# Patient Record
Sex: Female | Born: 1945
Health system: Southern US, Community
[De-identification: ages and names within clinical notes are randomized; demographics above are authoritative.]

## PROBLEM LIST (undated history)

## (undated) DIAGNOSIS — F32A Depression, unspecified: Secondary | ICD-10-CM

## (undated) DIAGNOSIS — F419 Anxiety disorder, unspecified: Secondary | ICD-10-CM

## (undated) DIAGNOSIS — E039 Hypothyroidism, unspecified: Secondary | ICD-10-CM

## (undated) DIAGNOSIS — F329 Major depressive disorder, single episode, unspecified: Secondary | ICD-10-CM

## (undated) HISTORY — DX: Major depressive disorder, single episode, unspecified: F32.9

## (undated) HISTORY — DX: Hypothyroidism, unspecified: E03.9

## (undated) HISTORY — DX: Depression, unspecified: F32.A

## (undated) HISTORY — DX: Anxiety disorder, unspecified: F41.9

---

## 2000-09-07 ENCOUNTER — Encounter: Admission: RE | Admit: 2000-09-07 | Discharge: 2000-09-07 | Payer: Self-pay | Admitting: *Deleted

## 2000-09-07 ENCOUNTER — Encounter: Payer: Self-pay | Admitting: *Deleted

## 2000-09-15 ENCOUNTER — Encounter: Payer: Self-pay | Admitting: General Surgery

## 2000-09-16 ENCOUNTER — Encounter: Payer: Self-pay | Admitting: General Surgery

## 2000-09-16 ENCOUNTER — Observation Stay (HOSPITAL_COMMUNITY): Admission: RE | Admit: 2000-09-16 | Discharge: 2000-09-17 | Payer: Self-pay | Admitting: General Surgery

## 2000-09-19 ENCOUNTER — Ambulatory Visit (HOSPITAL_COMMUNITY): Admission: RE | Admit: 2000-09-19 | Discharge: 2000-09-19 | Payer: Self-pay | Admitting: Gastroenterology

## 2000-09-19 ENCOUNTER — Encounter: Payer: Self-pay | Admitting: Gastroenterology

## 2004-05-29 ENCOUNTER — Ambulatory Visit: Payer: Self-pay | Admitting: Family Medicine

## 2004-07-08 ENCOUNTER — Ambulatory Visit: Payer: Self-pay | Admitting: Family Medicine

## 2013-10-20 ENCOUNTER — Emergency Department (HOSPITAL_COMMUNITY): Payer: Medicare PPO

## 2013-10-20 ENCOUNTER — Encounter (HOSPITAL_COMMUNITY): Payer: Self-pay | Admitting: Emergency Medicine

## 2013-10-20 ENCOUNTER — Emergency Department (HOSPITAL_COMMUNITY)
Admission: EM | Admit: 2013-10-20 | Discharge: 2013-10-20 | Disposition: A | Discharge status: Home / Self Care | Payer: Medicare PPO | Attending: Emergency Medicine | Admitting: Emergency Medicine

## 2013-10-20 DIAGNOSIS — Y9389 Activity, other specified: Secondary | ICD-10-CM | POA: Insufficient documentation

## 2013-10-20 DIAGNOSIS — Z7982 Long term (current) use of aspirin: Secondary | ICD-10-CM | POA: Insufficient documentation

## 2013-10-20 DIAGNOSIS — S298XXA Other specified injuries of thorax, initial encounter: Secondary | ICD-10-CM | POA: Insufficient documentation

## 2013-10-20 DIAGNOSIS — S32509A Unspecified fracture of unspecified pubis, initial encounter for closed fracture: Secondary | ICD-10-CM | POA: Insufficient documentation

## 2013-10-20 DIAGNOSIS — Y92410 Unspecified street and highway as the place of occurrence of the external cause: Secondary | ICD-10-CM | POA: Insufficient documentation

## 2013-10-20 DIAGNOSIS — S32599A Other specified fracture of unspecified pubis, initial encounter for closed fracture: Secondary | ICD-10-CM

## 2013-10-20 DIAGNOSIS — Z79899 Other long term (current) drug therapy: Secondary | ICD-10-CM | POA: Insufficient documentation

## 2013-10-20 DIAGNOSIS — R269 Unspecified abnormalities of gait and mobility: Secondary | ICD-10-CM | POA: Insufficient documentation

## 2013-10-20 MED ORDER — DOCUSATE SODIUM 100 MG PO CAPS: 100.0000 mg | ORAL_CAPSULE | Freq: Two times a day (BID) | ORAL | Status: DC | PRN

## 2013-10-20 MED ORDER — HYDROCODONE-ACETAMINOPHEN 5-325 MG PO TABS: 2.0000 | ORAL_TABLET | ORAL | Status: DC | PRN

## 2013-10-20 MED ORDER — ONDANSETRON 4 MG PO TBDP: 4.0000 mg | ORAL_TABLET | Freq: Three times a day (TID) | ORAL | Status: DC | PRN

## 2013-10-20 MED ORDER — HYDROCODONE-ACETAMINOPHEN 5-325 MG PO TABS
1.0000 | ORAL_TABLET | Freq: Once | ORAL | Status: AC
  Administered 2013-10-20: 1 via ORAL
  Filled 2013-10-20: qty 1

## 2013-10-20 MED ORDER — ONDANSETRON 4 MG PO TBDP
4.0000 mg | ORAL_TABLET | Freq: Once | ORAL | Status: AC
  Administered 2013-10-20: 4 mg via ORAL
  Filled 2013-10-20: qty 1
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
## 2013-10-20 NOTE — ED Notes (Signed)
Wound on R knee dressed with bacitracin, telfa gauze and band aid.

## 2013-10-20 NOTE — Discharge Instructions (Signed)
Stable Pelvic Fracture, Adult °You have one or more fractures (this means there is a break in the bones) of the pelvis. The pelvis is the ring of bones that make up your hipbones. These are the bones you sit on and the lower part of the spine. It is like a boney ring where your legs attach and which supports your upper body. You have an un-displaced fracture. This means the bones are in good position. The pelvic fracture you have is a simple (uncomplicated) fracture. °DIAGNOSIS  °X-rays usually diagnose these fractures. °TREATMENT  °The goals of treating pelvic fractures are to get the bones to heal in a good position. The patient should return to normal activities as soon as possible. Such fractures are often treated with normal bed rest and conservative measures.  °HOME CARE INSTRUCTIONS  °· You should be on bed rest for as long as directed by your caregiver. Change positions of your legs every 1-2 hours to maintain good blood flow. You may sit as long as is tolerable. Following this, you may do usual activities, but avoid strenuous activities for as long as directed by your caregiver. °· Only take over-the-counter or prescription medicines for pain, discomfort, or fever as directed by your caregiver. °· Bed-rest may also be used for discomfort. °· Resume your activities when you are able. Use a cain or crutch on the injured side to reduce pain while walking, as needed. °· If you develop increased pain or discomfort not relieved with medications, contact your caregiver. °· Warning: Do not drive a car or operate a motor vehicle until your caregiver specifically tells you it is safe to do so. °SEEK IMMEDIATE MEDICAL CARE IF:  °· You feel light-headed or faint, develop chest pain or shortness of breath. °· An unexplained oral temperature above 102° F (38.9° C) develops. °· You develop blood in the urine or in the stools. °· There is difficulty urinating, and/or having a bowel movement, or pain with these  efforts. °· There is a difficulty or increased pain with walking. °· There is swelling in one or both legs that is not normal. °Document Released: 11/15/2001 Document Revised: 05/09/2013 Document Reviewed: 04/19/2008 °Exit[employer]® Patient Information ©2014 [employer]. ° °

## 2013-10-20 NOTE — ED Notes (Signed)
Arrangements being made for pt to have walker, BSC, and wheelchair from home health equipment.
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
## 2013-10-20 NOTE — Care Management (Signed)
Patient has [employer] [place] with patients Daughter to let her know that [facility] is out of Network with the Patients insurance for DME but we could still provide it would just be at a higher co-pay. Patients Daughter will discuss this with Patient and  with ED CM whether to proceed with [facility] or to send the Referral to [facility] who holds the [employer]. [facility] fax number was provided if patient Chose's our DME Services. Home Health orders have been received and [facility] will provide.

## 2013-11-30 ENCOUNTER — Ambulatory Visit
Admission: RE | Admit: 2013-11-30 | Discharge: 2013-11-30 | Disposition: A | Discharge status: Home / Self Care | Payer: Commercial Managed Care - HMO | Source: Ambulatory Visit | Attending: Family Medicine | Admitting: Family Medicine

## 2013-11-30 ENCOUNTER — Other Ambulatory Visit: Payer: Self-pay | Admitting: Family Medicine

## 2013-11-30 DIAGNOSIS — S329XXA Fracture of unspecified parts of lumbosacral spine and pelvis, initial encounter for closed fracture: Secondary | ICD-10-CM

## 2013-12-27 ENCOUNTER — Ambulatory Visit
Admission: RE | Admit: 2013-12-27 | Discharge: 2013-12-27 | Disposition: A | Discharge status: Home / Self Care | Payer: Commercial Managed Care - HMO | Source: Ambulatory Visit | Attending: Family Medicine | Admitting: Family Medicine

## 2013-12-27 ENCOUNTER — Other Ambulatory Visit: Payer: Self-pay | Admitting: Family Medicine

## 2013-12-27 DIAGNOSIS — R071 Chest pain on breathing: Secondary | ICD-10-CM

## 2013-12-27 DIAGNOSIS — S329XXA Fracture of unspecified parts of lumbosacral spine and pelvis, initial encounter for closed fracture: Secondary | ICD-10-CM

## 2014-09-23 DIAGNOSIS — T7840XA Allergy, unspecified, initial encounter: Secondary | ICD-10-CM | POA: Diagnosis not present

## 2014-09-24 DIAGNOSIS — T7840XD Allergy, unspecified, subsequent encounter: Secondary | ICD-10-CM | POA: Diagnosis not present

## 2014-10-18 DIAGNOSIS — H2513 Age-related nuclear cataract, bilateral: Secondary | ICD-10-CM | POA: Diagnosis not present

## 2014-10-18 DIAGNOSIS — H04123 Dry eye syndrome of bilateral lacrimal glands: Secondary | ICD-10-CM | POA: Diagnosis not present

## 2014-10-18 DIAGNOSIS — H04532 Neonatal obstruction of left nasolacrimal duct: Secondary | ICD-10-CM | POA: Diagnosis not present

## 2014-10-18 DIAGNOSIS — H04531 Neonatal obstruction of right nasolacrimal duct: Secondary | ICD-10-CM | POA: Diagnosis not present

## 2015-01-24 DIAGNOSIS — R739 Hyperglycemia, unspecified: Secondary | ICD-10-CM | POA: Diagnosis not present

## 2015-01-24 DIAGNOSIS — H578 Other specified disorders of eye and adnexa: Secondary | ICD-10-CM | POA: Diagnosis not present

## 2015-01-24 DIAGNOSIS — M25561 Pain in right knee: Secondary | ICD-10-CM | POA: Diagnosis not present

## 2015-01-24 DIAGNOSIS — J0101 Acute recurrent maxillary sinusitis: Secondary | ICD-10-CM | POA: Diagnosis not present

## 2015-01-24 DIAGNOSIS — J309 Allergic rhinitis, unspecified: Secondary | ICD-10-CM | POA: Diagnosis not present

## 2015-01-24 DIAGNOSIS — F419 Anxiety disorder, unspecified: Secondary | ICD-10-CM | POA: Diagnosis not present

## 2015-01-24 DIAGNOSIS — E785 Hyperlipidemia, unspecified: Secondary | ICD-10-CM | POA: Diagnosis not present

## 2015-01-24 DIAGNOSIS — E039 Hypothyroidism, unspecified: Secondary | ICD-10-CM | POA: Diagnosis not present

## 2015-04-10 ENCOUNTER — Ambulatory Visit (INDEPENDENT_AMBULATORY_CARE_PROVIDER_SITE_OTHER): Payer: Worker's Compensation | Admitting: Family Medicine

## 2015-04-10 ENCOUNTER — Ambulatory Visit: Payer: Worker's Compensation

## 2015-04-10 VITALS — BP 122/74 | HR 74 | Temp 97.6°F | Resp 17 | Ht 64.0 in | Wt 140.0 lb

## 2015-04-10 DIAGNOSIS — M25531 Pain in right wrist: Secondary | ICD-10-CM | POA: Diagnosis not present

## 2015-04-10 DIAGNOSIS — W19XXXA Unspecified fall, initial encounter: Secondary | ICD-10-CM

## 2015-04-10 DIAGNOSIS — M25511 Pain in right shoulder: Secondary | ICD-10-CM

## 2015-04-10 MED ORDER — NAPROXEN 500 MG PO TBEC: 500.0000 mg | DELAYED_RELEASE_TABLET | Freq: Two times a day (BID) | ORAL | Status: DC
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
## 2015-04-10 NOTE — Patient Instructions (Signed)
Apply ice for about 15 or 20 minutes for 5 times daily to wrist and shoulder for the next 3 or 4 days  Return Monday for recheck  Out of work until you are rechecked  Naproxen 500 mg twice daily for pain and inflammation  In addition to that you can take Tylenol (acetaminophen) 500 mg 2 tablets 3 times daily if needed for pain  Avoid lifting and straining, can take brace is off when showering but try to leave them on most of the time.

## 2015-04-14 ENCOUNTER — Ambulatory Visit (INDEPENDENT_AMBULATORY_CARE_PROVIDER_SITE_OTHER): Payer: Worker's Compensation | Admitting: Family Medicine

## 2015-04-14 VITALS — BP 124/76 | HR 71 | Temp 97.6°F | Resp 18 | Ht 63.0 in | Wt 142.4 lb

## 2015-04-14 DIAGNOSIS — M25531 Pain in right wrist: Secondary | ICD-10-CM

## 2015-04-14 DIAGNOSIS — S63502D Unspecified sprain of left wrist, subsequent encounter: Secondary | ICD-10-CM

## 2015-04-14 DIAGNOSIS — S46912D Strain of unspecified muscle, fascia and tendon at shoulder and upper arm level, left arm, subsequent encounter: Secondary | ICD-10-CM

## 2015-04-14 NOTE — Patient Instructions (Signed)
Continue wearing the splint  Avoid lifting and stressful rotation of the right wrist.  Take it out of the splint and do some gentle stretches of it several times a day. Referral is being made for physical therapy, but it sometimes takes a little while under [employer] do get such a referral completed.  If light duty is available patient may return to work in a task using her left hand. It sounds like she does not feel like such is going to be available from her employer, and if that is okay she will need to be left off of work.  Discontinue the naproxen  Take Tylenol 1000 mg (2500 mg) 3 times daily if needed for pain  Return in about 5 days for a recheck yet again. Hopefully it will be well enough to return to regular duty by that time.

## 2015-04-14 NOTE — Progress Notes (Signed)
  Subjective:  Patient ID: Donetta Potts, female    DOB: [birth year]  Age: 69 y.o. MRN: [id number]  69 year old lady who is here a few days ago after a fall. Continues to have pain in the right wrist and forearm. A pronation type motion hurts her from the wrist up into the forearm. 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 The pouring motion is painful to her, as is the lifting. She is very anxious about [place]. The shoulder is doing much better.  Patient did not tolerate anti-inflammatory medications which caused her a great deal of dyspepsia.   Objective:   Shoulder is not painful. The right wrist has tenderness along the joint line and in the wrist ligaments. Flexion or pronation of the wrist is painful. Grip is diminished due to the discomfort. Neurovascular intact. X-ray reports from last week were reviewed. The shoulder is normal. The wrist has a little calcification of the cartilage from old degenerative changes, no acute injuries.  Assessment & Plan:   Assessment: Right wrist pain and strain Right shoulder strain resolved  Plan: We'll proceed on to making a referral for physical therapy. They may need to do some strengthening of that wrist. Discontinue the naproxen. Tylenol for pain. Patient Instructions  Continue wearing the splint  Avoid lifting and stressful rotation of the right wrist.  Take it out of the splint and do some gentle stretches of it several times a day. Referral is being made for physical therapy, but it sometimes takes a little while under [employer] do get such a referral completed.  If light duty is available patient may return to work in a task using her left hand. It sounds like she does not feel like such is going to be available from her employer, and if that is okay she will need to be left off of work.  Discontinue the naproxen  Take Tylenol 1000 mg (2500  mg) 3 times daily if needed for pain  Return in about 5 days for a recheck yet again. Hopefully it will be well enough to return to regular duty by that time.     HOPPER,DAVID, MD 04/14/2015

## 2015-04-19 ENCOUNTER — Ambulatory Visit (INDEPENDENT_AMBULATORY_CARE_PROVIDER_SITE_OTHER): Payer: Worker's Compensation | Admitting: Family Medicine

## 2015-04-19 VITALS — BP 166/76 | HR 85 | Temp 98.3°F | Resp 16 | Ht 63.5 in | Wt 138.0 lb

## 2015-04-19 DIAGNOSIS — S40011D Contusion of right shoulder, subsequent encounter: Secondary | ICD-10-CM

## 2015-04-19 DIAGNOSIS — S60211D Contusion of right wrist, subsequent encounter: Secondary | ICD-10-CM | POA: Diagnosis not present

## 2015-04-19 DIAGNOSIS — Y990 Civilian activity done for income or pay: Secondary | ICD-10-CM | POA: Diagnosis not present
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
## 2015-05-02 DIAGNOSIS — H2513 Age-related nuclear cataract, bilateral: Secondary | ICD-10-CM | POA: Diagnosis not present

## 2015-05-02 DIAGNOSIS — H01022 Squamous blepharitis right lower eyelid: Secondary | ICD-10-CM | POA: Diagnosis not present

## 2015-05-02 DIAGNOSIS — H01025 Squamous blepharitis left lower eyelid: Secondary | ICD-10-CM | POA: Diagnosis not present

## 2015-05-02 DIAGNOSIS — H01024 Squamous blepharitis left upper eyelid: Secondary | ICD-10-CM | POA: Diagnosis not present

## 2015-05-02 DIAGNOSIS — H04123 Dry eye syndrome of bilateral lacrimal glands: Secondary | ICD-10-CM | POA: Diagnosis not present

## 2015-05-02 DIAGNOSIS — H01021 Squamous blepharitis right upper eyelid: Secondary | ICD-10-CM | POA: Diagnosis not present

## 2015-08-01 DIAGNOSIS — E785 Hyperlipidemia, unspecified: Secondary | ICD-10-CM | POA: Diagnosis not present

## 2015-08-01 DIAGNOSIS — J309 Allergic rhinitis, unspecified: Secondary | ICD-10-CM | POA: Diagnosis not present

## 2015-08-01 DIAGNOSIS — R739 Hyperglycemia, unspecified: Secondary | ICD-10-CM | POA: Diagnosis not present

## 2015-08-01 DIAGNOSIS — E039 Hypothyroidism, unspecified: Secondary | ICD-10-CM | POA: Diagnosis not present

## 2015-08-01 DIAGNOSIS — F419 Anxiety disorder, unspecified: Secondary | ICD-10-CM | POA: Diagnosis not present

## 2015-08-01 DIAGNOSIS — Z23 Encounter for immunization: Secondary | ICD-10-CM | POA: Diagnosis not present

## 2015-10-01 ENCOUNTER — Other Ambulatory Visit: Payer: Self-pay | Admitting: Family Medicine

## 2015-10-01 DIAGNOSIS — M79605 Pain in left leg: Secondary | ICD-10-CM | POA: Diagnosis not present

## 2015-10-02 ENCOUNTER — Ambulatory Visit
Admission: RE | Admit: 2015-10-02 | Discharge: 2015-10-02 | Disposition: A | Discharge status: Home / Self Care | Payer: Commercial Managed Care - HMO | Source: Ambulatory Visit | Attending: Family Medicine | Admitting: Family Medicine

## 2015-10-02 DIAGNOSIS — M25562 Pain in left knee: Secondary | ICD-10-CM | POA: Diagnosis not present

## 2015-10-02 DIAGNOSIS — M79605 Pain in left leg: Secondary | ICD-10-CM

## 2015-11-13 DIAGNOSIS — J019 Acute sinusitis, unspecified: Secondary | ICD-10-CM | POA: Diagnosis not present

## 2016-03-05 DIAGNOSIS — F419 Anxiety disorder, unspecified: Secondary | ICD-10-CM | POA: Diagnosis not present

## 2016-03-05 DIAGNOSIS — M79604 Pain in right leg: Secondary | ICD-10-CM | POA: Diagnosis not present

## 2016-03-05 DIAGNOSIS — R7301 Impaired fasting glucose: Secondary | ICD-10-CM | POA: Diagnosis not present

## 2016-03-05 DIAGNOSIS — M79605 Pain in left leg: Secondary | ICD-10-CM | POA: Diagnosis not present

## 2016-03-05 DIAGNOSIS — J309 Allergic rhinitis, unspecified: Secondary | ICD-10-CM | POA: Diagnosis not present

## 2016-03-05 DIAGNOSIS — E7800 Pure hypercholesterolemia, unspecified: Secondary | ICD-10-CM | POA: Diagnosis not present

## 2016-03-05 DIAGNOSIS — B379 Candidiasis, unspecified: Secondary | ICD-10-CM | POA: Diagnosis not present

## 2016-03-05 DIAGNOSIS — E039 Hypothyroidism, unspecified: Secondary | ICD-10-CM | POA: Diagnosis not present

## 2016-04-20 IMAGING — US US EXTREM LOW VENOUS*L*
1 series · 13 of 24 positions shown · non-contrast
Comparison: None.

CLINICAL DATA: Left popliteal region pain.



[Series 1: us extrem low venous*left* · 13 of 30 slices shown]
[im 1/30]
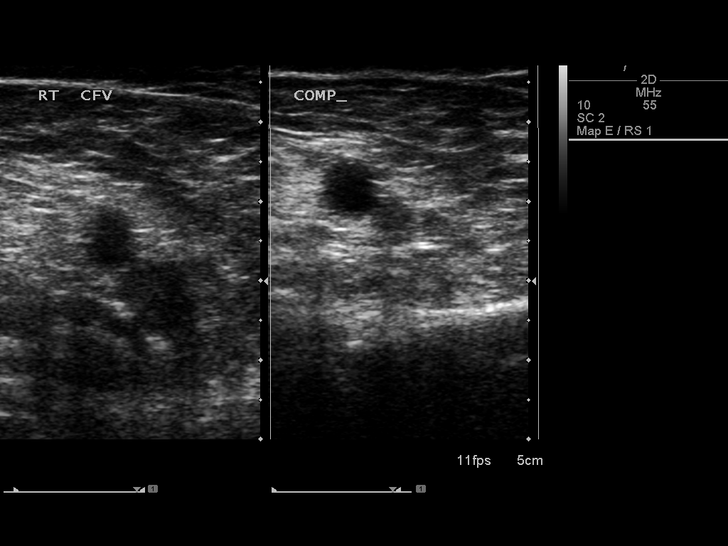
[im 3/30]
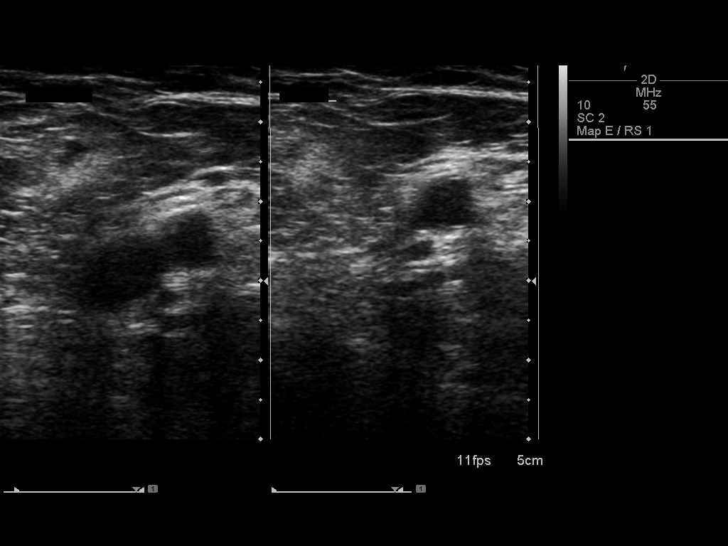
[im 6/30]
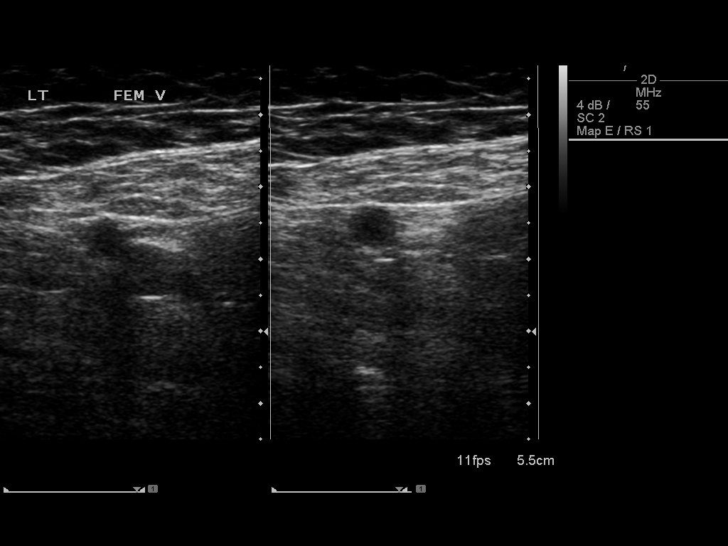
[im 8/30]
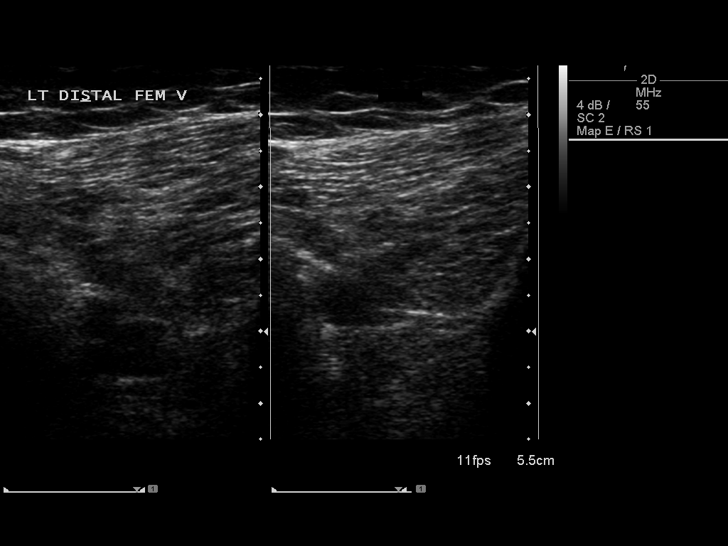
[im 11/30]
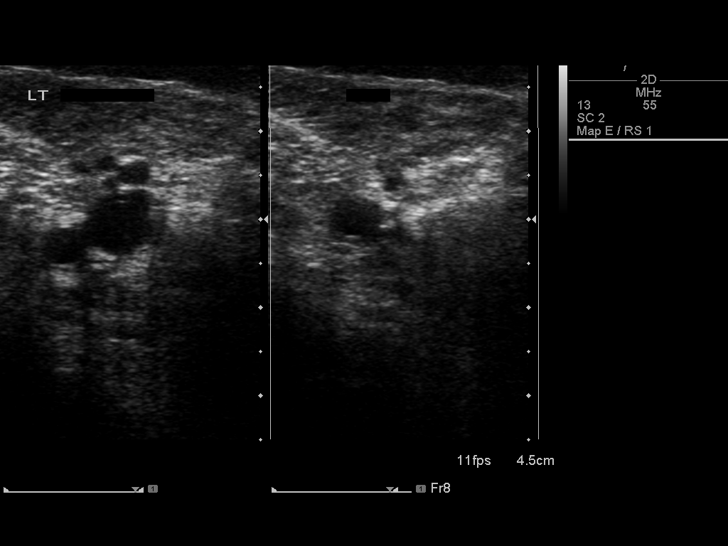
[im 13/30]
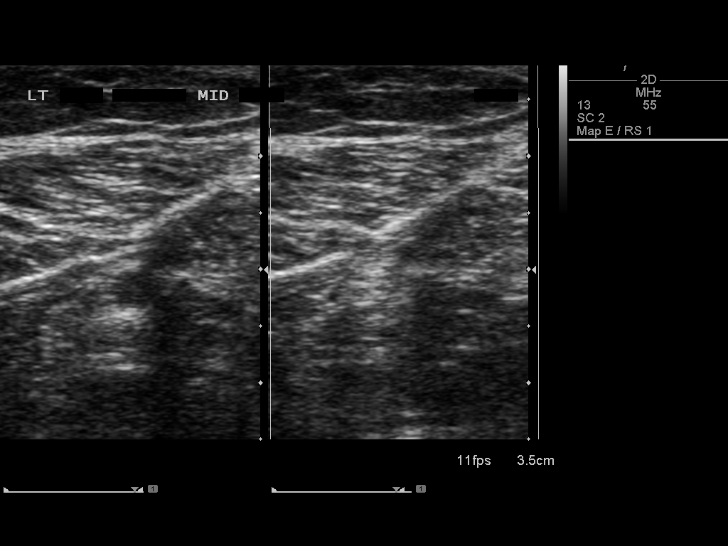
[im 16/30]
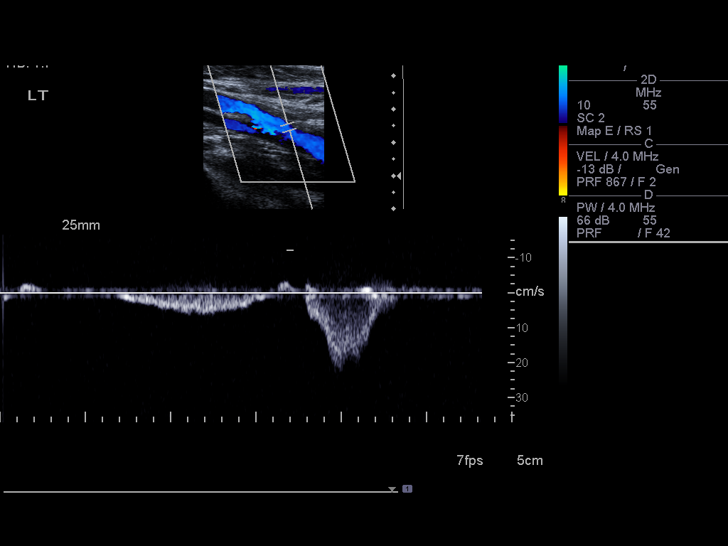
[im 17/30]
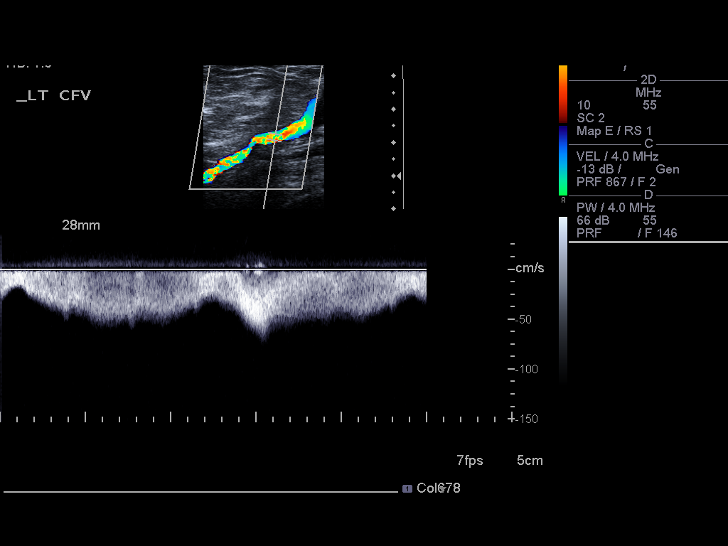
[im 19/30]
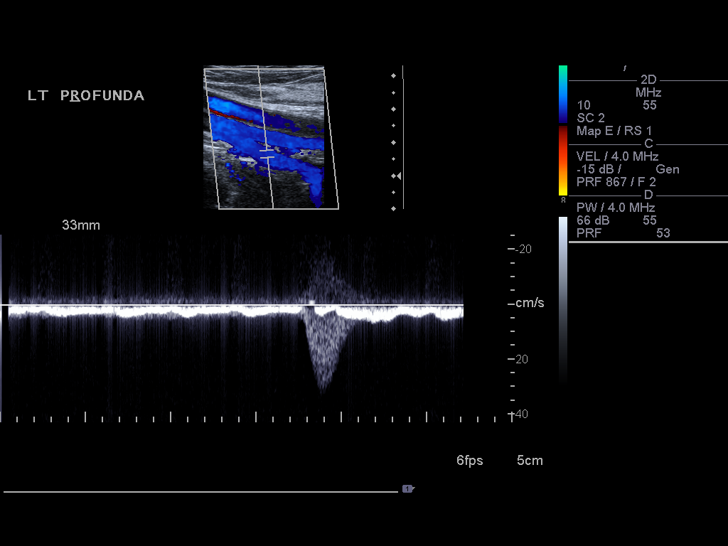
[im 22/30]
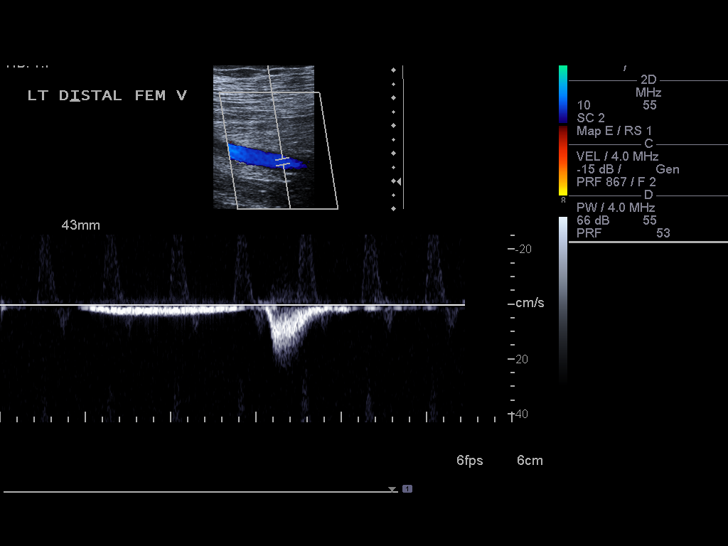
[im 24/30]
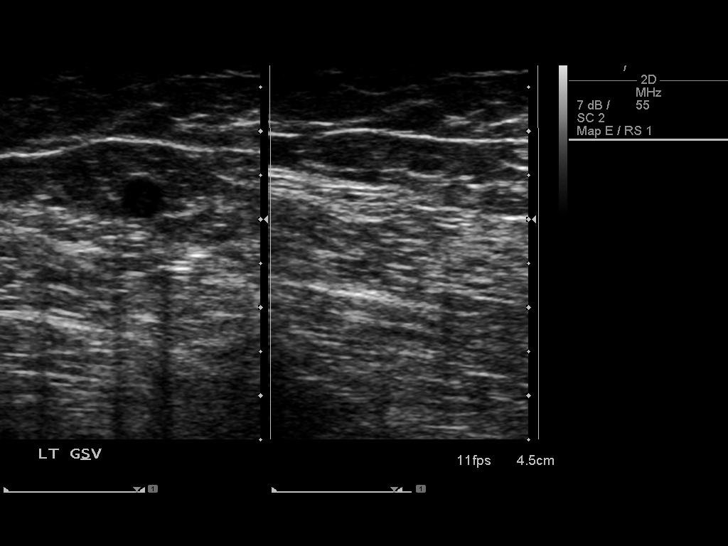
[im 27/30]
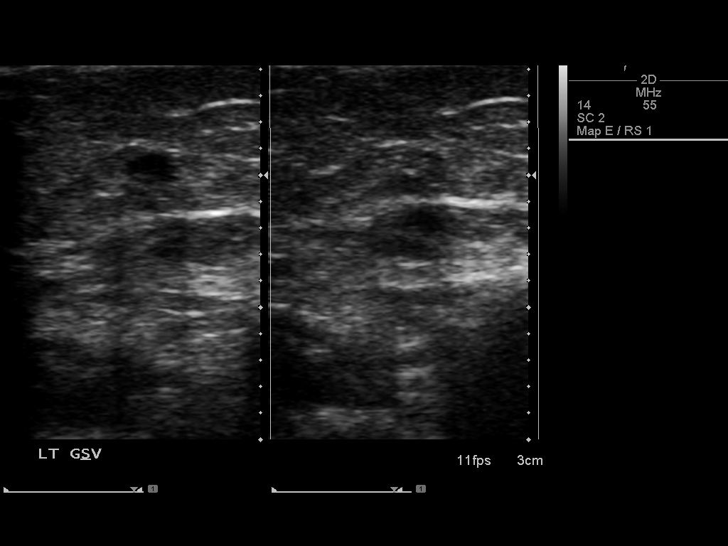
[im 30/30]
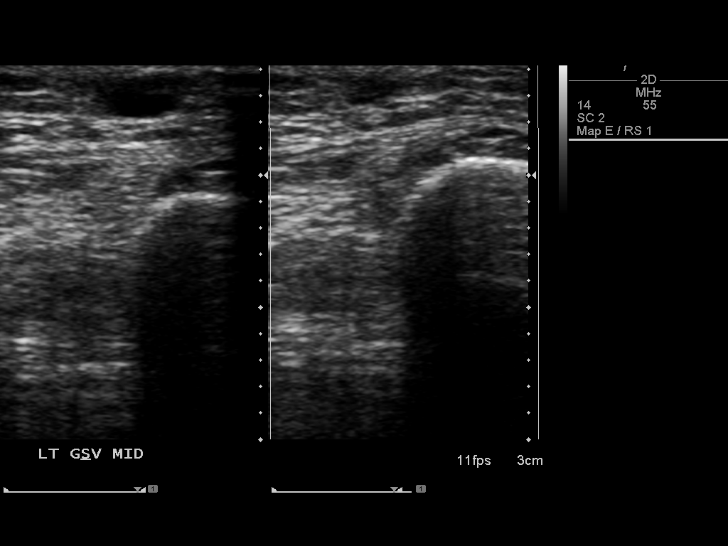

[13 of 24 positions shown; findings below may reference images not displayed]

FINDINGS: Contralateral Common Femoral Vein: Respiratory phasicity is normal
and symmetric with the symptomatic side. No evidence of thrombus.
Normal compressibility.

Common Femoral Vein: No evidence of thrombus. Normal
compressibility, respiratory phasicity and response to augmentation.

Saphenofemoral Junction: No evidence of thrombus. Normal
compressibility and flow on color Doppler imaging.

Profunda Femoral Vein: No evidence of thrombus. Normal
compressibility and flow on color Doppler imaging.

Femoral Vein: No evidence of thrombus. Normal compressibility,
respiratory phasicity and response to augmentation.

Popliteal Vein: No evidence of thrombus. Normal compressibility,
respiratory phasicity and response to augmentation.

Calf Veins: No evidence of thrombus. Normal compressibility and flow
on color Doppler imaging.

Superficial Great Saphenous Vein: No evidence of thrombus. Normal
compressibility and flow on color Doppler imaging.

Venous Reflux:  None.

Other Findings: No evidence of superficial thrombophlebitis or
abnormal fluid collection.
IMPRESSION: No evidence of left lower extremity deep venous thrombosis.

## 2016-05-19 DIAGNOSIS — N9089 Other specified noninflammatory disorders of vulva and perineum: Secondary | ICD-10-CM | POA: Diagnosis not present

## 2016-05-20 DIAGNOSIS — H40023 Open angle with borderline findings, high risk, bilateral: Secondary | ICD-10-CM | POA: Diagnosis not present

## 2016-05-20 DIAGNOSIS — H40053 Ocular hypertension, bilateral: Secondary | ICD-10-CM | POA: Diagnosis not present

## 2016-07-01 DIAGNOSIS — H40023 Open angle with borderline findings, high risk, bilateral: Secondary | ICD-10-CM | POA: Diagnosis not present

## 2016-07-01 DIAGNOSIS — Z713 Dietary counseling and surveillance: Secondary | ICD-10-CM | POA: Diagnosis not present

## 2016-07-01 DIAGNOSIS — R030 Elevated blood-pressure reading, without diagnosis of hypertension: Secondary | ICD-10-CM | POA: Diagnosis not present

## 2016-07-01 DIAGNOSIS — Z136 Encounter for screening for cardiovascular disorders: Secondary | ICD-10-CM | POA: Diagnosis not present

## 2016-07-01 DIAGNOSIS — Z23 Encounter for immunization: Secondary | ICD-10-CM | POA: Diagnosis not present

## 2016-07-01 DIAGNOSIS — E785 Hyperlipidemia, unspecified: Secondary | ICD-10-CM | POA: Diagnosis not present

## 2016-07-01 DIAGNOSIS — Z131 Encounter for screening for diabetes mellitus: Secondary | ICD-10-CM | POA: Diagnosis not present

## 2016-07-01 DIAGNOSIS — Z13220 Encounter for screening for lipoid disorders: Secondary | ICD-10-CM | POA: Diagnosis not present

## 2016-09-03 DIAGNOSIS — E039 Hypothyroidism, unspecified: Secondary | ICD-10-CM | POA: Diagnosis not present

## 2016-09-03 DIAGNOSIS — N9089 Other specified noninflammatory disorders of vulva and perineum: Secondary | ICD-10-CM | POA: Diagnosis not present

## 2016-09-03 DIAGNOSIS — H04129 Dry eye syndrome of unspecified lacrimal gland: Secondary | ICD-10-CM | POA: Diagnosis not present

## 2016-09-03 DIAGNOSIS — F419 Anxiety disorder, unspecified: Secondary | ICD-10-CM | POA: Diagnosis not present

## 2016-09-03 DIAGNOSIS — I8393 Asymptomatic varicose veins of bilateral lower extremities: Secondary | ICD-10-CM | POA: Diagnosis not present

## 2016-09-03 DIAGNOSIS — R7301 Impaired fasting glucose: Secondary | ICD-10-CM | POA: Diagnosis not present

## 2016-09-03 DIAGNOSIS — J301 Allergic rhinitis due to pollen: Secondary | ICD-10-CM | POA: Diagnosis not present

## 2016-09-03 DIAGNOSIS — E7800 Pure hypercholesterolemia, unspecified: Secondary | ICD-10-CM | POA: Diagnosis not present

## 2016-09-03 DIAGNOSIS — L820 Inflamed seborrheic keratosis: Secondary | ICD-10-CM | POA: Diagnosis not present

## 2016-09-14 DIAGNOSIS — M5442 Lumbago with sciatica, left side: Secondary | ICD-10-CM | POA: Diagnosis not present

## 2016-09-16 DIAGNOSIS — M5137 Other intervertebral disc degeneration, lumbosacral region: Secondary | ICD-10-CM | POA: Diagnosis not present

## 2016-09-16 DIAGNOSIS — M9902 Segmental and somatic dysfunction of thoracic region: Secondary | ICD-10-CM | POA: Diagnosis not present

## 2016-09-16 DIAGNOSIS — M9905 Segmental and somatic dysfunction of pelvic region: Secondary | ICD-10-CM | POA: Diagnosis not present

## 2016-09-16 DIAGNOSIS — M9903 Segmental and somatic dysfunction of lumbar region: Secondary | ICD-10-CM | POA: Diagnosis not present

## 2016-09-16 DIAGNOSIS — S29012A Strain of muscle and tendon of back wall of thorax, initial encounter: Secondary | ICD-10-CM | POA: Diagnosis not present

## 2016-09-16 DIAGNOSIS — M5432 Sciatica, left side: Secondary | ICD-10-CM | POA: Diagnosis not present

## 2016-09-21 DIAGNOSIS — M5412 Radiculopathy, cervical region: Secondary | ICD-10-CM | POA: Diagnosis not present

## 2016-09-21 DIAGNOSIS — M9901 Segmental and somatic dysfunction of cervical region: Secondary | ICD-10-CM | POA: Diagnosis not present

## 2016-09-21 DIAGNOSIS — M9903 Segmental and somatic dysfunction of lumbar region: Secondary | ICD-10-CM | POA: Diagnosis not present

## 2016-09-21 DIAGNOSIS — M5442 Lumbago with sciatica, left side: Secondary | ICD-10-CM | POA: Diagnosis not present

## 2016-09-22 DIAGNOSIS — M5442 Lumbago with sciatica, left side: Secondary | ICD-10-CM | POA: Diagnosis not present

## 2016-09-22 DIAGNOSIS — M9903 Segmental and somatic dysfunction of lumbar region: Secondary | ICD-10-CM | POA: Diagnosis not present

## 2016-09-22 DIAGNOSIS — M9901 Segmental and somatic dysfunction of cervical region: Secondary | ICD-10-CM | POA: Diagnosis not present

## 2016-09-22 DIAGNOSIS — M5412 Radiculopathy, cervical region: Secondary | ICD-10-CM | POA: Diagnosis not present

## 2016-09-23 DIAGNOSIS — M9901 Segmental and somatic dysfunction of cervical region: Secondary | ICD-10-CM | POA: Diagnosis not present

## 2016-09-23 DIAGNOSIS — M9903 Segmental and somatic dysfunction of lumbar region: Secondary | ICD-10-CM | POA: Diagnosis not present

## 2016-09-23 DIAGNOSIS — M5442 Lumbago with sciatica, left side: Secondary | ICD-10-CM | POA: Diagnosis not present

## 2016-09-23 DIAGNOSIS — M5412 Radiculopathy, cervical region: Secondary | ICD-10-CM | POA: Diagnosis not present

## 2016-09-24 DIAGNOSIS — N952 Postmenopausal atrophic vaginitis: Secondary | ICD-10-CM | POA: Diagnosis not present

## 2016-09-24 DIAGNOSIS — N9089 Other specified noninflammatory disorders of vulva and perineum: Secondary | ICD-10-CM | POA: Diagnosis not present

## 2016-09-24 DIAGNOSIS — R350 Frequency of micturition: Secondary | ICD-10-CM | POA: Diagnosis not present

## 2016-09-27 DIAGNOSIS — M9901 Segmental and somatic dysfunction of cervical region: Secondary | ICD-10-CM | POA: Diagnosis not present

## 2016-09-27 DIAGNOSIS — M9903 Segmental and somatic dysfunction of lumbar region: Secondary | ICD-10-CM | POA: Diagnosis not present

## 2016-09-27 DIAGNOSIS — M5442 Lumbago with sciatica, left side: Secondary | ICD-10-CM | POA: Diagnosis not present

## 2016-09-27 DIAGNOSIS — M5412 Radiculopathy, cervical region: Secondary | ICD-10-CM | POA: Diagnosis not present

## 2016-09-28 DIAGNOSIS — M9901 Segmental and somatic dysfunction of cervical region: Secondary | ICD-10-CM | POA: Diagnosis not present

## 2016-09-28 DIAGNOSIS — M5442 Lumbago with sciatica, left side: Secondary | ICD-10-CM | POA: Diagnosis not present

## 2016-09-28 DIAGNOSIS — M5412 Radiculopathy, cervical region: Secondary | ICD-10-CM | POA: Diagnosis not present

## 2016-09-28 DIAGNOSIS — M9903 Segmental and somatic dysfunction of lumbar region: Secondary | ICD-10-CM | POA: Diagnosis not present

## 2016-09-29 DIAGNOSIS — M5442 Lumbago with sciatica, left side: Secondary | ICD-10-CM | POA: Diagnosis not present

## 2016-09-29 DIAGNOSIS — M5412 Radiculopathy, cervical region: Secondary | ICD-10-CM | POA: Diagnosis not present

## 2016-09-29 DIAGNOSIS — M9901 Segmental and somatic dysfunction of cervical region: Secondary | ICD-10-CM | POA: Diagnosis not present

## 2016-09-29 DIAGNOSIS — M9903 Segmental and somatic dysfunction of lumbar region: Secondary | ICD-10-CM | POA: Diagnosis not present

## 2016-10-04 DIAGNOSIS — M5412 Radiculopathy, cervical region: Secondary | ICD-10-CM | POA: Diagnosis not present

## 2016-10-04 DIAGNOSIS — M5442 Lumbago with sciatica, left side: Secondary | ICD-10-CM | POA: Diagnosis not present

## 2016-10-04 DIAGNOSIS — M9903 Segmental and somatic dysfunction of lumbar region: Secondary | ICD-10-CM | POA: Diagnosis not present

## 2016-10-04 DIAGNOSIS — M9901 Segmental and somatic dysfunction of cervical region: Secondary | ICD-10-CM | POA: Diagnosis not present

## 2016-10-11 DIAGNOSIS — M9903 Segmental and somatic dysfunction of lumbar region: Secondary | ICD-10-CM | POA: Diagnosis not present

## 2016-10-11 DIAGNOSIS — M9901 Segmental and somatic dysfunction of cervical region: Secondary | ICD-10-CM | POA: Diagnosis not present

## 2016-10-11 DIAGNOSIS — M5442 Lumbago with sciatica, left side: Secondary | ICD-10-CM | POA: Diagnosis not present

## 2016-10-11 DIAGNOSIS — M5412 Radiculopathy, cervical region: Secondary | ICD-10-CM | POA: Diagnosis not present

## 2016-10-13 DIAGNOSIS — M9903 Segmental and somatic dysfunction of lumbar region: Secondary | ICD-10-CM | POA: Diagnosis not present

## 2016-10-13 DIAGNOSIS — M9901 Segmental and somatic dysfunction of cervical region: Secondary | ICD-10-CM | POA: Diagnosis not present

## 2016-10-13 DIAGNOSIS — M5442 Lumbago with sciatica, left side: Secondary | ICD-10-CM | POA: Diagnosis not present

## 2016-10-13 DIAGNOSIS — M5412 Radiculopathy, cervical region: Secondary | ICD-10-CM | POA: Diagnosis not present

## 2016-10-15 DIAGNOSIS — H01003 Unspecified blepharitis right eye, unspecified eyelid: Secondary | ICD-10-CM | POA: Diagnosis not present

## 2016-10-15 DIAGNOSIS — H2513 Age-related nuclear cataract, bilateral: Secondary | ICD-10-CM | POA: Diagnosis not present

## 2016-10-15 DIAGNOSIS — H04213 Epiphora due to excess lacrimation, bilateral lacrimal glands: Secondary | ICD-10-CM | POA: Diagnosis not present

## 2016-10-15 DIAGNOSIS — H04123 Dry eye syndrome of bilateral lacrimal glands: Secondary | ICD-10-CM | POA: Diagnosis not present

## 2016-10-19 DIAGNOSIS — M5136 Other intervertebral disc degeneration, lumbar region: Secondary | ICD-10-CM | POA: Diagnosis not present

## 2016-10-19 DIAGNOSIS — M5442 Lumbago with sciatica, left side: Secondary | ICD-10-CM | POA: Diagnosis not present

## 2016-10-28 DIAGNOSIS — M5442 Lumbago with sciatica, left side: Secondary | ICD-10-CM | POA: Diagnosis not present

## 2016-11-01 DIAGNOSIS — M5442 Lumbago with sciatica, left side: Secondary | ICD-10-CM | POA: Diagnosis not present

## 2016-11-08 DIAGNOSIS — M5442 Lumbago with sciatica, left side: Secondary | ICD-10-CM | POA: Diagnosis not present

## 2016-11-11 DIAGNOSIS — M5442 Lumbago with sciatica, left side: Secondary | ICD-10-CM | POA: Diagnosis not present

## 2016-11-11 DIAGNOSIS — M5136 Other intervertebral disc degeneration, lumbar region: Secondary | ICD-10-CM | POA: Diagnosis not present

## 2016-11-12 DIAGNOSIS — M5442 Lumbago with sciatica, left side: Secondary | ICD-10-CM | POA: Diagnosis not present

## 2016-11-15 DIAGNOSIS — M5442 Lumbago with sciatica, left side: Secondary | ICD-10-CM | POA: Diagnosis not present

## 2016-11-18 DIAGNOSIS — M5442 Lumbago with sciatica, left side: Secondary | ICD-10-CM | POA: Diagnosis not present

## 2016-11-22 DIAGNOSIS — H40023 Open angle with borderline findings, high risk, bilateral: Secondary | ICD-10-CM | POA: Diagnosis not present

## 2016-11-22 DIAGNOSIS — H40053 Ocular hypertension, bilateral: Secondary | ICD-10-CM | POA: Diagnosis not present

## 2016-12-10 DIAGNOSIS — E039 Hypothyroidism, unspecified: Secondary | ICD-10-CM | POA: Diagnosis not present

## 2016-12-28 DIAGNOSIS — M5136 Other intervertebral disc degeneration, lumbar region: Secondary | ICD-10-CM | POA: Diagnosis not present

## 2016-12-28 DIAGNOSIS — M5442 Lumbago with sciatica, left side: Secondary | ICD-10-CM | POA: Diagnosis not present

## 2017-01-26 DIAGNOSIS — M5136 Other intervertebral disc degeneration, lumbar region: Secondary | ICD-10-CM | POA: Diagnosis not present

## 2017-02-18 DIAGNOSIS — E039 Hypothyroidism, unspecified: Secondary | ICD-10-CM | POA: Diagnosis not present

## 2017-02-18 DIAGNOSIS — E7800 Pure hypercholesterolemia, unspecified: Secondary | ICD-10-CM | POA: Diagnosis not present

## 2017-02-18 DIAGNOSIS — M25551 Pain in right hip: Secondary | ICD-10-CM | POA: Diagnosis not present

## 2017-02-18 DIAGNOSIS — R7303 Prediabetes: Secondary | ICD-10-CM | POA: Diagnosis not present

## 2017-02-18 DIAGNOSIS — F419 Anxiety disorder, unspecified: Secondary | ICD-10-CM | POA: Diagnosis not present

## 2017-02-18 DIAGNOSIS — H04129 Dry eye syndrome of unspecified lacrimal gland: Secondary | ICD-10-CM | POA: Diagnosis not present

## 2017-03-04 DIAGNOSIS — M5136 Other intervertebral disc degeneration, lumbar region: Secondary | ICD-10-CM | POA: Diagnosis not present

## 2017-03-10 DIAGNOSIS — H04123 Dry eye syndrome of bilateral lacrimal glands: Secondary | ICD-10-CM | POA: Diagnosis not present

## 2017-03-11 DIAGNOSIS — M9904 Segmental and somatic dysfunction of sacral region: Secondary | ICD-10-CM | POA: Diagnosis not present

## 2017-03-11 DIAGNOSIS — M5432 Sciatica, left side: Secondary | ICD-10-CM | POA: Diagnosis not present

## 2017-03-11 DIAGNOSIS — M9905 Segmental and somatic dysfunction of pelvic region: Secondary | ICD-10-CM | POA: Diagnosis not present

## 2017-03-11 DIAGNOSIS — H04321 Acute dacryocystitis of right lacrimal passage: Secondary | ICD-10-CM | POA: Diagnosis not present

## 2017-03-11 DIAGNOSIS — H40013 Open angle with borderline findings, low risk, bilateral: Secondary | ICD-10-CM | POA: Diagnosis not present

## 2017-03-11 DIAGNOSIS — M9903 Segmental and somatic dysfunction of lumbar region: Secondary | ICD-10-CM | POA: Diagnosis not present

## 2017-03-18 DIAGNOSIS — H04321 Acute dacryocystitis of right lacrimal passage: Secondary | ICD-10-CM | POA: Diagnosis not present

## 2017-04-04 DIAGNOSIS — H04411 Chronic dacryocystitis of right lacrimal passage: Secondary | ICD-10-CM | POA: Diagnosis not present

## 2017-04-04 DIAGNOSIS — H04223 Epiphora due to insufficient drainage, bilateral lacrimal glands: Secondary | ICD-10-CM | POA: Diagnosis not present

## 2017-04-04 DIAGNOSIS — H04552 Acquired stenosis of left nasolacrimal duct: Secondary | ICD-10-CM | POA: Diagnosis not present

## 2017-04-04 DIAGNOSIS — H04551 Acquired stenosis of right nasolacrimal duct: Secondary | ICD-10-CM | POA: Diagnosis not present

## 2017-04-04 DIAGNOSIS — H04221 Epiphora due to insufficient drainage, right lacrimal gland: Secondary | ICD-10-CM | POA: Diagnosis not present

## 2017-04-04 DIAGNOSIS — H04412 Chronic dacryocystitis of left lacrimal passage: Secondary | ICD-10-CM | POA: Diagnosis not present

## 2017-04-04 DIAGNOSIS — H04553 Acquired stenosis of bilateral nasolacrimal duct: Secondary | ICD-10-CM | POA: Diagnosis not present

## 2017-04-04 DIAGNOSIS — H04222 Epiphora due to insufficient drainage, left lacrimal gland: Secondary | ICD-10-CM | POA: Diagnosis not present

## 2017-04-08 DIAGNOSIS — H40023 Open angle with borderline findings, high risk, bilateral: Secondary | ICD-10-CM | POA: Diagnosis not present

## 2017-04-08 DIAGNOSIS — H40053 Ocular hypertension, bilateral: Secondary | ICD-10-CM | POA: Diagnosis not present

## 2017-06-14 DIAGNOSIS — H04321 Acute dacryocystitis of right lacrimal passage: Secondary | ICD-10-CM | POA: Diagnosis not present

## 2017-06-16 DIAGNOSIS — H04321 Acute dacryocystitis of right lacrimal passage: Secondary | ICD-10-CM | POA: Diagnosis not present

## 2017-06-23 DIAGNOSIS — H04321 Acute dacryocystitis of right lacrimal passage: Secondary | ICD-10-CM | POA: Diagnosis not present

## 2017-06-30 DIAGNOSIS — H04321 Acute dacryocystitis of right lacrimal passage: Secondary | ICD-10-CM | POA: Diagnosis not present

## 2017-07-19 DIAGNOSIS — Z23 Encounter for immunization: Secondary | ICD-10-CM | POA: Diagnosis not present

## 2017-08-30 DIAGNOSIS — E7800 Pure hypercholesterolemia, unspecified: Secondary | ICD-10-CM | POA: Diagnosis not present

## 2017-08-30 DIAGNOSIS — R238 Other skin changes: Secondary | ICD-10-CM | POA: Diagnosis not present

## 2017-08-30 DIAGNOSIS — M5489 Other dorsalgia: Secondary | ICD-10-CM | POA: Diagnosis not present

## 2017-08-30 DIAGNOSIS — E039 Hypothyroidism, unspecified: Secondary | ICD-10-CM | POA: Diagnosis not present

## 2017-08-30 DIAGNOSIS — R7303 Prediabetes: Secondary | ICD-10-CM | POA: Diagnosis not present

## 2017-08-30 DIAGNOSIS — H04129 Dry eye syndrome of unspecified lacrimal gland: Secondary | ICD-10-CM | POA: Diagnosis not present

## 2017-08-30 DIAGNOSIS — F419 Anxiety disorder, unspecified: Secondary | ICD-10-CM | POA: Diagnosis not present

## 2017-10-06 DIAGNOSIS — H04321 Acute dacryocystitis of right lacrimal passage: Secondary | ICD-10-CM | POA: Diagnosis not present

## 2017-10-07 DIAGNOSIS — H04321 Acute dacryocystitis of right lacrimal passage: Secondary | ICD-10-CM | POA: Diagnosis not present

## 2017-10-25 DIAGNOSIS — H40023 Open angle with borderline findings, high risk, bilateral: Secondary | ICD-10-CM | POA: Diagnosis not present

## 2017-10-25 DIAGNOSIS — H18893 Other specified disorders of cornea, bilateral: Secondary | ICD-10-CM | POA: Diagnosis not present

## 2017-10-25 DIAGNOSIS — H40053 Ocular hypertension, bilateral: Secondary | ICD-10-CM | POA: Diagnosis not present

## 2017-11-08 DIAGNOSIS — D492 Neoplasm of unspecified behavior of bone, soft tissue, and skin: Secondary | ICD-10-CM | POA: Diagnosis not present

## 2017-11-09 DIAGNOSIS — M5442 Lumbago with sciatica, left side: Secondary | ICD-10-CM | POA: Diagnosis not present

## 2017-11-09 DIAGNOSIS — M9903 Segmental and somatic dysfunction of lumbar region: Secondary | ICD-10-CM | POA: Diagnosis not present

## 2017-11-11 DIAGNOSIS — Z189 Retained foreign body fragments, unspecified material: Secondary | ICD-10-CM | POA: Diagnosis not present

## 2017-11-11 DIAGNOSIS — L923 Foreign body granuloma of the skin and subcutaneous tissue: Secondary | ICD-10-CM | POA: Diagnosis not present

## 2017-11-11 DIAGNOSIS — D492 Neoplasm of unspecified behavior of bone, soft tissue, and skin: Secondary | ICD-10-CM | POA: Diagnosis not present

## 2017-11-14 DIAGNOSIS — M5442 Lumbago with sciatica, left side: Secondary | ICD-10-CM | POA: Diagnosis not present

## 2017-11-14 DIAGNOSIS — M9903 Segmental and somatic dysfunction of lumbar region: Secondary | ICD-10-CM | POA: Diagnosis not present

## 2018-01-20 DIAGNOSIS — H04553 Acquired stenosis of bilateral nasolacrimal duct: Secondary | ICD-10-CM | POA: Diagnosis not present

## 2018-01-20 DIAGNOSIS — Z01818 Encounter for other preprocedural examination: Secondary | ICD-10-CM | POA: Diagnosis not present

## 2018-03-03 DIAGNOSIS — M5489 Other dorsalgia: Secondary | ICD-10-CM | POA: Diagnosis not present

## 2018-03-03 DIAGNOSIS — R7303 Prediabetes: Secondary | ICD-10-CM | POA: Diagnosis not present

## 2018-03-03 DIAGNOSIS — F419 Anxiety disorder, unspecified: Secondary | ICD-10-CM | POA: Diagnosis not present

## 2018-03-03 DIAGNOSIS — L820 Inflamed seborrheic keratosis: Secondary | ICD-10-CM | POA: Diagnosis not present

## 2018-03-03 DIAGNOSIS — L821 Other seborrheic keratosis: Secondary | ICD-10-CM | POA: Diagnosis not present

## 2018-03-03 DIAGNOSIS — E7800 Pure hypercholesterolemia, unspecified: Secondary | ICD-10-CM | POA: Diagnosis not present

## 2018-03-03 DIAGNOSIS — E039 Hypothyroidism, unspecified: Secondary | ICD-10-CM | POA: Diagnosis not present

## 2018-05-02 DIAGNOSIS — H00032 Abscess of right lower eyelid: Secondary | ICD-10-CM | POA: Diagnosis not present

## 2018-05-04 DIAGNOSIS — H00032 Abscess of right lower eyelid: Secondary | ICD-10-CM | POA: Diagnosis not present

## 2018-06-02 DIAGNOSIS — H00032 Abscess of right lower eyelid: Secondary | ICD-10-CM | POA: Diagnosis not present

## 2018-06-27 DIAGNOSIS — H04553 Acquired stenosis of bilateral nasolacrimal duct: Secondary | ICD-10-CM | POA: Diagnosis not present

## 2018-06-27 DIAGNOSIS — Z23 Encounter for immunization: Secondary | ICD-10-CM | POA: Diagnosis not present

## 2018-07-17 DIAGNOSIS — H04553 Acquired stenosis of bilateral nasolacrimal duct: Secondary | ICD-10-CM | POA: Diagnosis not present

## 2018-07-17 DIAGNOSIS — H5789 Other specified disorders of eye and adnexa: Secondary | ICD-10-CM | POA: Diagnosis not present

## 2018-07-17 DIAGNOSIS — H04551 Acquired stenosis of right nasolacrimal duct: Secondary | ICD-10-CM | POA: Diagnosis not present

## 2018-07-17 DIAGNOSIS — Z01818 Encounter for other preprocedural examination: Secondary | ICD-10-CM | POA: Diagnosis not present

## 2018-07-17 DIAGNOSIS — H04552 Acquired stenosis of left nasolacrimal duct: Secondary | ICD-10-CM | POA: Diagnosis not present

## 2018-08-15 DIAGNOSIS — H01025 Squamous blepharitis left lower eyelid: Secondary | ICD-10-CM | POA: Diagnosis not present

## 2018-08-15 DIAGNOSIS — H43811 Vitreous degeneration, right eye: Secondary | ICD-10-CM | POA: Diagnosis not present

## 2018-08-15 DIAGNOSIS — H2513 Age-related nuclear cataract, bilateral: Secondary | ICD-10-CM | POA: Diagnosis not present

## 2018-08-15 DIAGNOSIS — H01022 Squamous blepharitis right lower eyelid: Secondary | ICD-10-CM | POA: Diagnosis not present

## 2018-08-15 DIAGNOSIS — H01021 Squamous blepharitis right upper eyelid: Secondary | ICD-10-CM | POA: Diagnosis not present

## 2018-08-15 DIAGNOSIS — H01024 Squamous blepharitis left upper eyelid: Secondary | ICD-10-CM | POA: Diagnosis not present

## 2018-08-29 DIAGNOSIS — E7800 Pure hypercholesterolemia, unspecified: Secondary | ICD-10-CM | POA: Diagnosis not present

## 2018-08-29 DIAGNOSIS — R7303 Prediabetes: Secondary | ICD-10-CM | POA: Diagnosis not present

## 2018-08-29 DIAGNOSIS — F419 Anxiety disorder, unspecified: Secondary | ICD-10-CM | POA: Diagnosis not present

## 2018-08-29 DIAGNOSIS — E039 Hypothyroidism, unspecified: Secondary | ICD-10-CM | POA: Diagnosis not present

## 2018-08-29 DIAGNOSIS — M5489 Other dorsalgia: Secondary | ICD-10-CM | POA: Diagnosis not present

## 2018-09-15 DIAGNOSIS — M545 Low back pain: Secondary | ICD-10-CM | POA: Diagnosis not present

## 2018-09-18 DIAGNOSIS — M47816 Spondylosis without myelopathy or radiculopathy, lumbar region: Secondary | ICD-10-CM | POA: Diagnosis not present

## 2018-09-18 DIAGNOSIS — M9903 Segmental and somatic dysfunction of lumbar region: Secondary | ICD-10-CM | POA: Diagnosis not present

## 2018-09-19 DIAGNOSIS — M9903 Segmental and somatic dysfunction of lumbar region: Secondary | ICD-10-CM | POA: Diagnosis not present

## 2018-09-19 DIAGNOSIS — M47816 Spondylosis without myelopathy or radiculopathy, lumbar region: Secondary | ICD-10-CM | POA: Diagnosis not present

## 2018-09-23 DIAGNOSIS — J069 Acute upper respiratory infection, unspecified: Secondary | ICD-10-CM | POA: Diagnosis not present

## 2018-09-26 DIAGNOSIS — M9903 Segmental and somatic dysfunction of lumbar region: Secondary | ICD-10-CM | POA: Diagnosis not present

## 2018-09-26 DIAGNOSIS — M47816 Spondylosis without myelopathy or radiculopathy, lumbar region: Secondary | ICD-10-CM | POA: Diagnosis not present

## 2018-10-03 DIAGNOSIS — M47816 Spondylosis without myelopathy or radiculopathy, lumbar region: Secondary | ICD-10-CM | POA: Diagnosis not present

## 2018-10-03 DIAGNOSIS — M9903 Segmental and somatic dysfunction of lumbar region: Secondary | ICD-10-CM | POA: Diagnosis not present

## 2018-10-13 DIAGNOSIS — R300 Dysuria: Secondary | ICD-10-CM | POA: Diagnosis not present

## 2018-10-13 DIAGNOSIS — N390 Urinary tract infection, site not specified: Secondary | ICD-10-CM | POA: Diagnosis not present

## 2018-11-17 DIAGNOSIS — H04301 Unspecified dacryocystitis of right lacrimal passage: Secondary | ICD-10-CM | POA: Diagnosis not present

## 2018-11-28 DIAGNOSIS — H04551 Acquired stenosis of right nasolacrimal duct: Secondary | ICD-10-CM | POA: Diagnosis not present

## 2018-11-28 DIAGNOSIS — H04552 Acquired stenosis of left nasolacrimal duct: Secondary | ICD-10-CM | POA: Diagnosis not present

## 2018-11-28 DIAGNOSIS — H04411 Chronic dacryocystitis of right lacrimal passage: Secondary | ICD-10-CM | POA: Diagnosis not present

## 2018-11-28 DIAGNOSIS — H04221 Epiphora due to insufficient drainage, right lacrimal gland: Secondary | ICD-10-CM | POA: Diagnosis not present

## 2018-11-28 DIAGNOSIS — H04553 Acquired stenosis of bilateral nasolacrimal duct: Secondary | ICD-10-CM | POA: Diagnosis not present

## 2018-11-28 DIAGNOSIS — H04222 Epiphora due to insufficient drainage, left lacrimal gland: Secondary | ICD-10-CM | POA: Diagnosis not present

## 2018-11-28 DIAGNOSIS — H04223 Epiphora due to insufficient drainage, bilateral lacrimal glands: Secondary | ICD-10-CM | POA: Diagnosis not present

## 2018-11-28 DIAGNOSIS — H04129 Dry eye syndrome of unspecified lacrimal gland: Secondary | ICD-10-CM | POA: Diagnosis not present

## 2019-01-15 DIAGNOSIS — M5442 Lumbago with sciatica, left side: Secondary | ICD-10-CM | POA: Diagnosis not present

## 2019-01-15 DIAGNOSIS — M9903 Segmental and somatic dysfunction of lumbar region: Secondary | ICD-10-CM | POA: Diagnosis not present

## 2019-01-16 DIAGNOSIS — M5442 Lumbago with sciatica, left side: Secondary | ICD-10-CM | POA: Diagnosis not present

## 2019-01-16 DIAGNOSIS — M9903 Segmental and somatic dysfunction of lumbar region: Secondary | ICD-10-CM | POA: Diagnosis not present

## 2019-01-22 DIAGNOSIS — M5442 Lumbago with sciatica, left side: Secondary | ICD-10-CM | POA: Diagnosis not present

## 2019-01-22 DIAGNOSIS — M9903 Segmental and somatic dysfunction of lumbar region: Secondary | ICD-10-CM | POA: Diagnosis not present

## 2019-01-31 DIAGNOSIS — K529 Noninfective gastroenteritis and colitis, unspecified: Secondary | ICD-10-CM | POA: Diagnosis not present

## 2019-02-14 DIAGNOSIS — H04223 Epiphora due to insufficient drainage, bilateral lacrimal glands: Secondary | ICD-10-CM | POA: Diagnosis not present

## 2019-02-14 DIAGNOSIS — H04129 Dry eye syndrome of unspecified lacrimal gland: Secondary | ICD-10-CM | POA: Diagnosis not present

## 2019-02-14 DIAGNOSIS — H04551 Acquired stenosis of right nasolacrimal duct: Secondary | ICD-10-CM | POA: Diagnosis not present

## 2019-02-14 DIAGNOSIS — H04222 Epiphora due to insufficient drainage, left lacrimal gland: Secondary | ICD-10-CM | POA: Diagnosis not present

## 2019-02-14 DIAGNOSIS — H04221 Epiphora due to insufficient drainage, right lacrimal gland: Secondary | ICD-10-CM | POA: Diagnosis not present

## 2019-02-14 DIAGNOSIS — H04411 Chronic dacryocystitis of right lacrimal passage: Secondary | ICD-10-CM | POA: Diagnosis not present

## 2019-02-14 DIAGNOSIS — H04553 Acquired stenosis of bilateral nasolacrimal duct: Secondary | ICD-10-CM | POA: Diagnosis not present

## 2019-02-14 DIAGNOSIS — H04552 Acquired stenosis of left nasolacrimal duct: Secondary | ICD-10-CM | POA: Diagnosis not present

## 2019-03-02 DIAGNOSIS — N183 Chronic kidney disease, stage 3 (moderate): Secondary | ICD-10-CM | POA: Diagnosis not present

## 2019-03-02 DIAGNOSIS — M5489 Other dorsalgia: Secondary | ICD-10-CM | POA: Diagnosis not present

## 2019-03-02 DIAGNOSIS — E039 Hypothyroidism, unspecified: Secondary | ICD-10-CM | POA: Diagnosis not present

## 2019-03-02 DIAGNOSIS — E7800 Pure hypercholesterolemia, unspecified: Secondary | ICD-10-CM | POA: Diagnosis not present

## 2019-03-02 DIAGNOSIS — Z1211 Encounter for screening for malignant neoplasm of colon: Secondary | ICD-10-CM | POA: Diagnosis not present

## 2019-03-02 DIAGNOSIS — R7303 Prediabetes: Secondary | ICD-10-CM | POA: Diagnosis not present

## 2019-03-02 DIAGNOSIS — F419 Anxiety disorder, unspecified: Secondary | ICD-10-CM | POA: Diagnosis not present

## 2019-04-10 DIAGNOSIS — H04411 Chronic dacryocystitis of right lacrimal passage: Secondary | ICD-10-CM | POA: Diagnosis not present

## 2019-04-10 DIAGNOSIS — H04301 Unspecified dacryocystitis of right lacrimal passage: Secondary | ICD-10-CM | POA: Diagnosis not present

## 2019-04-10 DIAGNOSIS — H04129 Dry eye syndrome of unspecified lacrimal gland: Secondary | ICD-10-CM | POA: Diagnosis not present

## 2019-04-10 DIAGNOSIS — H04551 Acquired stenosis of right nasolacrimal duct: Secondary | ICD-10-CM | POA: Diagnosis not present

## 2019-04-10 DIAGNOSIS — H04552 Acquired stenosis of left nasolacrimal duct: Secondary | ICD-10-CM | POA: Diagnosis not present

## 2019-04-10 DIAGNOSIS — H04223 Epiphora due to insufficient drainage, bilateral lacrimal glands: Secondary | ICD-10-CM | POA: Diagnosis not present

## 2019-04-10 DIAGNOSIS — H04553 Acquired stenosis of bilateral nasolacrimal duct: Secondary | ICD-10-CM | POA: Diagnosis not present

## 2019-04-13 DIAGNOSIS — N760 Acute vaginitis: Secondary | ICD-10-CM | POA: Diagnosis not present

## 2019-04-24 DIAGNOSIS — H04553 Acquired stenosis of bilateral nasolacrimal duct: Secondary | ICD-10-CM | POA: Diagnosis not present

## 2019-04-24 DIAGNOSIS — H04129 Dry eye syndrome of unspecified lacrimal gland: Secondary | ICD-10-CM | POA: Diagnosis not present

## 2019-04-24 DIAGNOSIS — H04223 Epiphora due to insufficient drainage, bilateral lacrimal glands: Secondary | ICD-10-CM | POA: Diagnosis not present

## 2019-04-24 DIAGNOSIS — H04411 Chronic dacryocystitis of right lacrimal passage: Secondary | ICD-10-CM | POA: Diagnosis not present

## 2019-05-11 DIAGNOSIS — N9089 Other specified noninflammatory disorders of vulva and perineum: Secondary | ICD-10-CM | POA: Diagnosis not present

## 2019-06-22 DIAGNOSIS — Z23 Encounter for immunization: Secondary | ICD-10-CM | POA: Diagnosis not present

## 2019-06-28 DIAGNOSIS — Z1211 Encounter for screening for malignant neoplasm of colon: Secondary | ICD-10-CM | POA: Diagnosis not present

## 2019-06-29 DIAGNOSIS — B351 Tinea unguium: Secondary | ICD-10-CM | POA: Diagnosis not present

## 2019-07-16 DIAGNOSIS — H04551 Acquired stenosis of right nasolacrimal duct: Secondary | ICD-10-CM | POA: Diagnosis not present

## 2019-07-16 DIAGNOSIS — H04123 Dry eye syndrome of bilateral lacrimal glands: Secondary | ICD-10-CM | POA: Diagnosis not present

## 2019-07-16 DIAGNOSIS — H04221 Epiphora due to insufficient drainage, right lacrimal gland: Secondary | ICD-10-CM | POA: Diagnosis not present

## 2019-07-16 DIAGNOSIS — H04223 Epiphora due to insufficient drainage, bilateral lacrimal glands: Secondary | ICD-10-CM | POA: Diagnosis not present

## 2019-07-16 DIAGNOSIS — H04411 Chronic dacryocystitis of right lacrimal passage: Secondary | ICD-10-CM | POA: Diagnosis not present

## 2019-07-16 DIAGNOSIS — H04553 Acquired stenosis of bilateral nasolacrimal duct: Secondary | ICD-10-CM | POA: Diagnosis not present

## 2019-08-10 DIAGNOSIS — H40023 Open angle with borderline findings, high risk, bilateral: Secondary | ICD-10-CM | POA: Diagnosis not present

## 2019-08-10 DIAGNOSIS — H524 Presbyopia: Secondary | ICD-10-CM | POA: Diagnosis not present

## 2019-08-31 DIAGNOSIS — E7800 Pure hypercholesterolemia, unspecified: Secondary | ICD-10-CM | POA: Diagnosis not present

## 2019-08-31 DIAGNOSIS — L820 Inflamed seborrheic keratosis: Secondary | ICD-10-CM | POA: Diagnosis not present

## 2019-08-31 DIAGNOSIS — E039 Hypothyroidism, unspecified: Secondary | ICD-10-CM | POA: Diagnosis not present

## 2019-08-31 DIAGNOSIS — M2041 Other hammer toe(s) (acquired), right foot: Secondary | ICD-10-CM | POA: Diagnosis not present

## 2019-08-31 DIAGNOSIS — R7303 Prediabetes: Secondary | ICD-10-CM | POA: Diagnosis not present

## 2019-08-31 DIAGNOSIS — F419 Anxiety disorder, unspecified: Secondary | ICD-10-CM | POA: Diagnosis not present

## 2019-10-23 DIAGNOSIS — H04551 Acquired stenosis of right nasolacrimal duct: Secondary | ICD-10-CM | POA: Diagnosis not present

## 2019-10-23 DIAGNOSIS — H04221 Epiphora due to insufficient drainage, right lacrimal gland: Secondary | ICD-10-CM | POA: Diagnosis not present

## 2019-10-23 DIAGNOSIS — H04223 Epiphora due to insufficient drainage, bilateral lacrimal glands: Secondary | ICD-10-CM | POA: Diagnosis not present

## 2019-10-23 DIAGNOSIS — H04123 Dry eye syndrome of bilateral lacrimal glands: Secondary | ICD-10-CM | POA: Diagnosis not present

## 2019-10-23 DIAGNOSIS — H04411 Chronic dacryocystitis of right lacrimal passage: Secondary | ICD-10-CM | POA: Diagnosis not present

## 2019-10-23 DIAGNOSIS — H04553 Acquired stenosis of bilateral nasolacrimal duct: Secondary | ICD-10-CM | POA: Diagnosis not present

## 2019-11-05 ENCOUNTER — Ambulatory Visit: Payer: Medicare HMO | Attending: Internal Medicine

## 2019-11-05 ENCOUNTER — Other Ambulatory Visit: Payer: Self-pay

## 2019-11-05 DIAGNOSIS — Z23 Encounter for immunization: Secondary | ICD-10-CM | POA: Insufficient documentation

## 2019-11-05 NOTE — Progress Notes (Signed)
   Covid-19 Vaccination Clinic  Name:  Kaitlyn Wright    MRN: [id number] DOB: 1946/03/20  11/05/2019  Kaitlyn Wright was observed post Covid-19 immunization for 15 minutes without incidence. She was provided with Vaccine Information Sheet and instruction to access the V-Safe system.   Kaitlyn Wright was instructed to call 911 with any severe reactions post vaccine: Marland Kitchen Difficulty breathing  . Swelling of your face and throat  . A fast heartbeat  . A bad rash all over your body  . Dizziness and weakness    Immunizations Administered    Name Date Dose VIS Date Route   Moderna COVID-19 Vaccine 11/05/2019 12:24 PM 0.5 mL 08/21/2019 Intramuscular   Manufacturer: Moderna   Lot: [id number]   [id number]

## 2019-11-09 DIAGNOSIS — H2513 Age-related nuclear cataract, bilateral: Secondary | ICD-10-CM | POA: Diagnosis not present

## 2019-11-09 DIAGNOSIS — H40053 Ocular hypertension, bilateral: Secondary | ICD-10-CM | POA: Diagnosis not present

## 2019-11-09 DIAGNOSIS — H43813 Vitreous degeneration, bilateral: Secondary | ICD-10-CM | POA: Diagnosis not present

## 2019-11-09 DIAGNOSIS — H04411 Chronic dacryocystitis of right lacrimal passage: Secondary | ICD-10-CM | POA: Diagnosis not present

## 2019-11-09 DIAGNOSIS — H0102B Squamous blepharitis left eye, upper and lower eyelids: Secondary | ICD-10-CM | POA: Diagnosis not present

## 2019-11-09 DIAGNOSIS — H0102A Squamous blepharitis right eye, upper and lower eyelids: Secondary | ICD-10-CM | POA: Diagnosis not present

## 2019-11-13 DIAGNOSIS — M2042 Other hammer toe(s) (acquired), left foot: Secondary | ICD-10-CM | POA: Diagnosis not present

## 2019-11-13 DIAGNOSIS — M21611 Bunion of right foot: Secondary | ICD-10-CM | POA: Diagnosis not present

## 2019-11-13 DIAGNOSIS — M21612 Bunion of left foot: Secondary | ICD-10-CM | POA: Diagnosis not present

## 2019-11-13 DIAGNOSIS — M2041 Other hammer toe(s) (acquired), right foot: Secondary | ICD-10-CM | POA: Diagnosis not present

## 2019-11-16 DIAGNOSIS — M21611 Bunion of right foot: Secondary | ICD-10-CM | POA: Diagnosis not present

## 2019-11-16 DIAGNOSIS — M21612 Bunion of left foot: Secondary | ICD-10-CM | POA: Diagnosis not present

## 2019-11-16 DIAGNOSIS — M2041 Other hammer toe(s) (acquired), right foot: Secondary | ICD-10-CM | POA: Diagnosis not present

## 2019-11-16 DIAGNOSIS — M2042 Other hammer toe(s) (acquired), left foot: Secondary | ICD-10-CM | POA: Diagnosis not present

## 2019-11-16 DIAGNOSIS — L03031 Cellulitis of right toe: Secondary | ICD-10-CM | POA: Diagnosis not present

## 2019-12-04 ENCOUNTER — Ambulatory Visit: Payer: Medicare HMO | Attending: Internal Medicine

## 2019-12-04 DIAGNOSIS — Z23 Encounter for immunization: Secondary | ICD-10-CM

## 2019-12-04 NOTE — Progress Notes (Signed)
   Covid-19 Vaccination Clinic  Name:  Kaitlyn Wright    MRN: [id number] DOB: 01/12/1946  12/04/2019  Kaitlyn Wright was observed post Covid-19 immunization for 15 minutes without incident. She was provided with Vaccine Information Sheet and instruction to access the V-Safe system.   Kaitlyn Wright was instructed to call 911 with any severe reactions post vaccine: Marland Kitchen Difficulty breathing  . Swelling of face and throat  . A fast heartbeat  . A bad rash all over body  . Dizziness and weakness   Immunizations Administered    Name Date Dose VIS Date Route   Moderna COVID-19 Vaccine 12/04/2019 12:27 PM 0.5 mL 08/21/2019 Intramuscular   Manufacturer: Moderna   Lot: [id number]   [id number]

## 2019-12-18 DIAGNOSIS — H04553 Acquired stenosis of bilateral nasolacrimal duct: Secondary | ICD-10-CM | POA: Diagnosis not present

## 2019-12-18 DIAGNOSIS — H04411 Chronic dacryocystitis of right lacrimal passage: Secondary | ICD-10-CM | POA: Diagnosis not present

## 2019-12-18 DIAGNOSIS — H04123 Dry eye syndrome of bilateral lacrimal glands: Secondary | ICD-10-CM | POA: Diagnosis not present

## 2019-12-18 DIAGNOSIS — H04223 Epiphora due to insufficient drainage, bilateral lacrimal glands: Secondary | ICD-10-CM | POA: Diagnosis not present

## 2019-12-18 DIAGNOSIS — H04551 Acquired stenosis of right nasolacrimal duct: Secondary | ICD-10-CM | POA: Diagnosis not present

## 2019-12-18 DIAGNOSIS — H04221 Epiphora due to insufficient drainage, right lacrimal gland: Secondary | ICD-10-CM | POA: Diagnosis not present

## 2019-12-28 DIAGNOSIS — Z01818 Encounter for other preprocedural examination: Secondary | ICD-10-CM | POA: Diagnosis not present

## 2020-01-01 DIAGNOSIS — G8918 Other acute postprocedural pain: Secondary | ICD-10-CM | POA: Diagnosis not present

## 2020-01-01 DIAGNOSIS — H04561 Stenosis of right lacrimal punctum: Secondary | ICD-10-CM | POA: Diagnosis not present

## 2020-01-01 DIAGNOSIS — H04221 Epiphora due to insufficient drainage, right lacrimal gland: Secondary | ICD-10-CM | POA: Diagnosis not present

## 2020-01-01 DIAGNOSIS — H04123 Dry eye syndrome of bilateral lacrimal glands: Secondary | ICD-10-CM | POA: Diagnosis not present

## 2020-01-01 DIAGNOSIS — H04411 Chronic dacryocystitis of right lacrimal passage: Secondary | ICD-10-CM | POA: Diagnosis not present

## 2020-01-01 DIAGNOSIS — H04223 Epiphora due to insufficient drainage, bilateral lacrimal glands: Secondary | ICD-10-CM | POA: Diagnosis not present

## 2020-01-01 DIAGNOSIS — H04551 Acquired stenosis of right nasolacrimal duct: Secondary | ICD-10-CM | POA: Diagnosis not present

## 2020-01-01 DIAGNOSIS — H04553 Acquired stenosis of bilateral nasolacrimal duct: Secondary | ICD-10-CM | POA: Diagnosis not present

## 2020-01-15 DIAGNOSIS — H04221 Epiphora due to insufficient drainage, right lacrimal gland: Secondary | ICD-10-CM | POA: Diagnosis not present

## 2020-01-15 DIAGNOSIS — H04551 Acquired stenosis of right nasolacrimal duct: Secondary | ICD-10-CM | POA: Diagnosis not present

## 2020-03-11 DIAGNOSIS — N183 Chronic kidney disease, stage 3 unspecified: Secondary | ICD-10-CM | POA: Diagnosis not present

## 2020-03-11 DIAGNOSIS — E7800 Pure hypercholesterolemia, unspecified: Secondary | ICD-10-CM | POA: Diagnosis not present

## 2020-03-11 DIAGNOSIS — F419 Anxiety disorder, unspecified: Secondary | ICD-10-CM | POA: Diagnosis not present

## 2020-03-11 DIAGNOSIS — E039 Hypothyroidism, unspecified: Secondary | ICD-10-CM | POA: Diagnosis not present

## 2020-03-11 DIAGNOSIS — R7303 Prediabetes: Secondary | ICD-10-CM | POA: Diagnosis not present

## 2020-04-07 DIAGNOSIS — H04551 Acquired stenosis of right nasolacrimal duct: Secondary | ICD-10-CM | POA: Diagnosis not present

## 2020-04-07 DIAGNOSIS — H04221 Epiphora due to insufficient drainage, right lacrimal gland: Secondary | ICD-10-CM | POA: Diagnosis not present

## 2020-05-06 DIAGNOSIS — H0102B Squamous blepharitis left eye, upper and lower eyelids: Secondary | ICD-10-CM | POA: Diagnosis not present

## 2020-05-06 DIAGNOSIS — H0102A Squamous blepharitis right eye, upper and lower eyelids: Secondary | ICD-10-CM | POA: Diagnosis not present

## 2020-05-06 DIAGNOSIS — H019 Unspecified inflammation of eyelid: Secondary | ICD-10-CM | POA: Diagnosis not present

## 2020-05-06 DIAGNOSIS — H40053 Ocular hypertension, bilateral: Secondary | ICD-10-CM | POA: Diagnosis not present

## 2020-05-06 DIAGNOSIS — H02834 Dermatochalasis of left upper eyelid: Secondary | ICD-10-CM | POA: Diagnosis not present

## 2020-05-06 DIAGNOSIS — H02831 Dermatochalasis of right upper eyelid: Secondary | ICD-10-CM | POA: Diagnosis not present

## 2020-05-06 DIAGNOSIS — H04411 Chronic dacryocystitis of right lacrimal passage: Secondary | ICD-10-CM | POA: Diagnosis not present

## 2020-05-06 DIAGNOSIS — H2513 Age-related nuclear cataract, bilateral: Secondary | ICD-10-CM | POA: Diagnosis not present

## 2020-05-06 DIAGNOSIS — H43813 Vitreous degeneration, bilateral: Secondary | ICD-10-CM | POA: Diagnosis not present

## 2020-05-06 DIAGNOSIS — H04129 Dry eye syndrome of unspecified lacrimal gland: Secondary | ICD-10-CM | POA: Diagnosis not present

## 2020-06-27 DIAGNOSIS — Z23 Encounter for immunization: Secondary | ICD-10-CM | POA: Diagnosis not present

## 2020-09-02 DIAGNOSIS — Z23 Encounter for immunization: Secondary | ICD-10-CM | POA: Diagnosis not present

## 2020-09-02 DIAGNOSIS — M62838 Other muscle spasm: Secondary | ICD-10-CM | POA: Diagnosis not present

## 2020-09-02 DIAGNOSIS — N183 Chronic kidney disease, stage 3 unspecified: Secondary | ICD-10-CM | POA: Diagnosis not present

## 2020-09-02 DIAGNOSIS — E7800 Pure hypercholesterolemia, unspecified: Secondary | ICD-10-CM | POA: Diagnosis not present

## 2020-09-02 DIAGNOSIS — F419 Anxiety disorder, unspecified: Secondary | ICD-10-CM | POA: Diagnosis not present

## 2020-09-02 DIAGNOSIS — R7303 Prediabetes: Secondary | ICD-10-CM | POA: Diagnosis not present

## 2020-09-02 DIAGNOSIS — E039 Hypothyroidism, unspecified: Secondary | ICD-10-CM | POA: Diagnosis not present

## 2020-11-04 DIAGNOSIS — H0102B Squamous blepharitis left eye, upper and lower eyelids: Secondary | ICD-10-CM | POA: Diagnosis not present

## 2020-11-04 DIAGNOSIS — H0102A Squamous blepharitis right eye, upper and lower eyelids: Secondary | ICD-10-CM | POA: Diagnosis not present

## 2020-11-04 DIAGNOSIS — H2513 Age-related nuclear cataract, bilateral: Secondary | ICD-10-CM | POA: Diagnosis not present

## 2020-11-04 DIAGNOSIS — H04411 Chronic dacryocystitis of right lacrimal passage: Secondary | ICD-10-CM | POA: Diagnosis not present

## 2020-11-04 DIAGNOSIS — H40053 Ocular hypertension, bilateral: Secondary | ICD-10-CM | POA: Diagnosis not present

## 2020-11-04 DIAGNOSIS — H43813 Vitreous degeneration, bilateral: Secondary | ICD-10-CM | POA: Diagnosis not present

## 2020-12-26 DIAGNOSIS — H2513 Age-related nuclear cataract, bilateral: Secondary | ICD-10-CM | POA: Diagnosis not present

## 2020-12-26 DIAGNOSIS — H0102A Squamous blepharitis right eye, upper and lower eyelids: Secondary | ICD-10-CM | POA: Diagnosis not present

## 2020-12-26 DIAGNOSIS — H40053 Ocular hypertension, bilateral: Secondary | ICD-10-CM | POA: Diagnosis not present

## 2020-12-26 DIAGNOSIS — H0102B Squamous blepharitis left eye, upper and lower eyelids: Secondary | ICD-10-CM | POA: Diagnosis not present

## 2021-01-09 DIAGNOSIS — R490 Dysphonia: Secondary | ICD-10-CM | POA: Diagnosis not present

## 2021-01-09 DIAGNOSIS — H9313 Tinnitus, bilateral: Secondary | ICD-10-CM | POA: Diagnosis not present

## 2021-03-03 DIAGNOSIS — F419 Anxiety disorder, unspecified: Secondary | ICD-10-CM | POA: Diagnosis not present

## 2021-03-03 DIAGNOSIS — E7800 Pure hypercholesterolemia, unspecified: Secondary | ICD-10-CM | POA: Diagnosis not present

## 2021-03-03 DIAGNOSIS — Z0000 Encounter for general adult medical examination without abnormal findings: Secondary | ICD-10-CM | POA: Diagnosis not present

## 2021-03-03 DIAGNOSIS — N183 Chronic kidney disease, stage 3 unspecified: Secondary | ICD-10-CM | POA: Diagnosis not present

## 2021-03-03 DIAGNOSIS — M545 Low back pain, unspecified: Secondary | ICD-10-CM | POA: Diagnosis not present

## 2021-03-03 DIAGNOSIS — R7309 Other abnormal glucose: Secondary | ICD-10-CM | POA: Diagnosis not present

## 2021-03-03 DIAGNOSIS — E039 Hypothyroidism, unspecified: Secondary | ICD-10-CM | POA: Diagnosis not present

## 2021-03-03 DIAGNOSIS — R490 Dysphonia: Secondary | ICD-10-CM | POA: Diagnosis not present

## 2021-03-20 DIAGNOSIS — Z1211 Encounter for screening for malignant neoplasm of colon: Secondary | ICD-10-CM | POA: Diagnosis not present

## 2021-04-24 DIAGNOSIS — H40053 Ocular hypertension, bilateral: Secondary | ICD-10-CM | POA: Diagnosis not present

## 2021-04-24 DIAGNOSIS — H0102B Squamous blepharitis left eye, upper and lower eyelids: Secondary | ICD-10-CM | POA: Diagnosis not present

## 2021-04-24 DIAGNOSIS — H0102A Squamous blepharitis right eye, upper and lower eyelids: Secondary | ICD-10-CM | POA: Diagnosis not present

## 2021-04-24 DIAGNOSIS — H2513 Age-related nuclear cataract, bilateral: Secondary | ICD-10-CM | POA: Diagnosis not present

## 2021-07-03 DIAGNOSIS — Z23 Encounter for immunization: Secondary | ICD-10-CM | POA: Diagnosis not present

## 2021-07-14 DIAGNOSIS — J40 Bronchitis, not specified as acute or chronic: Secondary | ICD-10-CM | POA: Diagnosis not present

## 2021-08-28 DIAGNOSIS — H2513 Age-related nuclear cataract, bilateral: Secondary | ICD-10-CM | POA: Diagnosis not present

## 2021-08-28 DIAGNOSIS — H0102B Squamous blepharitis left eye, upper and lower eyelids: Secondary | ICD-10-CM | POA: Diagnosis not present

## 2021-08-28 DIAGNOSIS — H43813 Vitreous degeneration, bilateral: Secondary | ICD-10-CM | POA: Diagnosis not present

## 2021-08-28 DIAGNOSIS — H04411 Chronic dacryocystitis of right lacrimal passage: Secondary | ICD-10-CM | POA: Diagnosis not present

## 2021-08-28 DIAGNOSIS — H40053 Ocular hypertension, bilateral: Secondary | ICD-10-CM | POA: Diagnosis not present

## 2021-08-28 DIAGNOSIS — H0102A Squamous blepharitis right eye, upper and lower eyelids: Secondary | ICD-10-CM | POA: Diagnosis not present

## 2021-09-04 DIAGNOSIS — Z23 Encounter for immunization: Secondary | ICD-10-CM | POA: Diagnosis not present

## 2021-09-04 DIAGNOSIS — M545 Low back pain, unspecified: Secondary | ICD-10-CM | POA: Diagnosis not present

## 2021-09-04 DIAGNOSIS — L84 Corns and callosities: Secondary | ICD-10-CM | POA: Diagnosis not present

## 2021-09-04 DIAGNOSIS — E039 Hypothyroidism, unspecified: Secondary | ICD-10-CM | POA: Diagnosis not present

## 2021-09-04 DIAGNOSIS — F419 Anxiety disorder, unspecified: Secondary | ICD-10-CM | POA: Diagnosis not present

## 2021-09-04 DIAGNOSIS — E7800 Pure hypercholesterolemia, unspecified: Secondary | ICD-10-CM | POA: Diagnosis not present

## 2021-09-04 DIAGNOSIS — R7303 Prediabetes: Secondary | ICD-10-CM | POA: Diagnosis not present

## 2021-09-25 ENCOUNTER — Ambulatory Visit: Payer: Medicare HMO | Admitting: Podiatry

## 2021-09-25 ENCOUNTER — Other Ambulatory Visit: Payer: Self-pay

## 2021-10-09 DIAGNOSIS — M2041 Other hammer toe(s) (acquired), right foot: Secondary | ICD-10-CM | POA: Diagnosis not present

## 2021-10-09 DIAGNOSIS — Q828 Other specified congenital malformations of skin: Secondary | ICD-10-CM | POA: Diagnosis not present

## 2021-10-09 DIAGNOSIS — M792 Neuralgia and neuritis, unspecified: Secondary | ICD-10-CM | POA: Diagnosis not present

## 2021-11-15 DIAGNOSIS — R112 Nausea with vomiting, unspecified: Secondary | ICD-10-CM | POA: Diagnosis not present

## 2021-11-15 DIAGNOSIS — Z03818 Encounter for observation for suspected exposure to other biological agents ruled out: Secondary | ICD-10-CM | POA: Diagnosis not present

## 2022-01-05 DIAGNOSIS — H04411 Chronic dacryocystitis of right lacrimal passage: Secondary | ICD-10-CM | POA: Diagnosis not present

## 2022-01-05 DIAGNOSIS — H2513 Age-related nuclear cataract, bilateral: Secondary | ICD-10-CM | POA: Diagnosis not present

## 2022-01-05 DIAGNOSIS — H0102B Squamous blepharitis left eye, upper and lower eyelids: Secondary | ICD-10-CM | POA: Diagnosis not present

## 2022-01-05 DIAGNOSIS — H0102A Squamous blepharitis right eye, upper and lower eyelids: Secondary | ICD-10-CM | POA: Diagnosis not present

## 2022-01-05 DIAGNOSIS — H40053 Ocular hypertension, bilateral: Secondary | ICD-10-CM | POA: Diagnosis not present

## 2022-01-05 DIAGNOSIS — H43813 Vitreous degeneration, bilateral: Secondary | ICD-10-CM | POA: Diagnosis not present

## 2022-01-26 DIAGNOSIS — R059 Cough, unspecified: Secondary | ICD-10-CM | POA: Diagnosis not present

## 2022-01-26 DIAGNOSIS — Z03818 Encounter for observation for suspected exposure to other biological agents ruled out: Secondary | ICD-10-CM | POA: Diagnosis not present

## 2022-01-26 DIAGNOSIS — J040 Acute laryngitis: Secondary | ICD-10-CM | POA: Diagnosis not present

## 2022-01-26 DIAGNOSIS — R070 Pain in throat: Secondary | ICD-10-CM | POA: Diagnosis not present

## 2022-01-26 DIAGNOSIS — R208 Other disturbances of skin sensation: Secondary | ICD-10-CM | POA: Diagnosis not present

## 2022-03-05 DIAGNOSIS — E7800 Pure hypercholesterolemia, unspecified: Secondary | ICD-10-CM | POA: Diagnosis not present

## 2022-03-05 DIAGNOSIS — R7303 Prediabetes: Secondary | ICD-10-CM | POA: Diagnosis not present

## 2022-03-05 DIAGNOSIS — E039 Hypothyroidism, unspecified: Secondary | ICD-10-CM | POA: Diagnosis not present

## 2022-03-05 DIAGNOSIS — N183 Chronic kidney disease, stage 3 unspecified: Secondary | ICD-10-CM | POA: Diagnosis not present

## 2022-03-05 DIAGNOSIS — F419 Anxiety disorder, unspecified: Secondary | ICD-10-CM | POA: Diagnosis not present

## 2022-03-05 DIAGNOSIS — Z0000 Encounter for general adult medical examination without abnormal findings: Secondary | ICD-10-CM | POA: Diagnosis not present

## 2022-05-11 DIAGNOSIS — H40053 Ocular hypertension, bilateral: Secondary | ICD-10-CM | POA: Diagnosis not present

## 2022-05-11 DIAGNOSIS — H0102B Squamous blepharitis left eye, upper and lower eyelids: Secondary | ICD-10-CM | POA: Diagnosis not present

## 2022-05-11 DIAGNOSIS — H2513 Age-related nuclear cataract, bilateral: Secondary | ICD-10-CM | POA: Diagnosis not present

## 2022-05-11 DIAGNOSIS — H0102A Squamous blepharitis right eye, upper and lower eyelids: Secondary | ICD-10-CM | POA: Diagnosis not present

## 2022-09-01 DIAGNOSIS — L821 Other seborrheic keratosis: Secondary | ICD-10-CM | POA: Diagnosis not present

## 2022-09-01 DIAGNOSIS — E7800 Pure hypercholesterolemia, unspecified: Secondary | ICD-10-CM | POA: Diagnosis not present

## 2022-09-01 DIAGNOSIS — J309 Allergic rhinitis, unspecified: Secondary | ICD-10-CM | POA: Diagnosis not present

## 2022-09-01 DIAGNOSIS — E039 Hypothyroidism, unspecified: Secondary | ICD-10-CM | POA: Diagnosis not present

## 2022-09-01 DIAGNOSIS — F419 Anxiety disorder, unspecified: Secondary | ICD-10-CM | POA: Diagnosis not present

## 2022-09-01 DIAGNOSIS — B356 Tinea cruris: Secondary | ICD-10-CM | POA: Diagnosis not present

## 2022-09-01 DIAGNOSIS — R7303 Prediabetes: Secondary | ICD-10-CM | POA: Diagnosis not present

## 2022-09-01 DIAGNOSIS — R7309 Other abnormal glucose: Secondary | ICD-10-CM | POA: Diagnosis not present

## 2022-10-12 DIAGNOSIS — H43813 Vitreous degeneration, bilateral: Secondary | ICD-10-CM | POA: Diagnosis not present

## 2022-10-12 DIAGNOSIS — H0102A Squamous blepharitis right eye, upper and lower eyelids: Secondary | ICD-10-CM | POA: Diagnosis not present

## 2022-10-12 DIAGNOSIS — H04411 Chronic dacryocystitis of right lacrimal passage: Secondary | ICD-10-CM | POA: Diagnosis not present

## 2022-10-12 DIAGNOSIS — H2513 Age-related nuclear cataract, bilateral: Secondary | ICD-10-CM | POA: Diagnosis not present

## 2022-10-12 DIAGNOSIS — H0102B Squamous blepharitis left eye, upper and lower eyelids: Secondary | ICD-10-CM | POA: Diagnosis not present

## 2022-10-12 DIAGNOSIS — H40053 Ocular hypertension, bilateral: Secondary | ICD-10-CM | POA: Diagnosis not present

## 2022-12-03 DIAGNOSIS — S80812A Abrasion, left lower leg, initial encounter: Secondary | ICD-10-CM | POA: Diagnosis not present

## 2022-12-03 DIAGNOSIS — M542 Cervicalgia: Secondary | ICD-10-CM | POA: Diagnosis not present

## 2022-12-03 DIAGNOSIS — W19XXXA Unspecified fall, initial encounter: Secondary | ICD-10-CM | POA: Diagnosis not present

## 2022-12-09 DIAGNOSIS — L089 Local infection of the skin and subcutaneous tissue, unspecified: Secondary | ICD-10-CM | POA: Diagnosis not present

## 2022-12-09 DIAGNOSIS — R600 Localized edema: Secondary | ICD-10-CM | POA: Diagnosis not present

## 2022-12-09 DIAGNOSIS — S80812A Abrasion, left lower leg, initial encounter: Secondary | ICD-10-CM | POA: Diagnosis not present

## 2022-12-10 ENCOUNTER — Other Ambulatory Visit: Payer: Self-pay

## 2022-12-10 ENCOUNTER — Emergency Department (HOSPITAL_BASED_OUTPATIENT_CLINIC_OR_DEPARTMENT_OTHER): Payer: Medicare HMO | Admitting: Radiology

## 2022-12-10 ENCOUNTER — Emergency Department (HOSPITAL_BASED_OUTPATIENT_CLINIC_OR_DEPARTMENT_OTHER): Payer: Medicare HMO

## 2022-12-10 ENCOUNTER — Encounter (HOSPITAL_BASED_OUTPATIENT_CLINIC_OR_DEPARTMENT_OTHER): Payer: Self-pay

## 2022-12-10 ENCOUNTER — Emergency Department (HOSPITAL_BASED_OUTPATIENT_CLINIC_OR_DEPARTMENT_OTHER)
Admission: EM | Admit: 2022-12-10 | Discharge: 2022-12-10 | Disposition: A | Discharge status: Home / Self Care | Payer: Medicare HMO | Attending: Emergency Medicine | Admitting: Emergency Medicine

## 2022-12-10 DIAGNOSIS — M79605 Pain in left leg: Secondary | ICD-10-CM | POA: Diagnosis not present

## 2022-12-10 DIAGNOSIS — Z23 Encounter for immunization: Secondary | ICD-10-CM | POA: Insufficient documentation

## 2022-12-10 DIAGNOSIS — S8012XA Contusion of left lower leg, initial encounter: Secondary | ICD-10-CM | POA: Diagnosis not present

## 2022-12-10 DIAGNOSIS — L03116 Cellulitis of left lower limb: Secondary | ICD-10-CM

## 2022-12-10 DIAGNOSIS — W1830XA Fall on same level, unspecified, initial encounter: Secondary | ICD-10-CM | POA: Insufficient documentation

## 2022-12-10 DIAGNOSIS — M7989 Other specified soft tissue disorders: Secondary | ICD-10-CM | POA: Diagnosis not present

## 2022-12-10 DIAGNOSIS — M25572 Pain in left ankle and joints of left foot: Secondary | ICD-10-CM | POA: Diagnosis not present

## 2022-12-10 DIAGNOSIS — Z7982 Long term (current) use of aspirin: Secondary | ICD-10-CM | POA: Diagnosis not present

## 2022-12-10 MED ORDER — TETANUS-DIPHTH-ACELL PERTUSSIS 5-2.5-18.5 LF-MCG/0.5 IM SUSY
0.5000 mL | PREFILLED_SYRINGE | Freq: Once | INTRAMUSCULAR | Status: AC
Start: 2022-12-10 — End: 2022-12-10
  Administered 2022-12-10: 0.5 mL via INTRAMUSCULAR
  Filled 2022-12-10: qty 0.5

## 2022-12-10 MED ORDER — CEPHALEXIN 500 MG PO CAPS: 500.0000 mg | ORAL_CAPSULE | Freq: Four times a day (QID) | ORAL | 0 refills | Status: DC

## 2022-12-10 NOTE — ED Provider Notes (Signed)
Patient was given at signout from [employer], PA-C.  Please review her note for patient's history and workup.  At this time waiting for DVT study and if negative patient will be discharged.  Crushing chest pain troponin patient's DVT 28 was reordered as the bolus while to the [employer].  I evaluated the patient and patient had sprains in her but likely having underlying ecchymosis.  Patient verbalizes any multiple times that she wants to leave use and I spoke with her about the ultrasound certainly for possible blood clot.  At this time patient is in agreement for the ultrasound.  Patient stable at this time.  Patient's ultrasound was negative for DVT.  Patient will be discharged on Keflex for possible cellulitis and given return precautions.  I spoke with the patient about following up with her primary care provider next 2 days to be reevaluated things may change.  Patient verbalized understanding of this plan.  Patient stable for outpatient therapy at this time.   Elvina Sidle 12/10/22 2152    Tegeler, Gwenyth Allegra, MD 12/10/22 [phone]

## 2022-12-10 NOTE — ED Provider Notes (Signed)
[facility] Provider Note   CSN: [id number] Arrival date & time: 12/10/22  1721     History  Chief Complaint  Patient presents with   Ankle Pain    Kaitlyn Wright is a 77 y.o. female, no pertinent past medical history, who presents to the ED secondary to a fall that occurred about a week ago, when she was carrying her states she had 2 stairs, that she has to jump out of her arm, and then dragged her across the floor.  She states that she fell on the left side of her face and has had some left facial tenderness since then, and developed wounds on her bilateral knees.  She states that the 1 thing that is really bothering her the most however is her left ankle.  She states it is swollen, and tender to the touch and has difficulty weightbearing.  She notes that she is not on any blood thinners just on daily baby aspirin.  Has not had any mental status changes, no nausea or vomiting.  Unknown last tetanus.  No loss of consciousness with the event.    Home Medications Prior to Admission medications   Medication Sig Start Date End Date Taking? Authorizing Provider  cephALEXin (KEFLEX) 500 MG capsule Take 1 capsule (500 mg total) by mouth 4 (four) times daily. 12/10/22  Yes Borna Wessinger L, PA  acetaminophen (TYLENOL) 500 MG tablet Take 500 mg by mouth every 6 (six) hours as needed.    [provider]  ALPRAZolam Duanne Moron) 1 MG tablet Take 1 mg by mouth 3 (three) times daily as needed for anxiety or sleep.    [provider]  aspirin EC 81 MG tablet Take 81 mg by mouth every morning.    [provider]  B Complex-C (B-COMPLEX WITH VITAMIN C) tablet Take 1 tablet by mouth daily.    [provider]  cholecalciferol (VITAMIN D) 1000 UNITS tablet Take 1,000 Units by mouth daily.    [provider]  Cinnamon 500 MG capsule Take 500 mg by mouth daily.    [provider]  docusate sodium (COLACE) 100 MG capsule  Take 1 capsule (100 mg total) by mouth 2 (two) times daily as needed for mild constipation. 10/20/13   Tanna Furry, MD  Garlic [id number] MG CAPS Take by mouth.    [provider]  Javier Docker Oil 1000 MG CAPS Take by mouth.    [provider]  levothyroxine (SYNTHROID, LEVOTHROID) 100 MCG tablet Take 100 mcg by mouth daily before breakfast.    [provider]  Omega-3 Fatty Acids (FISH OIL) 1000 MG CPDR Take by mouth.    [provider]  Red Yeast Rice 600 MG CAPS Take by mouth.    [provider]  vitamin C (ASCORBIC ACID) 500 MG tablet Take 500 mg by mouth daily.    [provider]      Allergies    Naprosyn [naproxen]    Review of Systems   Review of Systems  Musculoskeletal:  Negative for neck pain.  Skin:  Positive for wound.    Physical Exam Updated Vital Signs BP (!) 180/71 (BP Location: Right Arm)   Pulse 96   Temp 98.5 F (36.9 C)   Resp 18   Ht 5' 3.5" (1.613 m)   Wt 61.7 kg   SpO2 96%   BMI 23.71 kg/m  Physical Exam Vitals and nursing note reviewed.  Constitutional:  General: She is not in acute distress.    Appearance: She is well-developed.  HENT:     Head: Normocephalic and atraumatic.     Right Ear: Tympanic membrane normal.     Left Ear: Tympanic membrane normal.     Nose:     Comments: No septal hematoma to bilateral naris    Mouth/Throat:     Mouth: Mucous membranes are moist.     Comments: No blood in oropharynx. Eyes:     Conjunctiva/sclera: Conjunctivae normal.  Cardiovascular:     Rate and Rhythm: Normal rate and regular rhythm.     Heart sounds: No murmur heard. Pulmonary:     Effort: Pulmonary effort is normal. No respiratory distress.     Breath sounds: Normal breath sounds.  Abdominal:     Palpations: Abdomen is soft.     Tenderness: There is no abdominal tenderness.  Musculoskeletal:        General: No swelling.     Cervical back: Neck supple.     Comments: Tenderness to palpation of  left maxilla. No stepoffs.  Left/Right ankle: TTP of medial malleolus. Edema noted to ankle. Able to bear weight. Able to plantar flex and dorsiflex ankle. Inversion/eversion intact. Negative Thompson test. No midfoot tor base of 5th metatarsal tenderness to palpation. Capillary refill <2sec. Dorsalis pedis pulse present. No foot drop noted. Sensation intact. Warm to touch.  TTP of proximal metatarsals.   Skin:    General: Skin is warm and dry.     Capillary Refill: Capillary refill takes less than 2 seconds.     Comments: Ecchymoses to L ankle. Healing wounds to bilateral lower legs.  Hematoma to the inferior aspect of the knee.  Mild erythema around it.  Neurological:     Mental Status: She is alert.  Psychiatric:        Mood and Affect: Mood normal.     ED Results / Procedures / Treatments   Labs (all labs ordered are listed, but only abnormal results are displayed) Labs Reviewed - No data to display  EKG None  Radiology DG Tibia/Fibula Left  Result Date: 12/10/2022 CLINICAL DATA:  Fall, infrapatellar swelling EXAM: LEFT TIBIA AND FIBULA - 2 VIEW COMPARISON:  None Available. FINDINGS: Mild medial compartmental articular space narrowing and spurring. Soft tissue swelling overlying the tibial tubercle without underlying bony abnormality. Mild subcutaneous edema in the calf. No fracture or acute bony findings. IMPRESSION: 1. Soft tissue swelling overlying the tibial tubercle without underlying bony abnormality. 2. Mild subcutaneous edema in the calf. 3. Mild osteoarthritis in the medial compartment. Electronically Signed   By: Van Clines M.D.   On: 12/10/2022 18:52   DG Foot Complete Left  Result Date: 12/10/2022 CLINICAL DATA:  Infrapatellar swelling.  Fall. EXAM: LEFT FOOT - COMPLETE 3+ VIEW COMPARISON:  None Available. FINDINGS: Type 2 accessory navicular. Soft tissue swelling dorsal to the proximal metatarsals, without underlying bony abnormality observed. No fracture or  malalignment. IMPRESSION: 1. Soft tissue swelling dorsal to the proximal metatarsals, without underlying bony abnormality observed. 2. Type 2 accessory navicular. Electronically Signed   By: Van Clines M.D.   On: 12/10/2022 18:51   DG Ankle Complete Left  Result Date: 12/10/2022 CLINICAL DATA:  Fall, trauma.  Ankle pain. EXAM: LEFT ANKLE COMPLETE - 3+ VIEW COMPARISON:  None Available. FINDINGS: Anastasios Melander plantar and Achilles calcaneal spurs. No fracture or acute bony findings. Mild subcutaneous edema in the distal calf and along the ankle. IMPRESSION: 1. No fracture  or acute bony findings. 2. Fayelynn Distel plantar and Achilles calcaneal spurs. 3. Subcutaneous edema in the distal calf and ankle. Electronically Signed   By: Van Clines M.D.   On: 12/10/2022 18:49    Procedures Procedures    Medications Ordered in ED Medications  Tdap (BOOSTRIX) injection 0.5 mL (0.5 mLs Intramuscular Given 12/10/22 1841)    ED Course/ Medical Decision Making/ A&P                             Medical Decision Making Patient is a 77 year old female, who was dragged by her dog, after getting up the stairs, fell on the left side of her face, and was drugged.  With complaint of left facial pain, and no headache or nausea, vomiting or confusion.  It has been a week.  I recommend a CT head, maxillofacial given the pain, and head trauma, she declined, she is aware of missing a subdural, fracture.  She voiced understanding of risk associated with this.  Additionally we will obtain x-rays of her legs, to evaluate.  I am a little bit suspicious for DVT as she had ecchymosis swelling of her leg, several days after the event.  The swelling just began around 2 to 3 days ago.  Concern for possible traumatic DVT we will under order DVT ultrasound.  Amount and/or Complexity of Data Reviewed Radiology: ordered.    Details: Soft tissue swelling, per x-ray, no fractures visualized. Discussion of management or test interpretation  with external provider(s): Discussed with Jeneen Rinks PA, he voiced understanding of dispo.  Sent Keflex for possible brewing cellulitis for the wounds.  Additionally will x-rays unremarkable.  We will obtain a DVT ultrasound for further evaluation.  He will disposition patient appropriately.  She declined a CT face, CT head, she is aware of the risk associated with this.  Tetanus updated given the wounds.  Risk Prescription drug management.    Final Clinical Impression(s) / ED Diagnoses Final diagnoses:  None    Rx / DC Orders ED Discharge Orders          Ordered    cephALEXin (KEFLEX) 500 MG capsule  4 times daily        12/10/22 1907              Osvaldo Shipper, [place] 12/10/22 1909    Tegeler, Gwenyth Allegra, MD 12/10/22 [phone]

## 2022-12-10 NOTE — Discharge Instructions (Signed)
Please pick up the antibiotic has been prescribed.  Please make an appoint with your primary care provider to be reevaluated the next 2 days regarding recent symptoms and ER visit.  If symptoms worsen please return to ER.

## 2022-12-10 NOTE — ED Notes (Signed)
Patient verbalizes understanding of discharge instructions. Opportunity for questioning and answers were provided. Armband removed by staff, pt discharged from ED. Ambulated out to lobby  

## 2022-12-10 NOTE — ED Triage Notes (Signed)
Pt to er, pt states that her dog drug her down, states that she was going up some steps and her dog tripped her up.  Pt c/o pain to L face and leg pain.  States that she fell last week, but is here because she is having some swelling in her L foot and pain in her R ankle.

## 2023-01-03 DIAGNOSIS — M9903 Segmental and somatic dysfunction of lumbar region: Secondary | ICD-10-CM | POA: Diagnosis not present

## 2023-01-03 DIAGNOSIS — S335XXA Sprain of ligaments of lumbar spine, initial encounter: Secondary | ICD-10-CM | POA: Diagnosis not present

## 2023-01-10 DIAGNOSIS — S335XXA Sprain of ligaments of lumbar spine, initial encounter: Secondary | ICD-10-CM | POA: Diagnosis not present

## 2023-01-10 DIAGNOSIS — M9903 Segmental and somatic dysfunction of lumbar region: Secondary | ICD-10-CM | POA: Diagnosis not present

## 2023-01-17 DIAGNOSIS — M9903 Segmental and somatic dysfunction of lumbar region: Secondary | ICD-10-CM | POA: Diagnosis not present

## 2023-01-17 DIAGNOSIS — S335XXA Sprain of ligaments of lumbar spine, initial encounter: Secondary | ICD-10-CM | POA: Diagnosis not present

## 2023-01-31 DIAGNOSIS — S335XXA Sprain of ligaments of lumbar spine, initial encounter: Secondary | ICD-10-CM | POA: Diagnosis not present

## 2023-01-31 DIAGNOSIS — M9903 Segmental and somatic dysfunction of lumbar region: Secondary | ICD-10-CM | POA: Diagnosis not present

## 2023-02-01 DIAGNOSIS — R059 Cough, unspecified: Secondary | ICD-10-CM | POA: Diagnosis not present

## 2023-02-01 DIAGNOSIS — S81812A Laceration without foreign body, left lower leg, initial encounter: Secondary | ICD-10-CM | POA: Diagnosis not present

## 2023-02-01 DIAGNOSIS — I83812 Varicose veins of left lower extremities with pain: Secondary | ICD-10-CM | POA: Diagnosis not present

## 2023-02-01 DIAGNOSIS — R600 Localized edema: Secondary | ICD-10-CM | POA: Diagnosis not present

## 2023-02-08 DIAGNOSIS — H2513 Age-related nuclear cataract, bilateral: Secondary | ICD-10-CM | POA: Diagnosis not present

## 2023-02-08 DIAGNOSIS — H43813 Vitreous degeneration, bilateral: Secondary | ICD-10-CM | POA: Diagnosis not present

## 2023-02-08 DIAGNOSIS — H0102B Squamous blepharitis left eye, upper and lower eyelids: Secondary | ICD-10-CM | POA: Diagnosis not present

## 2023-02-08 DIAGNOSIS — H0102A Squamous blepharitis right eye, upper and lower eyelids: Secondary | ICD-10-CM | POA: Diagnosis not present

## 2023-02-08 DIAGNOSIS — H40053 Ocular hypertension, bilateral: Secondary | ICD-10-CM | POA: Diagnosis not present

## 2023-02-08 DIAGNOSIS — H04411 Chronic dacryocystitis of right lacrimal passage: Secondary | ICD-10-CM | POA: Diagnosis not present

## 2023-02-28 DIAGNOSIS — M9903 Segmental and somatic dysfunction of lumbar region: Secondary | ICD-10-CM | POA: Diagnosis not present

## 2023-02-28 DIAGNOSIS — S335XXA Sprain of ligaments of lumbar spine, initial encounter: Secondary | ICD-10-CM | POA: Diagnosis not present

## 2023-03-04 ENCOUNTER — Other Ambulatory Visit: Payer: Self-pay | Admitting: Family Medicine

## 2023-03-04 ENCOUNTER — Ambulatory Visit
Admission: RE | Admit: 2023-03-04 | Discharge: 2023-03-04 | Disposition: A | Discharge status: Home / Self Care | Payer: Medicare HMO | Source: Ambulatory Visit | Attending: Family Medicine | Admitting: Family Medicine

## 2023-03-04 DIAGNOSIS — M79605 Pain in left leg: Secondary | ICD-10-CM

## 2023-03-04 DIAGNOSIS — F5101 Primary insomnia: Secondary | ICD-10-CM | POA: Diagnosis not present

## 2023-03-04 DIAGNOSIS — Z0389 Encounter for observation for other suspected diseases and conditions ruled out: Secondary | ICD-10-CM | POA: Diagnosis not present

## 2023-03-07 ENCOUNTER — Other Ambulatory Visit: Payer: Self-pay | Admitting: Family Medicine

## 2023-03-07 DIAGNOSIS — M9903 Segmental and somatic dysfunction of lumbar region: Secondary | ICD-10-CM | POA: Diagnosis not present

## 2023-03-07 DIAGNOSIS — S335XXA Sprain of ligaments of lumbar spine, initial encounter: Secondary | ICD-10-CM | POA: Diagnosis not present

## 2023-03-07 DIAGNOSIS — M79605 Pain in left leg: Secondary | ICD-10-CM

## 2023-03-14 DIAGNOSIS — S335XXA Sprain of ligaments of lumbar spine, initial encounter: Secondary | ICD-10-CM | POA: Diagnosis not present

## 2023-03-14 DIAGNOSIS — M9903 Segmental and somatic dysfunction of lumbar region: Secondary | ICD-10-CM | POA: Diagnosis not present

## 2023-03-28 DIAGNOSIS — M9903 Segmental and somatic dysfunction of lumbar region: Secondary | ICD-10-CM | POA: Diagnosis not present

## 2023-03-28 DIAGNOSIS — S335XXA Sprain of ligaments of lumbar spine, initial encounter: Secondary | ICD-10-CM | POA: Diagnosis not present

## 2023-04-04 DIAGNOSIS — S335XXA Sprain of ligaments of lumbar spine, initial encounter: Secondary | ICD-10-CM | POA: Diagnosis not present

## 2023-04-04 DIAGNOSIS — M9903 Segmental and somatic dysfunction of lumbar region: Secondary | ICD-10-CM | POA: Diagnosis not present

## 2023-04-08 DIAGNOSIS — R7303 Prediabetes: Secondary | ICD-10-CM | POA: Diagnosis not present

## 2023-04-08 DIAGNOSIS — E039 Hypothyroidism, unspecified: Secondary | ICD-10-CM | POA: Diagnosis not present

## 2023-04-08 DIAGNOSIS — F419 Anxiety disorder, unspecified: Secondary | ICD-10-CM | POA: Diagnosis not present

## 2023-04-08 DIAGNOSIS — R490 Dysphonia: Secondary | ICD-10-CM | POA: Diagnosis not present

## 2023-04-08 DIAGNOSIS — F5101 Primary insomnia: Secondary | ICD-10-CM | POA: Diagnosis not present

## 2023-04-08 DIAGNOSIS — E7800 Pure hypercholesterolemia, unspecified: Secondary | ICD-10-CM | POA: Diagnosis not present

## 2023-04-08 DIAGNOSIS — Z0000 Encounter for general adult medical examination without abnormal findings: Secondary | ICD-10-CM | POA: Diagnosis not present

## 2023-04-08 DIAGNOSIS — J309 Allergic rhinitis, unspecified: Secondary | ICD-10-CM | POA: Diagnosis not present

## 2023-04-08 DIAGNOSIS — Z9181 History of falling: Secondary | ICD-10-CM | POA: Diagnosis not present

## 2023-04-18 DIAGNOSIS — S335XXA Sprain of ligaments of lumbar spine, initial encounter: Secondary | ICD-10-CM | POA: Diagnosis not present

## 2023-04-18 DIAGNOSIS — M9903 Segmental and somatic dysfunction of lumbar region: Secondary | ICD-10-CM | POA: Diagnosis not present

## 2023-05-09 DIAGNOSIS — M9903 Segmental and somatic dysfunction of lumbar region: Secondary | ICD-10-CM | POA: Diagnosis not present

## 2023-05-09 DIAGNOSIS — S335XXA Sprain of ligaments of lumbar spine, initial encounter: Secondary | ICD-10-CM | POA: Diagnosis not present

## 2023-05-18 DIAGNOSIS — S335XXA Sprain of ligaments of lumbar spine, initial encounter: Secondary | ICD-10-CM | POA: Diagnosis not present

## 2023-05-18 DIAGNOSIS — M9903 Segmental and somatic dysfunction of lumbar region: Secondary | ICD-10-CM | POA: Diagnosis not present

## 2023-06-20 DIAGNOSIS — S335XXA Sprain of ligaments of lumbar spine, initial encounter: Secondary | ICD-10-CM | POA: Diagnosis not present

## 2023-06-20 DIAGNOSIS — M9903 Segmental and somatic dysfunction of lumbar region: Secondary | ICD-10-CM | POA: Diagnosis not present

## 2023-06-21 DIAGNOSIS — H40053 Ocular hypertension, bilateral: Secondary | ICD-10-CM | POA: Diagnosis not present

## 2023-06-21 DIAGNOSIS — H43813 Vitreous degeneration, bilateral: Secondary | ICD-10-CM | POA: Diagnosis not present

## 2023-06-21 DIAGNOSIS — H0102A Squamous blepharitis right eye, upper and lower eyelids: Secondary | ICD-10-CM | POA: Diagnosis not present

## 2023-06-21 DIAGNOSIS — H2513 Age-related nuclear cataract, bilateral: Secondary | ICD-10-CM | POA: Diagnosis not present

## 2023-06-21 DIAGNOSIS — H04411 Chronic dacryocystitis of right lacrimal passage: Secondary | ICD-10-CM | POA: Diagnosis not present

## 2023-06-21 DIAGNOSIS — H0102B Squamous blepharitis left eye, upper and lower eyelids: Secondary | ICD-10-CM | POA: Diagnosis not present

## 2023-07-11 DIAGNOSIS — M9903 Segmental and somatic dysfunction of lumbar region: Secondary | ICD-10-CM | POA: Diagnosis not present

## 2023-07-11 DIAGNOSIS — S335XXA Sprain of ligaments of lumbar spine, initial encounter: Secondary | ICD-10-CM | POA: Diagnosis not present

## 2023-07-25 DIAGNOSIS — S335XXA Sprain of ligaments of lumbar spine, initial encounter: Secondary | ICD-10-CM | POA: Diagnosis not present

## 2023-07-25 DIAGNOSIS — M9903 Segmental and somatic dysfunction of lumbar region: Secondary | ICD-10-CM | POA: Diagnosis not present

## 2023-08-08 DIAGNOSIS — M9903 Segmental and somatic dysfunction of lumbar region: Secondary | ICD-10-CM | POA: Diagnosis not present

## 2023-08-08 DIAGNOSIS — S335XXA Sprain of ligaments of lumbar spine, initial encounter: Secondary | ICD-10-CM | POA: Diagnosis not present

## 2023-08-30 DIAGNOSIS — J309 Allergic rhinitis, unspecified: Secondary | ICD-10-CM | POA: Diagnosis not present

## 2023-08-30 DIAGNOSIS — E039 Hypothyroidism, unspecified: Secondary | ICD-10-CM | POA: Diagnosis not present

## 2023-08-30 DIAGNOSIS — F419 Anxiety disorder, unspecified: Secondary | ICD-10-CM | POA: Diagnosis not present

## 2023-08-30 DIAGNOSIS — E7800 Pure hypercholesterolemia, unspecified: Secondary | ICD-10-CM | POA: Diagnosis not present

## 2023-08-30 DIAGNOSIS — F5101 Primary insomnia: Secondary | ICD-10-CM | POA: Diagnosis not present

## 2023-08-30 DIAGNOSIS — R490 Dysphonia: Secondary | ICD-10-CM | POA: Diagnosis not present

## 2023-08-30 DIAGNOSIS — R252 Cramp and spasm: Secondary | ICD-10-CM | POA: Diagnosis not present

## 2023-08-30 DIAGNOSIS — R7303 Prediabetes: Secondary | ICD-10-CM | POA: Diagnosis not present

## 2023-12-27 DIAGNOSIS — R300 Dysuria: Secondary | ICD-10-CM | POA: Diagnosis not present

## 2024-01-23 ENCOUNTER — Inpatient Hospital Stay (HOSPITAL_COMMUNITY)
Admission: EM | Admit: 2024-01-23 | Discharge: 2024-02-15 | DRG: 233 | Disposition: A | Discharge status: Skilled Nursing Facility | Attending: Thoracic Surgery (Cardiothoracic Vascular Surgery) | Admitting: Thoracic Surgery (Cardiothoracic Vascular Surgery)

## 2024-01-23 ENCOUNTER — Emergency Department (HOSPITAL_COMMUNITY)

## 2024-01-23 ENCOUNTER — Encounter (HOSPITAL_COMMUNITY): Payer: Self-pay | Admitting: Student

## 2024-01-23 DIAGNOSIS — Q250 Patent ductus arteriosus: Secondary | ICD-10-CM

## 2024-01-23 DIAGNOSIS — I110 Hypertensive heart disease with heart failure: Secondary | ICD-10-CM | POA: Diagnosis present

## 2024-01-23 DIAGNOSIS — Z7902 Long term (current) use of antithrombotics/antiplatelets: Secondary | ICD-10-CM

## 2024-01-23 DIAGNOSIS — R41841 Cognitive communication deficit: Secondary | ICD-10-CM | POA: Diagnosis not present

## 2024-01-23 DIAGNOSIS — Z452 Encounter for adjustment and management of vascular access device: Secondary | ICD-10-CM | POA: Diagnosis not present

## 2024-01-23 DIAGNOSIS — Z7401 Bed confinement status: Secondary | ICD-10-CM | POA: Diagnosis not present

## 2024-01-23 DIAGNOSIS — D62 Acute posthemorrhagic anemia: Secondary | ICD-10-CM | POA: Diagnosis present

## 2024-01-23 DIAGNOSIS — S2241XA Multiple fractures of ribs, right side, initial encounter for closed fracture: Secondary | ICD-10-CM | POA: Diagnosis not present

## 2024-01-23 DIAGNOSIS — M6281 Muscle weakness (generalized): Secondary | ICD-10-CM | POA: Diagnosis not present

## 2024-01-23 DIAGNOSIS — J9811 Atelectasis: Secondary | ICD-10-CM | POA: Diagnosis present

## 2024-01-23 DIAGNOSIS — Z951 Presence of aortocoronary bypass graft: Secondary | ICD-10-CM

## 2024-01-23 DIAGNOSIS — I252 Old myocardial infarction: Secondary | ICD-10-CM | POA: Diagnosis not present

## 2024-01-23 DIAGNOSIS — Z823 Family history of stroke: Secondary | ICD-10-CM

## 2024-01-23 DIAGNOSIS — R072 Precordial pain: Secondary | ICD-10-CM | POA: Diagnosis present

## 2024-01-23 DIAGNOSIS — I214 Non-ST elevation (NSTEMI) myocardial infarction: Principal | ICD-10-CM | POA: Diagnosis present

## 2024-01-23 DIAGNOSIS — R7989 Other specified abnormal findings of blood chemistry: Secondary | ICD-10-CM | POA: Diagnosis not present

## 2024-01-23 DIAGNOSIS — J918 Pleural effusion in other conditions classified elsewhere: Secondary | ICD-10-CM | POA: Diagnosis not present

## 2024-01-23 DIAGNOSIS — E876 Hypokalemia: Secondary | ICD-10-CM | POA: Diagnosis present

## 2024-01-23 DIAGNOSIS — N179 Acute kidney failure, unspecified: Secondary | ICD-10-CM | POA: Diagnosis not present

## 2024-01-23 DIAGNOSIS — R531 Weakness: Secondary | ICD-10-CM | POA: Diagnosis not present

## 2024-01-23 DIAGNOSIS — Z888 Allergy status to other drugs, medicaments and biological substances status: Secondary | ICD-10-CM

## 2024-01-23 DIAGNOSIS — R739 Hyperglycemia, unspecified: Secondary | ICD-10-CM | POA: Diagnosis not present

## 2024-01-23 DIAGNOSIS — Z48813 Encounter for surgical aftercare following surgery on the respiratory system: Secondary | ICD-10-CM | POA: Diagnosis not present

## 2024-01-23 DIAGNOSIS — J95811 Postprocedural pneumothorax: Secondary | ICD-10-CM | POA: Diagnosis not present

## 2024-01-23 DIAGNOSIS — I5033 Acute on chronic diastolic (congestive) heart failure: Secondary | ICD-10-CM | POA: Diagnosis present

## 2024-01-23 DIAGNOSIS — D6959 Other secondary thrombocytopenia: Secondary | ICD-10-CM | POA: Diagnosis present

## 2024-01-23 DIAGNOSIS — I700 Atherosclerosis of aorta: Secondary | ICD-10-CM | POA: Diagnosis not present

## 2024-01-23 DIAGNOSIS — R918 Other nonspecific abnormal finding of lung field: Secondary | ICD-10-CM | POA: Diagnosis not present

## 2024-01-23 DIAGNOSIS — R5381 Other malaise: Secondary | ICD-10-CM | POA: Diagnosis not present

## 2024-01-23 DIAGNOSIS — J9601 Acute respiratory failure with hypoxia: Secondary | ICD-10-CM | POA: Diagnosis not present

## 2024-01-23 DIAGNOSIS — I259 Chronic ischemic heart disease, unspecified: Secondary | ICD-10-CM | POA: Diagnosis not present

## 2024-01-23 DIAGNOSIS — T8111XA Postprocedural  cardiogenic shock, initial encounter: Secondary | ICD-10-CM | POA: Diagnosis not present

## 2024-01-23 DIAGNOSIS — Z91040 Latex allergy status: Secondary | ICD-10-CM

## 2024-01-23 DIAGNOSIS — K219 Gastro-esophageal reflux disease without esophagitis: Secondary | ICD-10-CM | POA: Diagnosis present

## 2024-01-23 DIAGNOSIS — J939 Pneumothorax, unspecified: Secondary | ICD-10-CM | POA: Diagnosis not present

## 2024-01-23 DIAGNOSIS — Z4682 Encounter for fitting and adjustment of non-vascular catheter: Secondary | ICD-10-CM | POA: Diagnosis not present

## 2024-01-23 DIAGNOSIS — I2510 Atherosclerotic heart disease of native coronary artery without angina pectoris: Secondary | ICD-10-CM | POA: Diagnosis present

## 2024-01-23 DIAGNOSIS — I5021 Acute systolic (congestive) heart failure: Secondary | ICD-10-CM | POA: Diagnosis not present

## 2024-01-23 DIAGNOSIS — I318 Other specified diseases of pericardium: Secondary | ICD-10-CM | POA: Diagnosis not present

## 2024-01-23 DIAGNOSIS — D72829 Elevated white blood cell count, unspecified: Secondary | ICD-10-CM | POA: Diagnosis not present

## 2024-01-23 DIAGNOSIS — Z8249 Family history of ischemic heart disease and other diseases of the circulatory system: Secondary | ICD-10-CM

## 2024-01-23 DIAGNOSIS — Z79890 Hormone replacement therapy (postmenopausal): Secondary | ICD-10-CM

## 2024-01-23 DIAGNOSIS — R2689 Other abnormalities of gait and mobility: Secondary | ICD-10-CM | POA: Diagnosis not present

## 2024-01-23 DIAGNOSIS — I5082 Biventricular heart failure: Secondary | ICD-10-CM | POA: Diagnosis present

## 2024-01-23 DIAGNOSIS — J810 Acute pulmonary edema: Secondary | ICD-10-CM | POA: Diagnosis not present

## 2024-01-23 DIAGNOSIS — R570 Cardiogenic shock: Secondary | ICD-10-CM | POA: Diagnosis not present

## 2024-01-23 DIAGNOSIS — J90 Pleural effusion, not elsewhere classified: Secondary | ICD-10-CM | POA: Diagnosis not present

## 2024-01-23 DIAGNOSIS — Z87891 Personal history of nicotine dependence: Secondary | ICD-10-CM

## 2024-01-23 DIAGNOSIS — D75838 Other thrombocytosis: Secondary | ICD-10-CM | POA: Diagnosis not present

## 2024-01-23 DIAGNOSIS — K5641 Fecal impaction: Secondary | ICD-10-CM | POA: Diagnosis not present

## 2024-01-23 DIAGNOSIS — F419 Anxiety disorder, unspecified: Secondary | ICD-10-CM | POA: Diagnosis present

## 2024-01-23 DIAGNOSIS — F32A Depression, unspecified: Secondary | ICD-10-CM | POA: Diagnosis present

## 2024-01-23 DIAGNOSIS — Z7982 Long term (current) use of aspirin: Secondary | ICD-10-CM

## 2024-01-23 DIAGNOSIS — J95821 Acute postprocedural respiratory failure: Secondary | ICD-10-CM | POA: Diagnosis not present

## 2024-01-23 DIAGNOSIS — I25110 Atherosclerotic heart disease of native coronary artery with unstable angina pectoris: Secondary | ICD-10-CM | POA: Diagnosis not present

## 2024-01-23 DIAGNOSIS — E871 Hypo-osmolality and hyponatremia: Secondary | ICD-10-CM | POA: Diagnosis not present

## 2024-01-23 DIAGNOSIS — M79671 Pain in right foot: Secondary | ICD-10-CM | POA: Diagnosis not present

## 2024-01-23 DIAGNOSIS — Z48812 Encounter for surgical aftercare following surgery on the circulatory system: Secondary | ICD-10-CM | POA: Diagnosis not present

## 2024-01-23 DIAGNOSIS — R079 Chest pain, unspecified: Secondary | ICD-10-CM | POA: Diagnosis not present

## 2024-01-23 DIAGNOSIS — Z79899 Other long term (current) drug therapy: Secondary | ICD-10-CM

## 2024-01-23 DIAGNOSIS — Z01810 Encounter for preprocedural cardiovascular examination: Secondary | ICD-10-CM | POA: Diagnosis not present

## 2024-01-23 DIAGNOSIS — E039 Hypothyroidism, unspecified: Secondary | ICD-10-CM | POA: Diagnosis present

## 2024-01-23 DIAGNOSIS — M810 Age-related osteoporosis without current pathological fracture: Secondary | ICD-10-CM | POA: Diagnosis present

## 2024-01-23 DIAGNOSIS — E785 Hyperlipidemia, unspecified: Secondary | ICD-10-CM | POA: Diagnosis present

## 2024-01-23 DIAGNOSIS — R54 Age-related physical debility: Secondary | ICD-10-CM | POA: Diagnosis present

## 2024-01-23 DIAGNOSIS — I517 Cardiomegaly: Secondary | ICD-10-CM | POA: Diagnosis not present

## 2024-01-23 DIAGNOSIS — K5900 Constipation, unspecified: Secondary | ICD-10-CM | POA: Diagnosis not present

## 2024-01-23 DIAGNOSIS — Z741 Need for assistance with personal care: Secondary | ICD-10-CM | POA: Diagnosis not present

## 2024-01-23 DIAGNOSIS — F418 Other specified anxiety disorders: Secondary | ICD-10-CM | POA: Diagnosis not present

## 2024-01-23 DIAGNOSIS — Z825 Family history of asthma and other chronic lower respiratory diseases: Secondary | ICD-10-CM

## 2024-01-23 LAB — CBC
HCT: 43.7 % (ref 36.0–46.0)
Hemoglobin: 14.2 g/dL (ref 12.0–15.0)
MCH: 31.8 pg (ref 26.0–34.0)
MCHC: 32.5 g/dL (ref 30.0–36.0)
MCV: 98 fL (ref 80.0–100.0)
Platelets: 301 10*3/uL (ref 150–400)
RBC: 4.46 MIL/uL (ref 3.87–5.11)
RDW: 13.2 % (ref 11.5–15.5)
WBC: 12.6 10*3/uL — ABNORMAL HIGH (ref 4.0–10.5)
nRBC: 0 % (ref 0.0–0.2)

## 2024-01-23 LAB — TROPONIN I (HIGH SENSITIVITY)
Troponin I (High Sensitivity): 229 ng/L (ref <18)
Troponin I (High Sensitivity): 2503 ng/L (ref <18)

## 2024-01-23 LAB — HEPARIN LEVEL (UNFRACTIONATED): Heparin Unfractionated: 0.36 [IU]/mL (ref 0.30–0.70)

## 2024-01-23 LAB — BASIC METABOLIC PANEL WITH GFR
Anion gap: 11 (ref 5–15)
BUN: 20 mg/dL (ref 8–23)
CO2: 24 mmol/L (ref 22–32)
Calcium: 9.8 mg/dL (ref 8.9–10.3)
Chloride: 100 mmol/L (ref 98–111)
Creatinine, Ser: 0.87 mg/dL (ref 0.44–1.00)
GFR, Estimated: >60 mL/min (ref >60)
Glucose, Bld: 161 mg/dL — ABNORMAL HIGH (ref 70–99)
Potassium: 3.7 mmol/L (ref 3.5–5.1)
Sodium: 135 mmol/L (ref 135–145)

## 2024-01-23 LAB — TSH: TSH: 0.863 u[IU]/mL (ref 0.350–4.500)

## 2024-01-23 LAB — T4, FREE: Free T4: 1 ng/dL (ref 0.61–1.12)

## 2024-01-23 MED ORDER — ACETAMINOPHEN 650 MG RE SUPP: 650.0000 mg | Freq: Four times a day (QID) | RECTAL | Status: DC | PRN

## 2024-01-23 MED ORDER — METOPROLOL TARTRATE 25 MG PO TABS
25.0000 mg | ORAL_TABLET | Freq: Two times a day (BID) | ORAL | Status: DC
  Administered 2024-01-23 – 2024-01-26 (×6): 25 mg via ORAL
  Filled 2024-01-23 (×6): qty 1

## 2024-01-23 MED ORDER — ONDANSETRON HCL 4 MG PO TABS: 4.0000 mg | ORAL_TABLET | Freq: Four times a day (QID) | ORAL | Status: DC | PRN

## 2024-01-23 MED ORDER — ONDANSETRON HCL 4 MG/2ML IJ SOLN: 4.0000 mg | Freq: Four times a day (QID) | INTRAMUSCULAR | Status: DC | PRN

## 2024-01-23 MED ORDER — ALPRAZOLAM 0.5 MG PO TABS
1.0000 mg | ORAL_TABLET | Freq: Once | ORAL | Status: AC
  Administered 2024-01-23: 1 mg via ORAL
  Filled 2024-01-23: qty 2

## 2024-01-23 MED ORDER — HEPARIN BOLUS VIA INFUSION
3400.0000 [IU] | Freq: Once | INTRAVENOUS | Status: AC
  Administered 2024-01-23: 3400 [IU] via INTRAVENOUS
  Filled 2024-01-23: qty 3400

## 2024-01-23 MED ORDER — ACETAMINOPHEN 325 MG PO TABS
650.0000 mg | ORAL_TABLET | Freq: Four times a day (QID) | ORAL | Status: DC | PRN
  Administered 2024-01-23 – 2024-01-24 (×2): 650 mg via ORAL
  Filled 2024-01-23 (×3): qty 2

## 2024-01-23 MED ORDER — LEVOTHYROXINE SODIUM 88 MCG PO TABS
88.0000 ug | ORAL_TABLET | Freq: Every day | ORAL | Status: DC
Start: 2024-01-24 — End: 2024-01-27
  Administered 2024-01-24 – 2024-01-27 (×4): 88 ug via ORAL
  Filled 2024-01-23 (×5): qty 1

## 2024-01-23 MED ORDER — ASPIRIN 81 MG PO TBEC: 81.0000 mg | DELAYED_RELEASE_TABLET | Freq: Every day | ORAL | Status: DC

## 2024-01-23 MED ORDER — HYDROXYZINE HCL 10 MG PO TABS
10.0000 mg | ORAL_TABLET | Freq: Every day | ORAL | Status: DC
  Administered 2024-01-23 – 2024-01-26 (×4): 10 mg via ORAL
  Filled 2024-01-23 (×5): qty 1

## 2024-01-23 MED ORDER — LATANOPROST 0.005 % OP SOLN
1.0000 [drp] | Freq: Every day | OPHTHALMIC | Status: DC
  Administered 2024-01-24 – 2024-02-14 (×22): 1 [drp] via OPHTHALMIC
  Filled 2024-01-23 (×2): qty 2.5

## 2024-01-23 MED ORDER — LORAZEPAM 1 MG PO TABS: 1.0000 mg | ORAL_TABLET | Freq: Every day | ORAL | Status: DC | PRN

## 2024-01-23 MED ORDER — ALPRAZOLAM 0.5 MG PO TABS
1.0000 mg | ORAL_TABLET | Freq: Every day | ORAL | Status: DC
  Administered 2024-01-23 – 2024-01-26 (×4): 1 mg via ORAL
  Filled 2024-01-23 (×4): qty 2

## 2024-01-23 MED ORDER — SENNOSIDES-DOCUSATE SODIUM 8.6-50 MG PO TABS: 1.0000 | ORAL_TABLET | Freq: Every evening | ORAL | Status: DC | PRN

## 2024-01-23 MED ORDER — ATORVASTATIN CALCIUM 80 MG PO TABS
80.0000 mg | ORAL_TABLET | Freq: Every day | ORAL | Status: DC
  Administered 2024-01-23 – 2024-01-27 (×5): 80 mg via ORAL
  Filled 2024-01-23: qty 1
  Filled 2024-01-23: qty 2
  Filled 2024-01-23 (×3): qty 1

## 2024-01-23 MED ORDER — HEPARIN (PORCINE) 25000 UT/250ML-% IV SOLN
700.0000 [IU]/h | INTRAVENOUS | Status: DC
Start: 2024-01-23 — End: 2024-01-24
  Administered 2024-01-23: 700 [IU]/h via INTRAVENOUS
  Filled 2024-01-23: qty 250

## 2024-01-23 MED ORDER — ALPRAZOLAM 0.5 MG PO TABS
1.0000 mg | ORAL_TABLET | Freq: Every day | ORAL | Status: DC | PRN
  Administered 2024-01-24: 1 mg via ORAL
  Filled 2024-01-23: qty 2

## 2024-01-23 NOTE — ED Notes (Signed)
 Notified provider trop 229

## 2024-01-23 NOTE — ED Notes (Signed)
 Pt ambulatory w/o assist. Steady gait. No complaints

## 2024-01-23 NOTE — Progress Notes (Signed)
 PHARMACY - ANTICOAGULATION CONSULT NOTE  Pharmacy Consult for Heparin Indication: chest pain/ACS  Allergies  Allergen Reactions   Naprosyn  [Naproxen ]     Upset stomach    Latex Rash    Patient Measurements: Height: 5\' 3"  (160 cm) Weight: 56.7 kg (125 lb) IBW/kg (Calculated) : 52.4 HEPARIN DW (KG): 56.7  Vital Signs: Temp: 97.8 F (36.6 C) (05/05 1242) Temp Source: Oral (05/05 1242) BP: 150/70 (05/05 1200) Pulse Rate: 83 (05/05 1215)  Labs: Recent Labs    01/23/24 0920 01/23/24 1148  HGB 14.2  --   HCT 43.7  --   PLT 301  --   CREATININE 0.87  --   TROPONINIHS 229* 2,503*    Estimated Creatinine Clearance: 42.7 mL/min (by C-G formula based on SCr of 0.87 mg/dL).   Medical History: Past Medical History:  Diagnosis Date   Anxiety    Depression    Hypothyroidism     Assessment: Active Problem(s): CP, elevated troponin AC/Heme: CP with +troponin. Baseline CBC WNL.Troponins 229>2503  Goal of Therapy:  Heparin level 0.3-0.7 units/ml Monitor platelets by anticoagulation protocol: Yes   Plan:  Heparin 3400 units IV bolus Heparin infusion at 700 units/hr Check heparin level in 8 hrs. Daily HL and CBC   [name] Shall S. Zannie Hey, PharmD, BCPS Clinical Staff Pharmacist Enis Harsh Stillinger 01/23/2024,1:33 PM

## 2024-01-23 NOTE — Progress Notes (Signed)
 Risk and benefit of cardiac catheterization was discussed with the patient including 1:200 chance of major bleeding. 1:500 chance of kidney injury, vascular injury or arrhythmia. 1:1000 chance of MI, stroke, loss of life or limb.

## 2024-01-23 NOTE — ED Triage Notes (Signed)
 Pt arrived with cp, mid sternal that started 0300. States she has had this before and had cardiologist work up. Reports she took aspirin and xanax when it started. Pain does not radiate and is not accompanied by any other symptoms per pt.

## 2024-01-23 NOTE — ED Provider Notes (Signed)
 [facility] AT [facility] Provider Note  CSN: [id number] Arrival date & time: 01/23/24 8295  Chief Complaint(s) Chest Pain  HPI Kaitlyn Wright is a 78 y.o. female with past medical history as below, significant for anxiety, depression, hypothyroid who presents to the ED with complaint of cp  Pt reports around 0300 she had pressure/gas sensation mid sternum. Felt like she needed to burp. Pressure sensation that did not radiate. Not a/w palpitations, dyspnea, syncope/near syncope. Symptoms have since resolved. No n/v. No change to bowel/bladder fxn, no fevers or chills  Pt reports she ate dinner late, was spaghetti and meatballs with some ice cream.   Past Medical History Past Medical History:  Diagnosis Date   Anxiety    Depression    Hypothyroidism    There are no active problems to display for this patient.  Home Medication(s) Prior to Admission medications   Medication Sig Start Date End Date Taking? Authorizing Provider  acetaminophen  (TYLENOL ) 500 MG tablet Take 1,000 mg by mouth See admin instructions. Take 1000mg  (2 tablets) by mouth every morning and 1000mg  (2 tablets) at night if needed for pain.   Yes [provider]  ALPRAZolam (XANAX) 1 MG tablet Take 1 mg by mouth See admin instructions. Take 1mg  (1 tablet) by mouth every night and 1mg  (1 tablet) during the day if needed.   Yes [provider]  aspirin EC 325 MG tablet Take 325-650 mg by mouth daily as needed for moderate pain (pain score 4-6).   Yes [provider]  aspirin EC 81 MG tablet Take 81 mg by mouth every morning.   Yes [provider]  B Complex-C (B-COMPLEX WITH VITAMIN C) tablet Take 1 tablet by mouth daily.   Yes [provider]  cholecalciferol (VITAMIN D) 1000 UNITS tablet Take 1,000 Units by mouth daily.   Yes [provider]  Cinnamon 500 MG capsule Take 500 mg by mouth daily.   Yes [provider]  hydrOXYzine  (ATARAX) 10 MG tablet Take 10 mg by mouth at bedtime. 01/03/24  Yes [provider]  Oksana Bergamo Oil 1000 MG CAPS Take 1 capsule by mouth daily.   Yes [provider]  latanoprost (XALATAN) 0.005 % ophthalmic solution Place 1 drop into both eyes at bedtime. 09/16/23  Yes [provider]  levothyroxine (SYNTHROID) 88 MCG tablet Take 88 mcg by mouth daily.   Yes [provider]  loperamide (IMODIUM) 1 MG/5ML solution Take 5 mLs by mouth daily as needed for diarrhea or loose stools.   Yes [provider]  Omega-3 Fatty Acids (FISH OIL) 1000 MG CPDR Take 1 capsule by mouth 2 (two) times daily.   Yes [provider]  Polyethyl Glycol-Propyl Glycol (SYSTANE FREE OP) Place 1 drop into both eyes 2 (two) times daily.   Yes [provider]  Red Yeast Rice 600 MG CAPS Take 2 capsules by mouth 2 (two) times daily.   Yes [provider]  vitamin C (ASCORBIC ACID) 500 MG tablet Take 500 mg by mouth daily.   Yes [provider]  cyclobenzaprine (FLEXERIL) 5 MG tablet Take 5 mg by mouth at bedtime as needed for muscle spasms. Patient not taking: Reported on 01/23/2024    [provider]  pantoprazole (PROTONIX) 40 MG tablet Take 40 mg by mouth daily. Patient not taking: Reported on 01/23/2024 09/02/23   [provider]  phenazopyridine (PYRIDIUM) 200 MG tablet Take 200 mg by mouth 3 (three) times  daily. Patient not taking: Reported on 01/23/2024 12/26/23   [provider]  sulfamethoxazole-trimethoprim (BACTRIM DS) 800-160 MG tablet Take 1 tablet by mouth 2 (two) times daily. Patient not taking: Reported on 01/23/2024 01/05/24   [provider]                                                                                                                                    Past Surgical History No past surgical history on file. Family History No family history on file.  Social History Social History   Tobacco  Use   Smoking status: Never   Smokeless tobacco: Never  Vaping Use   Vaping status: Never Used  Substance Use Topics   Alcohol use: No   Drug use: Never   Allergies Naprosyn  [naproxen ] and Latex  Review of Systems A thorough review of systems was obtained and all systems are negative except as noted in the HPI and PMH.   Physical Exam Vital Signs  I have reviewed the triage vital signs BP (!) 158/85   Pulse 87   Temp 97.8 F (36.6 C) (Oral)   Resp 15   Ht 5\' 3"  (1.6 m)   Wt 56.7 kg   SpO2 98%   BMI 22.14 kg/m  Physical Exam Vitals and nursing note reviewed.  Constitutional:      General: She is not in acute distress.    Appearance: Normal appearance. She is well-developed.  HENT:     Head: Normocephalic and atraumatic.     Right Ear: External ear normal.     Left Ear: External ear normal.     Nose: Nose normal.     Mouth/Throat:     Mouth: Mucous membranes are moist.  Eyes:     General: No scleral icterus.       Right eye: No discharge.        Left eye: No discharge.  Cardiovascular:     Rate and Rhythm: Normal rate and regular rhythm.     Pulses: Normal pulses.     Heart sounds: Normal heart sounds.  Pulmonary:     Effort: Pulmonary effort is normal. No respiratory distress.     Breath sounds: Normal breath sounds. No stridor. No decreased breath sounds or wheezing.  Abdominal:     General: Abdomen is flat. There is no distension.     Palpations: Abdomen is soft.     Tenderness: There is no abdominal tenderness.  Musculoskeletal:     Cervical back: No rigidity.     Right lower leg: No edema.     Left lower leg: No edema.  Skin:    General: Skin is warm and dry.     Capillary Refill: Capillary refill takes less than 2 seconds.  Neurological:     Mental Status: She is alert.  Psychiatric:        Mood and Affect: Mood normal.  Behavior: Behavior normal. Behavior is cooperative.     ED Results and Treatments Labs (all labs ordered are listed,  but only abnormal results are displayed) Labs Reviewed  BASIC METABOLIC PANEL WITH GFR - Abnormal; Notable for the following components:      Result Value   Glucose, Bld 161 (*)    All other components within normal limits  CBC - Abnormal; Notable for the following components:   WBC 12.6 (*)    All other components within normal limits  TROPONIN I (HIGH SENSITIVITY) - Abnormal; Notable for the following components:   Troponin I (High Sensitivity) 229 (*)    All other components within normal limits  TROPONIN I (HIGH SENSITIVITY) - Abnormal; Notable for the following components:   Troponin I (High Sensitivity) 2,503 (*)    All other components within normal limits  TSH  T4, FREE  HEPARIN LEVEL (UNFRACTIONATED)                                                                                                                          Radiology DG Chest Port 1 View Result Date: 01/23/2024 CLINICAL DATA:  Chest pain. EXAM: PORTABLE CHEST 1 VIEW COMPARISON:  12/27/2013. FINDINGS: The heart size and mediastinal contours are within normal limits. Aortic atherosclerosis. Similar eventration of the right hemidiaphragm. Mild bibasilar atelectasis. No focal consolidation, sizeable pleural effusion, or pneumothorax. Remote healed right-sided rib fractures. No acute osseous abnormality. IMPRESSION: Mild bibasilar atelectasis. Otherwise, no acute cardiopulmonary findings. Electronically Signed   By: Mannie Seek M.D.   On: 01/23/2024 10:08    Pertinent labs & imaging results that were available during my care of the patient were reviewed by me and considered in my medical decision making (see MDM for details).  Medications Ordered in ED Medications  heparin ADULT infusion 100 units/mL (25000 units/250mL) (700 Units/hr Intravenous New Bag/Given 01/23/24 1433)  aspirin EC tablet 81 mg (has no administration in time range)  metoprolol tartrate (LOPRESSOR) tablet 25 mg (has no administration in time range)   atorvastatin (LIPITOR) tablet 80 mg (has no administration in time range)  ALPRAZolam (XANAX) tablet 1 mg (1 mg Oral Given 01/23/24 1318)  heparin bolus via infusion 3,400 Units (3,400 Units Intravenous Bolus from Bag 01/23/24 1433)  Procedures .Critical Care  Performed by: Teddi Favors, DO Authorized by: Teddi Favors, DO   Critical care provider statement:    Critical care time (minutes):  42   Critical care time was exclusive of:  Separately billable procedures and treating other patients   Critical care was necessary to treat or prevent imminent or life-threatening deterioration of the following conditions:  Cardiac failure   Critical care was time spent personally by me on the following activities:  Development of treatment plan with patient or surrogate, discussions with consultants, evaluation of patient's response to treatment, examination of patient, ordering and review of laboratory studies, ordering and review of radiographic studies, ordering and performing treatments and interventions, pulse oximetry, re-evaluation of patient's condition, review of old charts and obtaining history from patient or surrogate   Care discussed with: admitting provider     (including critical care time)  Medical Decision Making / ED Course    Medical Decision Making:    Kinberli Rawls is a 78 y.o. female with past medical history as below, significant for anxiety, depression, hypothyroid who presents to the ED with complaint of cp. The complaint involves an extensive differential diagnosis and also carries with it a high risk of complications and morbidity.  Serious etiology was considered. Ddx includes but is not limited to: Differential includes all life-threatening causes for chest pain. This includes but is not exclusive to acute coronary syndrome, aortic dissection,  pulmonary embolism, cardiac tamponade, community-acquired pneumonia, pericarditis, musculoskeletal chest wall pain, etc.   Complete initial physical exam performed, notably the patient was in no distress, asymptomatic.    Reviewed and confirmed nursing documentation for past medical history, family history, social history.  Vital signs reviewed.     Brief summary:  78 year old female history above senting with chest pain.  Symptoms have since resolved.  Pressure sensation non-radiating.   exam stable  Clinical Course as of 01/23/24 1616  Mon Jan 23, 2024  1019 EKG with mild inferior ST elevation, she has no chest pain [SG]  1020 Trop is elevated 229, she took asa prior to arrival, no chest pain at this time  [SG]  1134 Repeat EKG was without STEMI, she remains pain free [SG]  1321 Spoke w/ Dr Alroy Aspen, will see if colleague at [facility] can come see. She will need [facility] tomorrow. Agree w/ start heparin. [SG]    Clinical Course User Index [SG] Teddi Favors, DO     Trop 229 > 2503, she does not have any chest pain, reports that she feels "fine." She did have sudden onset discomfort early this AM which has since resolved. Does not follow w/ cardiology, she reports she had a cardiac monitor to evaluate for palpitations in the past but was told it looked okay and did not need any further treatment/medications  She does not have any chest pain currently, will d/w cardiology regarding heparin, she took asa pta  Spoke with Dr Ross/Dr Alroy Aspen, recommend [facility] admit, plan for [facility] tomorrow.   [facility] accepting for admit            Additional history obtained: -Additional history obtained from family -External records from outside source obtained and reviewed including: Chart review including previous notes, labs, imaging, consultation notes including  Primary care documentation Home meds   Lab Tests: -I ordered, reviewed, and interpreted labs.   The pertinent results include:   Labs Reviewed   BASIC METABOLIC PANEL WITH GFR - Abnormal; Notable for the following components:  Result Value   Glucose, Bld 161 (*)    All other components within normal limits  CBC - Abnormal; Notable for the following components:   WBC 12.6 (*)    All other components within normal limits  TROPONIN I (HIGH SENSITIVITY) - Abnormal; Notable for the following components:   Troponin I (High Sensitivity) 229 (*)    All other components within normal limits  TROPONIN I (HIGH SENSITIVITY) - Abnormal; Notable for the following components:   Troponin I (High Sensitivity) 2,503 (*)    All other components within normal limits  TSH  T4, FREE  HEPARIN LEVEL (UNFRACTIONATED)    Notable for as above  EKG   EKG Interpretation Date/Time:  Monday Jan 23 2024 10:27:28 EDT Ventricular Rate:  77 PR Interval:  145 QRS Duration:  84 QT Interval:  381 QTC Calculation: 432 R Axis:   51  Text Interpretation: Sinus rhythm Nonspecific T abnormalities, diffuse leads t wave inversion noted diffusely Confirmed by Russella Courts ([id number]) on 01/23/2024 10:44:22 AM         Imaging Studies ordered: I ordered imaging studies including cxr I independently visualized the following imaging with scope of interpretation limited to determining acute life threatening conditions related to emergency care; findings noted above I agree with the radiologist interpretation If any imaging was obtained with contrast I closely monitored patient for any possible adverse reaction a/w contrast administration in the emergency department   Medicines ordered and prescription drug management: Meds ordered this encounter  Medications   ALPRAZolam (XANAX) tablet 1 mg   heparin bolus via infusion 3,400 Units   heparin ADULT infusion 100 units/mL (25000 units/250mL)   aspirin EC tablet 81 mg   metoprolol tartrate (LOPRESSOR) tablet 25 mg   atorvastatin (LIPITOR) tablet 80 mg    -I have reviewed the patients home medicines and have made  adjustments as needed   Consultations Obtained: I requested consultation with the cardiology,  and discussed lab and imaging findings as well as pertinent plan - they recommend: admit, [facility] tomorrow   Cardiac Monitoring: The patient was maintained on a cardiac monitor.  I personally viewed and interpreted the cardiac monitored which showed an underlying rhythm of: nsr Continuous pulse oximetry interpreted by myself, 99% on RA.    Social Determinants of Health:  Diagnosis or treatment significantly limited by social determinants of health: na   Reevaluation: After the interventions noted above, I reevaluated the patient and found that they have improved  Co morbidities that complicate the patient evaluation  Past Medical History:  Diagnosis Date   Anxiety    Depression    Hypothyroidism       Dispostion: Disposition decision including need for hospitalization was considered, and patient admitted to the hospital.    Final Clinical Impression(s) / ED Diagnoses Final diagnoses:  Elevated troponin  NSTEMI (non-ST elevated myocardial infarction) (HCC)        Teddi Favors, DO 01/23/24 1616

## 2024-01-23 NOTE — H&P (View-Only) (Signed)
 Cardiology Consultation   Patient ID: Kaitlyn Wright MRN: [id number]; DOB: Feb 12, 1946  Admit date: 01/23/2024 Date of Consult: 01/23/2024  PCP:  Kaitlyn Congo, MD   [facility] Providers Cardiologist:  New to [facility] - Dr. Avanell Wright    Patient Profile:   Kaitlyn Wright is a 78 y.o. female with a hx of anxiety/depression and hypothyroidism who is being seen 01/23/2024 for the evaluation of chest pain at the request of Dr. Martina Wright.  History of Present Illness:   Kaitlyn Wright is a 78 year old female with past medical history of anxiety/depression and hypothyroidism.  She has no prior cardiac history.  She does not smoke, drink or do any illicit drug.  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  1 to 2 weeks ago, she woke up 1 morning with mild substernal chest discomfort, symptom went away after a few minutes, she did take a 325 mg aspirin and alprazolam.  She has been doing well for the past few weeks.  She woke up around 3:30 AM this morning having more severe substernal chest pressure.  At this time it did not go away despite Tylenol , alprazolam and high-dose aspirin.  This prompted the patient to seek urgent medical attention at [facility], ED.  She will report it was 229---> 2503.  TSH and free T4 were normal.  Chest x-ray showed mild bibasilar atelectasis.  Blood work showed creatinine of 0.87, potassium 3.7.  White blood cell count 12.6.  Normal hemoglobin.  EKG showed sinus rhythm with a T wave inversion in the inferolateral leads.  Patient says she had persistent chest pain from 3:30AM until when she arrived at ED prior to 9 AM.  She is currently chest pain-free.  Cardiology service consulted for NSTEMI.   Past Medical History:  Diagnosis Date   Anxiety    Depression    Hypothyroidism     No past surgical history on file.   Home Medications:  Prior to Admission medications   Medication Sig Start Date End Date Taking? Authorizing Provider   acetaminophen  (TYLENOL ) 500 MG tablet Take 1,000 mg by mouth See admin instructions. Take 1000mg  (2 tablets) by mouth every morning and 1000mg  (2 tablets) at night if needed for pain.   Yes [provider]  ALPRAZolam (XANAX) 1 MG tablet Take 1 mg by mouth See admin instructions. Take 1mg  (1 tablet) by mouth every night and 1mg  (1 tablet) during the day if needed.   Yes [provider]  aspirin EC 325 MG tablet Take 325-650 mg by mouth daily as needed for moderate pain (pain score 4-6).   Yes [provider]  aspirin EC 81 MG tablet Take 81 mg by mouth every morning.   Yes [provider]  B Complex-C (B-COMPLEX WITH VITAMIN C) tablet Take 1 tablet by mouth daily.   Yes [provider]  cholecalciferol (VITAMIN D) 1000 UNITS tablet Take 1,000 Units by mouth daily.   Yes [provider]  Cinnamon 500 MG capsule Take 500 mg by mouth daily.   Yes [provider]  hydrOXYzine (ATARAX) 10 MG tablet Take 10 mg by mouth at bedtime. 01/03/24  Yes [provider]  Oksana Bergamo Oil 1000 MG CAPS Take 1 capsule by mouth daily.   Yes [provider]  latanoprost (XALATAN) 0.005 % ophthalmic solution Place 1 drop into both eyes at bedtime. 09/16/23  Yes [provider]  levothyroxine (SYNTHROID) 88 MCG tablet Take 88 mcg  by mouth daily.   Yes [provider]  loperamide (IMODIUM) 1 MG/5ML solution Take 5 mLs by mouth daily as needed for diarrhea or loose stools.   Yes [provider]  Omega-3 Fatty Acids (FISH OIL) 1000 MG CPDR Take 1 capsule by mouth 2 (two) times daily.   Yes [provider]  Polyethyl Glycol-Propyl Glycol (SYSTANE FREE OP) Place 1 drop into both eyes 2 (two) times daily.   Yes [provider]  Red Yeast Rice 600 MG CAPS Take 2 capsules by mouth 2 (two) times daily.   Yes [provider]  vitamin C (ASCORBIC ACID) 500 MG tablet Take 500 mg by mouth daily.   Yes  [provider]  cyclobenzaprine (FLEXERIL) 5 MG tablet Take 5 mg by mouth at bedtime as needed for muscle spasms. Patient not taking: Reported on 01/23/2024    [provider]  pantoprazole (PROTONIX) 40 MG tablet Take 40 mg by mouth daily. Patient not taking: Reported on 01/23/2024 09/02/23   [provider]  phenazopyridine (PYRIDIUM) 200 MG tablet Take 200 mg by mouth 3 (three) times daily. Patient not taking: Reported on 01/23/2024 12/26/23   [provider]  sulfamethoxazole-trimethoprim (BACTRIM DS) 800-160 MG tablet Take 1 tablet by mouth 2 (two) times daily. Patient not taking: Reported on 01/23/2024 01/05/24   [provider]    Inpatient Medications: Scheduled Meds:   Continuous Infusions:  heparin 700 Units/hr (01/23/24 1433)   PRN Meds:   Allergies:    Allergies  Allergen Reactions   Naprosyn  [Naproxen ]     Upset stomach    Latex Rash    Social History:   Social History   Socioeconomic History   Marital status: Married    Spouse name: Not on file   Number of children: Not on file   Years of education: Not on file   Highest education level: Not on file  Occupational History   Not on file  Tobacco Use   Smoking status: Never   Smokeless tobacco: Never  Vaping Use   Vaping status: Never Used  Substance and Sexual Activity   Alcohol use: No   Drug use: Never   Sexual activity: Not on file  Other Topics Concern   Not on file  Social History Narrative   Not on file   Social Drivers of Health   Financial Resource Strain: Not on file  Food Insecurity: Not on file  Transportation Needs: Not on file  Physical Activity: Not on file  Stress: Not on file  Social Connections: Not on file  Intimate Partner Violence: Not on file    Family History:   No family history on file.   ROS:  Please see the history of present illness.   All other ROS reviewed and negative.     Physical Exam/Data:   Vitals:   01/23/24  1200 01/23/24 1215 01/23/24 1242 01/23/24 1322  BP: (!) 150/70     Pulse: 81 83    Resp: (!) 26 (!) 23    Temp:   97.8 F (36.6 C)   TempSrc:   Oral   SpO2: 97% 95%    Weight:    56.7 kg  Height:    5\' 3"  (1.6 m)   No intake or output data in the 24 hours ending 01/23/24 1440    01/23/2024    1:22 PM 12/10/2022    5:39 PM 04/19/2015   10:37 AM  Last 3 Weights  Weight (lbs) 125  lb 136 lb 138 lb  Weight (kg) 56.7 kg 61.689 kg 62.596 kg     Body mass index is 22.14 kg/m.  General:  Well nourished, well developed, in no acute distress HEENT: normal Neck: no JVD Vascular: No carotid bruits; Distal pulses 2+ bilaterally Cardiac:  normal S1, S2; RRR; no murmur  Lungs:  clear to auscultation bilaterally, no wheezing, rhonchi or rales  Abd: soft, nontender, no hepatomegaly  Ext: no edema Musculoskeletal:  No deformities, BUE and BLE strength normal and equal Skin: warm and dry  Neuro:  CNs 2-12 intact, no focal abnormalities noted Psych:  Normal affect   EKG:  The EKG was personally reviewed and demonstrates: Normal sinus rhythm, T wave inversion in the inferolateral leads. Telemetry:  Telemetry was personally reviewed and demonstrates: Normal sinus rhythm, no significant ventricular ectopy  Relevant CV Studies:   Laboratory Data:  High Sensitivity Troponin:   Recent Labs  Lab 01/23/24 0920 01/23/24 1148  TROPONINIHS 229* 2,503*     Chemistry Recent Labs  Lab 01/23/24 0920  NA 135  K 3.7  CL 100  CO2 24  GLUCOSE 161*  BUN 20  CREATININE 0.87  CALCIUM 9.8  GFRNONAA >60  ANIONGAP 11    No results for input(s): "PROT", "ALBUMIN", "AST", "ALT", "ALKPHOS", "BILITOT" in the last 168 hours. Lipids No results for input(s): "CHOL", "TRIG", "HDL", "LABVLDL", "LDLCALC", "CHOLHDL" in the last 168 hours.  Hematology Recent Labs  Lab 01/23/24 0920  WBC 12.6*  RBC 4.46  HGB 14.2  HCT 43.7  MCV 98.0  MCH 31.8  MCHC 32.5  RDW 13.2  PLT 301   Thyroid   Recent Labs   Lab 01/23/24 0920  TSH 0.863  FREET4 1.00    BNPNo results for input(s): "BNP", "PROBNP" in the last 168 hours.  DDimer No results for input(s): "DDIMER" in the last 168 hours.   Radiology/Studies:  DG Chest Port 1 View Result Date: 01/23/2024 CLINICAL DATA:  Chest pain. EXAM: PORTABLE CHEST 1 VIEW COMPARISON:  12/27/2013. FINDINGS: The heart size and mediastinal contours are within normal limits. Aortic atherosclerosis. Similar eventration of the right hemidiaphragm. Mild bibasilar atelectasis. No focal consolidation, sizeable pleural effusion, or pneumothorax. Remote healed right-sided rib fractures. No acute osseous abnormality. IMPRESSION: Mild bibasilar atelectasis. Otherwise, no acute cardiopulmonary findings. Electronically Signed   By: Mannie Seek M.D.   On: 01/23/2024 10:08     Assessment and Plan:   NSTEMI - Serial troponin 229--2503.  Symptom lasted from 3:30 AM until before 9 AM.  She is currently chest pain-free.  Continue IV heparin.  Obtain echocardiogram. - ASA, BB and high dose statin. FLP in AM - Plan for cardiac catheterization tomorrow.  Anxiety/depression   Risk Assessment/Risk Scores:     TIMI Risk Score for Unstable Angina or Non-ST Elevation MI:   The patient's TIMI risk score is 4, which indicates a 20% risk of all cause mortality, new or recurrent myocardial infarction or need for urgent revascularization in the next 14 days.    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 ED Pain free since arrival  Troponin peak so far  was 2503  She is currently comfortable but anxious   Denies CP  Breathing is good  Neck  JVP is normal Lungs are CTA Cardiac RRR  No S3  No murmurs Abd is supple  No masses Ext are without edema  2+ DP pulses  NSTEMI   Pt with sudden onset CP early this AM   Currently pain free  WOuld continue heparin and ecASA.   Add metoprolol and statin   WOud plan L heart cath tomorrow    Continue Xanax as needed for anxiety   Pt takes at home as needed  Ola Berger MD

## 2024-01-23 NOTE — H&P (Signed)
 History and Physical  Kaitlyn Wright ZOX:[id number] DOB: March 30, 1946 DOA: 01/23/2024  PCP: Roselind Congo, MD   Chief Complaint: Chest pain  HPI: Kaitlyn Wright is a 78 y.o. female with medical history significant for anxiety and depression, and hypothyroidism who presented to the ED for evaluation of chest pain. Patient reports that at about a week ago, she woke up in the morning with mild substernal chest discomfort and after she took aspirin 325 mg and 1 mg of alprazolam, it resolved.  Last night, she had a big meal around 8:30 PM and took her up alprazolam before bed.  She woke up around 3:30 AM with severe substernal chest pressure. She took 1 g of Tylenol , 1 mg of alprazolam and a 325 mg aspirin without significant relief so she presented to the ED for further evaluation. She endorse severe anxiety requiring her to take her alprazolam 1-2 times a day and nightly to sleep. Her chest pain has resolved and she denies any nausea, vomiting, sweats, dizziness, headaches, vision changes, shortness of breath, abdominal pain or palpitations. She does not smoke, drink alcohol or do any illicit drugs. She reports a family history of heart attack in her mother but reports she drank alcohol excessively.  ED Course: Initial vitals showed temp 98.2, RR 18, HR 64, BP 176/79, SpO2 98% on room air. Labs were significant for troponin to 229->2503, normal kidney function, WBC 12.6, Hgb 14.2, glucose 161, TSH 0.86, free T4 1.00.  EKG shows sinus rhythm with T wave inversions in the inferior lateral leads. CXR shows mild bibasilar atelectasis but no acute cardiopulmonary disease.  Cardiology was consulted for evaluation.  Patient was given alprazolam 1 mg x 1 and started on heparin drip.  TRH was consulted for admission.  Review of Systems: Please see HPI for pertinent positives and negatives. A complete 10 system review of systems are otherwise negative.  Past Medical History:  Diagnosis Date   Anxiety    Depression     Hypothyroidism    Past Surgical History:  Procedure Laterality Date   CHOLECYSTECTOMY     Social History:  reports that she has never smoked. She has never used smokeless tobacco. She reports that she does not drink alcohol and does not use drugs.  Allergies  Allergen Reactions   Naprosyn  [Naproxen ]     Upset stomach    Latex Rash    Family History  Problem Relation Age of Onset   Stroke Father      Prior to Admission medications   Medication Sig Start Date End Date Taking? Authorizing Provider  acetaminophen  (TYLENOL ) 500 MG tablet Take 1,000 mg by mouth See admin instructions. Take 1000mg  (2 tablets) by mouth every morning and 1000mg  (2 tablets) at night if needed for pain.   Yes [provider]  ALPRAZolam (XANAX) 1 MG tablet Take 1 mg by mouth See admin instructions. Take 1mg  (1 tablet) by mouth every night and 1mg  (1 tablet) during the day if needed.   Yes [provider]  aspirin EC 325 MG tablet Take 325-650 mg by mouth daily as needed for moderate pain (pain score 4-6).   Yes [provider]  aspirin EC 81 MG tablet Take 81 mg by mouth every morning.   Yes [provider]  B Complex-C (B-COMPLEX WITH VITAMIN C) tablet Take 1 tablet by mouth daily.   Yes [provider]  cholecalciferol (VITAMIN D) 1000 UNITS tablet Take 1,000 Units by mouth daily.   Yes [provider]  Cinnamon 500 MG capsule Take 500 mg by mouth daily.   Yes [provider]  hydrOXYzine (ATARAX) 10 MG tablet Take 10 mg by mouth at bedtime. 01/03/24  Yes [provider]  Oksana Bergamo Oil 1000 MG CAPS Take 1 capsule by mouth daily.   Yes [provider]  latanoprost (XALATAN) 0.005 % ophthalmic solution Place 1 drop into both eyes at bedtime. 09/16/23  Yes [provider]  levothyroxine (SYNTHROID) 88 MCG tablet Take 88 mcg by mouth daily.   Yes [provider]  loperamide (IMODIUM) 1 MG/5ML solution Take 5 mLs by  mouth daily as needed for diarrhea or loose stools.   Yes [provider]  Omega-3 Fatty Acids (FISH OIL) 1000 MG CPDR Take 1 capsule by mouth 2 (two) times daily.   Yes [provider]  Polyethyl Glycol-Propyl Glycol (SYSTANE FREE OP) Place 1 drop into both eyes 2 (two) times daily.   Yes [provider]  Red Yeast Rice 600 MG CAPS Take 2 capsules by mouth 2 (two) times daily.   Yes [provider]  vitamin C (ASCORBIC ACID) 500 MG tablet Take 500 mg by mouth daily.   Yes [provider]  pantoprazole (PROTONIX) 40 MG tablet Take 40 mg by mouth daily. Patient not taking: Reported on 01/23/2024 09/02/23   [provider]    Physical Exam: BP (!) 144/82   Pulse 89   Temp 97.8 F (36.6 C)   Resp 17   Ht 5\' 3"  (1.6 m)   Wt 56.7 kg   SpO2 96%   BMI 22.14 kg/m  General: Pleasant, well-appearing elderly woman laying in bed. No acute distress. HEENT: [place]/AT. Anicteric sclera CV: Mild tachycardia. Regular rhythm. No murmurs, rubs, or gallops. No LE edema Pulmonary: Lungs CTAB. Normal effort. No wheezing or rales. Abdominal: Soft, nontender, nondistended. Normal bowel sounds. Extremities: Palpable radial and DP pulses. Normal ROM. Skin: Warm and dry. No obvious rash or lesions. Neuro: A&Ox3. Moves all extremities. Normal sensation to light touch. No focal deficit. Psych: Anxious mood          Labs on Admission:  Basic Metabolic Panel: Recent Labs  Lab 01/23/24 0920  NA 135  K 3.7  CL 100  CO2 24  GLUCOSE 161*  BUN 20  CREATININE 0.87  CALCIUM 9.8   Liver Function Tests: No results for input(s): "AST", "ALT", "ALKPHOS", "BILITOT", "PROT", "ALBUMIN" in the last 168 hours. No results for input(s): "LIPASE", "AMYLASE" in the last 168 hours. No results for input(s): "AMMONIA" in the last 168 hours. CBC: Recent Labs  Lab 01/23/24 0920  WBC 12.6*  HGB 14.2  HCT 43.7  MCV 98.0  PLT 301   Cardiac Enzymes: No results for  input(s): "CKTOTAL", "CKMB", "CKMBINDEX", "TROPONINI" in the last 168 hours. BNP (last 3 results) No results for input(s): "BNP" in the last 8760 hours.  ProBNP (last 3 results) No results for input(s): "PROBNP" in the last 8760 hours.  CBG: No results for input(s): "GLUCAP" in the last 168 hours.  Radiological Exams on Admission: DG Chest Port 1 View Result Date: 01/23/2024 CLINICAL DATA:  Chest pain. EXAM: PORTABLE CHEST 1 VIEW COMPARISON:  12/27/2013. FINDINGS: The heart size and mediastinal contours are within normal limits. Aortic atherosclerosis. Similar eventration of the right hemidiaphragm. Mild bibasilar atelectasis. No focal consolidation, sizeable pleural effusion, or pneumothorax. Remote healed right-sided rib fractures. No acute osseous abnormality. IMPRESSION: Mild bibasilar atelectasis. Otherwise, no acute cardiopulmonary findings. Electronically Signed  By: Mannie Seek M.D.   On: 01/23/2024 10:08   Assessment/Plan Ivelisse Bogin is a 78 y.o. female with medical history significant for anxiety and depression, and hypothyroidism who presented to the ED for evaluation of chest pain and admitted for NSTEMI.  # NSTEMI - Presented after substernal chest pressure that woke her up from sleep - Troponin trending up from the 200s to 25,000s, EKG showing T wave inversions in the inferior lateral leads - Patient currently chest pain-free - Admit to cardiac telemetry at [facility] - Cardiology following, plan for left heart cath tomorrow - Follow-up echocardiogram - Continue heparin and aspirin - Start metoprolol and atorvastatin - Check lipid panel, LPA and A1c - Telemetry  # Anxiety and depression - Reports history of severe anxiety controlled by her as needed alprazolam - Continue alprazolam as needed for anxiety and daily at bedtime - Continue hydroxyzine daily at bedtime  # Hypothyroidism - Normal TSH and free T4 - Continue levothyroxine   DVT prophylaxis: Heparin     Code Status: Full Code  Consults called: Cardiology  Family Communication: Discussed admission with daughter at bedside  Severity of Illness: The appropriate patient status for this patient is INPATIENT. Inpatient status is judged to be reasonable and necessary in order to provide the required intensity of service to ensure the patient's safety. The patient's presenting symptoms, physical exam findings, and initial radiographic and laboratory data in the context of their chronic comorbidities is felt to place them at high risk for further clinical deterioration. Furthermore, it is not anticipated that the patient will be medically stable for discharge from the hospital within 2 midnights of admission.   * I certify that at the point of admission it is my clinical judgment that the patient will require inpatient hospital care spanning beyond 2 midnights from the point of admission due to high intensity of service, high risk for further deterioration and high frequency of surveillance required.*  Level of care: Telemetry Cardiac   This record has been created using Dragon voice recognition software. Errors have been sought and corrected, but may not always be located. Such creation errors do not reflect on the standard of care.   Vita Grip, MD 01/23/2024, 7:18 PM [facility] Pager: [phone] Isaiah 41:10   If 7PM-7AM, please contact night-coverage [email or web address] Password [email or web address]

## 2024-01-23 NOTE — Consult Note (Addendum)
 Cardiology Consultation   Patient ID: Kaitlyn Wright MRN: [id number]; DOB: Feb 12, 1946  Admit date: 01/23/2024 Date of Consult: 01/23/2024  PCP:  Roselind Congo, MD   [facility] Providers Cardiologist:  New to [facility] - Dr. Avanell Bob    Patient Profile:   Kaitlyn Wright is a 78 y.o. female with a hx of anxiety/depression and hypothyroidism who is being seen 01/23/2024 for the evaluation of chest pain at the request of Dr. Martina Sledge.  History of Present Illness:   Ms. [name] is a 78 year old female with past medical history of anxiety/depression and hypothyroidism.  She has no prior cardiac history.  She does not smoke, drink or do any illicit drug.  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  1 to 2 weeks ago, she woke up 1 morning with mild substernal chest discomfort, symptom went away after a few minutes, she did take a 325 mg aspirin and alprazolam.  She has been doing well for the past few weeks.  She woke up around 3:30 AM this morning having more severe substernal chest pressure.  At this time it did not go away despite Tylenol , alprazolam and high-dose aspirin.  This prompted the patient to seek urgent medical attention at [facility], ED.  She will report it was 229---> 2503.  TSH and free T4 were normal.  Chest x-ray showed mild bibasilar atelectasis.  Blood work showed creatinine of 0.87, potassium 3.7.  White blood cell count 12.6.  Normal hemoglobin.  EKG showed sinus rhythm with a T wave inversion in the inferolateral leads.  Patient says she had persistent chest pain from 3:30AM until when she arrived at ED prior to 9 AM.  She is currently chest pain-free.  Cardiology service consulted for NSTEMI.   Past Medical History:  Diagnosis Date   Anxiety    Depression    Hypothyroidism     No past surgical history on file.   Home Medications:  Prior to Admission medications   Medication Sig Start Date End Date Taking? Authorizing Provider   acetaminophen  (TYLENOL ) 500 MG tablet Take 1,000 mg by mouth See admin instructions. Take 1000mg  (2 tablets) by mouth every morning and 1000mg  (2 tablets) at night if needed for pain.   Yes [provider]  ALPRAZolam (XANAX) 1 MG tablet Take 1 mg by mouth See admin instructions. Take 1mg  (1 tablet) by mouth every night and 1mg  (1 tablet) during the day if needed.   Yes [provider]  aspirin EC 325 MG tablet Take 325-650 mg by mouth daily as needed for moderate pain (pain score 4-6).   Yes [provider]  aspirin EC 81 MG tablet Take 81 mg by mouth every morning.   Yes [provider]  B Complex-C (B-COMPLEX WITH VITAMIN C) tablet Take 1 tablet by mouth daily.   Yes [provider]  cholecalciferol (VITAMIN D) 1000 UNITS tablet Take 1,000 Units by mouth daily.   Yes [provider]  Cinnamon 500 MG capsule Take 500 mg by mouth daily.   Yes [provider]  hydrOXYzine (ATARAX) 10 MG tablet Take 10 mg by mouth at bedtime. 01/03/24  Yes [provider]  Oksana Bergamo Oil 1000 MG CAPS Take 1 capsule by mouth daily.   Yes [provider]  latanoprost (XALATAN) 0.005 % ophthalmic solution Place 1 drop into both eyes at bedtime. 09/16/23  Yes [provider]  levothyroxine (SYNTHROID) 88 MCG tablet Take 88 mcg  by mouth daily.   Yes [provider]  loperamide (IMODIUM) 1 MG/5ML solution Take 5 mLs by mouth daily as needed for diarrhea or loose stools.   Yes [provider]  Omega-3 Fatty Acids (FISH OIL) 1000 MG CPDR Take 1 capsule by mouth 2 (two) times daily.   Yes [provider]  Polyethyl Glycol-Propyl Glycol (SYSTANE FREE OP) Place 1 drop into both eyes 2 (two) times daily.   Yes [provider]  Red Yeast Rice 600 MG CAPS Take 2 capsules by mouth 2 (two) times daily.   Yes [provider]  vitamin C (ASCORBIC ACID) 500 MG tablet Take 500 mg by mouth daily.   Yes  [provider]  cyclobenzaprine (FLEXERIL) 5 MG tablet Take 5 mg by mouth at bedtime as needed for muscle spasms. Patient not taking: Reported on 01/23/2024    [provider]  pantoprazole (PROTONIX) 40 MG tablet Take 40 mg by mouth daily. Patient not taking: Reported on 01/23/2024 09/02/23   [provider]  phenazopyridine (PYRIDIUM) 200 MG tablet Take 200 mg by mouth 3 (three) times daily. Patient not taking: Reported on 01/23/2024 12/26/23   [provider]  sulfamethoxazole-trimethoprim (BACTRIM DS) 800-160 MG tablet Take 1 tablet by mouth 2 (two) times daily. Patient not taking: Reported on 01/23/2024 01/05/24   [provider]    Inpatient Medications: Scheduled Meds:   Continuous Infusions:  heparin 700 Units/hr (01/23/24 1433)   PRN Meds:   Allergies:    Allergies  Allergen Reactions   Naprosyn  [Naproxen ]     Upset stomach    Latex Rash    Social History:   Social History   Socioeconomic History   Marital status: Married    Spouse name: Not on file   Number of children: Not on file   Years of education: Not on file   Highest education level: Not on file  Occupational History   Not on file  Tobacco Use   Smoking status: Never   Smokeless tobacco: Never  Vaping Use   Vaping status: Never Used  Substance and Sexual Activity   Alcohol use: No   Drug use: Never   Sexual activity: Not on file  Other Topics Concern   Not on file  Social History Narrative   Not on file   Social Drivers of Health   Financial Resource Strain: Not on file  Food Insecurity: Not on file  Transportation Needs: Not on file  Physical Activity: Not on file  Stress: Not on file  Social Connections: Not on file  Intimate Partner Violence: Not on file    Family History:   No family history on file.   ROS:  Please see the history of present illness.   All other ROS reviewed and negative.     Physical Exam/Data:   Vitals:   01/23/24  1200 01/23/24 1215 01/23/24 1242 01/23/24 1322  BP: (!) 150/70     Pulse: 81 83    Resp: (!) 26 (!) 23    Temp:   97.8 F (36.6 C)   TempSrc:   Oral   SpO2: 97% 95%    Weight:    56.7 kg  Height:    5\' 3"  (1.6 m)   No intake or output data in the 24 hours ending 01/23/24 1440    01/23/2024    1:22 PM 12/10/2022    5:39 PM 04/19/2015   10:37 AM  Last 3 Weights  Weight (lbs) 125  lb 136 lb 138 lb  Weight (kg) 56.7 kg 61.689 kg 62.596 kg     Body mass index is 22.14 kg/m.  General:  Well nourished, well developed, in no acute distress HEENT: normal Neck: no JVD Vascular: No carotid bruits; Distal pulses 2+ bilaterally Cardiac:  normal S1, S2; RRR; no murmur  Lungs:  clear to auscultation bilaterally, no wheezing, rhonchi or rales  Abd: soft, nontender, no hepatomegaly  Ext: no edema Musculoskeletal:  No deformities, BUE and BLE strength normal and equal Skin: warm and dry  Neuro:  CNs 2-12 intact, no focal abnormalities noted Psych:  Normal affect   EKG:  The EKG was personally reviewed and demonstrates: Normal sinus rhythm, T wave inversion in the inferolateral leads. Telemetry:  Telemetry was personally reviewed and demonstrates: Normal sinus rhythm, no significant ventricular ectopy  Relevant CV Studies:   Laboratory Data:  High Sensitivity Troponin:   Recent Labs  Lab 01/23/24 0920 01/23/24 1148  TROPONINIHS 229* 2,503*     Chemistry Recent Labs  Lab 01/23/24 0920  NA 135  K 3.7  CL 100  CO2 24  GLUCOSE 161*  BUN 20  CREATININE 0.87  CALCIUM 9.8  GFRNONAA >60  ANIONGAP 11    No results for input(s): "PROT", "ALBUMIN", "AST", "ALT", "ALKPHOS", "BILITOT" in the last 168 hours. Lipids No results for input(s): "CHOL", "TRIG", "HDL", "LABVLDL", "LDLCALC", "CHOLHDL" in the last 168 hours.  Hematology Recent Labs  Lab 01/23/24 0920  WBC 12.6*  RBC 4.46  HGB 14.2  HCT 43.7  MCV 98.0  MCH 31.8  MCHC 32.5  RDW 13.2  PLT 301   Thyroid   Recent Labs   Lab 01/23/24 0920  TSH 0.863  FREET4 1.00    BNPNo results for input(s): "BNP", "PROBNP" in the last 168 hours.  DDimer No results for input(s): "DDIMER" in the last 168 hours.   Radiology/Studies:  DG Chest Port 1 View Result Date: 01/23/2024 CLINICAL DATA:  Chest pain. EXAM: PORTABLE CHEST 1 VIEW COMPARISON:  12/27/2013. FINDINGS: The heart size and mediastinal contours are within normal limits. Aortic atherosclerosis. Similar eventration of the right hemidiaphragm. Mild bibasilar atelectasis. No focal consolidation, sizeable pleural effusion, or pneumothorax. Remote healed right-sided rib fractures. No acute osseous abnormality. IMPRESSION: Mild bibasilar atelectasis. Otherwise, no acute cardiopulmonary findings. Electronically Signed   By: Mannie Seek M.D.   On: 01/23/2024 10:08     Assessment and Plan:   NSTEMI - Serial troponin 229--2503.  Symptom lasted from 3:30 AM until before 9 AM.  She is currently chest pain-free.  Continue IV heparin.  Obtain echocardiogram. - ASA, BB and high dose statin. FLP in AM - Plan for cardiac catheterization tomorrow.  Anxiety/depression   Risk Assessment/Risk Scores:     TIMI Risk Score for Unstable Angina or Non-ST Elevation MI:   The patient's TIMI risk score is 4, which indicates a 20% risk of all cause mortality, new or recurrent myocardial infarction or need for urgent revascularization in the next 14 days.    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 ED Pain free since arrival  Troponin peak so far  was 2503  She is currently comfortable but anxious   Denies CP  Breathing is good  Neck  JVP is normal Lungs are CTA Cardiac RRR  No S3  No murmurs Abd is supple  No masses Ext are without edema  2+ DP pulses  NSTEMI   Pt with sudden onset CP early this AM   Currently pain free  WOuld continue heparin and ecASA.   Add metoprolol and statin   WOud plan L heart cath tomorrow    Continue Xanax as needed for anxiety   Pt takes at home as needed  Ola Berger MD

## 2024-01-24 ENCOUNTER — Encounter (HOSPITAL_COMMUNITY)
Admission: EM | Disposition: A | Discharge status: Skilled Nursing Facility | Payer: Self-pay | Source: Home / Self Care | Attending: Thoracic Surgery (Cardiothoracic Vascular Surgery)

## 2024-01-24 ENCOUNTER — Inpatient Hospital Stay (HOSPITAL_COMMUNITY)

## 2024-01-24 DIAGNOSIS — I25110 Atherosclerotic heart disease of native coronary artery with unstable angina pectoris: Secondary | ICD-10-CM

## 2024-01-24 DIAGNOSIS — R079 Chest pain, unspecified: Secondary | ICD-10-CM | POA: Diagnosis not present

## 2024-01-24 DIAGNOSIS — I214 Non-ST elevation (NSTEMI) myocardial infarction: Secondary | ICD-10-CM | POA: Diagnosis not present

## 2024-01-24 DIAGNOSIS — I2510 Atherosclerotic heart disease of native coronary artery without angina pectoris: Secondary | ICD-10-CM | POA: Diagnosis not present

## 2024-01-24 LAB — COMPREHENSIVE METABOLIC PANEL WITH GFR
ALT: 33 U/L (ref 0–44)
AST: 99 U/L — ABNORMAL HIGH (ref 15–41)
Albumin: 3.7 g/dL (ref 3.5–5.0)
Alkaline Phosphatase: 66 U/L (ref 38–126)
Anion gap: 10 (ref 5–15)
BUN: 18 mg/dL (ref 8–23)
CO2: 25 mmol/L (ref 22–32)
Calcium: 9.2 mg/dL (ref 8.9–10.3)
Chloride: 102 mmol/L (ref 98–111)
Creatinine, Ser: 0.8 mg/dL (ref 0.44–1.00)
GFR, Estimated: >60 mL/min (ref >60)
Glucose, Bld: 123 mg/dL — ABNORMAL HIGH (ref 70–99)
Potassium: 4 mmol/L (ref 3.5–5.1)
Sodium: 137 mmol/L (ref 135–145)
Total Bilirubin: 1.4 mg/dL — ABNORMAL HIGH (ref 0.0–1.2)
Total Protein: 7 g/dL (ref 6.5–8.1)

## 2024-01-24 LAB — CBC
HCT: 43.5 % (ref 36.0–46.0)
Hemoglobin: 14.3 g/dL (ref 12.0–15.0)
MCH: 31.6 pg (ref 26.0–34.0)
MCHC: 32.9 g/dL (ref 30.0–36.0)
MCV: 96.2 fL (ref 80.0–100.0)
Platelets: 315 10*3/uL (ref 150–400)
RBC: 4.52 MIL/uL (ref 3.87–5.11)
RDW: 13.3 % (ref 11.5–15.5)
WBC: 10.2 10*3/uL (ref 4.0–10.5)
nRBC: 0 % (ref 0.0–0.2)

## 2024-01-24 LAB — HEMOGLOBIN A1C
Hgb A1c MFr Bld: 5.6 % (ref 4.8–5.6)
Mean Plasma Glucose: 114.02 mg/dL

## 2024-01-24 LAB — LIPID PANEL
Cholesterol: 237 mg/dL — ABNORMAL HIGH (ref 0–200)
HDL: 62 mg/dL (ref >40)
LDL Cholesterol: 157 mg/dL — ABNORMAL HIGH (ref 0–99)
Total CHOL/HDL Ratio: 3.8 ratio
Triglycerides: 92 mg/dL (ref <150)
VLDL: 18 mg/dL (ref 0–40)

## 2024-01-24 LAB — ECHOCARDIOGRAM COMPLETE
Area-P 1/2: 5.13 cm2
Height: 63 in
S' Lateral: 2.7 cm
Weight: 2012.8 [oz_av]

## 2024-01-24 LAB — TROPONIN I (HIGH SENSITIVITY): Troponin I (High Sensitivity): 6291 ng/L (ref <18)

## 2024-01-24 MED ORDER — VERAPAMIL HCL 2.5 MG/ML IV SOLN
INTRAVENOUS | Status: DC | PRN
  Administered 2024-01-24: 10 mL via INTRA_ARTERIAL

## 2024-01-24 MED ORDER — SODIUM CHLORIDE 0.9 % WEIGHT BASED INFUSION: 1.0000 mL/kg/h | INTRAVENOUS | Status: DC

## 2024-01-24 MED ORDER — SODIUM CHLORIDE 0.9 % WEIGHT BASED INFUSION: 3.0000 mL/kg/h | INTRAVENOUS | Status: DC

## 2024-01-24 MED ORDER — SODIUM CHLORIDE 0.9 % WEIGHT BASED INFUSION
3.0000 mL/kg/h | INTRAVENOUS | Status: DC
  Administered 2024-01-24: 3 mL/kg/h via INTRAVENOUS

## 2024-01-24 MED ORDER — HEPARIN (PORCINE) 25000 UT/250ML-% IV SOLN
800.0000 [IU]/h | INTRAVENOUS | Status: DC
  Administered 2024-01-24: 700 [IU]/h via INTRAVENOUS
  Administered 2024-01-26: 800 [IU]/h via INTRAVENOUS
  Filled 2024-01-24 (×2): qty 250

## 2024-01-24 MED ORDER — HEPARIN SODIUM (PORCINE) 1000 UNIT/ML IJ SOLN
INTRAMUSCULAR | Status: DC | PRN
  Administered 2024-01-24: 6000 [IU] via INTRAVENOUS

## 2024-01-24 MED ORDER — IOHEXOL 350 MG/ML SOLN
INTRAVENOUS | Status: DC | PRN
  Administered 2024-01-24: 30 mL

## 2024-01-24 MED ORDER — ASPIRIN 81 MG PO CHEW: 81.0000 mg | CHEWABLE_TABLET | ORAL | Status: DC

## 2024-01-24 MED ORDER — ASPIRIN 81 MG PO CHEW
81.0000 mg | CHEWABLE_TABLET | ORAL | Status: AC
  Administered 2024-01-24: 81 mg via ORAL
  Filled 2024-01-24: qty 1

## 2024-01-24 MED ORDER — HEPARIN (PORCINE) IN NACL 2000-0.9 UNIT/L-% IV SOLN
INTRAVENOUS | Status: DC | PRN
  Administered 2024-01-24: 1000 mL

## 2024-01-24 MED ORDER — MIDAZOLAM HCL 2 MG/2ML IJ SOLN
INTRAMUSCULAR | Status: DC | PRN
  Administered 2024-01-24 (×2): 1 mg via INTRAVENOUS

## 2024-01-24 MED ORDER — HEPARIN SODIUM (PORCINE) 1000 UNIT/ML IJ SOLN
INTRAMUSCULAR | Status: AC
  Filled 2024-01-24: qty 10

## 2024-01-24 MED ORDER — NAPHAZOLINE-GLYCERIN 0.012-0.25 % OP SOLN
1.0000 [drp] | Freq: Four times a day (QID) | OPHTHALMIC | Status: DC | PRN
  Administered 2024-02-04: 2 [drp] via OPHTHALMIC
  Administered 2024-02-13: 1 [drp] via OPHTHALMIC
  Filled 2024-01-24 (×2): qty 15

## 2024-01-24 MED ORDER — FENTANYL CITRATE (PF) 100 MCG/2ML IJ SOLN
INTRAMUSCULAR | Status: AC
  Filled 2024-01-24: qty 2

## 2024-01-24 MED ORDER — LIDOCAINE HCL (PF) 1 % IJ SOLN
INTRAMUSCULAR | Status: DC | PRN
  Administered 2024-01-24: 2 mL

## 2024-01-24 MED ORDER — SODIUM CHLORIDE 0.9 % WEIGHT BASED INFUSION
1.0000 mL/kg/h | INTRAVENOUS | Status: DC
  Administered 2024-01-24: 1 mL/kg/h via INTRAVENOUS

## 2024-01-24 MED ORDER — MIDAZOLAM HCL 2 MG/2ML IJ SOLN
INTRAMUSCULAR | Status: AC
  Filled 2024-01-24: qty 2

## 2024-01-24 MED ORDER — ASPIRIN 81 MG PO TBEC
81.0000 mg | DELAYED_RELEASE_TABLET | Freq: Every day | ORAL | Status: DC
  Administered 2024-01-25 – 2024-01-26 (×2): 81 mg via ORAL
  Filled 2024-01-24 (×2): qty 1

## 2024-01-24 MED ORDER — LIDOCAINE HCL (PF) 1 % IJ SOLN
INTRAMUSCULAR | Status: AC
  Filled 2024-01-24: qty 30

## 2024-01-24 MED ORDER — VERAPAMIL HCL 2.5 MG/ML IV SOLN
INTRAVENOUS | Status: AC
  Filled 2024-01-24: qty 2

## 2024-01-24 MED ORDER — FENTANYL CITRATE (PF) 100 MCG/2ML IJ SOLN
INTRAMUSCULAR | Status: DC | PRN
  Administered 2024-01-24 (×2): 25 ug via INTRAVENOUS

## 2024-01-24 NOTE — Progress Notes (Signed)
 PROGRESS NOTE    Kaitlyn Wright  ONG:[id number] DOB: [birth year] DOA: 01/23/2024 PCP: Roselind Congo, MD    Brief Narrative:   Kaitlyn Wright is a 78 y.o. female with past medical history significant for hypothyroidism, anxiety/depression who presented to [facility] ED on 01/23/2024 from home with complaints of chest pain.  Localized to the midsternal region, without radiation.   Patient reports that at about a week ago, she woke up in the morning with mild substernal chest discomfort and after she took aspirin 325 mg and 1 mg of alprazolam, it resolved. Last night, she had a big meal around 8:30 PM and took her up alprazolam before bed. She woke up around 3:30 AM with severe substernal chest pressure. She took 1 g of Tylenol , 1 mg of alprazolam and a 325 mg aspirin without significant relief so she presented to the ED for further evaluation. She endorse severe anxiety requiring her to take her alprazolam 1-2 times a day and nightly to sleep. Her chest pain has resolved and she denies any nausea, vomiting, sweats, dizziness, headaches, vision changes, shortness of breath, abdominal pain or palpitations. She does not smoke, drink alcohol or do any illicit drugs. She reports a family history of heart attack in her mother but reports she drank alcohol excessively.   In the ED, temp 98.2, RR 18, HR 64, BP 176/79, SpO2 98% on room air. Labs were significant for troponin to 229->2503, normal kidney function, WBC 12.6, Hgb 14.2, glucose 161, TSH 0.86, free T4 1.00.  EKG shows sinus rhythm with T wave inversions in the inferior lateral leads. CXR shows mild bibasilar atelectasis but no acute cardiopulmonary disease.  Cardiology was consulted for evaluation.  Patient was given alprazolam 1 mg x 1 and started on heparin drip.  TRH was consulted for admission.  Assessment & Plan:   NSTEMI Patient presenting with mid/substernal chest pressure awaking from sleep.  Initial troponin elevated [id number].  EKG showing T wave  inversions in the inferior lateral leads.  Patient was started on heparin drip and cardiology consulted.  Underwent left heart catheterization on 01/24/2024 with findings of multivessel coronary artery disease with recommendation of cardiothoracic referral for consideration of CABG. -- Cardiology following, appreciate assistance -- Cardiothoracic surgery consulted for consideration of CABG -- Echocardiogram: Pending -- Continue heparin drip, pharmacy consulted for dosing/monitoring -- Metoprolol tartrate 25 mg p.o. twice daily -- Aspirin 81 mg p.o. daily -- Atorvastatin 80 mg p.o. daily  Anxiety and depression -- Alprazolam 1 mg p.o. daily as needed anxiety, 1 mg scheduled nightly -- Hydroxyzine 10 mg p.o. nightly    Hypothyroidism TSH 0.863 and free T41.00, within normal limits. -- Continue levothyroxine 88 mcg p.o. daily   DVT prophylaxis: Heparin drip    Code Status: Full Code Family Communication: No family present at bedside  Disposition Plan:  Level of care: Telemetry Cardiac Status is: Inpatient Remains inpatient appropriate because: Pending CTS evaluation for multivessel CAD with consideration of CABG    Consultants:  Cardiology Cardiothoracic surgery  Procedures:  Left heart catheterization, Dr. Berry Bristol 5/6  Antimicrobials:  None   Subjective: Patient seen examined bedside, lying in bed.  [facility] present preparing [facility] transfer to [facility] for left heart catheterization.  Currently chest pain-free.  Complaining of some stomach "discomfort" after taking multiple pills on a "empty stomach".  No other specific complaints, questions or concerns at this time.  Denies headache, no dizziness, no current chest pain, no palpitations, no shortness of breath, no fever/chills/night  sweats, no nausea/vomiting/diarrhea, no focal weakness, no fatigue, no paresthesia.  No acute events overnight per nursing.  Objective: Vitals:   01/24/24 0500 01/24/24 0600  01/24/24 0730 01/24/24 0736  BP: 137/66 135/65 (!) 144/69   Pulse: 65 71 82   Resp: 20 20    Temp:  98.1 F (36.7 C)  98.4 F (36.9 C)  TempSrc:    Oral  SpO2: 93% 92% 94%   Weight:      Height:        Intake/Output Summary (Last 24 hours) at 01/24/2024 0831 Last data filed at 01/24/2024 0444 Gross per 24 hour  Intake 59.54 ml  Output --  Net 59.54 ml   Filed Weights   01/23/24 1322  Weight: 56.7 kg    Examination:  Physical Exam: GEN: NAD, alert and oriented x 3, wd/wn HEENT: NCAT, PERRL, EOMI, sclera clear, MMM PULM: CTAB w/o wheezes/crackles, normal respiratory effort, on room air CV: RRR w/o M/G/R GI: abd soft, NTND, NABS, no R/G/M MSK: no peripheral edema, muscle strength globally intact 5/5 bilateral upper/lower extremities NEURO: CN II-XII intact, no focal deficits, sensation to light touch intact PSYCH: normal mood/affect Integumentary: dry/intact, no rashes or wounds    Data Reviewed: I have personally reviewed following labs and imaging studies  CBC: Recent Labs  Lab 01/23/24 0920 01/24/24 0416  WBC 12.6* 10.2  HGB 14.2 14.3  HCT 43.7 43.5  MCV 98.0 96.2  PLT 301 315   Basic Metabolic Panel: Recent Labs  Lab 01/23/24 0920 01/24/24 0416  NA 135 137  K 3.7 4.0  CL 100 102  CO2 24 25  GLUCOSE 161* 123*  BUN 20 18  CREATININE 0.87 0.80  CALCIUM 9.8 9.2   GFR: Estimated Creatinine Clearance: 46.4 mL/min (by C-G formula based on SCr of 0.8 mg/dL). Liver Function Tests: Recent Labs  Lab 01/24/24 0416  AST 99*  ALT 33  ALKPHOS 66  BILITOT 1.4*  PROT 7.0  ALBUMIN 3.7   No results for input(s): "LIPASE", "AMYLASE" in the last 168 hours. No results for input(s): "AMMONIA" in the last 168 hours. Coagulation Profile: No results for input(s): "INR", "PROTIME" in the last 168 hours. Cardiac Enzymes: No results for input(s): "CKTOTAL", "CKMB", "CKMBINDEX", "TROPONINI" in the last 168 hours. BNP (last 3 results) No results for input(s):  "PROBNP" in the last 8760 hours. HbA1C: No results for input(s): "HGBA1C" in the last 72 hours. CBG: No results for input(s): "GLUCAP" in the last 168 hours. Lipid Profile: Recent Labs    01/24/24 0416  CHOL 237*  HDL 62  LDLCALC 157*  TRIG 92  CHOLHDL 3.8   Thyroid  Function Tests: Recent Labs    01/23/24 0920  TSH 0.863  FREET4 1.00   Anemia Panel: No results for input(s): "VITAMINB12", "FOLATE", "FERRITIN", "TIBC", "IRON", "RETICCTPCT" in the last 72 hours. Sepsis Labs: No results for input(s): "PROCALCITON", "LATICACIDVEN" in the last 168 hours.  No results found for this or any previous visit (from the past 240 hours).       Radiology Studies: DG Chest Port 1 View Result Date: 01/23/2024 CLINICAL DATA:  Chest pain. EXAM: PORTABLE CHEST 1 VIEW COMPARISON:  12/27/2013. FINDINGS: The heart size and mediastinal contours are within normal limits. Aortic atherosclerosis. Similar eventration of the right hemidiaphragm. Mild bibasilar atelectasis. No focal consolidation, sizeable pleural effusion, or pneumothorax. Remote healed right-sided rib fractures. No acute osseous abnormality. IMPRESSION: Mild bibasilar atelectasis. Otherwise, no acute cardiopulmonary findings. Electronically Signed   By: Fredda Jacobus  Lateef M.D.   On: 01/23/2024 10:08        Scheduled Meds:  [MAR Hold] ALPRAZolam  1 mg Oral QHS   [MAR Hold] aspirin EC  81 mg Oral Daily   [MAR Hold] atorvastatin  80 mg Oral q1800   [MAR Hold] hydrOXYzine  10 mg Oral QHS   [MAR Hold] latanoprost  1 drop Both Eyes QHS   [MAR Hold] levothyroxine  88 mcg Oral Q0600   [MAR Hold] metoprolol tartrate  25 mg Oral BID   Continuous Infusions:  sodium chloride 1 mL/kg/hr (01/24/24 0444)   heparin 700 Units/hr (01/24/24 0022)     LOS: 1 day    Time spent: 52 minutes spent on 01/24/2024 caring for this patient face-to-face including chart review, ordering labs/tests, documenting, discussion with nursing staff, consultants,  updating family and interview/physical exam    Rema Care [place], DO [facility] Available via Epic secure chat 7am-7pm After these hours, please refer to coverage provider listed on [email or web address] 01/24/2024, 8:31 AM

## 2024-01-24 NOTE — ED Notes (Addendum)
 I spoke to patient about letting us  know about any OTC meds she was taking. She let me know she has dry eyes and put SYSTANE eye drops. Informed patient to let us  know and we can let doctor know, that way we are monitoring what she is taking and that the medications don't interfere with the procedure.  6:35a- at this time Pt tell's me " I must tell you then. I was given xanax last night and it did not help me sleep, I was given (2) 0.5 mg xanax pills. I so happen to have a blue 1mg  xanax in my pocket and I took it. So now you know." I told her again to please let us  know of what she needs, we will ask the doctor for any medications, that way it doesn't interfere with her procedure.

## 2024-01-24 NOTE — ED Notes (Signed)
 Pt placed on hospital bed
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
## 2024-01-24 NOTE — Plan of Care (Signed)
  Problem: Education: Goal: Understanding of CV disease, CV risk reduction, and recovery process will improve Outcome: Progressing Goal: Individualized Educational Video(s) Outcome: Progressing   Problem: Activity: Goal: Ability to return to baseline activity level will improve Outcome: Progressing   Problem: Cardiovascular: Goal: Ability to achieve and maintain adequate cardiovascular perfusion will improve Outcome: Progressing Goal: Vascular access site(s) Level 0-1 will be maintained Outcome: Progressing   Problem: Health Behavior/Discharge Planning: Goal: Ability to safely manage health-related needs after discharge will improve Outcome: Progressing   Problem: Education: Goal: Knowledge of General Education information will improve Description: Including pain rating scale, medication(s)/side effects and non-pharmacologic comfort measures Outcome: Progressing   Problem: Health Behavior/Discharge Planning: Goal: Ability to manage health-related needs will improve Outcome: Progressing   Problem: Clinical Measurements: Goal: Ability to maintain clinical measurements within normal limits will improve Outcome: Progressing Goal: Will remain free from infection Outcome: Progressing Goal: Diagnostic test results will improve Outcome: Progressing Goal: Respiratory complications will improve Outcome: Progressing Goal: Cardiovascular complication will be avoided Outcome: Progressing   Problem: Activity: Goal: Risk for activity intolerance will decrease Outcome: Progressing   Problem: Coping: Goal: Level of anxiety will decrease Outcome: Progressing   Problem: Elimination: Goal: Will not experience complications related to bowel motility Outcome: Progressing Goal: Will not experience complications related to urinary retention Outcome: Progressing

## 2024-01-24 NOTE — ED Notes (Signed)
 [facility] called calling on status of patient- stated that they will pick her up around 7:30-8:00a.

## 2024-01-24 NOTE — ED Notes (Signed)
 Patient ambulated with assistance to the bathroom.

## 2024-01-24 NOTE — Progress Notes (Signed)
 PHARMACY - ANTICOAGULATION CONSULT NOTE  Pharmacy Consult for Heparin Indication: chest pain/ACS  Allergies  Allergen Reactions   Naprosyn  [Naproxen ]     Upset stomach    Latex Rash    Patient Measurements: Height: 5\' 3"  (160 cm) Weight: 57.1 kg (125 lb 12.8 oz) IBW/kg (Calculated) : 52.4 HEPARIN DW (KG): 57.1  Vital Signs: Temp: 98.4 F (36.9 C) (05/06 0736) Temp Source: Oral (05/06 0736) BP: 131/97 (05/06 1245) Pulse Rate: 76 (05/06 1300)  Labs: Recent Labs    01/23/24 0920 01/23/24 1148 01/23/24 2330 01/24/24 0416  HGB 14.2  --   --  14.3  HCT 43.7  --   --  43.5  PLT 301  --   --  315  HEPARINUNFRC  --   --  0.36  --   CREATININE 0.87  --   --  0.80  TROPONINIHS 229* 2,503*  --  6,291*    Estimated Creatinine Clearance: 46.4 mL/min (by C-G formula based on SCr of 0.8 mg/dL).   Medical History: Past Medical History:  Diagnosis Date   Anxiety    Depression    Hypothyroidism     Assessment: 78 yo F admit with chest pain/NSTEMI.  Not on anticoagulation PTA.   Baseline CBC WNL.Troponins 229>2503.  Heparin level therapeutic earlier on 700 units/h, to resume post/cath 6h after TR band removal (~1515).  Goal of Therapy:  Heparin level 0.3-0.7 units/ml Monitor platelets by anticoagulation protocol: Yes   Plan:  Resume heparin 700 units/h no bolus at 2115 Check 8h heparin level after restart  Seydina Holliman, PharmD, BCPS, [facility] Clinical Pharmacist [phone] Please check AMION for all [facility] Pharmacy numbers 01/24/2024

## 2024-01-24 NOTE — ED Notes (Signed)
 Pt back in room, NT let me know that patient was putting in eye drops.

## 2024-01-24 NOTE — ED Notes (Signed)
 Report given to [facility], pt to [facility]

## 2024-01-24 NOTE — Interval H&P Note (Signed)
 History and Physical Interval Note:  01/24/2024 9:04 AM  Kaitlyn Wright  has presented today for surgery, with the diagnosis of non-stemi.  The various methods of treatment have been discussed with the patient and family. After consideration of risks, benefits and other options for treatment, the patient has consented to  Procedure(s): LEFT HEART CATH AND CORONARY ANGIOGRAPHY (N/A) and coronary angioplasty as a surgical intervention for NSTEMI.  The patient's history has been reviewed, patient examined, no change in status, stable for surgery.  I have reviewed the patient's chart and labs.  Questions were answered to the patient's satisfaction.     [name]

## 2024-01-24 NOTE — Progress Notes (Signed)
 PHARMACY - ANTICOAGULATION CONSULT NOTE  Pharmacy Consult for Heparin Indication: chest pain/ACS  Allergies  Allergen Reactions   Naprosyn  [Naproxen ]     Upset stomach    Latex Rash    Patient Measurements: Height: 5\' 3"  (160 cm) Weight: 56.7 kg (125 lb) IBW/kg (Calculated) : 52.4 HEPARIN DW (KG): 56.7  Vital Signs: Temp: 98.5 F (36.9 C) (05/05 2030) Temp Source: Oral (05/05 1242) BP: 147/65 (05/05 2130) Pulse Rate: 84 (05/05 2130)  Labs: Recent Labs    01/23/24 0920 01/23/24 1148 01/23/24 2330  HGB 14.2  --   --   HCT 43.7  --   --   PLT 301  --   --   HEPARINUNFRC  --   --  0.36  CREATININE 0.87  --   --   TROPONINIHS 229* 2,503*  --     Estimated Creatinine Clearance: 42.7 mL/min (by C-G formula based on SCr of 0.87 mg/dL).   Medical History: Past Medical History:  Diagnosis Date   Anxiety    Depression    Hypothyroidism     Assessment: 78 yo F admit with chest pain/NSTEMI.  Not on anticoagulation PTA.   Baseline CBC WNL.Troponins 229>2503.  01/24/2024: Initial heparin level 0.36- therapeutic on IV heparin 700 units/hr CBC WNL No bleeding or infusion related concerns reported by RN  Goal of Therapy:  Heparin level 0.3-0.7 units/ml Monitor platelets by anticoagulation protocol: Yes   Plan:  Continue heparin infusion at 700 units/hr Check confirmatory heparin level tomorrow if plans re: cath change Daily HL and CBC while on heparin Noted plans for cardiac cath at noon   [facility] PharmD 01/24/2024,12:12 AM
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
## 2024-01-25 ENCOUNTER — Encounter (HOSPITAL_COMMUNITY): Payer: Self-pay | Admitting: Cardiology

## 2024-01-25 DIAGNOSIS — F32A Depression, unspecified: Secondary | ICD-10-CM | POA: Diagnosis not present

## 2024-01-25 DIAGNOSIS — E039 Hypothyroidism, unspecified: Secondary | ICD-10-CM | POA: Diagnosis not present

## 2024-01-25 DIAGNOSIS — F419 Anxiety disorder, unspecified: Secondary | ICD-10-CM | POA: Diagnosis present

## 2024-01-25 DIAGNOSIS — I214 Non-ST elevation (NSTEMI) myocardial infarction: Secondary | ICD-10-CM | POA: Diagnosis not present

## 2024-01-25 LAB — CBC
HCT: 42.1 % (ref 36.0–46.0)
Hemoglobin: 13.7 g/dL (ref 12.0–15.0)
MCH: 31.1 pg (ref 26.0–34.0)
MCHC: 32.5 g/dL (ref 30.0–36.0)
MCV: 95.5 fL (ref 80.0–100.0)
Platelets: 277 10*3/uL (ref 150–400)
RBC: 4.41 MIL/uL (ref 3.87–5.11)
RDW: 13.5 % (ref 11.5–15.5)
WBC: 11.6 10*3/uL — ABNORMAL HIGH (ref 4.0–10.5)
nRBC: 0 % (ref 0.0–0.2)

## 2024-01-25 LAB — HEPARIN LEVEL (UNFRACTIONATED)
Heparin Unfractionated: 0.26 [IU]/mL — ABNORMAL LOW (ref 0.30–0.70)
Heparin Unfractionated: 0.52 [IU]/mL (ref 0.30–0.70)

## 2024-01-25 LAB — BASIC METABOLIC PANEL WITH GFR
Anion gap: 12 (ref 5–15)
BUN: 23 mg/dL (ref 8–23)
CO2: 20 mmol/L — ABNORMAL LOW (ref 22–32)
Calcium: 9 mg/dL (ref 8.9–10.3)
Chloride: 105 mmol/L (ref 98–111)
Creatinine, Ser: 0.95 mg/dL (ref 0.44–1.00)
GFR, Estimated: >60 mL/min (ref >60)
Glucose, Bld: 101 mg/dL — ABNORMAL HIGH (ref 70–99)
Potassium: 4 mmol/L (ref 3.5–5.1)
Sodium: 137 mmol/L (ref 135–145)

## 2024-01-25 LAB — MAGNESIUM: Magnesium: 2.1 mg/dL (ref 1.7–2.4)

## 2024-01-25 LAB — LIPOPROTEIN A (LPA): Lipoprotein (a): 24 nmol/L (ref <75.0)

## 2024-01-25 MED ORDER — OMEGA-3-ACID ETHYL ESTERS 1 G PO CAPS
1.0000 g | ORAL_CAPSULE | Freq: Two times a day (BID) | ORAL | Status: DC
  Administered 2024-01-25 – 2024-01-26 (×3): 1 g via ORAL
  Filled 2024-01-25 (×3): qty 1

## 2024-01-25 MED ORDER — PANTOPRAZOLE SODIUM 40 MG PO TBEC
40.0000 mg | DELAYED_RELEASE_TABLET | Freq: Every day | ORAL | Status: DC
  Administered 2024-01-25 – 2024-01-26 (×2): 40 mg via ORAL
  Filled 2024-01-25 (×2): qty 1

## 2024-01-25 MED ORDER — ALPRAZOLAM 0.5 MG PO TABS
0.5000 mg | ORAL_TABLET | Freq: Every day | ORAL | Status: DC | PRN
  Administered 2024-01-26: 0.5 mg via ORAL
  Filled 2024-01-25: qty 1

## 2024-01-25 MED ORDER — VITAMIN D 25 MCG (1000 UNIT) PO TABS
1000.0000 [IU] | ORAL_TABLET | Freq: Every day | ORAL | Status: DC
  Administered 2024-01-25 – 2024-01-26 (×2): 1000 [IU] via ORAL
  Filled 2024-01-25 (×2): qty 1

## 2024-01-25 MED ORDER — HYDROXYZINE HCL 25 MG PO TABS
25.0000 mg | ORAL_TABLET | Freq: Three times a day (TID) | ORAL | Status: DC | PRN
  Administered 2024-01-25 – 2024-01-26 (×2): 25 mg via ORAL
  Filled 2024-01-25 (×2): qty 1

## 2024-01-25 NOTE — Progress Notes (Signed)
 PHARMACY - ANTICOAGULATION CONSULT NOTE  Pharmacy Consult for Heparin Indication: chest pain/ACS  Allergies  Allergen Reactions   Naprosyn  [Naproxen ]     Upset stomach    Latex Rash    Patient Measurements: Height: 5\' 3"  (160 cm) Weight: 58.7 kg (129 lb 6.6 oz) IBW/kg (Calculated) : 52.4 HEPARIN DW (KG): 57.1  Vital Signs: Temp: 98.1 F (36.7 C) (05/07 1614) Temp Source: Oral (05/07 1614) BP: 147/89 (05/07 1614) Pulse Rate: 76 (05/07 1614)  Labs: Recent Labs    01/23/24 0920 01/23/24 1148 01/23/24 2330 01/24/24 0416 01/25/24 0700 01/25/24 2037  HGB 14.2  --   --  14.3 13.7  --   HCT 43.7  --   --  43.5 42.1  --   PLT 301  --   --  315 277  --   HEPARINUNFRC  --   --  0.36  --  0.26* 0.52  CREATININE 0.87  --   --  0.80 0.95  --   TROPONINIHS 229* 2,503*  --  6,291*  --   --     Estimated Creatinine Clearance: 39.1 mL/min (by C-G formula based on SCr of 0.95 mg/dL).   Medical History: Past Medical History:  Diagnosis Date   Anxiety    Depression    Hypothyroidism     Assessment: 78 yo F admit with chest pain/NSTEMI.  Not on anticoagulation PTA.   Baseline CBC WNL.Troponins 229>2503.  Heparin level therapeutic this evening.  Goal of Therapy:  Heparin level 0.3-0.7 units/ml Monitor platelets by anticoagulation protocol: Yes   Plan:  Continue heparin 800 units/h Daily heparin level and CBC  Levin Reamer, PharmD, [place], [facility] Clinical Pharmacist [phone] Please check AMION for all [facility] Pharmacy numbers 01/25/2024

## 2024-01-25 NOTE — Plan of Care (Signed)

## 2024-01-25 NOTE — Progress Notes (Signed)
 PROGRESS NOTE    Kaitlyn Wright  ONG:[id number] DOB: Oct 14, 1945 DOA: 01/23/2024 PCP: Roselind Congo, MD    Chief Complaint  Patient presents with   Chest Pain    Brief Narrative:  Kaitlyn Wright is a 78 y.o. female with past medical history significant for hypothyroidism, anxiety/depression who presented to [facility] ED on 01/23/2024 from home with complaints of chest pain.  Localized to the midsternal region, without radiation.   Patient reports that at about a week ago, she woke up in the morning with mild substernal chest discomfort and after she took aspirin 325 mg and 1 mg of alprazolam, it resolved. Last night, she had a big meal around 8:30 PM and took her up alprazolam before bed. She woke up around 3:30 AM with severe substernal chest pressure. She took 1 g of Tylenol , 1 mg of alprazolam and a 325 mg aspirin without significant relief so she presented to the ED for further evaluation. She endorse severe anxiety requiring her to take her alprazolam 1-2 times a day and nightly to sleep. Her chest pain has resolved and she denies any nausea, vomiting, sweats, dizziness, headaches, vision changes, shortness of breath, abdominal pain or palpitations. She does not smoke, drink alcohol or do any illicit drugs. She reports a family history of heart attack in her mother but reports she drank alcohol excessively.   In the ED, temp 98.2, RR 18, HR 64, BP 176/79, SpO2 98% on room air. Labs were significant for troponin to 229->2503, normal kidney function, WBC 12.6, Hgb 14.2, glucose 161, TSH 0.86, free T4 1.00.  EKG shows sinus rhythm with T wave inversions in the inferior lateral leads. CXR shows mild bibasilar atelectasis but no acute cardiopulmonary disease.  Cardiology was consulted for evaluation.  Patient was given alprazolam 1 mg x 1 and started on heparin drip.  TRH was consulted for admission.  Patient seen by cardiology underwent cardiac cath noted to have multiple vessel disease and referral made to CT  surgery for probable CABG.     Assessment & Plan:   Principal Problem:   NSTEMI (non-ST elevated myocardial infarction) (HCC) Active Problems:   Elevated troponin   Anxiety and depression   Hypothyroidism  #1 non-STEMI -Patient had presented with mid/substernal chest pressure awakening from sleep. - Troponins noted at 229>> 2503>>> 6291. - EKG done with T wave inversions in the inferior leads. - Patient placed on heparin drip.  Cardiology consulted. - Patient underwent cardiac catheterization 01/24/2024 with multivessel CAD with recommendations for CT referral for probable CABG. -2D echo with EF of 60 to 65%,NWMA, grade 1 diastolic dysfunction. - CT surgery consulted. - Continue heparin drip, metoprolol 25 mg twice daily, aspirin 81 mg daily, atorvastatin 80 mg daily. - Per cardiology.  2.  Depression/anxiety -Continue alprazolam 1 mg scheduled nightly. - Decrease alprazolam to 0.5 mg daily as needed anxiety as patient states full dose alprazolam causes some sleepiness per RN. - Continue hydroxyzine 10 mg nightly. - Place on hydroxyzine 25 mg p.o. every 8 hours as needed anxiety.  3.  Hypothyroidism -TSH 0.863. - Free T4  1.00. - Continue home regimen Synthroid. - Outpatient follow-up.    DVT prophylaxis: Heparin GTT Code Status: Full Family Communication: Updated patient and daughter at bedside. Disposition: TBD  Status is: Inpatient Remains inpatient appropriate because: Severity of illness   Consultants:  Cardiology: Dr. Pearly Bound 01/23/2024 Cardiothoracic surgery: Dr. Luna Salinas 01/24/2024  Procedures:  Cardiac catheterization 01/24/2024 per Dr. Berry Bristol 2D echo 01/24/2024   Antimicrobials:  Anti-infectives (From admission, onward)    None         Subjective: Patient laying in bed.  Denies any ongoing chest pain or shortness of breath.  Somewhat anxious about CABG she needs to have and understands it is needed for her to continue to live.  Daughter at bedside.   States will need to talk to the cardiothoracic surgeon so he can explain step-by-step what he is recommending.  Objective: Vitals:   01/25/24 0800 01/25/24 0922 01/25/24 1612 01/25/24 1614  BP:  (!) 115/58 (!) 147/89 (!) 147/89  Pulse:  75  76  Resp: 20 17  19   Temp:    98.1 F (36.7 C)  TempSrc:    Oral  SpO2:    97%  Weight:      Height:        Intake/Output Summary (Last 24 hours) at 01/25/2024 1817 Last data filed at 01/25/2024 1800 Gross per 24 hour  Intake 1219.65 ml  Output 700 ml  Net 519.65 ml   Filed Weights   01/23/24 1322 01/24/24 1335 01/25/24 0501  Weight: 56.7 kg 57.1 kg 58.7 kg    Examination:  General exam: Appears calm and comfortable  Respiratory system: Clear to auscultation. Respiratory effort normal. Cardiovascular system: S1 & S2 heard, RRR. No JVD, murmurs, rubs, gallops or clicks. No pedal edema. Gastrointestinal system: Abdomen is nondistended, soft and nontender. No organomegaly or masses felt. Normal bowel sounds heard. Central nervous system: Alert and oriented. No focal neurological deficits. Extremities: Symmetric 5 x 5 power. Skin: No rashes, lesions or ulcers Psychiatry: Judgement and insight appear normal. Mood & affect appropriate.     Data Reviewed: I have personally reviewed following labs and imaging studies  CBC: Recent Labs  Lab 01/23/24 0920 01/24/24 0416 01/25/24 0700  WBC 12.6* 10.2 11.6*  HGB 14.2 14.3 13.7  HCT 43.7 43.5 42.1  MCV 98.0 96.2 95.5  PLT 301 315 277    Basic Metabolic Panel: Recent Labs  Lab 01/23/24 0920 01/24/24 0416 01/25/24 0700  NA 135 137 137  K 3.7 4.0 4.0  CL 100 102 105  CO2 24 25 20*  GLUCOSE 161* 123* 101*  BUN 20 18 23   CREATININE 0.87 0.80 0.95  CALCIUM 9.8 9.2 9.0  MG  --   --  2.1    GFR: Estimated Creatinine Clearance: 39.1 mL/min (by C-G formula based on SCr of 0.95 mg/dL).  Liver Function Tests: Recent Labs  Lab 01/24/24 0416  AST 99*  ALT 33  ALKPHOS 66  BILITOT  1.4*  PROT 7.0  ALBUMIN 3.7    CBG: No results for input(s): "GLUCAP" in the last 168 hours.   No results found for this or any previous visit (from the past 240 hours).       Radiology Studies: ECHOCARDIOGRAM COMPLETE Result Date: 01/24/2024    ECHOCARDIOGRAM REPORT   Patient Name:   YSABELA IACOBELLI Date of Exam: 01/24/2024 Medical Rec #:  [id number]   Height:       63.0 in Accession #:    [id number]  Weight:       125.8 lb Date of Birth:  1946-08-10   BSA:          1.588 m Patient Age:    78 years    BP:           131/97 mmHg Patient Gender: F           HR:  75 bpm. Exam Location:  Inpatient Procedure: 2D Echo, Cardiac Doppler and Color Doppler (Both Spectral and Color            Flow Doppler were utilized during procedure). Indications:    Chest Pain  History:        Patient has no prior history of Echocardiogram examinations.  Sonographer:    Janette Medley Referring Phys: [name] IMPRESSIONS  1. Left ventricular ejection fraction, by estimation, is 60 to 65%. The left ventricle has normal function. The left ventricle has no regional wall motion abnormalities. Left ventricular diastolic parameters are consistent with Grade I diastolic dysfunction (impaired relaxation).  2. Right ventricular systolic function is normal. The right ventricular size is normal.  3. The mitral valve is normal in structure. No evidence of mitral valve regurgitation. No evidence of mitral stenosis.  4. The aortic valve is normal in structure. Aortic valve regurgitation is not visualized. No aortic stenosis is present.  5. The inferior vena cava is normal in size with greater than 50% respiratory variability, suggesting right atrial pressure of 3 mmHg. FINDINGS  Left Ventricle: Left ventricular ejection fraction, by estimation, is 60 to 65%. The left ventricle has normal function. The left ventricle has no regional wall motion abnormalities. The left ventricular internal cavity size was normal in size. There is  no  left ventricular hypertrophy. Left ventricular diastolic parameters are consistent with Grade I diastolic dysfunction (impaired relaxation). Right Ventricle: The right ventricular size is normal. No increase in right ventricular wall thickness. Right ventricular systolic function is normal. Left Atrium: Left atrial size was normal in size. Right Atrium: Right atrial size was normal in size. Pericardium: There is no evidence of pericardial effusion. Mitral Valve: The mitral valve is normal in structure. No evidence of mitral valve regurgitation. No evidence of mitral valve stenosis. Tricuspid Valve: The tricuspid valve is normal in structure. Tricuspid valve regurgitation is not demonstrated. No evidence of tricuspid stenosis. Aortic Valve: The aortic valve is normal in structure. Aortic valve regurgitation is not visualized. No aortic stenosis is present. Pulmonic Valve: The pulmonic valve was normal in structure. Pulmonic valve regurgitation is not visualized. No evidence of pulmonic stenosis. Aorta: The aortic root is normal in size and structure. Venous: The inferior vena cava is normal in size with greater than 50% respiratory variability, suggesting right atrial pressure of 3 mmHg. IAS/Shunts: No atrial level shunt detected by color flow Doppler.  LEFT VENTRICLE PLAX 2D LVIDd:         3.80 cm   Diastology LVIDs:         2.70 cm   LV e' medial:    6.06 cm/s LV PW:         0.90 cm   LV E/e' medial:  12.4 LV IVS:        0.70 cm   LV e' lateral:   10.40 cm/s LVOT diam:     2.00 cm   LV E/e' lateral: 7.2 LV SV:         44 LV SV Index:   28 LVOT Area:     3.14 cm  RIGHT VENTRICLE             IVC RV S prime:     17.70 cm/s  IVC diam: 1.40 cm TAPSE (M-mode): 1.9 cm LEFT ATRIUM           Index        RIGHT ATRIUM  Index LA Vol (A4C): 28.7 ml 18.07 ml/m  RA Area:     5.86 cm                                    RA Volume:   8.35 ml  5.26 ml/m  AORTIC VALVE LVOT Vmax:   83.70 cm/s LVOT Vmean:  53.250 cm/s LVOT  VTI:    0.142 m  AORTA Ao Root diam: 2.20 cm Ao Asc diam:  2.80 cm MITRAL VALVE MV Area (PHT): 5.13 cm    SHUNTS MV Decel Time: 148 msec    Systemic VTI:  0.14 m MV E velocity: 74.90 cm/s  Systemic Diam: 2.00 cm MV A velocity: 83.90 cm/s MV E/A ratio:  0.89 Dorothye Gathers MD Electronically signed by Dorothye Gathers MD Signature Date/Time: 01/24/2024/5:24:14 PM    Final    CARDIAC CATHETERIZATION Result Date: 01/24/2024 Images from the original result were not included. Cardiac Catheterization 01/24/24: Hemodynamic data: LVEDP 11 mmHg.  No pressure gradient across the aortic valve. Angiographic data: Severe diffuse coronary calcification involving the proximal LAD, ramus and CX. LM: Large-caliber vessel, has mild calcification, no significant disease. LAD: Severely diseased from the proximal, mid segment, there are multiple tandem high-grade 90% followed by 60% stenosis in the proximal and mid segment followed by a focal 90% and a tandem 80% on the apical 90% stenosis.  Gives origin to large D1 with a ostial 95% stenosis. RI: Large-caliber vessel distally however there is a long segment proximal 90% stenosis. LCx: Small vessel, has about a 30% mid stenosis. RCA: Has anterior origin and is moderately calcified in the proximal and mid segment and tortuous.  There is a tandem 30 to 40% mid stenosis followed by a high-grade focal 90% stenosis at the crux.  There are 2 small PDA branches and a large PL branch. Impression and recommendations: Patient has multivessel coronary artery disease.  In spite of multiple tandem lesions in the LAD, she would probably still be benefited by CABG evaluation.  Referral made.        Scheduled Meds:  ALPRAZolam  1 mg Oral QHS   aspirin EC  81 mg Oral Daily   atorvastatin  80 mg Oral q1800   cholecalciferol  1,000 Units Oral Daily   hydrOXYzine  10 mg Oral QHS   latanoprost  1 drop Both Eyes QHS   levothyroxine  88 mcg Oral Q0600   metoprolol tartrate  25 mg Oral BID   omega-3  acid ethyl esters  1 g Oral BID   pantoprazole  40 mg Oral Daily   Continuous Infusions:  heparin 800 Units/hr (01/25/24 1800)     LOS: 2 days    Time spent: 35 minutes    Hilda Lovings, MD [facility]   To contact the attending provider between 7A-7P or the covering provider during after hours 7P-7A, please log into the web site [email or web address] and access using universal [place] password for that web site. If you do not have the password, please call the hospital operator.  01/25/2024, 6:17 PM

## 2024-01-25 NOTE — TOC CM/SW Note (Signed)
 Transition of Care [facility]) - Inpatient Brief Assessment   Patient Details  Name: Kaitlyn Wright MRN: [id number] Date of Birth: Dec 14, 1945  Transition of Care [facility]) CM/SW Contact:    Jennett Model, RN Phone Number: 01/25/2024, 2:59 PM   Clinical Narrative: Transition of Care [facility]) - Inpatient Brief Assessment   Patient Details  Name: Kaitlyn Wright MRN: [id number] Date of Birth: 1946/05/24  Transition of Care [facility]) CM/SW Contact:    Jennett Model, RN Phone Number: 01/25/2024, 2:59 PM   Clinical Narrative: From home alone, indep, has PCP and insurance on file, states has no HH services in place at this time or DME at home.  States she is not sure how she will get home at dc, she mentioned something about her daughter going out of town, so she may need a cab at dc.  Family is support system,  gets medications from [facility].  Pta self ambulatory.    Transition of Care Asessment: Insurance and Status: Insurance coverage has been reviewed Patient has primary care physician: Yes Home environment has been reviewed: home alone Prior level of function:: indep Prior/Current Home Services: No current home services Social Drivers of Health Review: SDOH reviewed no interventions necessary Readmission risk has been reviewed: Yes Transition of care needs: no transition of care needs at this time    Transition of Care Asessment: Insurance and Status: Insurance coverage has been reviewed Patient has primary care physician: Yes Home environment has been reviewed: home alone Prior level of function:: indep Prior/Current Home Services: No current home services Social Drivers of Health Review: SDOH reviewed no interventions necessary Readmission risk has been reviewed: Yes Transition of care needs: no transition of care needs at this time

## 2024-01-25 NOTE — Progress Notes (Signed)
 Rounding Note    Patient Name: Kaitlyn Wright Date of Encounter: 01/25/2024  [facility] Cardiologist:   Subjective   Pt denies CP   Breathing OK   Anxious   Upset  Trying to gain composure with upcoming surgery which she knows she needs   Inpatient Medications    Scheduled Meds:  ALPRAZolam  1 mg Oral QHS   aspirin EC  81 mg Oral Daily   atorvastatin  80 mg Oral q1800   hydrOXYzine  10 mg Oral QHS   latanoprost  1 drop Both Eyes QHS   levothyroxine  88 mcg Oral Q0600   metoprolol tartrate  25 mg Oral BID   Continuous Infusions:  heparin 700 Units/hr (01/24/24 2147)   PRN Meds: acetaminophen  **OR** acetaminophen , ALPRAZolam, naphazoline-glycerin, ondansetron  **OR** ondansetron  (ZOFRAN ) IV, senna-docusate   Vital Signs    Vitals:   01/25/24 0030 01/25/24 0117 01/25/24 0501 01/25/24 0722  BP: (!) 99/48 (!) 122/52 (!) 125/54 134/73  Pulse: 61  75 77  Resp: 19 20 20 19   Temp: 98.2 F (36.8 C)  97.7 F (36.5 C) 98.2 F (36.8 C)  TempSrc: Oral  Oral Oral  SpO2: 95%  97% 97%  Weight:   58.7 kg   Height:        Intake/Output Summary (Last 24 hours) at 01/25/2024 0843 Last data filed at 01/25/2024 1610 Gross per 24 hour  Intake 356 ml  Output 600 ml  Net -244 ml      01/25/2024    5:01 AM 01/24/2024    1:35 PM 01/23/2024    1:22 PM  Last 3 Weights  Weight (lbs) 129 lb 6.6 oz 125 lb 12.8 oz 125 lb  Weight (kg) 58.7 kg 57.063 kg 56.7 kg      Telemetry    SR  - Personally Reviewed  ECG     - Personally Reviewed  Physical Exam   GEN: No acute distress.   Neck: No JVD Cardiac: RRR, no murmur Respiratory: Clear to auscultation GI: Soft, nontender, no masses MS: No edema; Labs    High Sensitivity Troponin:   Recent Labs  Lab 01/23/24 0920 01/23/24 1148 01/24/24 0416  TROPONINIHS 229* 2,503* 6,291*     Chemistry Recent Labs  Lab 01/23/24 0920 01/24/24 0416 01/25/24 0700  NA 135 137 137  K 3.7 4.0 4.0  CL 100 102 105  CO2 24 25 20*   GLUCOSE 161* 123* 101*  BUN 20 18 23   CREATININE 0.87 0.80 0.95  CALCIUM 9.8 9.2 9.0  MG  --   --  2.1  PROT  --  7.0  --   ALBUMIN  --  3.7  --   AST  --  99*  --   ALT  --  33  --   ALKPHOS  --  66  --   BILITOT  --  1.4*  --   GFRNONAA >60 >60 >60  ANIONGAP 11 10 12     Lipids  Recent Labs  Lab 01/24/24 0416  CHOL 237*  TRIG 92  HDL 62  LDLCALC 157*  CHOLHDL 3.8    Hematology Recent Labs  Lab 01/23/24 0920 01/24/24 0416 01/25/24 0700  WBC 12.6* 10.2 11.6*  RBC 4.46 4.52 4.41  HGB 14.2 14.3 13.7  HCT 43.7 43.5 42.1  MCV 98.0 96.2 95.5  MCH 31.8 31.6 31.1  MCHC 32.5 32.9 32.5  RDW 13.2 13.3 13.5  PLT 301 315 277   Thyroid   Recent Labs  Lab 01/23/24 0920  TSH 0.863  FREET4 1.00    BNPNo results for input(s): "BNP", "PROBNP" in the last 168 hours.  DDimer No results for input(s): "DDIMER" in the last 168 hours.   Radiology    ECHOCARDIOGRAM COMPLETE Result Date: 01/24/2024    ECHOCARDIOGRAM REPORT   Patient Name:   Kaitlyn Wright Date of Exam: 01/24/2024 Medical Rec #:  [id number]   Height:       63.0 in Accession #:    [id number]  Weight:       125.8 lb Date of Birth:  10-Aug-1946   BSA:          1.588 m Patient Age:    78 years    BP:           131/97 mmHg Patient Gender: F           HR:           75 bpm. Exam Location:  Inpatient Procedure: 2D Echo, Cardiac Doppler and Color Doppler (Both Spectral and Color            Flow Doppler were utilized during procedure). Indications:    Chest Pain  History:        Patient has no prior history of Echocardiogram examinations.  Sonographer:    Janette Medley Referring Phys: [name] IMPRESSIONS  1. Left ventricular ejection fraction, by estimation, is 60 to 65%. The left ventricle has normal function. The left ventricle has no regional wall motion abnormalities. Left ventricular diastolic parameters are consistent with Grade I diastolic dysfunction (impaired relaxation).  2. Right ventricular systolic function is normal. The  right ventricular size is normal.  3. The mitral valve is normal in structure. No evidence of mitral valve regurgitation. No evidence of mitral stenosis.  4. The aortic valve is normal in structure. Aortic valve regurgitation is not visualized. No aortic stenosis is present.  5. The inferior vena cava is normal in size with greater than 50% respiratory variability, suggesting right atrial pressure of 3 mmHg. FINDINGS  Left Ventricle: Left ventricular ejection fraction, by estimation, is 60 to 65%. The left ventricle has normal function. The left ventricle has no regional wall motion abnormalities. The left ventricular internal cavity size was normal in size. There is  no left ventricular hypertrophy. Left ventricular diastolic parameters are consistent with Grade I diastolic dysfunction (impaired relaxation). Right Ventricle: The right ventricular size is normal. No increase in right ventricular wall thickness. Right ventricular systolic function is normal. Left Atrium: Left atrial size was normal in size. Right Atrium: Right atrial size was normal in size. Pericardium: There is no evidence of pericardial effusion. Mitral Valve: The mitral valve is normal in structure. No evidence of mitral valve regurgitation. No evidence of mitral valve stenosis. Tricuspid Valve: The tricuspid valve is normal in structure. Tricuspid valve regurgitation is not demonstrated. No evidence of tricuspid stenosis. Aortic Valve: The aortic valve is normal in structure. Aortic valve regurgitation is not visualized. No aortic stenosis is present. Pulmonic Valve: The pulmonic valve was normal in structure. Pulmonic valve regurgitation is not visualized. No evidence of pulmonic stenosis. Aorta: The aortic root is normal in size and structure. Venous: The inferior vena cava is normal in size with greater than 50% respiratory variability, suggesting right atrial pressure of 3 mmHg. IAS/Shunts: No atrial level shunt detected by color flow  Doppler.  LEFT VENTRICLE PLAX 2D LVIDd:         3.80 cm   Diastology LVIDs:  2.70 cm   LV e' medial:    6.06 cm/s LV PW:         0.90 cm   LV E/e' medial:  12.4 LV IVS:        0.70 cm   LV e' lateral:   10.40 cm/s LVOT diam:     2.00 cm   LV E/e' lateral: 7.2 LV SV:         44 LV SV Index:   28 LVOT Area:     3.14 cm  RIGHT VENTRICLE             IVC RV S prime:     17.70 cm/s  IVC diam: 1.40 cm TAPSE (M-mode): 1.9 cm LEFT ATRIUM           Index        RIGHT ATRIUM          Index LA Vol (A4C): 28.7 ml 18.07 ml/m  RA Area:     5.86 cm                                    RA Volume:   8.35 ml  5.26 ml/m  AORTIC VALVE LVOT Vmax:   83.70 cm/s LVOT Vmean:  53.250 cm/s LVOT VTI:    0.142 m  AORTA Ao Root diam: 2.20 cm Ao Asc diam:  2.80 cm MITRAL VALVE MV Area (PHT): 5.13 cm    SHUNTS MV Decel Time: 148 msec    Systemic VTI:  0.14 m MV E velocity: 74.90 cm/s  Systemic Diam: 2.00 cm MV A velocity: 83.90 cm/s MV E/A ratio:  0.89 Dorothye Gathers MD Electronically signed by Dorothye Gathers MD Signature Date/Time: 01/24/2024/5:24:14 PM    Final    CARDIAC CATHETERIZATION Result Date: 01/24/2024 Images from the original result were not included. Cardiac Catheterization 01/24/24: Hemodynamic data: LVEDP 11 mmHg.  No pressure gradient across the aortic valve. Angiographic data: Severe diffuse coronary calcification involving the proximal LAD, ramus and CX. LM: Large-caliber vessel, has mild calcification, no significant disease. LAD: Severely diseased from the proximal, mid segment, there are multiple tandem high-grade 90% followed by 60% stenosis in the proximal and mid segment followed by a focal 90% and a tandem 80% on the apical 90% stenosis.  Gives origin to large D1 with a ostial 95% stenosis. RI: Large-caliber vessel distally however there is a long segment proximal 90% stenosis. LCx: Small vessel, has about a 30% mid stenosis. RCA: Has anterior origin and is moderately calcified in the proximal and mid segment and  tortuous.  There is a tandem 30 to 40% mid stenosis followed by a high-grade focal 90% stenosis at the crux.  There are 2 small PDA branches and a large PL branch. Impression and recommendations: Patient has multivessel coronary artery disease.  In spite of multiple tandem lesions in the LAD, she would probably still be benefited by CABG evaluation.  Referral made.   DG Chest Port 1 View Result Date: 01/23/2024 CLINICAL DATA:  Chest pain. EXAM: PORTABLE CHEST 1 VIEW COMPARISON:  12/27/2013. FINDINGS: The heart size and mediastinal contours are within normal limits. Aortic atherosclerosis. Similar eventration of the right hemidiaphragm. Mild bibasilar atelectasis. No focal consolidation, sizeable pleural effusion, or pneumothorax. Remote healed right-sided rib fractures. No acute osseous abnormality. IMPRESSION: Mild bibasilar atelectasis. Otherwise, no acute cardiopulmonary findings. Electronically Signed   By: Mannie Seek M.D.   On: 01/23/2024  10:08    Cardiac Studies   Echo 01/25/24 1. Left ventricular ejection fraction, by estimation, is 60 to 65%. The  left ventricle has normal function. The left ventricle has no regional  wall motion abnormalities. Left ventricular diastolic parameters are  consistent with Grade I diastolic  dysfunction (impaired relaxation).   2. Right ventricular systolic function is normal. The right ventricular  size is normal.   3. The mitral valve is normal in structure. No evidence of mitral valve  regurgitation. No evidence of mitral stenosis.   4. The aortic valve is normal in structure. Aortic valve regurgitation is  not visualized. No aortic stenosis is present.   5. The inferior vena cava is normal in size with greater than 50%  respiratory variability, suggesting right atrial pressure of 3 mmHg.   LHC  01/24/24 Cardiac Catheterization 01/24/24: Hemodynamic data: LVEDP 11 mmHg.  No pressure gradient across the aortic valve.   Angiographic data: Severe  diffuse coronary calcification involving the proximal LAD, ramus and CX. LM: Large-caliber vessel, has mild calcification, no significant disease. LAD: Severely diseased from the proximal, mid segment, there are multiple tandem high-grade 90% followed by 60% stenosis in the proximal and mid segment followed by a focal 90% and a tandem 80% on the apical 90% stenosis.  Gives origin to large D1 with a ostial 95% stenosis. RI: Large-caliber vessel distally however there is a long segment proximal 90% stenosis. LCx: Small vessel, has about a 30% mid stenosis. RCA: Has anterior origin and is moderately calcified in the proximal and mid segment and tortuous.  There is a tandem 30 to 40% mid stenosis followed by a high-grade focal 90% stenosis at the crux.  There are 2 small PDA branches and a large PL branch.    Patient Profile      Kaitlyn Wright is a 78 y.o. female with a hx of anxiety/depression and hypothyroidism who is being seen 01/23/2024 for the evaluation of chest pain at the request of Dr. Martina Sledge.   Assessment & Plan    1  NSTEMI    LHC on 5/5 showed severe CAD LAD and RCA    CVTS reviewing  Pt currently asymptomatic  On IV heparin  2  HL  Pt on atorvastatin  Was on red yeast rice prior Follow   LDL 157        For questions or updates, please contact [facility] Please consult [email or web address] for contact info under        Signed, Ola Berger, MD  01/25/2024, 8:43 AM

## 2024-01-25 NOTE — Progress Notes (Signed)
 PHARMACY - ANTICOAGULATION CONSULT NOTE  Pharmacy Consult for Heparin Indication: chest pain/ACS  Allergies  Allergen Reactions   Naprosyn  [Naproxen ]     Upset stomach    Latex Rash    Patient Measurements: Height: 5\' 3"  (160 cm) Weight: 58.7 kg (129 lb 6.6 oz) IBW/kg (Calculated) : 52.4 HEPARIN DW (KG): 57.1  Vital Signs: Temp: 98.2 F (36.8 C) (05/07 0722) Temp Source: Oral (05/07 0722) BP: 115/58 (05/07 0922) Pulse Rate: 75 (05/07 0922)  Labs: Recent Labs    01/23/24 0920 01/23/24 1148 01/23/24 2330 01/24/24 0416 01/25/24 0700  HGB 14.2  --   --  14.3 13.7  HCT 43.7  --   --  43.5 42.1  PLT 301  --   --  315 277  HEPARINUNFRC  --   --  0.36  --  0.26*  CREATININE 0.87  --   --  0.80 0.95  TROPONINIHS 229* 2,503*  --  6,291*  --     Estimated Creatinine Clearance: 39.1 mL/min (by C-G formula based on SCr of 0.95 mg/dL).   Medical History: Past Medical History:  Diagnosis Date   Anxiety    Depression    Hypothyroidism     Assessment: 78 yo F admit with chest pain/NSTEMI.  Not on anticoagulation PTA.   Baseline CBC WNL.Troponins 229>2503.  Initial heparin level (0.26) subtherapeutic after restarting post cath yesterday.  CBC stable, no issues.  Goal of Therapy:  Heparin level 0.3-0.7 units/ml Monitor platelets by anticoagulation protocol: Yes   Plan:  Increase heparin IV 800 units/hr Check 8h heparin level  Daily heparin level, CBC F/u TCTS plans  Cecillia Cogan, PharmD Clinical Pharmacist 01/25/2024  9:41 AM

## 2024-01-26 ENCOUNTER — Inpatient Hospital Stay (HOSPITAL_COMMUNITY)

## 2024-01-26 DIAGNOSIS — F419 Anxiety disorder, unspecified: Secondary | ICD-10-CM | POA: Diagnosis not present

## 2024-01-26 DIAGNOSIS — E039 Hypothyroidism, unspecified: Secondary | ICD-10-CM | POA: Diagnosis not present

## 2024-01-26 DIAGNOSIS — I2510 Atherosclerotic heart disease of native coronary artery without angina pectoris: Secondary | ICD-10-CM | POA: Diagnosis not present

## 2024-01-26 DIAGNOSIS — I214 Non-ST elevation (NSTEMI) myocardial infarction: Secondary | ICD-10-CM | POA: Diagnosis not present

## 2024-01-26 DIAGNOSIS — Z01810 Encounter for preprocedural cardiovascular examination: Secondary | ICD-10-CM | POA: Diagnosis not present

## 2024-01-26 DIAGNOSIS — F32A Depression, unspecified: Secondary | ICD-10-CM | POA: Diagnosis not present

## 2024-01-26 LAB — BASIC METABOLIC PANEL WITH GFR
Anion gap: 9 (ref 5–15)
BUN: 18 mg/dL (ref 8–23)
CO2: 25 mmol/L (ref 22–32)
Calcium: 9.2 mg/dL (ref 8.9–10.3)
Chloride: 107 mmol/L (ref 98–111)
Creatinine, Ser: 0.92 mg/dL (ref 0.44–1.00)
GFR, Estimated: >60 mL/min (ref >60)
Glucose, Bld: 109 mg/dL — ABNORMAL HIGH (ref 70–99)
Potassium: 4.5 mmol/L (ref 3.5–5.1)
Sodium: 141 mmol/L (ref 135–145)

## 2024-01-26 LAB — CBC
HCT: 40.7 % (ref 36.0–46.0)
Hemoglobin: 13.5 g/dL (ref 12.0–15.0)
MCH: 31.8 pg (ref 26.0–34.0)
MCHC: 33.2 g/dL (ref 30.0–36.0)
MCV: 96 fL (ref 80.0–100.0)
Platelets: 262 10*3/uL (ref 150–400)
RBC: 4.24 MIL/uL (ref 3.87–5.11)
RDW: 13.5 % (ref 11.5–15.5)
WBC: 11.8 10*3/uL — ABNORMAL HIGH (ref 4.0–10.5)
nRBC: 0 % (ref 0.0–0.2)

## 2024-01-26 LAB — HEPARIN LEVEL (UNFRACTIONATED): Heparin Unfractionated: 0.38 [IU]/mL (ref 0.30–0.70)

## 2024-01-26 LAB — ABO/RH: ABO/RH(D): AB POS

## 2024-01-26 LAB — MAGNESIUM: Magnesium: 2.1 mg/dL (ref 1.7–2.4)

## 2024-01-26 MED ORDER — MAGNESIUM SULFATE 50 % IJ SOLN
40.0000 meq | INTRAMUSCULAR | Status: DC
  Filled 2024-01-26: qty 9.85

## 2024-01-26 MED ORDER — DIAZEPAM 5 MG PO TABS
10.0000 mg | ORAL_TABLET | Freq: Once | ORAL | Status: AC
  Administered 2024-01-27: 10 mg via ORAL
  Filled 2024-01-26: qty 2

## 2024-01-26 MED ORDER — METOPROLOL TARTRATE 12.5 MG HALF TABLET
12.5000 mg | ORAL_TABLET | Freq: Once | ORAL | Status: AC
  Administered 2024-01-27: 12.5 mg via ORAL
  Filled 2024-01-26: qty 1

## 2024-01-26 MED ORDER — CEFAZOLIN SODIUM-DEXTROSE 2-4 GM/100ML-% IV SOLN
2.0000 g | INTRAVENOUS | Status: DC
  Filled 2024-01-26: qty 100

## 2024-01-26 MED ORDER — NOREPINEPHRINE 4 MG/250ML-% IV SOLN
0.0000 ug/min | INTRAVENOUS | Status: AC
  Administered 2024-01-27: 5 ug/min via INTRAVENOUS
  Filled 2024-01-26: qty 250

## 2024-01-26 MED ORDER — INSULIN REGULAR(HUMAN) IN NACL 100-0.9 UT/100ML-% IV SOLN
INTRAVENOUS | Status: AC
  Administered 2024-01-27: .8 [IU]/h via INTRAVENOUS
  Filled 2024-01-26: qty 100

## 2024-01-26 MED ORDER — TRANEXAMIC ACID 1000 MG/10ML IV SOLN
1.5000 mg/kg/h | INTRAVENOUS | Status: AC
  Administered 2024-01-27: 1.5 mg/kg/h via INTRAVENOUS
  Filled 2024-01-26: qty 25

## 2024-01-26 MED ORDER — CHLORHEXIDINE GLUCONATE 0.12 % MT SOLN
15.0000 mL | Freq: Once | OROMUCOSAL | Status: AC
  Administered 2024-01-27: 15 mL via OROMUCOSAL
  Filled 2024-01-26: qty 15

## 2024-01-26 MED ORDER — TRANEXAMIC ACID (OHS) PUMP PRIME SOLUTION
2.0000 mg/kg | INTRAVENOUS | Status: DC
  Filled 2024-01-26: qty 1.17

## 2024-01-26 MED ORDER — EPINEPHRINE HCL 5 MG/250ML IV SOLN IN NS
0.0000 ug/min | INTRAVENOUS | Status: AC
  Administered 2024-01-27: 2 ug/min via INTRAVENOUS
  Filled 2024-01-26: qty 250

## 2024-01-26 MED ORDER — VANCOMYCIN HCL 1250 MG/250ML IV SOLN
1250.0000 mg | INTRAVENOUS | Status: AC
  Administered 2024-01-27: 1250 mg via INTRAVENOUS
  Filled 2024-01-26: qty 250

## 2024-01-26 MED ORDER — CHLORHEXIDINE GLUCONATE CLOTH 2 % EX PADS
6.0000 | MEDICATED_PAD | Freq: Once | CUTANEOUS | Status: AC
  Administered 2024-01-26: 6 via TOPICAL

## 2024-01-26 MED ORDER — MILRINONE LACTATE IN DEXTROSE 20-5 MG/100ML-% IV SOLN
0.3000 ug/kg/min | INTRAVENOUS | Status: AC
  Administered 2024-01-27: .3 ug/kg/min via INTRAVENOUS
  Filled 2024-01-26: qty 100

## 2024-01-26 MED ORDER — DEXMEDETOMIDINE HCL IN NACL 400 MCG/100ML IV SOLN
0.1000 ug/kg/h | INTRAVENOUS | Status: AC
  Administered 2024-01-27: .4 ug/kg/h via INTRAVENOUS
  Filled 2024-01-26: qty 100

## 2024-01-26 MED ORDER — TRANEXAMIC ACID (OHS) BOLUS VIA INFUSION
15.0000 mg/kg | INTRAVENOUS | Status: AC
  Administered 2024-01-27: 880.5 mg via INTRAVENOUS
  Filled 2024-01-26: qty 881

## 2024-01-26 MED ORDER — NITROGLYCERIN IN D5W 200-5 MCG/ML-% IV SOLN
2.0000 ug/min | INTRAVENOUS | Status: DC
  Filled 2024-01-26: qty 250

## 2024-01-26 MED ORDER — POTASSIUM CHLORIDE 2 MEQ/ML IV SOLN
80.0000 meq | INTRAVENOUS | Status: DC
  Filled 2024-01-26: qty 40

## 2024-01-26 MED ORDER — PHENYLEPHRINE HCL-NACL 20-0.9 MG/250ML-% IV SOLN
30.0000 ug/min | INTRAVENOUS | Status: AC
  Administered 2024-01-27: 35 ug/min via INTRAVENOUS
  Filled 2024-01-26: qty 250

## 2024-01-26 MED ORDER — CEFAZOLIN SODIUM-DEXTROSE 2-4 GM/100ML-% IV SOLN
2.0000 g | INTRAVENOUS | Status: AC
  Administered 2024-01-27 (×2): 2 g via INTRAVENOUS
  Filled 2024-01-26: qty 100

## 2024-01-26 MED ORDER — HEPARIN 30,000 UNITS/1000 ML (OHS) CELLSAVER SOLUTION
Status: DC
  Filled 2024-01-26: qty 1000

## 2024-01-26 MED ORDER — PLASMA-LYTE A IV SOLN
INTRAVENOUS | Status: DC
  Filled 2024-01-26: qty 2.5

## 2024-01-26 NOTE — Plan of Care (Signed)

## 2024-01-26 NOTE — Progress Notes (Signed)
 VASCULAR LAB    Vein mapping has been performed.  See CV proc for preliminary results.   Estel Scholze, RVT 01/26/2024, 10:55 AM

## 2024-01-26 NOTE — Progress Notes (Signed)
 VASCULAR LAB    Pre CABG Dopplers have been performed.  See CV proc for preliminary results.   Tyshan Enderle, RVT 01/26/2024, 10:55 AM

## 2024-01-26 NOTE — Progress Notes (Signed)
 CARDIAC REHAB PHASE I      Pre-op OHS education including OHS booklet, OHS handout, IS use, mobility importance, home needs at discharge and sternal precautions/move in the tube reviewed. All questions and concerns addressed. Will continue to follow.   Ronny Colas, RN BSN 01/26/2024 3:38 PM

## 2024-01-26 NOTE — Anesthesia Preprocedure Evaluation (Addendum)
 Anesthesia Evaluation  Patient identified by MRN, date of birth, ID band Patient awake    Reviewed: Allergy & Precautions, NPO status , Patient's Chart, lab work & pertinent test results  Airway Mallampati: II  TM Distance: >3 FB Neck ROM: Full    Dental  (+) Dental Advisory Given, Edentulous Lower, Edentulous Upper   Pulmonary neg pulmonary ROS   Pulmonary exam normal breath sounds clear to auscultation       Cardiovascular + Past MI  Normal cardiovascular exam Rhythm:Regular Rate:Normal     Neuro/Psych  PSYCHIATRIC DISORDERS Anxiety Depression    negative neurological ROS     GI/Hepatic Neg liver ROS,GERD  Medicated,,  Endo/Other  Hypothyroidism    Renal/GU negative Renal ROS     Musculoskeletal negative musculoskeletal ROS (+)    Abdominal   Peds  Hematology negative hematology ROS (+)   Anesthesia Other Findings   Reproductive/Obstetrics                             Anesthesia Physical Anesthesia Plan  ASA: 4  Anesthesia Plan: General   Post-op Pain Management:    Induction: Intravenous  PONV Risk Score and Plan: 3 and Midazolam  and Treatment may vary due to age or medical condition  Airway Management Planned: Oral ETT  Additional Equipment: Arterial line, CVP, PA Cath, TEE and Ultrasound Guidance Line Placement  Intra-op Plan:   Post-operative Plan: Post-operative intubation/ventilation  Informed Consent: I have reviewed the patients History and Physical, chart, labs and discussed the procedure including the risks, benefits and alternatives for the proposed anesthesia with the patient or authorized representative who has indicated his/her understanding and acceptance.     Dental advisory given  Plan Discussed with: CRNA  Anesthesia Plan Comments:        Anesthesia Quick Evaluation

## 2024-01-26 NOTE — Plan of Care (Signed)
  Problem: Education: Goal: Understanding of CV disease, CV risk reduction, and recovery process will improve Outcome: Progressing   Problem: Activity: Goal: Risk for activity intolerance will decrease Outcome: Progressing   Problem: Safety: Goal: Ability to remain free from injury will improve Outcome: Progressing

## 2024-01-26 NOTE — Progress Notes (Signed)
 PROGRESS NOTE    Kaitlyn Wright  JXB:[id number] DOB: 1946-01-27 DOA: 01/23/2024 PCP: Roselind Congo, MD    Chief Complaint  Patient presents with   Chest Pain    Brief Narrative:  Kaitlyn Wright is a 78 y.o. female with past medical history significant for hypothyroidism, anxiety/depression who presented to [facility] ED on 01/23/2024 from home with complaints of chest pain.  Localized to the midsternal region, without radiation.   Patient reports that at about a week ago, she woke up in the morning with mild substernal chest discomfort and after she took aspirin 325 mg and 1 mg of alprazolam, it resolved. Last night, she had a big meal around 8:30 PM and took her up alprazolam before bed. She woke up around 3:30 AM with severe substernal chest pressure. She took 1 g of Tylenol , 1 mg of alprazolam and a 325 mg aspirin without significant relief so she presented to the ED for further evaluation. She endorse severe anxiety requiring her to take her alprazolam 1-2 times a day and nightly to sleep. Her chest pain has resolved and she denies any nausea, vomiting, sweats, dizziness, headaches, vision changes, shortness of breath, abdominal pain or palpitations. She does not smoke, drink alcohol or do any illicit drugs. She reports a family history of heart attack in her mother but reports she drank alcohol excessively.   In the ED, temp 98.2, RR 18, HR 64, BP 176/79, SpO2 98% on room air. Labs were significant for troponin to 229->2503, normal kidney function, WBC 12.6, Hgb 14.2, glucose 161, TSH 0.86, free T4 1.00.  EKG shows sinus rhythm with T wave inversions in the inferior lateral leads. CXR shows mild bibasilar atelectasis but no acute cardiopulmonary disease.  Cardiology was consulted for evaluation.  Patient was given alprazolam 1 mg x 1 and started on heparin drip.  TRH was consulted for admission.  Patient seen by cardiology underwent cardiac cath noted to have multiple vessel disease and referral made to CT  surgery for probable CABG.     Assessment & Plan:   Principal Problem:   NSTEMI (non-ST elevated myocardial infarction) (HCC) Active Problems:   Elevated troponin   Anxiety and depression   Hypothyroidism  #1 non-STEMI -Patient had presented with mid/substernal chest pressure awakening from sleep. - Troponins noted at 229>> 2503>>> 6291. - EKG done with T wave inversions in the inferior leads. - Patient placed on heparin drip.  Cardiology consulted. - Patient underwent cardiac catheterization 01/24/2024 with multivessel CAD with recommendations for CT referral for probable CABG. -2D echo with EF of 60 to 65%,NWMA, grade 1 diastolic dysfunction. - CT surgery consulted and patient to undergo CABG tomorrow. - Continue heparin drip, metoprolol 25 mg twice daily, aspirin 81 mg daily, atorvastatin 80 mg daily. - Per cardiology and CT surgery.  2.  Depression/anxiety -Continue alprazolam 1 mg scheduled nightly. - Decreased alprazolam to 0.5 mg daily as needed anxiety as patient states full dose alprazolam causes some sleepiness per RN. - Continue hydroxyzine 10 mg nightly. - Continue hydroxyzine 25 mg p.o. every 8 hours as needed anxiety.  3.  Hypothyroidism -TSH 0.863. - Free T4  1.00. - Continue home regimen Synthroid.  - Outpatient follow-up.    DVT prophylaxis: Heparin GTT Code Status: Full Family Communication: Updated patient.  No family at bedside.   Disposition: Likely short-term SNF post CABG.  Status is: Inpatient Remains inpatient appropriate because: Severity of illness   Consultants:  Cardiology: Dr. Pearly Bound 01/23/2024 Cardiothoracic surgery: Dr. Luna Salinas  01/24/2024  Procedures:  Cardiac catheterization 01/24/2024 per Dr. Berry Bristol 2D echo 01/24/2024   Antimicrobials:  Anti-infectives (From admission, onward)    Start     Dose/Rate Route Frequency Ordered Stop   01/27/24 0400  vancomycin (VANCOREADY) IVPB 1250 mg/250 mL        1,250 mg 166.7 mL/hr over 90 Minutes  Intravenous To Surgery 01/26/24 1037 01/28/24 0400   01/27/24 0400  ceFAZolin (ANCEF) IVPB 2g/100 mL premix        2 g 200 mL/hr over 30 Minutes Intravenous To Surgery 01/26/24 1037 01/28/24 0400   01/27/24 0400  ceFAZolin (ANCEF) IVPB 2g/100 mL premix        2 g 200 mL/hr over 30 Minutes Intravenous To Surgery 01/26/24 1037 01/28/24 0400         Subjective: Patient laying in bed.  Denies any chest pain or shortness of breath.  No abdominal pain.  Stated she saw the CT surgeon today.     Objective: Vitals:   01/26/24 0737 01/26/24 1108 01/26/24 1111 01/26/24 1624  BP: 122/70 132/67 132/67 130/67  Pulse: 66 73 73 77  Resp: 19  20 20   Temp: (!) 97.3 F (36.3 C)  98.4 F (36.9 C) 98.1 F (36.7 C)  TempSrc: Oral  Oral Oral  SpO2: 96%  97% 100%  Weight:      Height:        Intake/Output Summary (Last 24 hours) at 01/26/2024 1733 Last data filed at 01/26/2024 1315 Gross per 24 hour  Intake 727.69 ml  Output 675 ml  Net 52.69 ml   Filed Weights   01/23/24 1322 01/24/24 1335 01/25/24 0501  Weight: 56.7 kg 57.1 kg 58.7 kg    Examination:  General exam: NAD. Respiratory system: CTAB.  No wheezes, no crackles, no rhonchi.  Fair air movement.  Speaking in full sentences.  Cardiovascular system: Regular rate rhythm no murmurs rubs or gallops.  No JVD.  No pitting lower extremity edema.  Gastrointestinal system: Abdomen is soft, nontender, nondistended, positive bowel sounds.  No rebound.  No guarding. Central nervous system: Alert and oriented. No focal neurological deficits. Extremities: Symmetric 5 x 5 power. Skin: No rashes, lesions or ulcers Psychiatry: Judgement and insight appear normal. Mood & affect appropriate.     Data Reviewed: I have personally reviewed following labs and imaging studies  CBC: Recent Labs  Lab 01/23/24 0920 01/24/24 0416 01/25/24 0700 01/26/24 0307  WBC 12.6* 10.2 11.6* 11.8*  HGB 14.2 14.3 13.7 13.5  HCT 43.7 43.5 42.1 40.7  MCV 98.0  96.2 95.5 96.0  PLT 301 315 277 262    Basic Metabolic Panel: Recent Labs  Lab 01/23/24 0920 01/24/24 0416 01/25/24 0700 01/26/24 0307  NA 135 137 137 141  K 3.7 4.0 4.0 4.5  CL 100 102 105 107  CO2 24 25 20* 25  GLUCOSE 161* 123* 101* 109*  BUN 20 18 23 18   CREATININE 0.87 0.80 0.95 0.92  CALCIUM 9.8 9.2 9.0 9.2  MG  --   --  2.1 2.1    GFR: Estimated Creatinine Clearance: 40.3 mL/min (by C-G formula based on SCr of 0.92 mg/dL).  Liver Function Tests: Recent Labs  Lab 01/24/24 0416  AST 99*  ALT 33  ALKPHOS 66  BILITOT 1.4*  PROT 7.0  ALBUMIN 3.7    CBG: No results for input(s): "GLUCAP" in the last 168 hours.   No results found for this or any previous visit (from the past 240 hours).  Radiology Studies: VAS US  DOPPLER PRE CABG Result Date: 01/26/2024 PREOPERATIVE VASCULAR EVALUATION Patient Name:  MARNEISHA MILLIEN  Date of Exam:   01/26/2024 Medical Rec #: [id number]    Accession #:    [id number] Date of Birth: [birth year]    Patient Gender: F Patient Age:   6 years Exam Location:  [facility] Procedure:      VAS US  DOPPLER PRE CABG Referring Phys: Landon Pinion HENDRICKSON --------------------------------------------------------------------------------  Indications:      Pre-CABG. Risk Factors:     Hyperlipidemia, past history of smoking, prior MI, coronary                   artery disease. Limitations:      Unable to do Allen's test on right arm secondary to radial                   cath and patient's pain Comparison Study: No prior study on file Performing Technologist: Carleene Chase RVS  Examination Guidelines: A complete evaluation includes B-mode imaging, spectral Doppler, color Doppler, and power Doppler as needed of all accessible portions of each vessel. Bilateral testing is considered an integral part of a complete examination. Limited examinations for reoccurring indications may be performed as noted.  Right Carotid Findings:  +----------+--------+--------+--------+------------+--------+           PSV cm/sEDV cm/sStenosisDescribe    Comments +----------+--------+--------+--------+------------+--------+ CCA Prox  111     21              heterogenous         +----------+--------+--------+--------+------------+--------+ CCA Distal74      18              heterogenous         +----------+--------+--------+--------+------------+--------+ ICA Prox  101     16      1-39%   calcific             +----------+--------+--------+--------+------------+--------+ ICA Mid   83      14                                   +----------+--------+--------+--------+------------+--------+ ICA Distal95      17                                   +----------+--------+--------+--------+------------+--------+ ECA       96      2                                    +----------+--------+--------+--------+------------+--------+ +----------+--------+-------+---------+------------+           PSV cm/sEDV cmsDescribe Arm Pressure +----------+--------+-------+---------+------------+ Subclavian200            Turbulent             +----------+--------+-------+---------+------------+ +---------+--------+--+--------+-+---------+ VertebralPSV cm/s46EDV cm/s6Antegrade +---------+--------+--+--------+-+---------+ Left Carotid Findings: +----------+--------+--------+--------+---------------------+------------------+           PSV cm/sEDV cm/sStenosisDescribe             Comments           +----------+--------+--------+--------+---------------------+------------------+ CCA Prox  116     21  intimal thickening +----------+--------+--------+--------+---------------------+------------------+ CCA Distal97      18                                   intimal thickening +----------+--------+--------+--------+---------------------+------------------+ ICA Prox  142     39       40-59%  calcific and                                                              irregular                               +----------+--------+--------+--------+---------------------+------------------+ ICA Mid   122     20                                                      +----------+--------+--------+--------+---------------------+------------------+ ICA Distal69      13                                                      +----------+--------+--------+--------+---------------------+------------------+ ECA       96      1                                                       +----------+--------+--------+--------+---------------------+------------------+  +----------+--------+--------+---------+------------+ SubclavianPSV cm/sEDV cm/sDescribe Arm Pressure +----------+--------+--------+---------+------------+           200             Turbulent             +----------+--------+--------+---------+------------+ +---------+--------+--+--------+--+---------+ VertebralPSV cm/s62EDV cm/s10Antegrade +---------+--------+--+--------+--+---------+  ABI Findings: +------------------+-----+-----------+ Rt Pressure (mmHg)IndexWaveform    +------------------+-----+-----------+ 121                    triphasic   +------------------+-----+-----------+ 96                0.79 multiphasic +------------------+-----+-----------+ 132               1.09 multiphasic +------------------+-----+-----------+ 0                 0.00 Absent      +------------------+-----+-----------+ +------------------+-----+-----------+ Lt Pressure (mmHg)IndexWaveform    +------------------+-----+-----------+ 119                    triphasic   +------------------+-----+-----------+ 140               1.16 multiphasic +------------------+-----+-----------+ 118               0.98 multiphasic +------------------+-----+-----------+ 45                0.37 Abnormal     +------------------+-----+-----------+ +-------+---------------+ ABI/TBIToday's ABI/TBI +-------+---------------+ Right  1.09/absent     +-------+---------------+  Left   1.16/0.37       +-------+---------------+  Right Doppler Findings: +--------+--------+---------+ Site    PressureDoppler   +--------+--------+---------+ [id number]     triphasic +--------+--------+---------+ Radial          triphasic +--------+--------+---------+ Ulnar           triphasic +--------+--------+---------+  Left Doppler Findings: +--------+--------+---------+ Site    PressureDoppler   +--------+--------+---------+ [id number]     triphasic +--------+--------+---------+ Radial          triphasic +--------+--------+---------+ Ulnar           triphasic +--------+--------+---------+  Summary: Right Carotid: Velocities in the right ICA are consistent with a 1-39% stenosis. Left Carotid: Velocities in the left ICA are consistent with a 40-59% stenosis. Vertebrals:  Bilateral vertebral arteries demonstrate antegrade flow. Subclavians: Bilateral subclavian artery flow was disturbed. Right ABI: Resting right ankle-brachial index is within normal range. The right toe-brachial index is abnormal. Left ABI: Resting left ankle-brachial index is within normal range. The left toe-brachial index is abnormal. Right Upper Extremity: Limited secondary to radial cath and pt pain. Left Upper Extremity: Doppler waveforms remain within normal limits with left radial compression. Doppler waveform obliterate with left ulnar compression.    Preliminary    VAS US  LOWER EXTREMITY SAPHENOUS VEIN MAPPING Result Date: 01/26/2024 LOWER EXTREMITY VEIN MAPPING Patient Name:  KATALAYA SEIDER  Date of Exam:   01/26/2024 Medical Rec #: [id number]    Accession #:    [id number] Date of Birth: September 26, 1945    Patient Gender: F Patient Age:   79 years Exam Location:  [facility] Procedure:      VAS US  LOWER EXTREMITY SAPHENOUS VEIN  MAPPING Referring Phys: Landon Pinion HENDRICKSON --------------------------------------------------------------------------------  Indications:        Pre-op Risk Factors:       Coronary artery disease. Other Risk Factors: Varicosities.  Comparison Study: No prior study on file Performing Technologist: Carleene Chase RVS  Examination Guidelines: A complete evaluation includes B-mode imaging, spectral Doppler, color Doppler, and power Doppler as needed of all accessible portions of each vessel. Bilateral testing is considered an integral part of a complete examination. Limited examinations for reoccurring indications may be performed as noted. +--------------+-------------+------------------+---------------+--------------+  RT Diameter   RT Findings        GSV          LT Diameter   LT Findings        (cm)                                         (cm)                     +--------------+-------------+------------------+---------------+--------------+      0.37       branching    Saphenofemoral       0.72                                                     Junction                                   +--------------+-------------+------------------+---------------+--------------+   0.30/0.18     branching  Proximal thigh       0.56                     +--------------+-------------+------------------+---------------+--------------+   0.28/0.17     branching      Mid thigh     0.36/0.39/0.17   branching    +--------------+-------------+------------------+---------------+--------------+      0.23                     Distal thigh    0.41/036/0.17   branching    +--------------+-------------+------------------+---------------+--------------+      0.20                         Knee          0.37/0.31                  +--------------+-------------+------------------+---------------+--------------+   0.19/0.17     branching      Prox calf        0.27/0.21      tortuous                                                                  varicosities                                                              and branching  +--------------+-------------+------------------+---------------+--------------+   0.24/0.17     branching       Mid calf        0.32/0.12   branching and                                                                 tortuous                                                                 varicosities  +--------------+-------------+------------------+---------------+--------------+   0.20/0.15     branching     Distal calf       0.24/0.20     branching    +--------------+-------------+------------------+---------------+--------------+   0.28/0.16   branching and      Ankle          0.20/0.19   branching and                  tortuous                                       tortuous  varicosities  +--------------+-------------+------------------+---------------+--------------+    Preliminary         Scheduled Meds:  ALPRAZolam  1 mg Oral QHS   aspirin EC  81 mg Oral Daily   atorvastatin  80 mg Oral q1800   [START ON 01/27/2024] chlorhexidine  15 mL Mouth/Throat Once   Chlorhexidine Gluconate Cloth  6 each Topical Once   cholecalciferol  1,000 Units Oral Daily   [START ON 01/27/2024] diazepam  10 mg Oral Once   [START ON 01/27/2024] epinephrine  0-10 mcg/min Intravenous To OR   [START ON 01/27/2024] heparin sodium (porcine) 2,500 Units, papaverine 30 mg in electrolyte-A (PLASMALYTE-A PH 7.4) 500 mL irrigation   Irrigation To OR   hydrOXYzine  10 mg Oral QHS   [START ON 01/27/2024] insulin   Intravenous To OR   latanoprost  1 drop Both Eyes QHS   levothyroxine  88 mcg Oral Q0600   [START ON 01/27/2024] magnesium sulfate  40 mEq Other To OR   [START ON 01/27/2024] metoprolol tartrate  12.5 mg Oral Once   metoprolol tartrate  25 mg Oral BID   omega-3 acid  ethyl esters  1 g Oral BID   pantoprazole  40 mg Oral Daily   [START ON 01/27/2024] phenylephrine  30-200 mcg/min Intravenous To OR   [START ON 01/27/2024] potassium chloride  80 mEq Other To OR   [START ON 01/27/2024] tranexamic acid  15 mg/kg Intravenous To OR   [START ON 01/27/2024] tranexamic acid  2 mg/kg Intracatheter To OR   Continuous Infusions:  [START ON 01/27/2024]  ceFAZolin (ANCEF) IV     [START ON 01/27/2024]  ceFAZolin (ANCEF) IV     [START ON 01/27/2024] dexmedetomidine     [START ON 01/27/2024] heparin 30,000 units/NS 1000 mL solution for CELLSAVER     heparin 800 Units/hr (01/26/24 0614)   [START ON 01/27/2024] milrinone     [START ON 01/27/2024] nitroGLYCERIN     [START ON 01/27/2024] norepinephrine     [START ON 01/27/2024] tranexamic acid (CYKLOKAPRON) 2,500 mg in sodium chloride 0.9 % 250 mL (10 mg/mL) infusion     [START ON 01/27/2024] vancomycin       LOS: 3 days    Time spent: 35 minutes    Hilda Lovings, MD [facility]   To contact the attending provider between 7A-7P or the covering provider during after hours 7P-7A, please log into the web site [email or web address] and access using universal [place] password for that web site. If you do not have the password, please call the hospital operator.  01/26/2024, 5:33 PM

## 2024-01-26 NOTE — Care Management Important Message (Signed)
 Important Message  Patient Details  Name: Kaitlyn Wright MRN: [id number] Date of Birth: 21-May-1946   Important Message Given:  Yes - Medicare IM     Wynonia Hedges 01/26/2024, 4:12 PM

## 2024-01-26 NOTE — Progress Notes (Signed)
 PHARMACY - ANTICOAGULATION CONSULT NOTE  Pharmacy Consult for Heparin Indication: chest pain/ACS  Allergies  Allergen Reactions   Naprosyn  [Naproxen ]     Upset stomach    Latex Rash    Patient Measurements: Height: 5\' 3"  (160 cm) Weight: 58.7 kg (129 lb 6.6 oz) IBW/kg (Calculated) : 52.4 HEPARIN DW (KG): 57.1  Vital Signs: Temp: 96.2 F (35.7 C) (05/08 0354) Temp Source: Oral (05/08 0354) BP: 109/57 (05/08 0354) Pulse Rate: 64 (05/08 0354)  Labs: Recent Labs    01/23/24 0920 01/23/24 1148 01/23/24 2330 01/24/24 0416 01/25/24 0700 01/25/24 2037 01/26/24 0307  HGB 14.2  --   --  14.3 13.7  --  13.5  HCT 43.7  --   --  43.5 42.1  --  40.7  PLT 301  --   --  315 277  --  262  HEPARINUNFRC  --   --    < >  --  0.26* 0.52 0.38  CREATININE 0.87  --   --  0.80 0.95  --  0.92  TROPONINIHS 229* 2,503*  --  6,291*  --   --   --    < > = values in this interval not displayed.    Estimated Creatinine Clearance: 40.3 mL/min (by C-G formula based on SCr of 0.92 mg/dL).   Medical History: Past Medical History:  Diagnosis Date   Anxiety    Depression    Hypothyroidism     Assessment: 78 yo F admit with chest pain/NSTEMI.  Not on anticoagulation PTA.   Baseline CBC WNL.Troponins 229>2503.  Heparin level (0.38) is therapeutic on 800 units/hr.  CBC stable.   Goal of Therapy:  Heparin level 0.3-0.7 units/ml Monitor platelets by anticoagulation protocol: Yes   Plan:  Continue heparin 800 units/h Daily heparin level and CBC  Cecillia Cogan, PharmD Clinical Pharmacist 01/26/2024  7:20 AM

## 2024-01-26 NOTE — Progress Notes (Signed)
 2 Days Post-Op Procedure(s) (LRB): LEFT HEART CATH AND CORONARY ANGIOGRAPHY (N/A) Subjective: No complaints, very anxious about her dogs, surgery, postop plans  Objective: Vital signs in last 24 hours: Temp:  [96.2 F (35.7 C)-98.1 F (36.7 C)] 97.3 F (36.3 C) (05/08 0737) Pulse Rate:  [64-89] 66 (05/08 0737) Cardiac Rhythm: Normal sinus rhythm (05/07 1900) Resp:  [16-20] 19 (05/08 0737) BP: (109-147)/(53-89) 122/70 (05/08 0737) SpO2:  [96 %-98 %] 96 % (05/08 0737)  Hemodynamic parameters for last 24 hours:    Intake/Output from previous day: 05/07 0701 - 05/08 0700 In: 1039.4 [P.O.:960; I.V.:79.4] Out: 400 [Urine:400] Intake/Output this shift: Total I/O In: 240 [P.O.:240] Out: -   General appearance: alert, cooperative, and no distress Neurologic: intact Heart: regular rate and rhythm Lungs: clear to auscultation bilaterally  Lab Results: Recent Labs    01/25/24 0700 01/26/24 0307  WBC 11.6* 11.8*  HGB 13.7 13.5  HCT 42.1 40.7  PLT 277 262   BMET:  Recent Labs    01/25/24 0700 01/26/24 0307  NA 137 141  K 4.0 4.5  CL 105 107  CO2 20* 25  GLUCOSE 101* 109*  BUN 23 18  CREATININE 0.95 0.92  CALCIUM 9.0 9.2    PT/INR: No results for input(s): "LABPROT", "INR" in the last 72 hours. ABG No results found for: "PHART", "HCO3", "TCO2", "ACIDBASEDEF", "O2SAT" CBG (last 3)  No results for input(s): "GLUCAP" in the last 72 hours.  Assessment/Plan: S/P Procedure(s) (LRB): LEFT HEART CATH AND CORONARY ANGIOGRAPHY (N/A) 3 vessel CAD s/p non-STEMI Needs CABG Plan CABG tomorrow She will need a short term SNF postop- will ask case management to see She is aware of risks and benefits but seemed surprised by surgery being tomorrow even though I told her that yesterday   LOS: 3 days    Zelphia Higashi 01/26/2024

## 2024-01-27 ENCOUNTER — Other Ambulatory Visit: Payer: Self-pay

## 2024-01-27 ENCOUNTER — Inpatient Hospital Stay (HOSPITAL_COMMUNITY)
Admission: EM | Disposition: A | Discharge status: Skilled Nursing Facility | Payer: Self-pay | Source: Home / Self Care | Attending: Thoracic Surgery (Cardiothoracic Vascular Surgery)

## 2024-01-27 ENCOUNTER — Inpatient Hospital Stay (HOSPITAL_COMMUNITY): Payer: Self-pay | Admitting: Anesthesiology

## 2024-01-27 ENCOUNTER — Encounter (HOSPITAL_COMMUNITY): Payer: Self-pay | Admitting: Student

## 2024-01-27 ENCOUNTER — Inpatient Hospital Stay (HOSPITAL_COMMUNITY)

## 2024-01-27 DIAGNOSIS — I2510 Atherosclerotic heart disease of native coronary artery without angina pectoris: Secondary | ICD-10-CM

## 2024-01-27 DIAGNOSIS — E039 Hypothyroidism, unspecified: Secondary | ICD-10-CM | POA: Diagnosis not present

## 2024-01-27 DIAGNOSIS — F418 Other specified anxiety disorders: Secondary | ICD-10-CM

## 2024-01-27 DIAGNOSIS — Z951 Presence of aortocoronary bypass graft: Secondary | ICD-10-CM

## 2024-01-27 LAB — POCT I-STAT, CHEM 8
BUN: 21 mg/dL (ref 8–23)
BUN: 15 mg/dL (ref 8–23)
BUN: 18 mg/dL (ref 8–23)
BUN: 16 mg/dL (ref 8–23)
BUN: 17 mg/dL (ref 8–23)
Calcium, Ion: 1.15 mmol/L (ref 1.15–1.40)
Calcium, Ion: 0.92 mmol/L — ABNORMAL LOW (ref 1.15–1.40)
Calcium, Ion: 1.18 mmol/L (ref 1.15–1.40)
Calcium, Ion: 0.92 mmol/L — ABNORMAL LOW (ref 1.15–1.40)
Calcium, Ion: 1.09 mmol/L — ABNORMAL LOW (ref 1.15–1.40)
Chloride: 109 mmol/L (ref 98–111)
Chloride: 104 mmol/L (ref 98–111)
Chloride: 109 mmol/L (ref 98–111)
Chloride: 108 mmol/L (ref 98–111)
Chloride: 107 mmol/L (ref 98–111)
Creatinine, Ser: 0.9 mg/dL (ref 0.44–1.00)
Creatinine, Ser: 0.7 mg/dL (ref 0.44–1.00)
Creatinine, Ser: 0.8 mg/dL (ref 0.44–1.00)
Creatinine, Ser: 0.7 mg/dL (ref 0.44–1.00)
Creatinine, Ser: 0.7 mg/dL (ref 0.44–1.00)
Glucose, Bld: 132 mg/dL — ABNORMAL HIGH (ref 70–99)
Glucose, Bld: 123 mg/dL — ABNORMAL HIGH (ref 70–99)
Glucose, Bld: 140 mg/dL — ABNORMAL HIGH (ref 70–99)
Glucose, Bld: 141 mg/dL — ABNORMAL HIGH (ref 70–99)
Glucose, Bld: 192 mg/dL — ABNORMAL HIGH (ref 70–99)
HCT: 33 % — ABNORMAL LOW (ref 36.0–46.0)
HCT: 22 % — ABNORMAL LOW (ref 36.0–46.0)
HCT: 33 % — ABNORMAL LOW (ref 36.0–46.0)
HCT: 19 % — ABNORMAL LOW (ref 36.0–46.0)
HCT: 26 % — ABNORMAL LOW (ref 36.0–46.0)
Hemoglobin: 11.2 g/dL — ABNORMAL LOW (ref 12.0–15.0)
Hemoglobin: 11.2 g/dL — ABNORMAL LOW (ref 12.0–15.0)
Hemoglobin: 7.5 g/dL — ABNORMAL LOW (ref 12.0–15.0)
Hemoglobin: 6.5 g/dL — CL (ref 12.0–15.0)
Hemoglobin: 8.8 g/dL — ABNORMAL LOW (ref 12.0–15.0)
Potassium: 3.4 mmol/L — ABNORMAL LOW (ref 3.5–5.1)
Potassium: 3.8 mmol/L (ref 3.5–5.1)
Potassium: 3.9 mmol/L (ref 3.5–5.1)
Potassium: 5.3 mmol/L — ABNORMAL HIGH (ref 3.5–5.1)
Potassium: 5 mmol/L (ref 3.5–5.1)
Sodium: 141 mmol/L (ref 135–145)
Sodium: 140 mmol/L (ref 135–145)
Sodium: 140 mmol/L (ref 135–145)
Sodium: 139 mmol/L (ref 135–145)
Sodium: 139 mmol/L (ref 135–145)
TCO2: 24 mmol/L (ref 22–32)
TCO2: 23 mmol/L (ref 22–32)
TCO2: 24 mmol/L (ref 22–32)
TCO2: 26 mmol/L (ref 22–32)
TCO2: 21 mmol/L — ABNORMAL LOW (ref 22–32)

## 2024-01-27 LAB — CBC
HCT: 32.8 % — ABNORMAL LOW (ref 36.0–46.0)
HCT: 28 % — ABNORMAL LOW (ref 36.0–46.0)
Hemoglobin: 11 g/dL — ABNORMAL LOW (ref 12.0–15.0)
Hemoglobin: 9.4 g/dL — ABNORMAL LOW (ref 12.0–15.0)
MCH: 30.9 pg (ref 26.0–34.0)
MCH: 31.1 pg (ref 26.0–34.0)
MCHC: 33.5 g/dL (ref 30.0–36.0)
MCHC: 33.6 g/dL (ref 30.0–36.0)
MCV: 92.1 fL (ref 80.0–100.0)
MCV: 92.7 fL (ref 80.0–100.0)
Platelets: 43 10*3/uL — ABNORMAL LOW (ref 150–400)
Platelets: 144 10*3/uL — ABNORMAL LOW (ref 150–400)
RBC: 3.56 MIL/uL — ABNORMAL LOW (ref 3.87–5.11)
RBC: 3.02 MIL/uL — ABNORMAL LOW (ref 3.87–5.11)
RDW: 14 % (ref 11.5–15.5)
RDW: 14.8 % (ref 11.5–15.5)
WBC: 13.2 10*3/uL — ABNORMAL HIGH (ref 4.0–10.5)
WBC: 11.6 10*3/uL — ABNORMAL HIGH (ref 4.0–10.5)
nRBC: 0 % (ref 0.0–0.2)
nRBC: 0 % (ref 0.0–0.2)

## 2024-01-27 LAB — POCT I-STAT 7, (LYTES, BLD GAS, ICA,H+H)
Acid-base deficit: 5 mmol/L — ABNORMAL HIGH (ref 0.0–2.0)
Acid-base deficit: 8 mmol/L — ABNORMAL HIGH (ref 0.0–2.0)
Acid-base deficit: 5 mmol/L — ABNORMAL HIGH (ref 0.0–2.0)
Acid-base deficit: 7 mmol/L — ABNORMAL HIGH (ref 0.0–2.0)
Acid-base deficit: 10 mmol/L — ABNORMAL HIGH (ref 0.0–2.0)
Acid-base deficit: 4 mmol/L — ABNORMAL HIGH (ref 0.0–2.0)
Acid-base deficit: 6 mmol/L — ABNORMAL HIGH (ref 0.0–2.0)
Bicarbonate: 20.7 mmol/L (ref 20.0–28.0)
Bicarbonate: 17.1 mmol/L — ABNORMAL LOW (ref 20.0–28.0)
Bicarbonate: 18.9 mmol/L — ABNORMAL LOW (ref 20.0–28.0)
Bicarbonate: 20.3 mmol/L (ref 20.0–28.0)
Bicarbonate: 17.6 mmol/L — ABNORMAL LOW (ref 20.0–28.0)
Bicarbonate: 20.1 mmol/L (ref 20.0–28.0)
Bicarbonate: 21.6 mmol/L (ref 20.0–28.0)
Calcium, Ion: 1.16 mmol/L (ref 1.15–1.40)
Calcium, Ion: 0.92 mmol/L — ABNORMAL LOW (ref 1.15–1.40)
Calcium, Ion: 0.9 mmol/L — ABNORMAL LOW (ref 1.15–1.40)
Calcium, Ion: 1.09 mmol/L — ABNORMAL LOW (ref 1.15–1.40)
Calcium, Ion: 0.92 mmol/L — ABNORMAL LOW (ref 1.15–1.40)
Calcium, Ion: 0.95 mmol/L — ABNORMAL LOW (ref 1.15–1.40)
Calcium, Ion: 1.51 mmol/L (ref 1.15–1.40)
HCT: 33 % — ABNORMAL LOW (ref 36.0–46.0)
HCT: 25 % — ABNORMAL LOW (ref 36.0–46.0)
HCT: 18 % — ABNORMAL LOW (ref 36.0–46.0)
HCT: 24 % — ABNORMAL LOW (ref 36.0–46.0)
HCT: 26 % — ABNORMAL LOW (ref 36.0–46.0)
HCT: 30 % — ABNORMAL LOW (ref 36.0–46.0)
HCT: 32 % — ABNORMAL LOW (ref 36.0–46.0)
Hemoglobin: 11.2 g/dL — ABNORMAL LOW (ref 12.0–15.0)
Hemoglobin: 8.5 g/dL — ABNORMAL LOW (ref 12.0–15.0)
Hemoglobin: 6.1 g/dL — CL (ref 12.0–15.0)
Hemoglobin: 8.2 g/dL — ABNORMAL LOW (ref 12.0–15.0)
Hemoglobin: 8.8 g/dL — ABNORMAL LOW (ref 12.0–15.0)
Hemoglobin: 10.2 g/dL — ABNORMAL LOW (ref 12.0–15.0)
Hemoglobin: 10.9 g/dL — ABNORMAL LOW (ref 12.0–15.0)
O2 Saturation: 100 %
O2 Saturation: 100 %
O2 Saturation: 100 %
O2 Saturation: 100 %
O2 Saturation: 100 %
O2 Saturation: 100 %
O2 Saturation: 97 %
Patient temperature: 36
Patient temperature: 35.4
Patient temperature: 36
Potassium: 3.4 mmol/L — ABNORMAL LOW (ref 3.5–5.1)
Potassium: 3.9 mmol/L (ref 3.5–5.1)
Potassium: 5 mmol/L (ref 3.5–5.1)
Potassium: 5.1 mmol/L (ref 3.5–5.1)
Potassium: 5.1 mmol/L (ref 3.5–5.1)
Potassium: 4.6 mmol/L (ref 3.5–5.1)
Potassium: 4.6 mmol/L (ref 3.5–5.1)
Sodium: 141 mmol/L (ref 135–145)
Sodium: 140 mmol/L (ref 135–145)
Sodium: 139 mmol/L (ref 135–145)
Sodium: 141 mmol/L (ref 135–145)
Sodium: 140 mmol/L (ref 135–145)
Sodium: 141 mmol/L (ref 135–145)
Sodium: 142 mmol/L (ref 135–145)
TCO2: 22 mmol/L (ref 22–32)
TCO2: 18 mmol/L — ABNORMAL LOW (ref 22–32)
TCO2: 20 mmol/L — ABNORMAL LOW (ref 22–32)
TCO2: 21 mmol/L — ABNORMAL LOW (ref 22–32)
TCO2: 19 mmol/L — ABNORMAL LOW (ref 22–32)
TCO2: 21 mmol/L — ABNORMAL LOW (ref 22–32)
TCO2: 23 mmol/L (ref 22–32)
pCO2 arterial: 39.9 mmHg (ref 32–48)
pCO2 arterial: 33.3 mmHg (ref 32–48)
pCO2 arterial: 35.7 mmHg (ref 32–48)
pCO2 arterial: 40.8 mmHg (ref 32–48)
pCO2 arterial: 42.8 mmHg (ref 32–48)
pCO2 arterial: 37.9 mmHg (ref 32–48)
pCO2 arterial: 39.6 mmHg (ref 32–48)
pH, Arterial: 7.323 — ABNORMAL LOW (ref 7.35–7.45)
pH, Arterial: 7.318 — ABNORMAL LOW (ref 7.35–7.45)
pH, Arterial: 7.275 — ABNORMAL LOW (ref 7.35–7.45)
pH, Arterial: 7.362 (ref 7.35–7.45)
pH, Arterial: 7.216 — ABNORMAL LOW (ref 7.35–7.45)
pH, Arterial: 7.308 — ABNORMAL LOW (ref 7.35–7.45)
pH, Arterial: 7.356 (ref 7.35–7.45)
pO2, Arterial: 332 mmHg — ABNORMAL HIGH (ref 83–108)
pO2, Arterial: 473 mmHg — ABNORMAL HIGH (ref 83–108)
pO2, Arterial: 271 mmHg — ABNORMAL HIGH (ref 83–108)
pO2, Arterial: 350 mmHg — ABNORMAL HIGH (ref 83–108)
pO2, Arterial: 264 mmHg — ABNORMAL HIGH (ref 83–108)
pO2, Arterial: 286 mmHg — ABNORMAL HIGH (ref 83–108)
pO2, Arterial: 89 mmHg (ref 83–108)

## 2024-01-27 LAB — PREPARE RBC (CROSSMATCH)

## 2024-01-27 LAB — COOXEMETRY PANEL
Carboxyhemoglobin: 1.4 % (ref 0.5–1.5)
Methemoglobin: <0.7 % (ref 0.0–1.5)
O2 Saturation: 69.7 %
Total hemoglobin: 11.5 g/dL — ABNORMAL LOW (ref 12.0–16.0)

## 2024-01-27 LAB — GLUCOSE, CAPILLARY
Glucose-Capillary: 131 mg/dL — ABNORMAL HIGH (ref 70–99)
Glucose-Capillary: 152 mg/dL — ABNORMAL HIGH (ref 70–99)
Glucose-Capillary: 183 mg/dL — ABNORMAL HIGH (ref 70–99)
Glucose-Capillary: 189 mg/dL — ABNORMAL HIGH (ref 70–99)
Glucose-Capillary: 189 mg/dL — ABNORMAL HIGH (ref 70–99)
Glucose-Capillary: 199 mg/dL — ABNORMAL HIGH (ref 70–99)
Glucose-Capillary: 206 mg/dL — ABNORMAL HIGH (ref 70–99)
Glucose-Capillary: 206 mg/dL — ABNORMAL HIGH (ref 70–99)
Glucose-Capillary: 210 mg/dL — ABNORMAL HIGH (ref 70–99)
Glucose-Capillary: 220 mg/dL — ABNORMAL HIGH (ref 70–99)
Glucose-Capillary: 247 mg/dL — ABNORMAL HIGH (ref 70–99)

## 2024-01-27 LAB — BASIC METABOLIC PANEL WITH GFR
Anion gap: 10 (ref 5–15)
BUN: 13 mg/dL (ref 8–23)
CO2: 21 mmol/L — ABNORMAL LOW (ref 22–32)
Calcium: 8.4 mg/dL — ABNORMAL LOW (ref 8.9–10.3)
Chloride: 110 mmol/L (ref 98–111)
Creatinine, Ser: 1.08 mg/dL — ABNORMAL HIGH (ref 0.44–1.00)
GFR, Estimated: 52 mL/min — ABNORMAL LOW (ref >60)
Glucose, Bld: 235 mg/dL — ABNORMAL HIGH (ref 70–99)
Potassium: 3.8 mmol/L (ref 3.5–5.1)
Sodium: 141 mmol/L (ref 135–145)

## 2024-01-27 LAB — URINALYSIS, ROUTINE W REFLEX MICROSCOPIC
Bacteria, UA: NONE SEEN
Bilirubin Urine: NEGATIVE
Glucose, UA: NEGATIVE mg/dL
Hgb urine dipstick: NEGATIVE
Ketones, ur: NEGATIVE mg/dL
Nitrite: NEGATIVE
Protein, ur: NEGATIVE mg/dL
Specific Gravity, Urine: 1.02 (ref 1.005–1.030)
pH: 5 (ref 5.0–8.0)

## 2024-01-27 LAB — MAGNESIUM: Magnesium: 3.3 mg/dL — ABNORMAL HIGH (ref 1.7–2.4)

## 2024-01-27 LAB — SURGICAL PCR SCREEN
MRSA, PCR: NEGATIVE
Staphylococcus aureus: NEGATIVE

## 2024-01-27 LAB — APTT: aPTT: 41 s — ABNORMAL HIGH (ref 24–36)

## 2024-01-27 LAB — VAS US DOPPLER PRE CABG: Right ABI: ABSENT

## 2024-01-27 LAB — HEMOGLOBIN AND HEMATOCRIT, BLOOD
HCT: 20.6 % — ABNORMAL LOW (ref 36.0–46.0)
Hemoglobin: 6.7 g/dL — CL (ref 12.0–15.0)

## 2024-01-27 LAB — PLATELET COUNT: Platelets: 161 10*3/uL (ref 150–400)

## 2024-01-27 LAB — PROTIME-INR
INR: 1.6 — ABNORMAL HIGH (ref 0.8–1.2)
Prothrombin Time: 19.7 s — ABNORMAL HIGH (ref 11.4–15.2)

## 2024-01-27 SURGERY — CORONARY ARTERY BYPASS GRAFTING (CABG)
Anesthesia: General | Site: Chest

## 2024-01-27 MED ORDER — ORAL CARE MOUTH RINSE: 15.0000 mL | OROMUCOSAL | Status: DC | PRN

## 2024-01-27 MED ORDER — ASPIRIN 325 MG PO TBEC
325.0000 mg | DELAYED_RELEASE_TABLET | Freq: Every day | ORAL | Status: DC
  Administered 2024-01-31 – 2024-02-05 (×6): 325 mg via ORAL
  Filled 2024-01-27 (×6): qty 1

## 2024-01-27 MED ORDER — HYDROXYZINE HCL 25 MG PO TABS: 25.0000 mg | ORAL_TABLET | Freq: Three times a day (TID) | ORAL | Status: DC | PRN

## 2024-01-27 MED ORDER — SODIUM BICARBONATE 8.4 % IV SOLN
50.0000 meq | Freq: Once | INTRAVENOUS | Status: AC
  Administered 2024-01-27: 50 meq via INTRAVENOUS

## 2024-01-27 MED ORDER — METHYLPREDNISOLONE SODIUM SUCC 40 MG IJ SOLR
40.0000 mg | Freq: Once | INTRAMUSCULAR | Status: AC
  Administered 2024-01-27: 40 mg via INTRAVENOUS
  Filled 2024-01-27: qty 1

## 2024-01-27 MED ORDER — SODIUM CHLORIDE 0.9% FLUSH: 3.0000 mL | INTRAVENOUS | Status: DC | PRN

## 2024-01-27 MED ORDER — BISACODYL 10 MG RE SUPP
10.0000 mg | Freq: Every day | RECTAL | Status: DC
  Administered 2024-01-28 – 2024-01-29 (×2): 10 mg via RECTAL
  Filled 2024-01-27 (×2): qty 1

## 2024-01-27 MED ORDER — DEXMEDETOMIDINE HCL IN NACL 400 MCG/100ML IV SOLN
0.0000 ug/kg/h | INTRAVENOUS | Status: DC
  Administered 2024-01-27: 0.4 ug/kg/h via INTRAVENOUS
  Administered 2024-01-28 (×2): 0.7 ug/kg/h via INTRAVENOUS
  Administered 2024-01-29: 0.6 ug/kg/h via INTRAVENOUS
  Administered 2024-01-29: 0.1 ug/kg/h via INTRAVENOUS
  Administered 2024-01-29: 0.7 ug/kg/h via INTRAVENOUS
  Filled 2024-01-27 (×6): qty 100

## 2024-01-27 MED ORDER — ASPIRIN 81 MG PO CHEW
324.0000 mg | CHEWABLE_TABLET | Freq: Once | ORAL | Status: AC
  Administered 2024-01-27: 324 mg via ORAL
  Filled 2024-01-27: qty 4

## 2024-01-27 MED ORDER — VANCOMYCIN HCL IN DEXTROSE 1-5 GM/200ML-% IV SOLN
1000.0000 mg | Freq: Once | INTRAVENOUS | Status: AC
  Administered 2024-01-27: 1000 mg via INTRAVENOUS
  Filled 2024-01-27: qty 200

## 2024-01-27 MED ORDER — TRAMADOL HCL 50 MG PO TABS: 50.0000 mg | ORAL_TABLET | ORAL | Status: DC | PRN

## 2024-01-27 MED ORDER — SODIUM CHLORIDE 0.9 % IV SOLN: INTRAVENOUS | Status: DC | PRN

## 2024-01-27 MED ORDER — ASPIRIN 81 MG PO CHEW
324.0000 mg | CHEWABLE_TABLET | Freq: Every day | ORAL | Status: DC
  Administered 2024-01-28 – 2024-01-30 (×3): 324 mg
  Filled 2024-01-27 (×3): qty 4

## 2024-01-27 MED ORDER — DOCUSATE SODIUM 50 MG/5ML PO LIQD
200.0000 mg | Freq: Every day | ORAL | Status: DC
  Administered 2024-01-28: 200 mg
  Filled 2024-01-27: qty 20

## 2024-01-27 MED ORDER — PROPOFOL 10 MG/ML IV BOLUS
INTRAVENOUS | Status: AC
  Filled 2024-01-27: qty 20

## 2024-01-27 MED ORDER — SODIUM CHLORIDE 0.9 % IV SOLN: INTRAVENOUS | Status: AC

## 2024-01-27 MED ORDER — SODIUM CHLORIDE 0.9 % IV SOLN: 10.0000 mL/h | Freq: Once | INTRAVENOUS | Status: DC

## 2024-01-27 MED ORDER — ORAL CARE MOUTH RINSE: 15.0000 mL | Freq: Once | OROMUCOSAL | Status: DC

## 2024-01-27 MED ORDER — MIDAZOLAM HCL 2 MG/2ML IJ SOLN
INTRAMUSCULAR | Status: AC
  Filled 2024-01-27: qty 2

## 2024-01-27 MED ORDER — NITROGLYCERIN IN D5W 200-5 MCG/ML-% IV SOLN
0.0000 ug/min | INTRAVENOUS | Status: DC
  Administered 2024-01-27: 5 ug/min via INTRAVENOUS

## 2024-01-27 MED ORDER — OXYCODONE HCL 5 MG PO TABS
5.0000 mg | ORAL_TABLET | ORAL | Status: DC | PRN
  Administered 2024-01-30: 5 mg
  Filled 2024-01-27: qty 1

## 2024-01-27 MED ORDER — DOCUSATE SODIUM 100 MG PO CAPS: 200.0000 mg | ORAL_CAPSULE | Freq: Every day | ORAL | Status: DC

## 2024-01-27 MED ORDER — CHLORHEXIDINE GLUCONATE 0.12 % MT SOLN: 15.0000 mL | Freq: Once | OROMUCOSAL | Status: DC

## 2024-01-27 MED ORDER — METOPROLOL TARTRATE 25 MG/10 ML ORAL SUSPENSION
12.5000 mg | Freq: Two times a day (BID) | ORAL | Status: DC
  Filled 2024-01-27 (×3): qty 10

## 2024-01-27 MED ORDER — ALPRAZOLAM 0.5 MG PO TABS: 0.5000 mg | ORAL_TABLET | Freq: Every day | ORAL | Status: DC | PRN

## 2024-01-27 MED ORDER — FENTANYL CITRATE (PF) 250 MCG/5ML IJ SOLN
INTRAMUSCULAR | Status: DC | PRN
  Administered 2024-01-27: 100 ug via INTRAVENOUS
  Administered 2024-01-27: 25 ug via INTRAVENOUS
  Administered 2024-01-27: 100 ug via INTRAVENOUS
  Administered 2024-01-27: 50 ug via INTRAVENOUS
  Administered 2024-01-27 (×2): 100 ug via INTRAVENOUS
  Administered 2024-01-27: 50 ug via INTRAVENOUS
  Administered 2024-01-27: 225 ug via INTRAVENOUS

## 2024-01-27 MED ORDER — SODIUM CHLORIDE 0.9% IV SOLUTION: Freq: Once | INTRAVENOUS | Status: DC

## 2024-01-27 MED ORDER — HEMOSTATIC AGENTS (NO CHARGE) OPTIME
TOPICAL | Status: DC | PRN
  Administered 2024-01-27 (×3): 1 via TOPICAL

## 2024-01-27 MED ORDER — METOPROLOL TARTRATE 5 MG/5ML IV SOLN: 2.5000 mg | INTRAVENOUS | Status: DC | PRN

## 2024-01-27 MED ORDER — FENTANYL CITRATE (PF) 250 MCG/5ML IJ SOLN
INTRAMUSCULAR | Status: AC
  Filled 2024-01-27: qty 5

## 2024-01-27 MED ORDER — ALBUMIN HUMAN 5 % IV SOLN
250.0000 mL | INTRAVENOUS | Status: AC | PRN
  Administered 2024-01-27 (×5): 12.5 g via INTRAVENOUS
  Filled 2024-01-27 (×3): qty 250

## 2024-01-27 MED ORDER — EPINEPHRINE HCL 5 MG/250ML IV SOLN IN NS
0.0000 ug/min | INTRAVENOUS | Status: DC
  Administered 2024-01-28: 10 ug/min via INTRAVENOUS
  Administered 2024-01-28: 12 ug/min via INTRAVENOUS
  Administered 2024-01-29: 2 ug/min via INTRAVENOUS
  Administered 2024-01-30: 4 ug/min via INTRAVENOUS
  Administered 2024-01-31: 6 ug/min via INTRAVENOUS
  Administered 2024-01-31: 11 ug/min via INTRAVENOUS
  Administered 2024-02-01 – 2024-02-02 (×2): 2 ug/min via INTRAVENOUS
  Filled 2024-01-27 (×8): qty 250

## 2024-01-27 MED ORDER — NOREPINEPHRINE 4 MG/250ML-% IV SOLN
0.0000 ug/min | INTRAVENOUS | Status: DC
  Administered 2024-01-28: 14 ug/min via INTRAVENOUS
  Administered 2024-01-28: 5 ug/min via INTRAVENOUS
  Administered 2024-01-28: 10 ug/min via INTRAVENOUS
  Administered 2024-01-28: 17 ug/min via INTRAVENOUS
  Administered 2024-01-29: 19 ug/min via INTRAVENOUS
  Administered 2024-01-29: 13 ug/min via INTRAVENOUS
  Administered 2024-01-29: 16 ug/min via INTRAVENOUS
  Administered 2024-01-29: 21 ug/min via INTRAVENOUS
  Filled 2024-01-27 (×7): qty 250

## 2024-01-27 MED ORDER — PHENYLEPHRINE HCL-NACL 20-0.9 MG/250ML-% IV SOLN: 0.0000 ug/min | INTRAVENOUS | Status: DC

## 2024-01-27 MED ORDER — ORAL CARE MOUTH RINSE
15.0000 mL | OROMUCOSAL | Status: DC
  Administered 2024-01-27 – 2024-01-30 (×32): 15 mL via OROMUCOSAL

## 2024-01-27 MED ORDER — PANTOPRAZOLE SODIUM 40 MG IV SOLR
40.0000 mg | Freq: Every day | INTRAVENOUS | Status: DC
  Administered 2024-01-27: 40 mg via INTRAVENOUS
  Filled 2024-01-27: qty 10

## 2024-01-27 MED ORDER — HYDROXYZINE HCL 10 MG PO TABS
10.0000 mg | ORAL_TABLET | Freq: Every day | ORAL | Status: DC
  Administered 2024-01-27 – 2024-01-30 (×4): 10 mg
  Filled 2024-01-27 (×4): qty 1

## 2024-01-27 MED ORDER — PROTAMINE SULFATE 10 MG/ML IV SOLN
INTRAVENOUS | Status: DC | PRN
  Administered 2024-01-27: 25 mg via INTRAVENOUS
  Administered 2024-01-27: 180 mg via INTRAVENOUS
  Administered 2024-01-27 (×2): 10 mg via INTRAVENOUS
  Administered 2024-01-27: 50 mg via INTRAVENOUS

## 2024-01-27 MED ORDER — SODIUM CHLORIDE (PF) 0.9 % IJ SOLN: OROMUCOSAL | Status: DC | PRN

## 2024-01-27 MED ORDER — LACTATED RINGERS IV SOLN: INTRAVENOUS | Status: DC | PRN

## 2024-01-27 MED ORDER — VASOPRESSIN 20 UNIT/ML IV SOLN
INTRAVENOUS | Status: AC
  Filled 2024-01-27: qty 1

## 2024-01-27 MED ORDER — METOCLOPRAMIDE HCL 5 MG/ML IJ SOLN
10.0000 mg | Freq: Four times a day (QID) | INTRAMUSCULAR | Status: AC
  Administered 2024-01-27 – 2024-01-28 (×6): 10 mg via INTRAVENOUS
  Filled 2024-01-27 (×6): qty 2

## 2024-01-27 MED ORDER — PANTOPRAZOLE SODIUM 40 MG PO TBEC: 40.0000 mg | DELAYED_RELEASE_TABLET | Freq: Every day | ORAL | Status: DC

## 2024-01-27 MED ORDER — ATORVASTATIN CALCIUM 80 MG PO TABS
80.0000 mg | ORAL_TABLET | Freq: Every day | ORAL | Status: DC
  Administered 2024-01-28 – 2024-01-30 (×3): 80 mg
  Filled 2024-01-27 (×3): qty 1

## 2024-01-27 MED ORDER — LACTATED RINGERS IV SOLN: INTRAVENOUS | Status: AC

## 2024-01-27 MED ORDER — MILRINONE LACTATE IN DEXTROSE 20-5 MG/100ML-% IV SOLN
0.3000 ug/kg/min | INTRAVENOUS | Status: DC
  Administered 2024-01-28: 0.3 ug/kg/min via INTRAVENOUS
  Filled 2024-01-27: qty 100

## 2024-01-27 MED ORDER — PANTOPRAZOLE SODIUM 40 MG IV SOLR
40.0000 mg | INTRAVENOUS | Status: DC
  Administered 2024-01-28 – 2024-01-30 (×3): 40 mg via INTRAVENOUS
  Filled 2024-01-27 (×4): qty 10

## 2024-01-27 MED ORDER — DEXTROSE 50 % IV SOLN
0.0000 mL | INTRAVENOUS | Status: DC | PRN
  Filled 2024-01-27: qty 50

## 2024-01-27 MED ORDER — CALCIUM CHLORIDE 10 % IV SOLN
INTRAVENOUS | Status: DC | PRN
  Administered 2024-01-27 (×3): 250 mg via INTRAVENOUS
  Administered 2024-01-27: 1000 mg via INTRAVENOUS
  Administered 2024-01-27 (×5): 250 mg via INTRAVENOUS

## 2024-01-27 MED ORDER — ACETAMINOPHEN 160 MG/5ML PO SOLN
1000.0000 mg | Freq: Four times a day (QID) | ORAL | Status: DC
  Administered 2024-01-27 – 2024-01-30 (×10): 1000 mg
  Filled 2024-01-27 (×10): qty 40.6

## 2024-01-27 MED ORDER — HEPARIN SODIUM (PORCINE) 1000 UNIT/ML IJ SOLN
INTRAMUSCULAR | Status: DC | PRN
  Administered 2024-01-27: 2000 [IU] via INTRAVENOUS
  Administered 2024-01-27: 18000 [IU] via INTRAVENOUS

## 2024-01-27 MED ORDER — MIDAZOLAM HCL 2 MG/2ML IJ SOLN
2.0000 mg | INTRAMUSCULAR | Status: DC | PRN
  Administered 2024-01-28 – 2024-01-29 (×4): 2 mg via INTRAVENOUS
  Filled 2024-01-27 (×5): qty 2

## 2024-01-27 MED ORDER — SODIUM BICARBONATE 8.4 % IV SOLN
INTRAVENOUS | Status: DC | PRN
  Administered 2024-01-27: 50 meq via INTRAVENOUS

## 2024-01-27 MED ORDER — ALBUMIN HUMAN 5 % IV SOLN: INTRAVENOUS | Status: DC | PRN

## 2024-01-27 MED ORDER — ONDANSETRON HCL 4 MG/2ML IJ SOLN
4.0000 mg | Freq: Four times a day (QID) | INTRAMUSCULAR | Status: DC | PRN
  Administered 2024-01-31 – 2024-02-01 (×2): 4 mg via INTRAVENOUS
  Filled 2024-01-27 (×2): qty 2

## 2024-01-27 MED ORDER — EPINEPHRINE 1 MG/10ML IJ SOSY
PREFILLED_SYRINGE | INTRAMUSCULAR | Status: DC | PRN
  Administered 2024-01-27 (×3): 10 ug via INTRAVENOUS

## 2024-01-27 MED ORDER — SODIUM CHLORIDE 0.45 % IV SOLN: INTRAVENOUS | Status: AC | PRN

## 2024-01-27 MED ORDER — CHLORHEXIDINE GLUCONATE 0.12 % MT SOLN
15.0000 mL | OROMUCOSAL | Status: AC
  Administered 2024-01-27: 15 mL via OROMUCOSAL
  Filled 2024-01-27: qty 15

## 2024-01-27 MED ORDER — PHENYLEPHRINE 80 MCG/ML (10ML) SYRINGE FOR IV PUSH (FOR BLOOD PRESSURE SUPPORT)
PREFILLED_SYRINGE | INTRAVENOUS | Status: DC | PRN
  Administered 2024-01-27: 160 ug via INTRAVENOUS
  Administered 2024-01-27: 80 ug via INTRAVENOUS
  Administered 2024-01-27: 160 ug via INTRAVENOUS
  Administered 2024-01-27: 80 ug via INTRAVENOUS
  Administered 2024-01-27 (×2): 160 ug via INTRAVENOUS
  Administered 2024-01-27: 40 ug via INTRAVENOUS
  Administered 2024-01-27: 160 ug via INTRAVENOUS
  Administered 2024-01-27 (×3): 80 ug via INTRAVENOUS
  Administered 2024-01-27: 160 ug via INTRAVENOUS
  Administered 2024-01-27: 80 ug via INTRAVENOUS
  Administered 2024-01-27 (×5): 160 ug via INTRAVENOUS
  Administered 2024-01-27: 80 ug via INTRAVENOUS
  Administered 2024-01-27: 40 ug via INTRAVENOUS
  Administered 2024-01-27: 160 ug via INTRAVENOUS
  Administered 2024-01-27: 40 ug via INTRAVENOUS

## 2024-01-27 MED ORDER — MIDAZOLAM HCL (PF) 5 MG/ML IJ SOLN
INTRAMUSCULAR | Status: DC | PRN
  Administered 2024-01-27: 2 mg via INTRAVENOUS

## 2024-01-27 MED ORDER — ALPRAZOLAM 0.5 MG PO TABS
1.0000 mg | ORAL_TABLET | Freq: Every day | ORAL | Status: DC
  Administered 2024-01-27 – 2024-01-30 (×4): 1 mg
  Filled 2024-01-27 (×5): qty 2

## 2024-01-27 MED ORDER — CHLORHEXIDINE GLUCONATE CLOTH 2 % EX PADS
6.0000 | MEDICATED_PAD | Freq: Every day | CUTANEOUS | Status: DC
  Administered 2024-01-27 – 2024-02-04 (×9): 6 via TOPICAL

## 2024-01-27 MED ORDER — ACETAMINOPHEN 500 MG PO TABS: 1000.0000 mg | ORAL_TABLET | Freq: Four times a day (QID) | ORAL | Status: DC

## 2024-01-27 MED ORDER — INSULIN REGULAR(HUMAN) IN NACL 100-0.9 UT/100ML-% IV SOLN
INTRAVENOUS | Status: DC
  Administered 2024-01-28: 8.5 [IU]/h via INTRAVENOUS
  Administered 2024-01-28: 7.5 [IU]/h via INTRAVENOUS
  Filled 2024-01-27 (×2): qty 100

## 2024-01-27 MED ORDER — SODIUM CHLORIDE 0.9 % IV SOLN: 250.0000 mL | INTRAVENOUS | Status: AC

## 2024-01-27 MED ORDER — PROPOFOL 10 MG/ML IV BOLUS
INTRAVENOUS | Status: DC | PRN
  Administered 2024-01-27: 60 mg via INTRAVENOUS

## 2024-01-27 MED ORDER — ROCURONIUM BROMIDE 10 MG/ML (PF) SYRINGE
PREFILLED_SYRINGE | INTRAVENOUS | Status: DC | PRN
  Administered 2024-01-27 (×2): 50 mg via INTRAVENOUS
  Administered 2024-01-27: 20 mg via INTRAVENOUS
  Administered 2024-01-27: 80 mg via INTRAVENOUS

## 2024-01-27 MED ORDER — SODIUM CHLORIDE 0.9% FLUSH
3.0000 mL | Freq: Two times a day (BID) | INTRAVENOUS | Status: DC
  Administered 2024-01-28 – 2024-01-30 (×6): 3 mL via INTRAVENOUS

## 2024-01-27 MED ORDER — PLASMA-LYTE A IV SOLN: INTRAVENOUS | Status: DC | PRN

## 2024-01-27 MED ORDER — LACTATED RINGERS IV SOLN: INTRAVENOUS | Status: DC

## 2024-01-27 MED ORDER — 0.9 % SODIUM CHLORIDE (POUR BTL) OPTIME
TOPICAL | Status: DC | PRN
Start: 2024-01-27 — End: 2024-01-27
  Administered 2024-01-27 (×2): 1000 mL
  Administered 2024-01-27: 5000 mL

## 2024-01-27 MED ORDER — POTASSIUM CHLORIDE 10 MEQ/50ML IV SOLN: 10.0000 meq | INTRAVENOUS | Status: AC

## 2024-01-27 MED ORDER — SODIUM CHLORIDE 0.9 % IV SOLN: 2.0000 g | INTRAVENOUS | Status: DC

## 2024-01-27 MED ORDER — BISACODYL 5 MG PO TBEC
10.0000 mg | DELAYED_RELEASE_TABLET | Freq: Every day | ORAL | Status: DC
  Administered 2024-02-02: 10 mg via ORAL
  Filled 2024-01-27 (×5): qty 2

## 2024-01-27 MED ORDER — OXYCODONE HCL 5 MG PO TABS: 5.0000 mg | ORAL_TABLET | ORAL | Status: DC | PRN

## 2024-01-27 MED ORDER — LEVOTHYROXINE SODIUM 88 MCG PO TABS
88.0000 ug | ORAL_TABLET | Freq: Every day | ORAL | Status: DC
  Administered 2024-01-28 – 2024-01-31 (×4): 88 ug
  Filled 2024-01-27 (×4): qty 1

## 2024-01-27 MED ORDER — ACETAMINOPHEN 160 MG/5ML PO SOLN
650.0000 mg | Freq: Once | ORAL | Status: AC
  Administered 2024-01-27: 650 mg
  Filled 2024-01-27: qty 20.3

## 2024-01-27 MED ORDER — HEMOSTATIC AGENTS (NO CHARGE) OPTIME
TOPICAL | Status: DC | PRN
  Administered 2024-01-27 (×2): 1 via TOPICAL

## 2024-01-27 MED ORDER — MAGNESIUM SULFATE 4 GM/100ML IV SOLN
4.0000 g | Freq: Once | INTRAVENOUS | Status: AC
  Administered 2024-01-27: 4 g via INTRAVENOUS
  Filled 2024-01-27: qty 100

## 2024-01-27 MED ORDER — MORPHINE SULFATE (PF) 2 MG/ML IV SOLN
1.0000 mg | INTRAVENOUS | Status: DC | PRN
  Administered 2024-01-27: 4 mg via INTRAVENOUS
  Administered 2024-01-28 – 2024-01-30 (×3): 2 mg via INTRAVENOUS
  Filled 2024-01-27: qty 2
  Filled 2024-01-27: qty 1
  Filled 2024-01-27: qty 2
  Filled 2024-01-27 (×2): qty 1

## 2024-01-27 MED ORDER — CEFAZOLIN SODIUM-DEXTROSE 2-4 GM/100ML-% IV SOLN
2.0000 g | Freq: Three times a day (TID) | INTRAVENOUS | Status: AC
  Administered 2024-01-27 – 2024-01-29 (×6): 2 g via INTRAVENOUS
  Filled 2024-01-27 (×7): qty 100

## 2024-01-27 MED ORDER — METOPROLOL TARTRATE 12.5 MG HALF TABLET: 12.5000 mg | ORAL_TABLET | Freq: Two times a day (BID) | ORAL | Status: DC

## 2024-01-27 MED FILL — Heparin Sodium (Porcine) Inj 1000 Unit/ML: Qty: 1000 | Status: AC

## 2024-01-27 MED FILL — Magnesium Sulfate Inj 50%: INTRAMUSCULAR | Qty: 10 | Status: AC

## 2024-01-27 MED FILL — Potassium Chloride Inj 2 mEq/ML: INTRAVENOUS | Qty: 40 | Status: AC

## 2024-01-27 SURGICAL SUPPLY — 89 items
ADAPTER MULTI PERFUSION 15 (ADAPTER) ×2 IMPLANT
BAG DECANTER FOR FLEXI CONT (MISCELLANEOUS) ×2 IMPLANT
BLADE CLIPPER SURG (BLADE) ×2 IMPLANT
BLADE STERNUM SYSTEM 6 (BLADE) ×2 IMPLANT
BLADE SURG 11 STRL SS (BLADE) IMPLANT
BNDG ELASTIC 4INX 5YD STR LF (GAUZE/BANDAGES/DRESSINGS) IMPLANT
BNDG ELASTIC 4X5.8 VLCR STR LF (GAUZE/BANDAGES/DRESSINGS) ×2 IMPLANT
BNDG ELASTIC 6INX 5YD STR LF (GAUZE/BANDAGES/DRESSINGS) ×2 IMPLANT
BNDG GAUZE DERMACEA FLUFF 4 (GAUZE/BANDAGES/DRESSINGS) ×2 IMPLANT
CANISTER SUCTION 3000ML PPV (SUCTIONS) ×2 IMPLANT
CANNULA AORTIC ROOT 9FR (CANNULA) ×2 IMPLANT
CANNULA EZ GLIDE AORTIC 21FR (CANNULA) ×2 IMPLANT
CANNULA MC2 2 STG 36/46 NON-V (CANNULA) IMPLANT
CANNULA VESSEL 3MM BLUNT TIP (CANNULA) ×6 IMPLANT
CATH ROBINSON RED A/P 18FR (CATHETERS) ×2 IMPLANT
CATH THORACIC 36FR (CATHETERS) ×2 IMPLANT
CATH THORACIC 36FR RT ANG (CATHETERS) ×2 IMPLANT
CLIP TI MEDIUM 24 (CLIP) IMPLANT
CLIP TI WIDE RED SMALL 24 (CLIP) IMPLANT
CONN ST 1/4X3/8 BEN (MISCELLANEOUS) IMPLANT
CONTAINER PROTECT SURGISLUSH (MISCELLANEOUS) ×4 IMPLANT
COUNTER NDL 20CT MAGNET RED (NEEDLE) IMPLANT
COVER MAYO STAND STRL (DRAPES) IMPLANT
DERMABOND ADVANCED .7 DNX6 (GAUZE/BANDAGES/DRESSINGS) IMPLANT
DRAIN CHANNEL 28F RND 3/8 FF (WOUND CARE) IMPLANT
DRAPE SRG 135X102X78XABS (DRAPES) ×2 IMPLANT
DRAPE WARM FLUID 44X44 (DRAPES) ×2 IMPLANT
DRSG AQUACEL AG ADV 3.5X14 (GAUZE/BANDAGES/DRESSINGS) IMPLANT
DRSG COVADERM 4X14 (GAUZE/BANDAGES/DRESSINGS) ×2 IMPLANT
ELECTRODE REM PT RTRN 9FT ADLT (ELECTROSURGICAL) ×4 IMPLANT
FELT TEFLON 1X6 (MISCELLANEOUS) ×4 IMPLANT
GAUZE SPONGE 4X4 12PLY STRL (GAUZE/BANDAGES/DRESSINGS) ×4 IMPLANT
GLOVE BIOGEL PI IND STRL 6 (GLOVE) IMPLANT
GLOVE BIOGEL PI MICRO STRL 7.5 (GLOVE) IMPLANT
GLOVE SKINSENSE STRL SZ7.0 (GLOVE) IMPLANT
GLOVE SS BIOGEL STRL SZ 7.5 (GLOVE) ×2 IMPLANT
GOWN STRL REUS W/ TWL LRG LVL3 (GOWN DISPOSABLE) ×8 IMPLANT
GOWN STRL REUS W/ TWL XL LVL3 (GOWN DISPOSABLE) ×4 IMPLANT
HEMOSTAT POWDER SURGIFOAM 1G (HEMOSTASIS) ×6 IMPLANT
HEMOSTAT SURGICEL 2X14 (HEMOSTASIS) ×2 IMPLANT
INSERT FOGARTY XLG (MISCELLANEOUS) IMPLANT
KIT BASIN OR (CUSTOM PROCEDURE TRAY) ×2 IMPLANT
KIT SUCTION CATH 14FR (SUCTIONS) ×4 IMPLANT
KIT TURNOVER KIT B (KITS) ×2 IMPLANT
KIT VASOVIEW HEMOPRO 2 VH 4000 (KITS) ×2 IMPLANT
MARKER GRAFT CORONARY BYPASS (MISCELLANEOUS) ×6 IMPLANT
NS IRRIG 1000ML POUR BTL (IV SOLUTION) ×10 IMPLANT
PACK E OPEN HEART (SUTURE) ×2 IMPLANT
PACK OPEN HEART (CUSTOM PROCEDURE TRAY) ×2 IMPLANT
PAD ARMBOARD POSITIONER FOAM (MISCELLANEOUS) ×4 IMPLANT
PAD ELECT DEFIB RADIOL ZOLL (MISCELLANEOUS) ×2 IMPLANT
PENCIL BUTTON HOLSTER BLD 10FT (ELECTRODE) ×2 IMPLANT
POSITIONER HEAD DONUT 9IN (MISCELLANEOUS) ×2 IMPLANT
PUNCH AORTIC ROTATE 4.0MM (MISCELLANEOUS) IMPLANT
PUNCH AORTIC ROTATE 4.5MM 8IN (MISCELLANEOUS) IMPLANT
PUNCH AORTIC ROTATE 5MM 8IN (MISCELLANEOUS) IMPLANT
SEALANT PATCH FIBRIN 2X4IN (MISCELLANEOUS) IMPLANT
SET MPS 3-ND DEL (MISCELLANEOUS) IMPLANT
SOLUTION ANTFG W/FOAM PAD STRL (MISCELLANEOUS) IMPLANT
SPONGE T-LAP 18X18 ~~LOC~~+RFID (SPONGE) IMPLANT
STOPCOCK 4 WAY LG BORE MALE ST (IV SETS) IMPLANT
SUPPORT HEART JANKE-BARRON (MISCELLANEOUS) ×2 IMPLANT
SUT BONE WAX W31G (SUTURE) ×2 IMPLANT
SUT ETHIBOND 2 0 SH 36X2 (SUTURE) IMPLANT
SUT MNCRL AB 4-0 PS2 18 (SUTURE) IMPLANT
SUT PROLENE 3 0 SH DA (SUTURE) ×2 IMPLANT
SUT PROLENE 4 0 SH DA (SUTURE) IMPLANT
SUT PROLENE 4-0 RB1 .5 CRCL 36 (SUTURE) IMPLANT
SUT PROLENE 6 0 C 1 30 (SUTURE) ×4 IMPLANT
SUT PROLENE 7 0 BV 1 (SUTURE) IMPLANT
SUT PROLENE 7 0 BV1 MDA (SUTURE) ×2 IMPLANT
SUT PROLENE 8 0 BV175 6 (SUTURE) IMPLANT
SUT STEEL 6MS V (SUTURE) ×2 IMPLANT
SUT STEEL STERNAL CCS#1 18IN (SUTURE) IMPLANT
SUT STEEL SZ 6 DBL 3X14 BALL (SUTURE) ×2 IMPLANT
SUT VIC AB 1 CTX36XBRD ANBCTR (SUTURE) ×4 IMPLANT
SUT VIC AB 2-0 CT1 TAPERPNT 27 (SUTURE) IMPLANT
SUT VIC AB 2-0 CTX 27 (SUTURE) IMPLANT
SUT VIC AB 3-0 SH 27X BRD (SUTURE) IMPLANT
SYSTEM SAHARA CHEST DRAIN ATS (WOUND CARE) ×2 IMPLANT
TAPE CLOTH SURG 4X10 WHT LF (GAUZE/BANDAGES/DRESSINGS) IMPLANT
TAPE PAPER 2X10 WHT MICROPORE (GAUZE/BANDAGES/DRESSINGS) IMPLANT
TOWEL GREEN STERILE (TOWEL DISPOSABLE) ×2 IMPLANT
TOWEL GREEN STERILE FF (TOWEL DISPOSABLE) ×2 IMPLANT
TUBE SUCT INTRACARD DLP 20F (MISCELLANEOUS) ×2 IMPLANT
TUBE SUCTION CARDIAC 10FR (CANNULA) ×2 IMPLANT
TUBING LAP HI FLOW INSUFFLATIO (TUBING) ×2 IMPLANT
UNDERPAD 30X36 HEAVY ABSORB (UNDERPADS AND DIAPERS) ×2 IMPLANT
WATER STERILE IRR 1000ML POUR (IV SOLUTION) ×4 IMPLANT

## 2024-01-27 NOTE — Hospital Course (Addendum)
 History of Present Illness:  Kaitlyn Wright is an 78 yo female with known history of Hypothyroidism and anxiety/depression.  She presented to the Emergency Department on 5/5 with complaints of chest pain that was severe and awoke her from sleep.  She took Tylenol , ASA, and Alprazolam  without relief of symptoms.  She did admit to experiencing similar symptoms several weeks ago that did resolve and she had not experienced since.  Workup in the ED consisted of CXR which showed T wave inversion w/o evidence of ST changes.  Troponin levels were elevated.  She ultimately was chest pain free at time of admission to the hospital.  She was evaluated by Cardiology who recommended initiation of IV Heparin , Echocardiogram, and cardiac catheterization.  The patient was agreeable to proceed and she was transferred to [facility] for further workup.    Hospital Course:  She underwent catheterization by Dr. Berry Bristol on 5/6 which revealed multivessel CAD.  LAD showed several areas throughout the vessel that showed disease.  There was some concern this may not be bypassable, but he felt cardiothoracic surgery consultation was indicated.  The patient was evaluated by Dr. Luna Salinas at which time she remained chest pain free.  He was in agreement she would benefit from coronary bypass grafting.  The risks and benefits of the procedure were explained to the patient and she was agreeable to proceed.  She was taken to the operating room on 01/27/2024.  She underwent CABG x 4 utilizing LIMA to LAD, SVG to PDA and SVG to Ramus sequential to Diagonal as well as endoscopic harvest of the right greater saphenous vein. She tolerated the procedure well and was transferred to the SICU in stable condition.  On postop day 1, she was still requiring vasopressor and inotropic support.  She was given albumin  for low filling pressures.  She continued to require ventilator support.  The critical care medicine team was consulted.  The patient was  felt to have pulmonary edema contributing to ongoing respiratory insufficiency.  She was routinely diuresed with good response. She was extubated without complication on 05/12.  She was weaned off Milrinone  and Neosynephrine, but continued to require Epinephrine .  She was in NSR and transitioned to oral Amiodarone .  PT/OT consult were obtained and they recommended inpatient rehab consult.  She developed hypotension overnight and required pressor support with resumption of Epi and Norepi.  She was started on Midodrine  to help further facilitate wean.  Echocardiogram was obtained and showed preserved LV function.  Her foley catheter was removed without difficulty.  The patient was passing a large amount of water but not stool overall.  KUB was obtained and showed no evidence of constipation.  She continued to require support with Epi.  This was later weaned on 02/03/2024.  She developed complaints of a sore throat.  Exam showed some red petechiae and ulceration/plague.  She was started on Magic mouthwash and Cepacol prn.  IV Lasix  was started by AHF to facilitate diuresis. She remained in sinus rhythm and Amiodarone  was decreased to 200 mg bid on 05/17. Accu checks and SS PRN were stopped on 05/18 as she has no history of diabetes (pre op HGA1C 5.6). Plavix  was started for hx of NSTEMI. She had a left pleural effusion, thoracentesis yielded 50cc of serous drainage. She then developed a post thoracentesis pneumothorax, IR recommended chest tube placement but the patient refused. Follow up CXR showed an improved pneumothorax and the patient remained stable without respiratory distress. She completed a 3  day course of Bactrim for UTI. She developed worsening leukocytosis and infiltrate on CXR, IV zosyn was started. Her blood pressure improved and she was started on low dose Lopressor .

## 2024-01-27 NOTE — Interval H&P Note (Signed)
 History and Physical Interval Note:  01/27/2024 7:14 AM  Kaitlyn Wright  has presented today for surgery, with the diagnosis of CAD.  The various methods of treatment have been discussed with the patient and family. After consideration of risks, benefits and other options for treatment, the patient has consented to  Procedure(s): CORONARY ARTERY BYPASS GRAFTING (CABG) (N/A) ECHOCARDIOGRAM, TRANSESOPHAGEAL (N/A) as a surgical intervention.  The patient's history has been reviewed, patient examined, no change in status, stable for surgery.  I have reviewed the patient's chart and labs.  Questions were answered to the patient's satisfaction.     Zelphia Higashi

## 2024-01-27 NOTE — Brief Op Note (Addendum)
 01/23/2024 - 01/27/2024  4:38 PM  PATIENT:  Kaitlyn Wright  78 y.o. female  PRE-OPERATIVE DIAGNOSIS:  CORONARY ARTERY DISEASE  POST-OPERATIVE DIAGNOSIS:  CORONARY ARTERY DISEASE  PROCEDURE:  CORONARY ARTERY BYPASS GRAFTING X 4, USING LEFT INTERNAL MAMMARY ARTERY AND RIGHT ENDOSCOPIC HARVESTED GREATER SAPHENOUS VEIN  ECHOCARDIOGRAM, TRANSESOPHAGEAL  Vein harvest time: Vein prep time: -LIMA to LAD -SVG to PDA -SVG to Ramus sequential to Diagonal  SURGEON:  Surgeons and Role:    Kaitlyn Higashi, MD - Primary  PHYSICIAN ASSISTANT: Kaitlyn Fanning PA-C  ASSISTANTS: Kaitlyn Wright RNFA   ANESTHESIA:   general  EBL:  450 mL   BLOOD ADMINISTERED: 2U PRBCs  DRAINS: Mediastinal and pleural drains   LOCAL MEDICATIONS USED:  NONE  SPECIMEN:  No Specimen  DISPOSITION OF SPECIMEN:  N/A  COUNTS:  YES  DICTATION: .Dragon Dictation  PLAN OF CARE: Admit to inpatient   PATIENT DISPOSITION:  ICU - intubated and hemodynamically stable.   Delay start of Pharmacological VTE agent (>24hrs) due to surgical blood loss or risk of bleeding: yes  Sutures tore through right atrium after removing cannula- massive bleeding requiring going back on pump to repair  Kaitlyn Pinion C. Luna Salinas, MD [facility] [phone]

## 2024-01-27 NOTE — Progress Notes (Signed)
      [address]       [place] [address]             [phone]      S/p CABG x 4  Intubated, sedated, has followed commands Facial and tongue swelling BP (!) 144/68   Pulse 92   Temp (!) 97.5 F (36.4 C)   Resp 16   Ht 5\' 3"  (1.6 m)   Wt 57.3 kg   SpO2 97%   BMI 22.38 kg/m  CI 2.24 on milrinone  0.3, epi 3, norepi 1 Co-ox 70  Intake/Output Summary (Last 24 hours) at 01/27/2024 2025 Last data filed at 01/27/2024 2000 Gross per 24 hour  Intake 8464.43 ml  Output 2905 ml  Net 5559.43 ml   ~ 500 ml from CT since OR  Hct 32 PLT 43K- transfused CBG markedly elevated insulin  drip at 11  Will attempt to wean monitor swelling  [name], MD [facility] [phone]

## 2024-01-27 NOTE — Anesthesia Procedure Notes (Signed)
 Arterial Line Insertion Start/End5/05/2024 7:30 AM, 01/27/2024 7:30 AM Performed by: Hebert Littler, CRNA, CRNA  Patient location: Pre-op. Preanesthetic checklist: patient identified, IV checked, site marked, risks and benefits discussed, surgical consent, monitors and equipment checked, pre-op evaluation and timeout performed Lidocaine  1% used for infiltration Left, radial was placed Catheter size: 22 G Hand hygiene performed , maximum sterile barriers used  and Seldinger technique used Allen's test indicative of satisfactory collateral circulation Attempts: 1 Procedure performed without using ultrasound guided technique. Following insertion, dressing applied and Biopatch. Post procedure assessment: normal  Patient tolerated the procedure well with no immediate complications.

## 2024-01-27 NOTE — Transfer of Care (Signed)
 Immediate Anesthesia Transfer of Care Note  Patient: Kaitlyn Wright  Procedure(s) Performed: CORONARY ARTERY BYPASS GRAFTING X 4, USING LEFT INTERNAL MAMMARY ARTERY AND RIGHT ENDOSCOPIC HARVESTED GREATER SAPHENOUS VEIN (Chest) ECHOCARDIOGRAM, TRANSESOPHAGEAL  Patient Location: ICU  Anesthesia Type:General  Level of Consciousness: sedated and Patient remains intubated per anesthesia plan  Airway & Oxygen Therapy: Patient remains intubated per anesthesia plan and Patient placed on Ventilator (see vital sign flow sheet for setting)  Post-op Assessment: Report given to RN and Post -op Vital signs reviewed and stable  Post vital signs: Reviewed and stable  Last Vitals:  Vitals Value Taken Time  BP 132/71 01/27/24   1411  Temp 35.4 C 01/27/24 1411  Pulse 80 01/27/24 1411  Resp 16 01/27/24 1411  SpO2 100 % 01/27/24 1411  Vitals shown include unfiled device data.  Last Pain:  Vitals:   01/27/24 0701  TempSrc:   PainSc: 0-No pain         Complications: No notable events documented.

## 2024-01-27 NOTE — Progress Notes (Signed)
 Patient began to have chest tightness after admin pre op medication. Patient and patients daughter believe that it was the Valium  that was given. Patient also had several bowel movements prior to the administration of the pre op medications.  Report was given to the pre-op nurse. Patients daughter Concha Deed has all of the patients belongings.

## 2024-01-27 NOTE — Anesthesia Procedure Notes (Signed)
 Procedure Name: Intubation Date/Time: 01/27/2024 8:00 AM  Performed by: Hebert Littler, CRNAPre-anesthesia Checklist: Patient identified, Emergency Drugs available, Suction available, Patient being monitored and Timeout performed Patient Re-evaluated:Patient Re-evaluated prior to induction Oxygen Delivery Method: Circle system utilized Preoxygenation: Pre-oxygenation with 100% oxygen Induction Type: IV induction Laryngoscope Size: Glidescope and 3 Grade View: Grade I Tube size: 8.0 mm Number of attempts: 1 Airway Equipment and Method: Patient positioned with wedge pillow, Video-laryngoscopy and Stylet Placement Confirmation: ETT inserted through vocal cords under direct vision, positive ETCO2, CO2 detector and breath sounds checked- equal and bilateral Secured at: 22 cm Tube secured with: Tape Comments: Grade lll view with Mac 4. Grade 1 view with Glidescope 3.

## 2024-01-27 NOTE — Anesthesia Procedure Notes (Signed)
 Central Venous Catheter Insertion Performed by: Erin Havers, MD, anesthesiologist Start/End5/05/2024 6:55 AM, 01/27/2024 7:10 AM Patient location: Pre-op. Preanesthetic checklist: patient identified, IV checked, site marked, risks and benefits discussed, surgical consent, monitors and equipment checked, pre-op evaluation, timeout performed and anesthesia consent Position: Trendelenburg Lidocaine  1% used for infiltration and patient sedated Hand hygiene performed , maximum sterile barriers used  and Seldinger technique used Catheter size: 9 Fr Central line was placed.MAC introducer Procedure performed using ultrasound guided technique. Ultrasound Notes:anatomy identified, needle tip was noted to be adjacent to the nerve/plexus identified, no ultrasound evidence of intravascular and/or intraneural injection and image(s) printed for medical record Attempts: 3 Following insertion, line sutured, dressing applied and Biopatch. Post procedure assessment: free fluid flow, blood return through all ports and no air  Patient tolerated the procedure well with no immediate complications. Additional procedure comments: Multiple attempts with wire unable to advance beyond 15cm.  New stick and new wire with success.Aaron Aas

## 2024-01-27 NOTE — Progress Notes (Signed)
 Patient presented with a non-STEMI, underwent cardiac cath with recommendations for CT surgical consultation.  Patient seen by CT surgery underwent CABG this morning.  Patient transferred to cardiothoracic team as primary.  Nothing further to add.  Will sign off.  No charge.

## 2024-01-27 NOTE — Plan of Care (Signed)
  Problem: Education: Goal: Understanding of CV disease, CV risk reduction, and recovery process will improve Outcome: Progressing   Problem: Cardiovascular: Goal: Ability to achieve and maintain adequate cardiovascular perfusion will improve Outcome: Progressing   Problem: Coping: Goal: Level of anxiety will decrease Outcome: Progressing   Problem: Safety: Goal: Ability to remain free from injury will improve Outcome: Progressing

## 2024-01-27 NOTE — Anesthesia Procedure Notes (Signed)
 Central Venous Catheter Insertion Performed by: Erin Havers, MD, anesthesiologist Start/End5/05/2024 7:10 AM, 01/27/2024 7:15 AM Patient location: Pre-op. Preanesthetic checklist: patient identified, IV checked, site marked, risks and benefits discussed, surgical consent, monitors and equipment checked, pre-op evaluation and timeout performed Position: Trendelenburg Hand hygiene performed  and maximum sterile barriers used  Total catheter length 100. PA cath was placed.Swan type:thermodilution PA Cath depth:48 Procedure performed without using ultrasound guided technique. Attempts: 1 Patient tolerated the procedure well with no immediate complications.

## 2024-01-28 ENCOUNTER — Inpatient Hospital Stay (HOSPITAL_COMMUNITY)

## 2024-01-28 DIAGNOSIS — I214 Non-ST elevation (NSTEMI) myocardial infarction: Secondary | ICD-10-CM | POA: Diagnosis not present

## 2024-01-28 DIAGNOSIS — R739 Hyperglycemia, unspecified: Secondary | ICD-10-CM | POA: Diagnosis not present

## 2024-01-28 DIAGNOSIS — J9601 Acute respiratory failure with hypoxia: Secondary | ICD-10-CM | POA: Diagnosis not present

## 2024-01-28 DIAGNOSIS — D72829 Elevated white blood cell count, unspecified: Secondary | ICD-10-CM

## 2024-01-28 DIAGNOSIS — R570 Cardiogenic shock: Secondary | ICD-10-CM | POA: Diagnosis not present

## 2024-01-28 LAB — BASIC METABOLIC PANEL WITH GFR
Anion gap: 14 (ref 5–15)
Anion gap: 13 (ref 5–15)
Anion gap: 8 (ref 5–15)
BUN: 14 mg/dL (ref 8–23)
BUN: 14 mg/dL (ref 8–23)
BUN: 15 mg/dL (ref 8–23)
CO2: 18 mmol/L — ABNORMAL LOW (ref 22–32)
CO2: 19 mmol/L — ABNORMAL LOW (ref 22–32)
CO2: 20 mmol/L — ABNORMAL LOW (ref 22–32)
Calcium: 8.5 mg/dL — ABNORMAL LOW (ref 8.9–10.3)
Calcium: 8.4 mg/dL — ABNORMAL LOW (ref 8.9–10.3)
Calcium: 7.8 mg/dL — ABNORMAL LOW (ref 8.9–10.3)
Chloride: 108 mmol/L (ref 98–111)
Chloride: 109 mmol/L (ref 98–111)
Chloride: 110 mmol/L (ref 98–111)
Creatinine, Ser: 1.2 mg/dL — ABNORMAL HIGH (ref 0.44–1.00)
Creatinine, Ser: 1.12 mg/dL — ABNORMAL HIGH (ref 0.44–1.00)
Creatinine, Ser: 0.93 mg/dL (ref 0.44–1.00)
GFR, Estimated: 46 mL/min — ABNORMAL LOW (ref >60)
GFR, Estimated: 50 mL/min — ABNORMAL LOW (ref >60)
GFR, Estimated: >60 mL/min (ref >60)
Glucose, Bld: 208 mg/dL — ABNORMAL HIGH (ref 70–99)
Glucose, Bld: 163 mg/dL — ABNORMAL HIGH (ref 70–99)
Glucose, Bld: 160 mg/dL — ABNORMAL HIGH (ref 70–99)
Potassium: 3.2 mmol/L — ABNORMAL LOW (ref 3.5–5.1)
Potassium: 3.4 mmol/L — ABNORMAL LOW (ref 3.5–5.1)
Potassium: 4 mmol/L (ref 3.5–5.1)
Sodium: 140 mmol/L (ref 135–145)
Sodium: 141 mmol/L (ref 135–145)
Sodium: 138 mmol/L (ref 135–145)

## 2024-01-28 LAB — CBC
HCT: 31.1 % — ABNORMAL LOW (ref 36.0–46.0)
HCT: 29.1 % — ABNORMAL LOW (ref 36.0–46.0)
Hemoglobin: 10.6 g/dL — ABNORMAL LOW (ref 12.0–15.0)
Hemoglobin: 9.9 g/dL — ABNORMAL LOW (ref 12.0–15.0)
MCH: 31.5 pg (ref 26.0–34.0)
MCH: 31.1 pg (ref 26.0–34.0)
MCHC: 34.1 g/dL (ref 30.0–36.0)
MCHC: 34 g/dL (ref 30.0–36.0)
MCV: 92.3 fL (ref 80.0–100.0)
MCV: 91.5 fL (ref 80.0–100.0)
Platelets: 157 10*3/uL (ref 150–400)
Platelets: 112 10*3/uL — ABNORMAL LOW (ref 150–400)
RBC: 3.37 MIL/uL — ABNORMAL LOW (ref 3.87–5.11)
RBC: 3.18 MIL/uL — ABNORMAL LOW (ref 3.87–5.11)
RDW: 15.2 % (ref 11.5–15.5)
RDW: 14.9 % (ref 11.5–15.5)
WBC: 11.7 10*3/uL — ABNORMAL HIGH (ref 4.0–10.5)
WBC: 19.8 10*3/uL — ABNORMAL HIGH (ref 4.0–10.5)
nRBC: 0 % (ref 0.0–0.2)
nRBC: 0 % (ref 0.0–0.2)

## 2024-01-28 LAB — BPAM FFP
Blood Product Expiration Date: 202505142359
Blood Product Expiration Date: 202505142359
Blood Product Expiration Date: 202505142359
Blood Product Expiration Date: 202505142359
ISSUE DATE / TIME: 202505091227
ISSUE DATE / TIME: 202505091227
ISSUE DATE / TIME: 202505101205
ISSUE DATE / TIME: 202505101205
Unit Type and Rh: 2800
Unit Type and Rh: 8400
Unit Type and Rh: 8400
Unit Type and Rh: 8400

## 2024-01-28 LAB — URINE CULTURE: Culture: NO GROWTH

## 2024-01-28 LAB — PREPARE FRESH FROZEN PLASMA
Unit division: 0
Unit division: 0
Unit division: 0
Unit division: 0

## 2024-01-28 LAB — GLUCOSE, CAPILLARY
Glucose-Capillary: 205 mg/dL — ABNORMAL HIGH (ref 70–99)
Glucose-Capillary: 207 mg/dL — ABNORMAL HIGH (ref 70–99)
Glucose-Capillary: 215 mg/dL — ABNORMAL HIGH (ref 70–99)
Glucose-Capillary: 170 mg/dL — ABNORMAL HIGH (ref 70–99)
Glucose-Capillary: 196 mg/dL — ABNORMAL HIGH (ref 70–99)
Glucose-Capillary: 188 mg/dL — ABNORMAL HIGH (ref 70–99)
Glucose-Capillary: 134 mg/dL — ABNORMAL HIGH (ref 70–99)
Glucose-Capillary: 112 mg/dL — ABNORMAL HIGH (ref 70–99)
Glucose-Capillary: 107 mg/dL — ABNORMAL HIGH (ref 70–99)
Glucose-Capillary: 78 mg/dL (ref 70–99)
Glucose-Capillary: 81 mg/dL (ref 70–99)
Glucose-Capillary: 90 mg/dL (ref 70–99)
Glucose-Capillary: 150 mg/dL — ABNORMAL HIGH (ref 70–99)
Glucose-Capillary: 127 mg/dL — ABNORMAL HIGH (ref 70–99)
Glucose-Capillary: 120 mg/dL — ABNORMAL HIGH (ref 70–99)
Glucose-Capillary: 68 mg/dL — ABNORMAL LOW (ref 70–99)
Glucose-Capillary: 131 mg/dL — ABNORMAL HIGH (ref 70–99)
Glucose-Capillary: 104 mg/dL — ABNORMAL HIGH (ref 70–99)

## 2024-01-28 LAB — POCT I-STAT 7, (LYTES, BLD GAS, ICA,H+H)
Acid-base deficit: 5 mmol/L — ABNORMAL HIGH (ref 0.0–2.0)
Acid-base deficit: 7 mmol/L — ABNORMAL HIGH (ref 0.0–2.0)
Bicarbonate: 18 mmol/L — ABNORMAL LOW (ref 20.0–28.0)
Bicarbonate: 20.5 mmol/L (ref 20.0–28.0)
Calcium, Ion: 1.16 mmol/L (ref 1.15–1.40)
Calcium, Ion: 1.24 mmol/L (ref 1.15–1.40)
HCT: 25 % — ABNORMAL LOW (ref 36.0–46.0)
HCT: 28 % — ABNORMAL LOW (ref 36.0–46.0)
Hemoglobin: 8.5 g/dL — ABNORMAL LOW (ref 12.0–15.0)
Hemoglobin: 9.5 g/dL — ABNORMAL LOW (ref 12.0–15.0)
O2 Saturation: 95 %
O2 Saturation: 96 %
Patient temperature: 36.2
Patient temperature: 36.8
Potassium: 3.1 mmol/L — ABNORMAL LOW (ref 3.5–5.1)
Potassium: 3.2 mmol/L — ABNORMAL LOW (ref 3.5–5.1)
Sodium: 141 mmol/L (ref 135–145)
Sodium: 144 mmol/L (ref 135–145)
TCO2: 19 mmol/L — ABNORMAL LOW (ref 22–32)
TCO2: 22 mmol/L (ref 22–32)
pCO2 arterial: 33.1 mmHg (ref 32–48)
pCO2 arterial: 39 mmHg (ref 32–48)
pH, Arterial: 7.327 — ABNORMAL LOW (ref 7.35–7.45)
pH, Arterial: 7.34 — ABNORMAL LOW (ref 7.35–7.45)
pO2, Arterial: 74 mmHg — ABNORMAL LOW (ref 83–108)
pO2, Arterial: 89 mmHg (ref 83–108)

## 2024-01-28 LAB — BPAM PLATELET PHERESIS
Blood Product Expiration Date: 202505112359
ISSUE DATE / TIME: 202505091543
Unit Type and Rh: 5100

## 2024-01-28 LAB — ECHO INTRAOPERATIVE TEE
Height: 63 in
Weight: 2021.18 [oz_av]

## 2024-01-28 LAB — MAGNESIUM
Magnesium: 2.6 mg/dL — ABNORMAL HIGH (ref 1.7–2.4)
Magnesium: 2.2 mg/dL (ref 1.7–2.4)

## 2024-01-28 LAB — PREPARE PLATELET PHERESIS: Unit division: 0

## 2024-01-28 LAB — COOXEMETRY PANEL
Carboxyhemoglobin: 1.8 % — ABNORMAL HIGH (ref 0.5–1.5)
Methemoglobin: 0.7 % (ref 0.0–1.5)
O2 Saturation: 84.6 %
Total hemoglobin: 9.5 g/dL — ABNORMAL LOW (ref 12.0–16.0)

## 2024-01-28 LAB — PREPARE RBC (CROSSMATCH)

## 2024-01-28 MED ORDER — DEXTROSE 50 % IV SOLN
INTRAVENOUS | Status: AC
Start: 2024-01-28 — End: 2024-01-28
  Administered 2024-01-28: 20 mL via INTRAVENOUS
  Filled 2024-01-28: qty 50

## 2024-01-28 MED ORDER — CALCIUM GLUCONATE-NACL 1-0.675 GM/50ML-% IV SOLN
INTRAVENOUS | Status: AC
  Filled 2024-01-28: qty 50

## 2024-01-28 MED ORDER — SODIUM CHLORIDE 0.9% IV SOLUTION: Freq: Once | INTRAVENOUS | Status: AC

## 2024-01-28 MED ORDER — ALBUMIN HUMAN 5 % IV SOLN
12.5000 g | Freq: Once | INTRAVENOUS | Status: AC
  Administered 2024-01-28: 12.5 g via INTRAVENOUS

## 2024-01-28 MED ORDER — SODIUM BICARBONATE 8.4 % IV SOLN
INTRAVENOUS | Status: AC
  Filled 2024-01-28: qty 100

## 2024-01-28 MED ORDER — MILRINONE LACTATE IN DEXTROSE 20-5 MG/100ML-% IV SOLN
0.1250 ug/kg/min | INTRAVENOUS | Status: DC
  Administered 2024-01-28 – 2024-01-29 (×2): 0.125 ug/kg/min via INTRAVENOUS
  Filled 2024-01-28: qty 100

## 2024-01-28 MED ORDER — POTASSIUM CHLORIDE 10 MEQ/50ML IV SOLN
10.0000 meq | INTRAVENOUS | Status: AC
  Administered 2024-01-28 (×3): 10 meq via INTRAVENOUS

## 2024-01-28 MED ORDER — FENTANYL CITRATE PF 50 MCG/ML IJ SOSY: 50.0000 ug | PREFILLED_SYRINGE | INTRAMUSCULAR | Status: DC | PRN

## 2024-01-28 MED ORDER — DOCUSATE SODIUM 50 MG/5ML PO LIQD
100.0000 mg | Freq: Two times a day (BID) | ORAL | Status: DC
  Administered 2024-01-28 – 2024-01-29 (×3): 100 mg
  Filled 2024-01-28 (×3): qty 10

## 2024-01-28 MED ORDER — AMIODARONE HCL IN DEXTROSE 360-4.14 MG/200ML-% IV SOLN
30.0000 mg/h | INTRAVENOUS | Status: DC
  Administered 2024-01-29 – 2024-01-31 (×4): 30 mg/h via INTRAVENOUS
  Filled 2024-01-28 (×3): qty 200
  Filled 2024-01-28: qty 400

## 2024-01-28 MED ORDER — DEXTROSE 50 % IV SOLN: 20.0000 mL | Freq: Once | INTRAVENOUS | Status: AC

## 2024-01-28 MED ORDER — ALBUMIN HUMAN 5 % IV SOLN
12.5000 g | Freq: Once | INTRAVENOUS | Status: AC
  Administered 2024-01-28: 12.5 g via INTRAVENOUS
  Filled 2024-01-28: qty 250

## 2024-01-28 MED ORDER — POTASSIUM CHLORIDE 10 MEQ/50ML IV SOLN
10.0000 meq | INTRAVENOUS | Status: AC
  Administered 2024-01-28 (×2): 10 meq via INTRAVENOUS
  Filled 2024-01-28 (×2): qty 50

## 2024-01-28 MED ORDER — DEXTROSE 50 % IV SOLN
35.0000 mL | Freq: Once | INTRAVENOUS | Status: AC
  Administered 2024-01-28: 35 mL via INTRAVENOUS

## 2024-01-28 MED ORDER — FENTANYL CITRATE PF 50 MCG/ML IJ SOSY
50.0000 ug | PREFILLED_SYRINGE | INTRAMUSCULAR | Status: AC | PRN
  Administered 2024-01-29 (×3): 50 ug via INTRAVENOUS

## 2024-01-28 MED ORDER — FUROSEMIDE 10 MG/ML IJ SOLN
60.0000 mg | Freq: Once | INTRAMUSCULAR | Status: AC
  Administered 2024-01-28: 60 mg via INTRAVENOUS
  Filled 2024-01-28: qty 6

## 2024-01-28 MED ORDER — SODIUM CHLORIDE 3 % IN NEBU
4.0000 mL | INHALATION_SOLUTION | RESPIRATORY_TRACT | Status: DC
  Administered 2024-01-28 – 2024-01-30 (×9): 4 mL via RESPIRATORY_TRACT
  Filled 2024-01-28 (×8): qty 4

## 2024-01-28 MED ORDER — POLYETHYLENE GLYCOL 3350 17 G PO PACK
17.0000 g | PACK | Freq: Every day | ORAL | Status: DC
  Administered 2024-01-28 – 2024-01-29 (×2): 17 g
  Filled 2024-01-28 (×2): qty 1

## 2024-01-28 MED ORDER — AMIODARONE HCL IN DEXTROSE 360-4.14 MG/200ML-% IV SOLN
60.0000 mg/h | INTRAVENOUS | Status: AC
  Administered 2024-01-28 – 2024-01-29 (×2): 60 mg/h via INTRAVENOUS

## 2024-01-28 MED ORDER — AMIODARONE HCL IN DEXTROSE 360-4.14 MG/200ML-% IV SOLN: 30.0000 mg/h | INTRAVENOUS | Status: DC

## 2024-01-28 MED ORDER — AMIODARONE HCL IN DEXTROSE 360-4.14 MG/200ML-% IV SOLN
INTRAVENOUS | Status: AC
Start: 2024-01-28 — End: 2024-01-29
  Filled 2024-01-28: qty 200

## 2024-01-28 MED ORDER — FENTANYL 2500MCG IN NS 250ML (10MCG/ML) PREMIX INFUSION
50.0000 ug/h | INTRAVENOUS | Status: DC
  Administered 2024-01-28: 50 ug/h via INTRAVENOUS
  Administered 2024-01-29: 100 ug/h via INTRAVENOUS
  Filled 2024-01-28 (×2): qty 250

## 2024-01-28 MED ORDER — CALCIUM GLUCONATE-NACL 1-0.675 GM/50ML-% IV SOLN
1.0000 g | Freq: Once | INTRAVENOUS | Status: AC
  Administered 2024-01-28: 1000 mg via INTRAVENOUS
  Filled 2024-01-28: qty 50

## 2024-01-28 MED ORDER — FENTANYL BOLUS VIA INFUSION
50.0000 ug | INTRAVENOUS | Status: DC | PRN
  Administered 2024-01-28 – 2024-01-29 (×8): 50 ug via INTRAVENOUS

## 2024-01-28 MED ORDER — AMIODARONE HCL IN DEXTROSE 360-4.14 MG/200ML-% IV SOLN: 60.0000 mg/h | INTRAVENOUS | Status: DC

## 2024-01-28 MED ORDER — FENTANYL CITRATE PF 50 MCG/ML IJ SOSY
50.0000 ug | PREFILLED_SYRINGE | Freq: Once | INTRAMUSCULAR | Status: AC
  Administered 2024-01-28: 50 ug via INTRAVENOUS

## 2024-01-28 NOTE — Progress Notes (Signed)
 OT Cancellation Note  Patient Details Name: Maren Maney MRN: [id number] DOB: 1946-08-24   Cancelled Treatment:    Reason Eval/Treat Not Completed: Medical issues which prohibited therapy  Patient remains intubated and sedated. RN recommending OT attempt tomorrow 5/11.   Karilyn Ouch, OTR/L [facility] Office: [phone]   Emery Hans 01/28/2024, 8:48 AM

## 2024-01-28 NOTE — Plan of Care (Signed)
  Problem: Cardiovascular: Goal: Vascular access site(s) Level 0-1 will be maintained Outcome: Progressing   Problem: Clinical Measurements: Goal: Will remain free from infection Outcome: Progressing Goal: Diagnostic test results will improve Outcome: Progressing

## 2024-01-28 NOTE — Consult Note (Signed)
 NAME:  Kaitlyn Wright, MRN:  [id number], DOB:  1946/04/17, LOS: 5 ADMISSION DATE:  01/23/2024, CONSULTATION DATE: 5/10 REFERRING MD: Luna Salinas, CHIEF COMPLAINT: Postop respiratory failure  History of Present Illness:  Kaitlyn Wright is an 78 year old woman with a history of remote tobacco abuse, hypothyroidism, anxiety who presented with chest pain that she attributed to acid reflux on 5/5.  She was found to have NSTEMI.  She had had similar chest discomfort that had resolved several weeks prior to admission.  She continues to work 3 days/week and care for her 2 dogs at home.  She totally independent at baseline.  Left heart cath demonstrated three-vessel disease and she was referred for CABG.  Due to postoperative swelling in concern for bleeding that she had the operating room requiring blood transfusions she was kept intubated overnight.  Today she has required PEEP supplemental oxygen and has failed the rapid postop weaning protocol.  PCCM consulted for hypoxia.  Remote history of tobacco abuse, quit many years ago.  Not on inhalers prior to admission.  Not on oxygen prior to surgery.  LVEF 60 to 65%, normal RV.  Operative cross-clamp time 1 hour 20 minutes. Bypass time 1h 50 min. Required second short bypass run for bleeding. EBL 450cc. 3 RBC, 2 FFP, 1 platelets administered.  Pertinent  Medical History  Hypothyroidism  Significant Hospital Events: Including procedures, antibiotic start and stop dates in addition to other pertinent events   5/5 admitted, started on heparin  5/6 left heart catheterization  Interim History / Subjective:    Objective    Blood pressure (!) 144/68, pulse 90, temperature 97.9 F (36.6 C), resp. rate 14, height 5\' 3"  (1.6 m), weight 68 kg, SpO2 97%. PAP: (15-137)/(2-67) 20/7 CVP:  [0 mmHg-38 mmHg] 6 mmHg CO:  [1.8 L/min-4.7 L/min] 3.5 L/min CI:  [1.1 L/min/m2-2.96 L/min/m2] 2.2 L/min/m2  Vent Mode: SIMV;PRVC;PSV FiO2 (%):  [50 %-60 %] 60 % Set Rate:  [16 bmp]  16 bmp Vt Set:  [420 mL] 420 mL PEEP:  [5 cmH20] 5 cmH20 Pressure Support:  [10 cmH20] 10 cmH20 Plateau Pressure:  [12 cmH20-17 cmH20] 12 cmH20   Intake/Output Summary (Last 24 hours) at 01/28/2024 1013 Last data filed at 01/28/2024 0900 Gross per 24 hour  Intake 10685.97 ml  Output 4295 ml  Net 6390.97 ml   Filed Weights   01/27/24 0516 01/27/24 0654 01/28/24 0500  Weight: 57.3 kg 57.3 kg 68 kg    Examination: General: Critically ill-appearing woman lying in bed no acute distress HENT: [place]/AT, eyes anicteric, endotracheal tube in place Lungs: No wheezing, no rhonchi.  Minimal endotracheal secretions.  Decreased basilar breath sounds Cardiovascular: S1-S2, regular rate and rhythm Abdomen: Soft, nontender Extremities: Minimal peripheral edema, no cyanosis Neuro: RASS +1, able to answer yes and no questions, moving all extremities, strong gag and cough GU: Clear yellow urine  Resolved Hospital Problem list     Assessment & Plan:  Three-vessel coronary artery disease with NSTEMI, s/p CABG x4 using LIMA and RSVG Postop cardiogenic shock -post-op care per TCTS -complete post op antibiotics -pain control per protocol- morphine, tramadol, oxycodone. Added fentanyl  for PAD protocol for vent.  -aspirin , statin daily -Hold beta-blocker since requiring inotropes - Serial cooximeter --Inotrope management per advanced heart failure team  Post-op vent management, acute respiratory failure with hypoxia.  Suspect acute pulmonary edema and a component of shunt through the left lower lobe atelectasis.  No concerning signs to suggest pneumonia.  Preoperatively was on room air and she has  no significant airspace disease outside of left lower lobe atelectasis. - Switch to PRVC - Diuresis today - Adding chest PT and hypertonic saline nebs.  Discussed with her daughter that she is going to have to cough to work to reopen her left lower lobe. -VAP prevention protocol -pulmonary  hygiene  Hyperglycemia; A1c 5.6 -insulin  gtt per protocol  ABLA- expected post-op Consumptive thrombocytopenia- expected post-op Consumptive coagulopathy- expected post-op -Status post RBCs, FFP, platelets perioperatively - Monitor chest tube output - No current indication for transfusions  Leukocytosis most likely reactive postop -Monitor    Best Practice (right click and "Reselect all SmartList Selections" daily)   Diet/type: NPO DVT prophylaxis SCD Pressure ulcer(s): N/A GI prophylaxis: PPI Lines: Central line, Arterial Line, and yes and it is still needed Foley:  Yes, and it is still needed Code Status:  full code Last date of multidisciplinary goals of care discussion [daughter updated 5/10]  Labs   CBC: Recent Labs  Lab 01/25/24 0700 01/26/24 0307 01/27/24 0802 01/27/24 1100 01/27/24 1151 01/27/24 1412 01/27/24 2005 01/27/24 2343 01/28/24 0404 01/28/24 0552  WBC 11.6* 11.8*  --   --   --  13.2* 11.6*  --  11.7*  --   HGB 13.7 13.5   < > 6.7*   < > 11.0* 9.4* 8.5* 10.6* 9.5*  HCT 42.1 40.7   < > 20.6*   < > 32.8* 28.0* 25.0* 31.1* 28.0*  MCV 95.5 96.0  --   --   --  92.1 92.7  --  92.3  --   PLT 277 262  --  161  --  43* 144*  --  157  --    < > = values in this interval not displayed.    Basic Metabolic Panel: Recent Labs  Lab 01/25/24 0700 01/26/24 0307 01/27/24 0802 01/27/24 1044 01/27/24 1151 01/27/24 1240 01/27/24 1252 01/27/24 2005 01/27/24 2343 01/28/24 0138 01/28/24 0552 01/28/24 0644  NA 137 141   < > 139   < > 139   < > 141 144 140 141 141  K 4.0 4.5   < > 5.3*   < > 5.0   < > 3.8 3.2* 3.2* 3.1* 3.4*  CL 105 107   < > 108  --  107  --  110  --  108  --  109  CO2 20* 25  --   --   --   --   --  21*  --  18*  --  19*  GLUCOSE 101* 109*   < > 141*  --  192*  --  235*  --  208*  --  163*  BUN 23 18   < > 16  --  17  --  13  --  14  --  14  CREATININE 0.95 0.92   < > 0.70  --  0.70  --  1.08*  --  1.20*  --  1.12*  CALCIUM  9.0 9.2  --    --   --   --   --  8.4*  --  8.5*  --  8.4*  MG 2.1 2.1  --   --   --   --   --  3.3*  --  2.6*  --   --    < > = values in this interval not displayed.   GFR: Estimated Creatinine Clearance: 37.1 mL/min (A) (by C-G formula based on SCr of 1.12 mg/dL (H)). Recent Labs  Lab 01/26/24  1610 01/27/24 1412 01/27/24 2005 01/28/24 0404  WBC 11.8* 13.2* 11.6* 11.7*    Liver Function Tests: Recent Labs  Lab 01/24/24 0416  AST 99*  ALT 33  ALKPHOS 66  BILITOT 1.4*  PROT 7.0  ALBUMIN 3.7   No results for input(s): "LIPASE", "AMYLASE" in the last 168 hours. No results for input(s): "AMMONIA" in the last 168 hours.  ABG    Component Value Date/Time   PHART 7.340 (L) 01/28/2024 0552   PCO2ART 33.1 01/28/2024 0552   PO2ART 74 (L) 01/28/2024 0552   HCO3 18.0 (L) 01/28/2024 0552   TCO2 19 (L) 01/28/2024 0552   ACIDBASEDEF 7.0 (H) 01/28/2024 0552   O2SAT 84.6 01/28/2024 0557     Coagulation Profile: Recent Labs  Lab 01/27/24 1412  INR 1.6*    Cardiac Enzymes: No results for input(s): "CKTOTAL", "CKMB", "CKMBINDEX", "TROPONINI" in the last 168 hours.  HbA1C: Hgb A1c MFr Bld  Date/Time Value Ref Range Status  01/24/2024 04:16 AM 5.6 4.8 - 5.6 % Final    Comment:    (NOTE) Pre diabetes:          5.7%-6.4%  Diabetes:              >6.4%  Glycemic control for   <7.0% adults with diabetes     CBG: Recent Labs  Lab 01/28/24 0402 01/28/24 0509 01/28/24 0549 01/28/24 0759 01/28/24 0900  GLUCAP 196* 170* 188* 134* 112*    Review of Systems:   Limited due to intubated status  Past Medical History:  She,  has a past medical history of Anxiety, Depression, and Hypothyroidism.   Surgical History:   Past Surgical History:  Procedure Laterality Date   CHOLECYSTECTOMY     LEFT HEART CATH AND CORONARY ANGIOGRAPHY N/A 01/24/2024   Procedure: LEFT HEART CATH AND CORONARY ANGIOGRAPHY;  Surgeon: [name], MD;  Location: [facility] INVASIVE [facility];  Service: Cardiovascular;   Laterality: N/A;     Social History:   reports that she has never smoked. She has never used smokeless tobacco. She reports that she does not drink alcohol and does not use drugs.   Family History:  Her family history includes Stroke in her father.   Allergies Allergies  Allergen Reactions   Naprosyn  [Naproxen ]     Upset stomach    Latex Rash     Home Medications  Prior to Admission medications   Medication Sig Start Date End Date Taking? Authorizing Provider  acetaminophen  (TYLENOL ) 500 MG tablet Take 1,000 mg by mouth See admin instructions. Take 1000mg  (2 tablets) by mouth every morning and 1000mg  (2 tablets) at night if needed for pain.   Yes [provider]  ALPRAZolam  (XANAX ) 1 MG tablet Take 1 mg by mouth See admin instructions. Take 1mg  (1 tablet) by mouth every night and 1mg  (1 tablet) during the day if needed.   Yes [provider]  aspirin  EC 325 MG tablet Take 325-650 mg by mouth daily as needed for moderate pain (pain score 4-6).   Yes [provider]  aspirin  EC 81 MG tablet Take 81 mg by mouth every morning.   Yes [provider]  B Complex-C (B-COMPLEX WITH VITAMIN C) tablet Take 1 tablet by mouth daily.   Yes [provider]  cholecalciferol  (VITAMIN D ) 1000 UNITS tablet Take 1,000 Units by mouth daily.   Yes [provider]  Cinnamon 500 MG capsule Take 500 mg by mouth daily.   Yes [provider]  hydrOXYzine  (ATARAX ) 10 MG tablet Take 10 mg by mouth at bedtime. 01/03/24  Yes [provider]  Oksana Bergamo Oil 1000 MG CAPS Take 1 capsule by mouth daily.   Yes [provider]  latanoprost  (XALATAN ) 0.005 % ophthalmic solution Place 1 drop into both eyes at bedtime. 09/16/23  Yes [provider]  levothyroxine  (SYNTHROID ) 88 MCG tablet Take 88 mcg by mouth daily.   Yes [provider]  loperamide (IMODIUM) 1 MG/5ML solution Take 5 mLs by mouth daily as needed for diarrhea or  loose stools.   Yes [provider]  Omega-3 Fatty Acids (FISH OIL) 1000 MG CPDR Take 1 capsule by mouth 2 (two) times daily.   Yes [provider]  Polyethyl Glycol-Propyl Glycol (SYSTANE FREE OP) Place 1 drop into both eyes 2 (two) times daily.   Yes [provider]  Red Yeast Rice 600 MG CAPS Take 2 capsules by mouth 2 (two) times daily.   Yes [provider]  vitamin C (ASCORBIC ACID) 500 MG tablet Take 500 mg by mouth daily.   Yes [provider]  pantoprazole  (PROTONIX ) 40 MG tablet Take 40 mg by mouth daily. Patient not taking: Reported on 01/23/2024 09/02/23   [provider]     Critical care time: 50 min.      Joesph Mussel, DO 01/28/24 5:28 PM [facility]  For contact information, see Amion. If no response to pager, please call PCCM consult pager. After hours, 7PM- 7AM, please call Elink.

## 2024-01-28 NOTE — Anesthesia Postprocedure Evaluation (Signed)
 Anesthesia Post Note  Patient: Kaitlyn Wright  Procedure(s) Performed: CORONARY ARTERY BYPASS GRAFTING X 4, USING LEFT INTERNAL MAMMARY ARTERY AND RIGHT ENDOSCOPIC HARVESTED GREATER SAPHENOUS VEIN (Chest) ECHOCARDIOGRAM, TRANSESOPHAGEAL     Patient location during evaluation: SICU Anesthesia Type: General Level of consciousness: sedated Pain management: pain level controlled Vital Signs Assessment: post-procedure vital signs reviewed and stable Respiratory status: patient remains intubated per anesthesia plan Cardiovascular status: unstable Postop Assessment: no apparent nausea or vomiting Anesthetic complications: no Comments: Milrinon, epi, levophed  infusions for support   No notable events documented.  Last Vitals:    Last Pain:                 Erin Havers

## 2024-01-28 NOTE — Progress Notes (Signed)
 Hypoglycemic Event  CBG: 53  Treatment: D50 35mL 17.5g   Symptoms: None  Follow-up CBG: Time:1536 CBG Result:150  Possible Reasons for Event: Medication regimen: insulin  gtt     Solicitor

## 2024-01-28 NOTE — Consult Note (Signed)
 Advanced Heart Failure Team Consult Note   Primary Physician: Roselind Congo, MD Cardiologist:  None  Reason for Consultation: Shock post-op  HPI:    Kaitlyn Wright is seen today for evaluation of post-op vasoplegic shock and volume overload at the request of Dr. Luna Salinas.   78 y.o. with history of hypothyroidism, no prior cardiac history.  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  She woke up at 3:30 am on 5/5 with severe substernal CP.  Patient came to the ER, HS-TnI 2503.  ECG with inferolateral TWIs. Cath on 5/6 showed diffuse severe LAD and D1 disease, 90% proximal ramus, 90% distal RCA.  Echo showed EF 60-65%, normal RV.  On 5/9 patient had CABG with LIMA-LAD, SVG-PDA, sequential SVG-ramus and diagonal.    Post-op, she has been slow to wean vent.  Creatinine stable at 1.12.  She is on milrinone  0.25, epinephrine  4, NE 5.  She has not had diuresis, CVP is 20. Co-ox this morning was 84%. She will wake up on the vent.   Home Medications Prior to Admission medications   Medication Sig Start Date End Date Taking? Authorizing Provider  acetaminophen  (TYLENOL ) 500 MG tablet Take 1,000 mg by mouth See admin instructions. Take 1000mg  (2 tablets) by mouth every morning and 1000mg  (2 tablets) at night if needed for pain.   Yes [provider]  ALPRAZolam  (XANAX ) 1 MG tablet Take 1 mg by mouth See admin instructions. Take 1mg  (1 tablet) by mouth every night and 1mg  (1 tablet) during the day if needed.   Yes [provider]  aspirin  EC 325 MG tablet Take 325-650 mg by mouth daily as needed for moderate pain (pain score 4-6).   Yes [provider]  aspirin  EC 81 MG tablet Take 81 mg by mouth every morning.   Yes [provider]  B Complex-C (B-COMPLEX WITH VITAMIN C) tablet Take 1 tablet by mouth daily.   Yes [provider]  cholecalciferol  (VITAMIN D ) 1000 UNITS tablet Take 1,000 Units by mouth daily.   Yes [provider]  Cinnamon 500  MG capsule Take 500 mg by mouth daily.   Yes [provider]  hydrOXYzine  (ATARAX ) 10 MG tablet Take 10 mg by mouth at bedtime. 01/03/24  Yes [provider]  Oksana Bergamo Oil 1000 MG CAPS Take 1 capsule by mouth daily.   Yes [provider]  latanoprost  (XALATAN ) 0.005 % ophthalmic solution Place 1 drop into both eyes at bedtime. 09/16/23  Yes [provider]  levothyroxine  (SYNTHROID ) 88 MCG tablet Take 88 mcg by mouth daily.   Yes [provider]  loperamide (IMODIUM) 1 MG/5ML solution Take 5 mLs by mouth daily as needed for diarrhea or loose stools.   Yes [provider]  Omega-3 Fatty Acids (FISH OIL) 1000 MG CPDR Take 1 capsule by mouth 2 (two) times daily.   Yes [provider]  Polyethyl Glycol-Propyl Glycol (SYSTANE FREE OP) Place 1 drop into both eyes 2 (two) times daily.   Yes [provider]  Red Yeast Rice 600 MG CAPS Take 2 capsules by mouth 2 (two) times daily.   Yes [provider]  vitamin C (ASCORBIC ACID) 500 MG tablet Take 500 mg by mouth daily.   Yes [provider]  pantoprazole  (PROTONIX ) 40 MG tablet Take 40 mg by mouth daily. Patient not taking: Reported on 01/23/2024 09/02/23   [provider]    Past Medical History: Past Medical History:  Diagnosis Date   Anxiety    Depression    Hypothyroidism     Past Surgical History: Past Surgical History:  Procedure Laterality Date   CHOLECYSTECTOMY     LEFT HEART CATH AND CORONARY ANGIOGRAPHY N/A 01/24/2024   Procedure: LEFT HEART CATH AND CORONARY ANGIOGRAPHY;  Surgeon: [name], MD;  Location: [facility];  Service: Cardiovascular;  Laterality: N/A;    Family History: Family History  Problem Relation Age of Onset   Stroke Father     Social History: Social History   Socioeconomic History   Marital status: Married    Spouse name: Not on file   Number of children: Not on file   Years of education: Not on file    Highest education level: Not on file  Occupational History   Not on file  Tobacco Use   Smoking status: Never   Smokeless tobacco: Never  Vaping Use   Vaping status: Never Used  Substance and Sexual Activity   Alcohol use: No   Drug use: Never   Sexual activity: Not on file  Other Topics Concern   Not on file  Social History Narrative   Not on file   Social Drivers of Health   Financial Resource Strain: Not on file  Food Insecurity: No Food Insecurity (01/25/2024)   Hunger Vital Sign    Worried About Running Out of Food in the Last Year: Never true    Ran Out of Food in the Last Year: Never true  Transportation Needs: No Transportation Needs (01/25/2024)   PRAPARE - Administrator, Civil Service (Medical): No    Lack of Transportation (Non-Medical): No  Physical Activity: Not on file  Stress: Not on file  Social Connections: Unknown (01/25/2024)   Social Connection and Isolation Panel [NHANES]    Frequency of Communication with Friends and Family: More than three times a week    Frequency of Social Gatherings with Friends and Family: More than three times a week    Attends Religious Services: Not on file    Active Member of Clubs or Organizations: Not on file    Attends Banker Meetings: Not on file    Marital Status: Not on file    Allergies:  Allergies  Allergen Reactions   Naprosyn  [Naproxen ]     Upset stomach    Latex Rash    Objective:    Vital Signs:   Temp:  [96.4 F (35.8 C)-98.6 F (37 C)] 97.5 F (36.4 C) (05/10 1615) Pulse Rate:  [73-111] 77 (05/10 1615) Resp:  [13-36] 15 (05/10 1615) BP: (136)/(53) 136/53 (05/10 1322) SpO2:  [92 %-100 %] 97 % (05/10 1615) Arterial Line BP: (71-266)/(38-157) 101/48 (05/10 1615) FiO2 (%):  [50 %-60 %] 60 % (05/10 1322) Weight:  [68 kg] 68 kg (05/10 0500) Last BM Date : 01/25/24  Weight change: Filed Weights   01/27/24 0516 01/27/24 0654 01/28/24 0500  Weight: 57.3 kg 57.3 kg 68 kg     Intake/Output:   Intake/Output Summary (Last 24 hours) at 01/28/2024 1915 Last data filed at 01/28/2024 1901 Gross per 24 hour  Intake 3970 ml  Output 2450 ml  Net 1520 ml      Physical Exam    General:  Sedated on vent HEENT: normal Neck: supple. JVP 16 cm. Carotids 2+ bilat; no bruits. No lymphadenopathy or thyromegaly appreciated. Cor: PMI nondisplaced. Regular rate & rhythm. No rubs, gallops or murmurs. Lungs: clear Abdomen: soft, nontender, nondistended. No  hepatosplenomegaly. No bruits or masses. Good bowel sounds. Extremities: no cyanosis, clubbing, rash, edema Neuro: alert & orientedx3, cranial nerves grossly intact. moves all 4 extremities w/o difficulty. Affect pleasant   Telemetry   NSR 70s (personally reviewed)  EKG    NSR, nonspecific T wave flattening  Labs   Basic Metabolic Panel: Recent Labs  Lab 01/25/24 0700 01/26/24 0307 01/27/24 0802 01/27/24 1240 01/27/24 1252 01/27/24 2005 01/27/24 2343 01/28/24 0138 01/28/24 0552 01/28/24 0644 01/28/24 1647  NA 137 141   < > 139   < > 141 144 140 141 141 138  K 4.0 4.5   < > 5.0   < > 3.8 3.2* 3.2* 3.1* 3.4* 4.0  CL 105 107   < > 107  --  110  --  108  --  109 110  CO2 20* 25  --   --   --  21*  --  18*  --  19* 20*  GLUCOSE 101* 109*   < > 192*  --  235*  --  208*  --  163* 160*  BUN 23 18   < > 17  --  13  --  14  --  14 15  CREATININE 0.95 0.92   < > 0.70  --  1.08*  --  1.20*  --  1.12* 0.93  CALCIUM  9.0 9.2  --   --   --  8.4*  --  8.5*  --  8.4* 7.8*  MG 2.1 2.1  --   --   --  3.3*  --  2.6*  --   --  2.2   < > = values in this interval not displayed.    Liver Function Tests: Recent Labs  Lab 01/24/24 0416  AST 99*  ALT 33  ALKPHOS 66  BILITOT 1.4*  PROT 7.0  ALBUMIN 3.7   No results for input(s): "LIPASE", "AMYLASE" in the last 168 hours. No results for input(s): "AMMONIA" in the last 168 hours.  CBC: Recent Labs  Lab 01/26/24 0307 01/27/24 0802 01/27/24 1100  01/27/24 1151 01/27/24 1412 01/27/24 2005 01/27/24 2343 01/28/24 0404 01/28/24 0552 01/28/24 1647  WBC 11.8*  --   --   --  13.2* 11.6*  --  11.7*  --  19.8*  HGB 13.5   < > 6.7*   < > 11.0* 9.4* 8.5* 10.6* 9.5* 9.9*  HCT 40.7   < > 20.6*   < > 32.8* 28.0* 25.0* 31.1* 28.0* 29.1*  MCV 96.0  --   --   --  92.1 92.7  --  92.3  --  91.5  PLT 262  --  161  --  43* 144*  --  157  --  112*   < > = values in this interval not displayed.    Cardiac Enzymes: No results for input(s): "CKTOTAL", "CKMB", "CKMBINDEX", "TROPONINI" in the last 168 hours.  BNP: BNP (last 3 results) No results for input(s): "BNP" in the last 8760 hours.  ProBNP (last 3 results) No results for input(s): "PROBNP" in the last 8760 hours.   CBG: Recent Labs  Lab 01/28/24 1109 01/28/24 1114 01/28/24 1152 01/28/24 1258 01/28/24 1406  GLUCAP 81 78 90 150* 127*    Coagulation Studies: Recent Labs    01/27/24 1412  LABPROT 19.7*  INR 1.6*     Imaging   DG Chest Port 1 View Result Date: 01/28/2024 CLINICAL DATA:  [id number] S/P CABG x 4 [id number] EXAM: PORTABLE CHEST 1  VIEW COMPARISON:  Jan 27, 2024 FINDINGS: The cardiomediastinal silhouette is unchanged in contour.Atherosclerotic calcifications. ETT tip terminates 2.5 cm above the carina. Enteric tube side port projects over the stomach. RIGHT IJ PA catheter tip terminates over the main pulmonary artery. Bilateral chest tubes and midline drain. No pleural effusion. No significant pneumothorax. LEFT retrocardiac homogeneous opacity most consistent with atelectasis. Temporary cardiac pacing wires. IMPRESSION: 1. Support apparatus as described above. 2. LEFT retrocardiac opacity most consistent with atelectasis. Electronically Signed   By: Clancy Crimes M.D.   On: 01/28/2024 10:24     Medications:     Current Medications:  sodium chloride    Intravenous Once   acetaminophen   1,000 mg Oral Q6H   Or   acetaminophen  (TYLENOL ) oral liquid 160 mg/5 mL  1,000  mg Per Tube Q6H   ALPRAZolam   1 mg Per Tube QHS   aspirin  EC  325 mg Oral Daily   Or   aspirin   324 mg Per Tube Daily   atorvastatin   80 mg Per Tube q1800   bisacodyl  10 mg Oral Daily   Or   bisacodyl  10 mg Rectal Daily   Chlorhexidine  Gluconate Cloth  6 each Topical Daily   docusate  100 mg Per Tube BID   hydrOXYzine   10 mg Per Tube QHS   latanoprost   1 drop Both Eyes QHS   levothyroxine   88 mcg Per Tube Q0600   metoprolol  tartrate  12.5 mg Oral BID   Or   metoprolol  tartrate  12.5 mg Per Tube BID   mouth rinse  15 mL Mouth Rinse Q2H   pantoprazole  (PROTONIX ) IV  40 mg Intravenous Q24H   polyethylene glycol  17 g Per Tube Daily   sodium chloride  flush  3 mL Intravenous Q12H   sodium chloride  HYPERTONIC  4 mL Nebulization Q4H    Infusions:  sodium chloride      sodium chloride      amiodarone     amiodarone     amiodarone      ceFAZolin  (ANCEF ) IV Stopped (01/28/24 1413)   dexmedetomidine  (PRECEDEX ) IV infusion 0.7 mcg/kg/hr (01/28/24 1600)   epinephrine  3 mcg/min (01/28/24 1600)   fentaNYL  infusion INTRAVENOUS 50 mcg/hr (01/28/24 1600)   insulin  Stopped (01/28/24 1516)   milrinone  0.125 mcg/kg/min (01/28/24 1600)   nitroGLYCERIN  Stopped (01/27/24 1900)   norepinephrine  (LEVOPHED ) Adult infusion 6 mcg/min (01/28/24 1600)   phenylephrine  (NEO-SYNEPHRINE) Adult infusion        Assessment/Plan   1. CAD: Patient presented with NSTEMI and cath showing diffuse coronary disease as above.  Now s/p CABG x 4.   - Continue ASA - Continue atorvastatin .  2. Post-op shock: Suspect component of vasoplegia.  Echo pre-op with EF 60-65%, normal RV.  Currently on milrinone  0.25, NE 5, epinephrine  4.  Co-ox 84%.  CVP currently is around 20.  - Wean pressors slowly.  - Would continue milrinone  0.25 for now, may need to decrease if cardiac output stable and BP low.  - She will need diuresis, will give Lasix 60 mg IV x 1 and follow response.  - Needs post-op echo.  3. Acute hypoxemic  respiratory failure: Slow to wean from vent. CVP is very high, suspect volume overload plays a role.  - Diuresis as above.   CRITICAL CARE Performed by: Peder Bourdon  Total critical care time: 50 minutes  Critical care time was exclusive of separately billable procedures and treating other patients.  Critical care was necessary to treat or prevent imminent  or life-threatening deterioration.  Critical care was time spent personally by me on the following activities: development of treatment plan with patient and/or surrogate as well as nursing, discussions with consultants, evaluation of patient's response to treatment, examination of patient, obtaining history from patient or surrogate, ordering and performing treatments and interventions, ordering and review of laboratory studies, ordering and review of radiographic studies, pulse oximetry and re-evaluation of patient's condition.   Length of Stay: 5  Peder Bourdon, MD  01/28/2024, 7:15 PM    Advanced Heart Failure Team Pager [phone] (M-F; 7a - 5p)  Please contact [facility] for night-coverage after hours (4p -7a ) and weekends on [email or web address]

## 2024-01-28 NOTE — Progress Notes (Signed)
 Daughter updated at bedside.  Joesph Mussel, DO 01/28/24 5:13 PM [facility]  For contact information, see Amion. If no response to pager, please call PCCM consult pager. After hours, 7PM- 7AM, please call Elink.

## 2024-01-28 NOTE — Progress Notes (Signed)
      [address]       [place] [address]             [phone]      POD # 1  Still intubated, sedated  BP (!) 136/53   Pulse 77   Temp (!) 97.5 F (36.4 C)   Resp 15   Ht 5\' 3"  (1.6 m)   Wt 68 kg   SpO2 97%   BMI 26.56 kg/m  24/14 CI 1.6 Milrinone  0.125, epi 3,norepi 6  Intake/Output Summary (Last 24 hours) at 01/28/2024 1706 Last data filed at 01/28/2024 1600 Gross per 24 hour  Intake 4755.7 ml  Output 2095 ml  Net 2660.7 ml   PM labs pending  Will try to wean FiO2, if able to get to 40% can try to wean vent Agree with diuresis but will give albumin first to expand intravascular volume  [name], MD [facility] [phone]

## 2024-01-28 NOTE — Progress Notes (Signed)
 1 Day Post-Op Procedure(s) (LRB): CORONARY ARTERY BYPASS GRAFTING X 4, USING LEFT INTERNAL MAMMARY ARTERY AND RIGHT ENDOSCOPIC HARVESTED GREATER SAPHENOUS VEIN (N/A) ECHOCARDIOGRAM, TRANSESOPHAGEAL (N/A) Subjective: Intubated, wakes up and follows commands but very weak  Objective: Vital signs in last 24 hours: Temp:  [92.7 F (33.7 C)-98.6 F (37 C)] 97.9 F (36.6 C) (05/10 0908) Pulse Rate:  [75-116] 90 (05/10 0908) Cardiac Rhythm: Normal sinus rhythm (05/09 2011) Resp:  [9-36] 14 (05/10 0908) SpO2:  [92 %-100 %] 97 % (05/10 0908) Arterial Line BP: (69-266)/(38-157) 102/42 (05/10 0908) FiO2 (%):  [50 %-60 %] 60 % (05/10 0735) Weight:  [68 kg] 68 kg (05/10 0500)  Hemodynamic parameters for last 24 hours: PAP: (15-137)/(2-67) 20/7 CVP:  [0 mmHg-38 mmHg] 6 mmHg CO:  [1.8 L/min-4.7 L/min] 3.5 L/min CI:  [1.1 L/min/m2-2.96 L/min/m2] 2.2 L/min/m2  Intake/Output from previous day: 05/09 0701 - 05/10 0700 In: 11215.5 [I.V.:5929.6; Blood:2003.7; IV Piggyback:3282.2] Out: 4270 [Urine:2390; Emesis/NG output:150; Blood:450; Chest Tube:1280] Intake/Output this shift: Total I/O In: 220.5 [I.V.:220.5] Out: 100 [Urine:20; Chest Tube:80]  General appearance: cooperative and no distress Neurologic: intact Heart: regular rate and rhythm Lungs: diminished breath sounds bibasilar Abdomen: normal findings: soft, non-tender  Lab Results: Recent Labs    01/27/24 2005 01/27/24 2343 01/28/24 0404 01/28/24 0552  WBC 11.6*  --  11.7*  --   HGB 9.4*   < > 10.6* 9.5*  HCT 28.0*   < > 31.1* 28.0*  PLT 144*  --  157  --    < > = values in this interval not displayed.   BMET:  Recent Labs    01/28/24 0138 01/28/24 0552 01/28/24 0644  NA 140 141 141  K 3.2* 3.1* 3.4*  CL 108  --  109  CO2 18*  --  19*  GLUCOSE 208*  --  163*  BUN 14  --  14  CREATININE 1.20*  --  1.12*  CALCIUM  8.5*  --  8.4*    PT/INR:  Recent Labs    01/27/24 1412  LABPROT 19.7*  INR 1.6*   ABG     Component Value Date/Time   PHART 7.340 (L) 01/28/2024 0552   HCO3 18.0 (L) 01/28/2024 0552   TCO2 19 (L) 01/28/2024 0552   ACIDBASEDEF 7.0 (H) 01/28/2024 0552   O2SAT 84.6 01/28/2024 0557   CBG (last 3)  Recent Labs    01/28/24 0759 01/28/24 0900 01/28/24 1013  GLUCAP 134* 112* 107*    Assessment/Plan: S/P Procedure(s) (LRB): CORONARY ARTERY BYPASS GRAFTING X 4, USING LEFT INTERNAL MAMMARY ARTERY AND RIGHT ENDOSCOPIC HARVESTED GREATER SAPHENOUS VEIN (N/A) ECHOCARDIOGRAM, TRANSESOPHAGEAL (N/A) POD # 1 NEURO- no focal deficit but lethargic  Monitor CV- in SR, good cardiac index  On milrinone  0.3, epi 10, norepi 5  Filling pressures very low- give albumin this AM RESP- unable to extubate last night due to swelling, no cuff leak  No cuff leak this AM  Did receive 1 dose of solumedrol last night  On 60% FiO2, CXR unremarkable- some LLL atelectasis  Will ask CCM to help with vent  Hopefully can wean later today RENAL- creatinine 1.12  Will need diuresis at some point  K 3.4- hypokalemia,supplement ENDO- CBG elevated, exacerbated by Solumedrol GI- advance diet once extubated Thrombocytopenia- improved after platelet transfusion Anemia- Hgb 9.5 post transfusion, monitor SCD for DVT prophylaxis  No enoxaparin due to bleeding risk Keep CT for now  LOS: 5 days    Kaitlyn Wright 01/28/2024

## 2024-01-28 NOTE — Progress Notes (Signed)
 PT Cancellation Note  Patient Details Name: Kaitlyn Wright MRN: [id number] DOB: 1946-02-14   Cancelled Treatment:    Reason Eval/Treat Not Completed: Medical issues which prohibited therapy  Patient remains intubated and sedated. RN recommending PT attempt tomorrow 5/11.    Gayle Kava, PT Acute Rehabilitation Services  Office [phone]  [name] 01/28/2024, 8:02 AM

## 2024-01-29 ENCOUNTER — Inpatient Hospital Stay (HOSPITAL_COMMUNITY)

## 2024-01-29 DIAGNOSIS — J9601 Acute respiratory failure with hypoxia: Secondary | ICD-10-CM | POA: Diagnosis not present

## 2024-01-29 DIAGNOSIS — I214 Non-ST elevation (NSTEMI) myocardial infarction: Secondary | ICD-10-CM | POA: Diagnosis not present

## 2024-01-29 DIAGNOSIS — R570 Cardiogenic shock: Secondary | ICD-10-CM | POA: Diagnosis not present

## 2024-01-29 DIAGNOSIS — R739 Hyperglycemia, unspecified: Secondary | ICD-10-CM | POA: Diagnosis not present

## 2024-01-29 LAB — POCT I-STAT 7, (LYTES, BLD GAS, ICA,H+H)
Acid-base deficit: 4 mmol/L — ABNORMAL HIGH (ref 0.0–2.0)
Acid-base deficit: 4 mmol/L — ABNORMAL HIGH (ref 0.0–2.0)
Acid-base deficit: 4 mmol/L — ABNORMAL HIGH (ref 0.0–2.0)
Bicarbonate: 20 mmol/L (ref 20.0–28.0)
Bicarbonate: 15.9 mmol/L — ABNORMAL LOW (ref 20.0–28.0)
Bicarbonate: 19 mmol/L — ABNORMAL LOW (ref 20.0–28.0)
Calcium, Ion: 1.09 mmol/L — ABNORMAL LOW (ref 1.15–1.40)
Calcium, Ion: 0.98 mmol/L — ABNORMAL LOW (ref 1.15–1.40)
Calcium, Ion: 1.05 mmol/L — ABNORMAL LOW (ref 1.15–1.40)
HCT: 29 % — ABNORMAL LOW (ref 36.0–46.0)
HCT: 27 % — ABNORMAL LOW (ref 36.0–46.0)
HCT: 31 % — ABNORMAL LOW (ref 36.0–46.0)
Hemoglobin: 9.9 g/dL — ABNORMAL LOW (ref 12.0–15.0)
Hemoglobin: 9.2 g/dL — ABNORMAL LOW (ref 12.0–15.0)
Hemoglobin: 10.5 g/dL — ABNORMAL LOW (ref 12.0–15.0)
O2 Saturation: 96 %
O2 Saturation: 96 %
O2 Saturation: 98 %
Patient temperature: 35.9
Patient temperature: 36.7
Patient temperature: 37.8
Potassium: 3.5 mmol/L (ref 3.5–5.1)
Potassium: 4.3 mmol/L (ref 3.5–5.1)
Potassium: 3.7 mmol/L (ref 3.5–5.1)
Sodium: 138 mmol/L (ref 135–145)
Sodium: 139 mmol/L (ref 135–145)
Sodium: 135 mmol/L (ref 135–145)
TCO2: 21 mmol/L — ABNORMAL LOW (ref 22–32)
TCO2: 16 mmol/L — ABNORMAL LOW (ref 22–32)
TCO2: 20 mmol/L — ABNORMAL LOW (ref 22–32)
pCO2 arterial: 28.8 mmHg — ABNORMAL LOW (ref 32–48)
pCO2 arterial: 16.5 mmHg — CL (ref 32–48)
pCO2 arterial: 28.8 mmHg — ABNORMAL LOW (ref 32–48)
pH, Arterial: 7.444 (ref 7.35–7.45)
pH, Arterial: 7.59 — ABNORMAL HIGH (ref 7.35–7.45)
pH, Arterial: 7.431 (ref 7.35–7.45)
pO2, Arterial: 70 mmHg — ABNORMAL LOW (ref 83–108)
pO2, Arterial: 61 mmHg — ABNORMAL LOW (ref 83–108)
pO2, Arterial: 96 mmHg (ref 83–108)

## 2024-01-29 LAB — BASIC METABOLIC PANEL WITH GFR
Anion gap: 6 (ref 5–15)
Anion gap: 12 (ref 5–15)
BUN: 13 mg/dL (ref 8–23)
BUN: 13 mg/dL (ref 8–23)
CO2: 22 mmol/L (ref 22–32)
CO2: 20 mmol/L — ABNORMAL LOW (ref 22–32)
Calcium: 7.7 mg/dL — ABNORMAL LOW (ref 8.9–10.3)
Calcium: 8 mg/dL — ABNORMAL LOW (ref 8.9–10.3)
Chloride: 108 mmol/L (ref 98–111)
Chloride: 103 mmol/L (ref 98–111)
Creatinine, Ser: 1.01 mg/dL — ABNORMAL HIGH (ref 0.44–1.00)
Creatinine, Ser: 1.05 mg/dL — ABNORMAL HIGH (ref 0.44–1.00)
GFR, Estimated: 56 mL/min — ABNORMAL LOW (ref >60)
GFR, Estimated: 54 mL/min — ABNORMAL LOW (ref >60)
Glucose, Bld: 108 mg/dL — ABNORMAL HIGH (ref 70–99)
Glucose, Bld: 135 mg/dL — ABNORMAL HIGH (ref 70–99)
Potassium: 4 mmol/L (ref 3.5–5.1)
Potassium: 4.1 mmol/L (ref 3.5–5.1)
Sodium: 136 mmol/L (ref 135–145)
Sodium: 135 mmol/L (ref 135–145)

## 2024-01-29 LAB — CBC
HCT: 30.8 % — ABNORMAL LOW (ref 36.0–46.0)
Hemoglobin: 10.7 g/dL — ABNORMAL LOW (ref 12.0–15.0)
MCH: 32 pg (ref 26.0–34.0)
MCHC: 34.7 g/dL (ref 30.0–36.0)
MCV: 92.2 fL (ref 80.0–100.0)
Platelets: 115 10*3/uL — ABNORMAL LOW (ref 150–400)
RBC: 3.34 MIL/uL — ABNORMAL LOW (ref 3.87–5.11)
RDW: 15.2 % (ref 11.5–15.5)
WBC: 25.6 10*3/uL — ABNORMAL HIGH (ref 4.0–10.5)
nRBC: 0 % (ref 0.0–0.2)

## 2024-01-29 LAB — COOXEMETRY PANEL
Carboxyhemoglobin: 1.5 % (ref 0.5–1.5)
Methemoglobin: <0.7 % (ref 0.0–1.5)
O2 Saturation: 68.8 %
Total hemoglobin: 9.9 g/dL — ABNORMAL LOW (ref 12.0–16.0)

## 2024-01-29 LAB — GLUCOSE, CAPILLARY
Glucose-Capillary: 141 mg/dL — ABNORMAL HIGH (ref 70–99)
Glucose-Capillary: 157 mg/dL — ABNORMAL HIGH (ref 70–99)
Glucose-Capillary: 185 mg/dL — ABNORMAL HIGH (ref 70–99)
Glucose-Capillary: 95 mg/dL (ref 70–99)

## 2024-01-29 MED ORDER — SODIUM CHLORIDE 0.9 % IV SOLN: INTRAVENOUS | Status: AC | PRN

## 2024-01-29 MED ORDER — INSULIN ASPART 100 UNIT/ML IJ SOLN: 0.0000 [IU] | INTRAMUSCULAR | Status: DC

## 2024-01-29 MED ORDER — NOREPINEPHRINE 16 MG/250ML-% IV SOLN
0.0000 ug/min | INTRAVENOUS | Status: DC
  Administered 2024-01-29: 21 ug/min via INTRAVENOUS
  Filled 2024-01-29: qty 250

## 2024-01-29 MED ORDER — FUROSEMIDE 10 MG/ML IJ SOLN
80.0000 mg | Freq: Two times a day (BID) | INTRAMUSCULAR | Status: AC
  Administered 2024-01-29 (×2): 80 mg via INTRAVENOUS
  Filled 2024-01-29 (×2): qty 8

## 2024-01-29 MED ORDER — INSULIN ASPART 100 UNIT/ML IJ SOLN
0.0000 [IU] | INTRAMUSCULAR | Status: DC
  Administered 2024-01-29: 2 [IU] via SUBCUTANEOUS
  Administered 2024-01-29: 1 [IU] via SUBCUTANEOUS
  Administered 2024-01-29 – 2024-01-31 (×4): 2 [IU] via SUBCUTANEOUS
  Administered 2024-01-31: 1 [IU] via SUBCUTANEOUS

## 2024-01-29 MED ORDER — POTASSIUM CHLORIDE 20 MEQ PO PACK
40.0000 meq | PACK | Freq: Once | ORAL | Status: AC
  Administered 2024-01-29: 40 meq
  Filled 2024-01-29: qty 2

## 2024-01-29 MED ORDER — ENOXAPARIN SODIUM 40 MG/0.4ML IJ SOSY
40.0000 mg | PREFILLED_SYRINGE | INTRAMUSCULAR | Status: DC
  Administered 2024-01-29 – 2024-02-05 (×8): 40 mg via SUBCUTANEOUS
  Filled 2024-01-29 (×8): qty 0.4

## 2024-01-29 NOTE — Progress Notes (Signed)
 2 Days Post-Op Procedure(s) (LRB): CORONARY ARTERY BYPASS GRAFTING X 4, USING LEFT INTERNAL MAMMARY ARTERY AND RIGHT ENDOSCOPIC HARVESTED GREATER SAPHENOUS VEIN (N/A) ECHOCARDIOGRAM, TRANSESOPHAGEAL (N/A) Subjective: Intubated, responsive  Objective: Vital signs in last 24 hours: Temp:  [96.4 F (35.8 C)-97.9 F (36.6 C)] 96.6 F (35.9 C) (05/11 0700) Pulse Rate:  [71-101] 85 (05/11 0828) Cardiac Rhythm: Normal sinus rhythm (05/10 2100) Resp:  [13-28] 28 (05/11 0828) BP: (136)/(53) 136/53 (05/10 1322) SpO2:  [91 %-99 %] 98 % (05/11 0828) Arterial Line BP: (61-140)/(31-107) 99/58 (05/11 0700) FiO2 (%):  [50 %-60 %] 50 % (05/11 0828) Weight:  [69.6 kg] 69.6 kg (05/11 0442)  Hemodynamic parameters for last 24 hours: PAP: (14-37)/(5-22) 34/18 CVP:  [5 mmHg-28 mmHg] 20 mmHg CO:  [2.5 L/min-3.8 L/min] 2.5 L/min CI:  [1.6 L/min/m2-2.4 L/min/m2] 1.6 L/min/m2  Intake/Output from previous day: 05/10 0701 - 05/11 0700 In: 2887.2 [I.V.:2072.1; NG/GT:355; IV Piggyback:460.1] Out: 2303 [Urine:1685; Chest Tube:618] Intake/Output this shift: Total I/O In: 114.7 [I.V.:98.5; IV Piggyback:16.2] Out: 90 [Urine:60; Chest Tube:30]  General appearance: cooperative and no distress Neurologic: no focal deficit' Heart: regular rate and rhythm Lungs: diminished breath sounds bibasilar Abdomen: normal findings: soft, non-tender  Lab Results: Recent Labs    01/28/24 1647 01/29/24 0620 01/29/24 0727  WBC 19.8* 25.6*  --   HGB 9.9* 10.7* 9.9*  HCT 29.1* 30.8* 29.0*  PLT 112* 115*  --    BMET:  Recent Labs    01/28/24 1647 01/29/24 0620 01/29/24 0727  NA 138 136 138  K 4.0 4.0 3.5  CL 110 108  --   CO2 20* 22  --   GLUCOSE 160* 108*  --   BUN 15 13  --   CREATININE 0.93 1.01*  --   CALCIUM  7.8* 7.7*  --     PT/INR:  Recent Labs    01/27/24 1412  LABPROT 19.7*  INR 1.6*   ABG    Component Value Date/Time   PHART 7.444 01/29/2024 0727   HCO3 20.0 01/29/2024 0727   TCO2  21 (L) 01/29/2024 0727   ACIDBASEDEF 4.0 (H) 01/29/2024 0727   O2SAT 96 01/29/2024 0727   CBG (last 3)  Recent Labs    01/28/24 2123 01/28/24 2322 01/29/24 0318  GLUCAP 131* 104* 95    Assessment/Plan: S/P Procedure(s) (LRB): CORONARY ARTERY BYPASS GRAFTING X 4, USING LEFT INTERNAL MAMMARY ARTERY AND RIGHT ENDOSCOPIC HARVESTED GREATER SAPHENOUS VEIN (N/A) ECHOCARDIOGRAM, TRANSESOPHAGEAL (N/A) POD # 2 CABG NEURO- intact CV- in SR with PA 28/14,  On milrinone  0.125, epi 2, norepi 13  CI 2.02, co-ox 67  Wean epi and norepi as BP allows RESP- remains intubated  Vent per CCM, hopefully can wean and extubate today  CXR OK with some LLL atelectasis RENAL- creatinine 1.01  Start diuresing today ENDO- CBG well controlled GI- NPO, advance diet once extubated Anemia- Hgb stable SCD for DVT prophylaxis  Should be safe to start enoxaparin as well at this point   LOS: 6 days    Kaitlyn Wright 01/29/2024

## 2024-01-29 NOTE — Progress Notes (Signed)
 PT Cancellation Note  Patient Details Name: Kaitlyn Wright MRN: [id number] DOB: [birth year]   Cancelled Treatment:    Reason Eval/Treat Not Completed: Medical issues which prohibited therapy  RN reported pt able to follow commands although still intubated. Daughter present and provided home setup and pt's prior functional status. Discussed PT role and she had questions re: how pt will have someone with her for 6 weeks (surgeon told daughter that would be needed). Discussed rehab venues and from our discussion will likely recommend inpatient rehab <3 hrs therapy/day. Initiated assessment with AROM of toes and ankles when pt's RR up to 38 and tremoring of bil UEs x 3 seconds. Daughter requested no further activity and with incr RR agreed. RN made aware. Will reattempt 5/12.   Gayle Kava, PT Acute Rehabilitation Services  Office [phone]  [name] 01/29/2024, 11:05 AM

## 2024-01-29 NOTE — Progress Notes (Signed)
 Patient ID: Kaitlyn Wright, female   DOB: 09/24/1945, 78 y.o.   MRN: [id number]     Advanced Heart Failure Rounding Note  Cardiologist: None  Chief Complaint: CHF Subjective:    Patient received 60 mg IV Lasix yesterday evening with good response but still net positive for the day.    She is now on epinephrine  2, milrinone  0.125, NE 13.     She is in NSR on amiodarone 30 mg/hr.   [place]wan: RA 14 PA 30/14 CI 1.72 Co-ox 69%   Objective:   Weight Range: 69.6 kg Body mass index is 27.18 kg/m.   Vital Signs:   Temp:  [96.4 F (35.8 C)-97.9 F (36.6 C)] 96.6 F (35.9 C) (05/11 0700) Pulse Rate:  [71-101] 85 (05/11 0828) Resp:  [13-28] 28 (05/11 0828) BP: (136)/(53) 136/53 (05/10 1322) SpO2:  [91 %-99 %] 98 % (05/11 0828) Arterial Line BP: (61-140)/(31-107) 99/58 (05/11 0700) FiO2 (%):  [50 %-60 %] 50 % (05/11 0828) Weight:  [69.6 kg] 69.6 kg (05/11 0442) Last BM Date : 01/25/24  Weight change: Filed Weights   01/27/24 0654 01/28/24 0500 01/29/24 0442  Weight: 57.3 kg 68 kg 69.6 kg    Intake/Output:   Intake/Output Summary (Last 24 hours) at 01/29/2024 0926 Last data filed at 01/29/2024 0913 Gross per 24 hour  Intake 2661.43 ml  Output 2293 ml  Net 368.43 ml      Physical Exam    General:  Sedated on vent HEENT: Normal Neck: Supple. JVP 12-14. Carotids 2+ bilat; no bruits. No lymphadenopathy or thyromegaly appreciated. Cor: PMI nondisplaced. Regular rate & rhythm. No rubs, gallops or murmurs. Lungs: Decreased at bases.  Abdomen: Soft, nontender, nondistended. No hepatosplenomegaly. No bruits or masses. Good bowel sounds. Extremities: No cyanosis, clubbing, rash, edema Neuro: Sedated on vent.    Telemetry   NSR 70s (personally reviewed)  Labs    CBC Recent Labs    01/28/24 1647 01/29/24 0620 01/29/24 0727  WBC 19.8* 25.6*  --   HGB 9.9* 10.7* 9.9*  HCT 29.1* 30.8* 29.0*  MCV 91.5 92.2  --   PLT 112* 115*  --    Basic Metabolic Panel Recent Labs     01/28/24 0138 01/28/24 0552 01/28/24 1647 01/29/24 0620 01/29/24 0727  NA 140   < > 138 136 138  K 3.2*   < > 4.0 4.0 3.5  CL 108   < > 110 108  --   CO2 18*   < > 20* 22  --   GLUCOSE 208*   < > 160* 108*  --   BUN 14   < > 15 13  --   CREATININE 1.20*   < > 0.93 1.01*  --   CALCIUM  8.5*   < > 7.8* 7.7*  --   MG 2.6*  --  2.2  --   --    < > = values in this interval not displayed.   Liver Function Tests No results for input(s): "AST", "ALT", "ALKPHOS", "BILITOT", "PROT", "ALBUMIN" in the last 72 hours. No results for input(s): "LIPASE", "AMYLASE" in the last 72 hours. Cardiac Enzymes No results for input(s): "CKTOTAL", "CKMB", "CKMBINDEX", "TROPONINI" in the last 72 hours.  BNP: BNP (last 3 results) No results for input(s): "BNP" in the last 8760 hours.  ProBNP (last 3 results) No results for input(s): "PROBNP" in the last 8760 hours.   D-Dimer No results for input(s): "DDIMER" in the last 72 hours. Hemoglobin A1C No results for  input(s): "HGBA1C" in the last 72 hours. Fasting Lipid Panel No results for input(s): "CHOL", "HDL", "LDLCALC", "TRIG", "CHOLHDL", "LDLDIRECT" in the last 72 hours. Thyroid  Function Tests No results for input(s): "TSH", "T4TOTAL", "T3FREE", "THYROIDAB" in the last 72 hours.  Invalid input(s): "FREET3"  Other results:   Imaging    No results found.   Medications:     Scheduled Medications:  sodium chloride    Intravenous Once   acetaminophen   1,000 mg Oral Q6H   Or   acetaminophen  (TYLENOL ) oral liquid 160 mg/5 mL  1,000 mg Per Tube Q6H   ALPRAZolam   1 mg Per Tube QHS   aspirin  EC  325 mg Oral Daily   Or   aspirin   324 mg Per Tube Daily   atorvastatin   80 mg Per Tube q1800   bisacodyl  10 mg Oral Daily   Or   bisacodyl  10 mg Rectal Daily   Chlorhexidine  Gluconate Cloth  6 each Topical Daily   docusate  100 mg Per Tube BID   furosemide  80 mg Intravenous BID   hydrOXYzine   10 mg Per Tube QHS   insulin  aspart  0-9  Units Subcutaneous Q4H   latanoprost   1 drop Both Eyes QHS   levothyroxine   88 mcg Per Tube Q0600   metoprolol  tartrate  12.5 mg Oral BID   Or   metoprolol  tartrate  12.5 mg Per Tube BID   mouth rinse  15 mL Mouth Rinse Q2H   pantoprazole  (PROTONIX ) IV  40 mg Intravenous Q24H   polyethylene glycol  17 g Per Tube Daily   potassium chloride   40 mEq Per Tube Once   sodium chloride  flush  3 mL Intravenous Q12H   sodium chloride  HYPERTONIC  4 mL Nebulization Q4H    Infusions:  sodium chloride      amiodarone 30 mg/hr (01/29/24 0800)   calcium  gluconate in NaCl     dexmedetomidine  (PRECEDEX ) IV infusion 0.1 mcg/kg/hr (01/29/24 0800)   epinephrine  2 mcg/min (01/29/24 0905)   fentaNYL  infusion INTRAVENOUS 50 mcg/hr (01/29/24 0700)   milrinone  0.125 mcg/kg/min (01/29/24 0800)   norepinephrine  (LEVOPHED ) Adult infusion 13 mcg/min (01/29/24 0910)    PRN Medications: ALPRAZolam , calcium  gluconate in NaCl, fentaNYL , fentaNYL  (SUBLIMAZE ) injection, fentaNYL  (SUBLIMAZE ) injection, hydrOXYzine , metoprolol  tartrate, midazolam , morphine injection, naphazoline-glycerin , ondansetron  (ZOFRAN ) IV, mouth rinse, oxyCODONE, sodium chloride  flush, traMADol   Assessment/Plan   1. CAD: Patient presented with NSTEMI and cath showing diffuse coronary disease as above.  Now s/p CABG x 4.   - Continue ASA - Continue atorvastatin .  2. Post-op shock: Suspect component of vasoplegia.  Echo pre-op with EF 60-65%, normal RV.  Currently on milrinone  0.125, NE 13, epinephrine  2.  CI low but co-ox 69%.  CVP lower today at 14.  - Continue diuresis, Lasix 80 mg IV bid today and replace K.  - Wean pressors as able (SBP 120s this morning) but would keep milrinone  for now.   - Needs post-op echo to reasssess LV/RV function.  3. Acute hypoxemic respiratory failure: Slow to wean from vent, suspect volume overload plays a role.  - Diuresis as above.   CRITICAL CARE Performed by: Peder Bourdon  Total critical care time:  40 minutes  Critical care time was exclusive of separately billable procedures and treating other patients.  Critical care was necessary to treat or prevent imminent or life-threatening deterioration.  Critical care was time spent personally by me on the following activities: development of treatment plan with patient and/or surrogate as  well as nursing, discussions with consultants, evaluation of patient's response to treatment, examination of patient, obtaining history from patient or surrogate, ordering and performing treatments and interventions, ordering and review of laboratory studies, ordering and review of radiographic studies, pulse oximetry and re-evaluation of patient's condition.   Length of Stay: 6  Peder Bourdon, MD  01/29/2024, 9:26 AM  Advanced Heart Failure Team Pager [phone] (M-F; 7a - 5p)  Please contact [facility] for night-coverage after hours (5p -7a ) and weekends on [email or web address]

## 2024-01-29 NOTE — Procedures (Signed)
 Arterial Line Insertion Start/End5/07/2024 8:21 PM, 01/29/2024 8:49 PM  Patient location: ICU. Preanesthetic checklist: patient identified, IV checked, site marked, risks and benefits discussed, surgical consent, monitors and equipment checked, pre-op evaluation and timeout performed Right, radial was placed Catheter size: 20 G Hand hygiene performed , maximum sterile barriers used  and Seldinger technique used Allen's test indicative of satisfactory collateral circulation Attempts: 1 Procedure performed without using ultrasound guided technique. Following insertion, Biopatch and dressing applied. Post procedure assessment: normal  Patient tolerated the procedure well with no immediate complications.

## 2024-01-29 NOTE — Progress Notes (Signed)
 NAME:  Kaitlyn Wright, MRN:  [id number], DOB:  1945/11/18, LOS: 6 ADMISSION DATE:  01/23/2024, CONSULTATION DATE: 5/10 REFERRING MD: Kaitlyn Wright, CHIEF COMPLAINT: Postop respiratory failure  History of Present Illness:  Kaitlyn Wright is an 78 year old woman with a history of remote tobacco abuse, hypothyroidism, anxiety who presented with chest pain that she attributed to acid reflux on 5/5.  She was found to have NSTEMI.  She had had similar chest discomfort that had resolved several weeks prior to admission.  She continues to work 3 days/week and care for her 2 dogs at home.  She totally independent at baseline.  Left heart cath demonstrated three-vessel disease and she was referred for CABG.  Due to postoperative swelling in concern for bleeding that she had the operating room requiring blood transfusions she was kept intubated overnight.  Today she has required PEEP supplemental oxygen and has failed the rapid postop weaning protocol.  PCCM consulted for hypoxia.  Remote history of tobacco abuse, quit many years ago.  Not on inhalers prior to admission.  Not on oxygen prior to surgery.  LVEF 60 to 65%, normal RV.  Operative cross-clamp time 1 hour 20 minutes. Bypass time 1h 50 min. Required second short bypass run for bleeding. EBL 450cc. 3 RBC, 2 FFP, 1 platelets administered.  Pertinent  Medical History  Hypothyroidism  Significant Hospital Events: Including procedures, antibiotic start and stop dates in addition to other pertinent events   5/5 admitted, started on heparin  5/6 left heart catheterization  Interim History / Subjective:  She denies pain this morning. No acute events overnight.   Objective    Blood pressure (!) 136/53, pulse 72, temperature (!) 96.6 F (35.9 C), resp. rate (!) 22, height 5\' 3"  (1.6 m), weight 69.6 kg, SpO2 97%. PAP: (14-37)/(2-22) 34/18 CVP:  [0 mmHg-28 mmHg] 20 mmHg CO:  [2.5 L/min-3.8 L/min] 2.5 L/min CI:  [1.6 L/min/m2-2.4 L/min/m2] 1.6 L/min/m2  Vent  Mode: PRVC FiO2 (%):  [50 %-60 %] 50 % Set Rate:  [16 bmp] 16 bmp Vt Set:  [420 mL] 420 mL PEEP:  [5 cmH20] 5 cmH20 Pressure Support:  [10 cmH20] 10 cmH20 Plateau Pressure:  [8 cmH20-13 cmH20] 13 cmH20   Intake/Output Summary (Last 24 hours) at 01/29/2024 0714 Last data filed at 01/29/2024 0700 Gross per 24 hour  Intake 2887.2 ml  Output 2303 ml  Net 584.2 ml   Filed Weights   01/27/24 0654 01/28/24 0500 01/29/24 0442  Weight: 57.3 kg 68 kg 69.6 kg    Examination: General: critically ill appearing woman lying in bed in NAD HENT: [place]/AT, eyes anicteric, ETT in place Lungs: no wheezing or rhonchi, decreased left basilar breath sounds. CTA anteriorly. No significant ETT secretions.  Cardiovascular: S1S2, RRR Abdomen:  soft, NT Extremities: minimal peripheral edema, no cyanosis Neuro: RASS -2, follows commands in all extremities GU:  clear yellow urine    7.44/29/70/20 Coox 69% BMP pending WBC 25.6 H/H 10.7/30.8 Platelets 115 CXR personally reviewed> small left effusion, atelectasis left base. Chest tubes in place. Mild pulmonary edema.   Resolved Hospital Problem list:      Assessment & Plan:  Three-vessel coronary artery disease with NSTEMI, s/p CABG x4 using LIMA and RSVG Postop cardiogenic shock -post-op care per TCTS -completed post op antibiotics -pain control per protocol + PAD while on vent -aspirin , statin -holding Bblocker -coox daily -inotropes per AHF team  Post-op vent management, acute respiratory failure with hypoxia.  Suspect acute pulmonary edema and a component of shunt through  the left lower lobe atelectasis.  No concerning signs to suggest pneumonia.  Preoperatively was on room air and she has no significant airspace disease outside of left lower lobe atelectasis. -daily SAT & SBT; needs to get FiO2 down further 0needs lasix -CPT, pulmonary hygiene -con't chest tubes; still has small left effusion -needs additional diuresis  Hyperglycemia;  A1c 5.6 -insulin  gtt  ABLA- expected post-op Consumptive thrombocytopenia- expected post-op Consumptive coagulopathy- expected post-op -Status post RBCs, FFP, platelets perioperatively - no current indication for transfusions  Leukocytosis most likely reactive postop, rising but no concern for incfection. -monitor    Best Practice (right click and "Reselect all SmartList Selections" daily)   Diet/type: NPO DVT prophylaxis SCD Pressure ulcer(s): N/A GI prophylaxis: PPI Lines: Central line, Arterial Line, and yes and it is still needed Foley:  Yes, and it is still needed Code Status:  full code Last date of multidisciplinary goals of care discussion [daughter updated 5/10]  Labs   CBC: Recent Labs  Lab 01/27/24 1412 01/27/24 2005 01/27/24 2343 01/28/24 0404 01/28/24 0552 01/28/24 1647 01/29/24 0620  WBC 13.2* 11.6*  --  11.7*  --  19.8* 25.6*  HGB 11.0* 9.4* 8.5* 10.6* 9.5* 9.9* 10.7*  HCT 32.8* 28.0* 25.0* 31.1* 28.0* 29.1* 30.8*  MCV 92.1 92.7  --  92.3  --  91.5 92.2  PLT 43* 144*  --  157  --  112* 115*    Basic Metabolic Panel: Recent Labs  Lab 01/25/24 0700 01/26/24 0307 01/27/24 0802 01/27/24 1240 01/27/24 1252 01/27/24 2005 01/27/24 2343 01/28/24 0138 01/28/24 0552 01/28/24 0644 01/28/24 1647  NA 137 141   < > 139   < > 141 144 140 141 141 138  K 4.0 4.5   < > 5.0   < > 3.8 3.2* 3.2* 3.1* 3.4* 4.0  CL 105 107   < > 107  --  110  --  108  --  109 110  CO2 20* 25  --   --   --  21*  --  18*  --  19* 20*  GLUCOSE 101* 109*   < > 192*  --  235*  --  208*  --  163* 160*  BUN 23 18   < > 17  --  13  --  14  --  14 15  CREATININE 0.95 0.92   < > 0.70  --  1.08*  --  1.20*  --  1.12* 0.93  CALCIUM  9.0 9.2  --   --   --  8.4*  --  8.5*  --  8.4* 7.8*  MG 2.1 2.1  --   --   --  3.3*  --  2.6*  --   --  2.2   < > = values in this interval not displayed.   GFR: Estimated Creatinine Clearance: 45.2 mL/min (by C-G formula based on SCr of 0.93  mg/dL). Recent Labs  Lab 01/27/24 2005 01/28/24 0404 01/28/24 1647 01/29/24 0620  WBC 11.6* 11.7* 19.8* 25.6*    Liver Function Tests: Recent Labs  Lab 01/24/24 0416  AST 99*  ALT 33  ALKPHOS 66  BILITOT 1.4*  PROT 7.0  ALBUMIN 3.7     ABG    Component Value Date/Time   PHART 7.340 (L) 01/28/2024 0552   PCO2ART 33.1 01/28/2024 0552   PO2ART 74 (L) 01/28/2024 0552   HCO3 18.0 (L) 01/28/2024 0552   TCO2 19 (L) 01/28/2024 0552   ACIDBASEDEF 7.0 (H) 01/28/2024  0552   O2SAT 68.8 01/29/2024 0618     Coagulation Profile: Recent Labs  Lab 01/27/24 1412  INR 1.6*    Critical care time:     This patient is critically ill with multiple organ system failure which requires frequent high complexity decision making, assessment, support, evaluation, and titration of therapies. This was completed through the application of advanced monitoring technologies and extensive interpretation of multiple databases. During this encounter critical care time was devoted to patient care services described in this note for 39 minutes.   Joesph Mussel, DO 01/29/24 7:45 AM [facility]  For contact information, see Amion. If no response to pager, please call PCCM consult pager. After hours, 7PM- 7AM, please call Elink.

## 2024-01-29 NOTE — Progress Notes (Signed)
      [address]       [place] [address]             [phone]      Intubated  BP (!) 93/45   Pulse 88   Temp (!) 100.4 F (38 C)   Resp (!) 25   Ht 5\' 3"  (1.6 m)   Wt 69.6 kg   SpO2 96%   BMI 27.18 kg/m  28/10 CI 2.5 Milrinone  0.125, epi 4, norepi 21  Intake/Output Summary (Last 24 hours) at 01/29/2024 1941 Last data filed at 01/29/2024 1900 Gross per 24 hour  Intake 2603.35 ml  Output 3418 ml  Net -814.65 ml   Unable to extubate today Will push to extubate tomorrow  [name], MD [facility] [phone]

## 2024-01-29 NOTE — Progress Notes (Signed)
 Per d/w Dr. Mitzie Anda, round updated from team D to CHF.

## 2024-01-30 ENCOUNTER — Inpatient Hospital Stay (HOSPITAL_COMMUNITY)

## 2024-01-30 ENCOUNTER — Encounter (HOSPITAL_COMMUNITY): Payer: Self-pay | Admitting: Thoracic Surgery (Cardiothoracic Vascular Surgery)

## 2024-01-30 DIAGNOSIS — I5021 Acute systolic (congestive) heart failure: Secondary | ICD-10-CM

## 2024-01-30 DIAGNOSIS — E871 Hypo-osmolality and hyponatremia: Secondary | ICD-10-CM

## 2024-01-30 DIAGNOSIS — N179 Acute kidney failure, unspecified: Secondary | ICD-10-CM

## 2024-01-30 DIAGNOSIS — J810 Acute pulmonary edema: Secondary | ICD-10-CM

## 2024-01-30 DIAGNOSIS — I214 Non-ST elevation (NSTEMI) myocardial infarction: Secondary | ICD-10-CM | POA: Diagnosis not present

## 2024-01-30 DIAGNOSIS — Z951 Presence of aortocoronary bypass graft: Secondary | ICD-10-CM | POA: Diagnosis not present

## 2024-01-30 DIAGNOSIS — J9601 Acute respiratory failure with hypoxia: Secondary | ICD-10-CM | POA: Diagnosis not present

## 2024-01-30 LAB — BPAM RBC
Blood Product Expiration Date: 202506082359
Blood Product Expiration Date: 202506082359
Blood Product Expiration Date: 202506092359
Blood Product Expiration Date: 202506092359
Blood Product Expiration Date: 202506092359
Blood Product Expiration Date: 202506092359
Blood Product Expiration Date: 202506102359
Blood Product Expiration Date: 202506102359
Blood Product Expiration Date: 202506112359
Blood Product Expiration Date: 202506142359
ISSUE DATE / TIME: 202505090814
ISSUE DATE / TIME: 202505090814
ISSUE DATE / TIME: 202505091148
ISSUE DATE / TIME: 202505091151
ISSUE DATE / TIME: 202505091151
ISSUE DATE / TIME: 202505100115
Unit Type and Rh: 6200
Unit Type and Rh: 6200
Unit Type and Rh: 6200
Unit Type and Rh: 6200
Unit Type and Rh: 6200
Unit Type and Rh: 6200
Unit Type and Rh: 6200
Unit Type and Rh: 6200
Unit Type and Rh: 6200
Unit Type and Rh: 8400

## 2024-01-30 LAB — TYPE AND SCREEN
ABO/RH(D): AB POS
Antibody Screen: NEGATIVE
Unit division: 0
Unit division: 0
Unit division: 0
Unit division: 0
Unit division: 0
Unit division: 0
Unit division: 0
Unit division: 0
Unit division: 0
Unit division: 0

## 2024-01-30 LAB — GLUCOSE, CAPILLARY
Glucose-Capillary: 104 mg/dL — ABNORMAL HIGH (ref 70–99)
Glucose-Capillary: 121 mg/dL — ABNORMAL HIGH (ref 70–99)
Glucose-Capillary: 123 mg/dL — ABNORMAL HIGH (ref 70–99)
Glucose-Capillary: 124 mg/dL — ABNORMAL HIGH (ref 70–99)
Glucose-Capillary: 129 mg/dL — ABNORMAL HIGH (ref 70–99)
Glucose-Capillary: 150 mg/dL — ABNORMAL HIGH (ref 70–99)
Glucose-Capillary: 162 mg/dL — ABNORMAL HIGH (ref 70–99)
Glucose-Capillary: 165 mg/dL — ABNORMAL HIGH (ref 70–99)
Glucose-Capillary: 171 mg/dL — ABNORMAL HIGH (ref 70–99)
Glucose-Capillary: 175 mg/dL — ABNORMAL HIGH (ref 70–99)
Glucose-Capillary: 53 mg/dL — ABNORMAL LOW (ref 70–99)
Glucose-Capillary: 58 mg/dL — ABNORMAL LOW (ref 70–99)
Glucose-Capillary: 61 mg/dL — ABNORMAL LOW (ref 70–99)
Glucose-Capillary: 70 mg/dL (ref 70–99)
Glucose-Capillary: 71 mg/dL (ref 70–99)
Glucose-Capillary: 72 mg/dL (ref 70–99)
Glucose-Capillary: 88 mg/dL (ref 70–99)
Glucose-Capillary: 96 mg/dL (ref 70–99)
Glucose-Capillary: 100 mg/dL — ABNORMAL HIGH (ref 70–99)
Glucose-Capillary: 81 mg/dL (ref 70–99)
Glucose-Capillary: 106 mg/dL — ABNORMAL HIGH (ref 70–99)

## 2024-01-30 LAB — ECHOCARDIOGRAM COMPLETE
Height: 63 in
S' Lateral: 2.6 cm
Weight: 2388.02 [oz_av]

## 2024-01-30 LAB — CBC
HCT: 31.7 % — ABNORMAL LOW (ref 36.0–46.0)
Hemoglobin: 11 g/dL — ABNORMAL LOW (ref 12.0–15.0)
MCH: 32.2 pg (ref 26.0–34.0)
MCHC: 34.7 g/dL (ref 30.0–36.0)
MCV: 92.7 fL (ref 80.0–100.0)
Platelets: 143 10*3/uL — ABNORMAL LOW (ref 150–400)
RBC: 3.42 MIL/uL — ABNORMAL LOW (ref 3.87–5.11)
RDW: 15.1 % (ref 11.5–15.5)
WBC: 23.9 10*3/uL — ABNORMAL HIGH (ref 4.0–10.5)
nRBC: 0.1 % (ref 0.0–0.2)

## 2024-01-30 LAB — BASIC METABOLIC PANEL WITH GFR
Anion gap: 9 (ref 5–15)
BUN: 13 mg/dL (ref 8–23)
CO2: 23 mmol/L (ref 22–32)
Calcium: 7.8 mg/dL — ABNORMAL LOW (ref 8.9–10.3)
Chloride: 101 mmol/L (ref 98–111)
Creatinine, Ser: 1.07 mg/dL — ABNORMAL HIGH (ref 0.44–1.00)
GFR, Estimated: 53 mL/min — ABNORMAL LOW (ref >60)
Glucose, Bld: 175 mg/dL — ABNORMAL HIGH (ref 70–99)
Potassium: 3.7 mmol/L (ref 3.5–5.1)
Sodium: 133 mmol/L — ABNORMAL LOW (ref 135–145)

## 2024-01-30 LAB — POCT I-STAT 7, (LYTES, BLD GAS, ICA,H+H)
Acid-Base Excess: 0 mmol/L (ref 0.0–2.0)
Bicarbonate: 23.9 mmol/L (ref 20.0–28.0)
Calcium, Ion: 1.1 mmol/L — ABNORMAL LOW (ref 1.15–1.40)
HCT: 32 % — ABNORMAL LOW (ref 36.0–46.0)
Hemoglobin: 10.9 g/dL — ABNORMAL LOW (ref 12.0–15.0)
O2 Saturation: 99 %
Patient temperature: 37.6
Potassium: 4.1 mmol/L (ref 3.5–5.1)
Sodium: 134 mmol/L — ABNORMAL LOW (ref 135–145)
TCO2: 25 mmol/L (ref 22–32)
pCO2 arterial: 34.4 mmHg (ref 32–48)
pH, Arterial: 7.453 — ABNORMAL HIGH (ref 7.35–7.45)
pO2, Arterial: 117 mmHg — ABNORMAL HIGH (ref 83–108)

## 2024-01-30 MED ORDER — POTASSIUM CHLORIDE 10 MEQ/50ML IV SOLN
10.0000 meq | INTRAVENOUS | Status: AC
Start: 2024-01-30 — End: 2024-01-30
  Administered 2024-01-30 (×4): 10 meq via INTRAVENOUS
  Filled 2024-01-30 (×4): qty 50

## 2024-01-30 MED ORDER — PERFLUTREN LIPID MICROSPHERE
1.0000 mL | INTRAVENOUS | Status: AC | PRN
  Administered 2024-01-30: 2 mL via INTRAVENOUS

## 2024-01-30 MED ORDER — FUROSEMIDE 10 MG/ML IJ SOLN
40.0000 mg | Freq: Two times a day (BID) | INTRAMUSCULAR | Status: DC
  Administered 2024-01-30 (×2): 40 mg via INTRAVENOUS
  Filled 2024-01-30 (×2): qty 4

## 2024-01-30 MED ORDER — ACETAMINOPHEN 500 MG PO TABS
1000.0000 mg | ORAL_TABLET | Freq: Four times a day (QID) | ORAL | Status: DC
  Administered 2024-01-31 (×2): 1000 mg
  Filled 2024-01-30 (×2): qty 2

## 2024-01-30 MED ORDER — ACETAMINOPHEN 160 MG/5ML PO SOLN: 1000.0000 mg | Freq: Four times a day (QID) | ORAL | Status: DC

## 2024-01-30 MED ORDER — ALBUMIN HUMAN 5 % IV SOLN
12.5000 g | Freq: Once | INTRAVENOUS | Status: AC
  Administered 2024-01-30: 12.5 g via INTRAVENOUS
  Filled 2024-01-30: qty 250

## 2024-01-30 MED FILL — Sodium Bicarbonate IV Soln 8.4%: INTRAVENOUS | Qty: 50 | Status: AC

## 2024-01-30 MED FILL — Sodium Chloride IV Soln 0.9%: INTRAVENOUS | Qty: 2000 | Status: AC

## 2024-01-30 MED FILL — Heparin Sodium (Porcine) Inj 1000 Unit/ML: INTRAMUSCULAR | Qty: 30 | Status: AC

## 2024-01-30 MED FILL — Mannitol IV Soln 20%: INTRAVENOUS | Qty: 500 | Status: AC

## 2024-01-30 MED FILL — Electrolyte-R (PH 7.4) Solution: INTRAVENOUS | Qty: 4000 | Status: AC

## 2024-01-30 MED FILL — Lidocaine HCl Local Soln Prefilled Syringe 100 MG/5ML (2%): INTRAMUSCULAR | Qty: 10 | Status: AC

## 2024-01-30 NOTE — Progress Notes (Signed)
 OT Cancellation Note  Patient Details Name: Kaitlyn Wright MRN: [id number] DOB: 02-12-1946   Cancelled Treatment:    Reason Eval/Treat Not Completed: Medical issues which prohibited therapy.  Recently extubated, remains lethargic.    Hendricks Schwandt D Anjelita Sheahan 01/30/2024, 1:28 PM 01/30/2024  RP, OTR/L  Acute Rehabilitation Services  Office:  [phone]

## 2024-01-30 NOTE — Progress Notes (Signed)
 3 Days Post-Op Procedure(s) (LRB): CORONARY ARTERY BYPASS GRAFTING X 4, USING LEFT INTERNAL MAMMARY ARTERY AND RIGHT ENDOSCOPIC HARVESTED GREATER SAPHENOUS VEIN (N/A) ECHOCARDIOGRAM, TRANSESOPHAGEAL (N/A) Subjective: Intubated, following commands  Objective: Vital signs in last 24 hours: Temp:  [96.6 F (35.9 C)-100.4 F (38 C)] 99.3 F (37.4 C) (05/12 0630) Pulse Rate:  [76-92] 81 (05/12 0630) Cardiac Rhythm: Normal sinus rhythm (05/12 0400) Resp:  [13-33] 15 (05/12 0630) BP: (64-133)/(41-81) 108/49 (05/12 0600) SpO2:  [95 %-100 %] 98 % (05/12 0630) Arterial Line BP: (58-171)/(28-109) 123/43 (05/12 0630) FiO2 (%):  [40 %-50 %] 40 % (05/12 0400) Weight:  [67.7 kg] 67.7 kg (05/12 0415)  Hemodynamic parameters for last 24 hours: PAP: (22-48)/(8-33) 26/10 CVP:  [1 mmHg-28 mmHg] 10 mmHg CO:  [3.2 L/min-3.9 L/min] 3.4 L/min CI:  [1.98 L/min/m2-2.5 L/min/m2] 2.2 L/min/m2  Intake/Output from previous day: 05/11 0701 - 05/12 0700 In: 2139.5 [I.V.:2123.3; IV Piggyback:16.2] Out: 1610 [Urine:3445; Emesis/NG output:140; Chest Tube:240] Intake/Output this shift: No intake/output data recorded.  General appearance: alert, cooperative, and no distress Neurologic: intact Heart: regular rate and rhythm Lungs: diminished breath sounds bibasilar Abdomen: normal findings: soft, non-tender  Lab Results: Recent Labs    01/29/24 0620 01/29/24 0727 01/29/24 2050 01/30/24 0430  WBC 25.6*  --   --  23.9*  HGB 10.7*   < > 10.5* 11.0*  HCT 30.8*   < > 31.0* 31.7*  PLT 115*  --   --  143*   < > = values in this interval not displayed.   BMET:  Recent Labs    01/29/24 1139 01/29/24 2050 01/30/24 0430  NA 135 135 133*  K 4.1 3.7 3.7  CL 103  --  101  CO2 20*  --  23  GLUCOSE 135*  --  175*  BUN 13  --  13  CREATININE 1.05*  --  1.07*  CALCIUM  8.0*  --  7.8*    PT/INR:  Recent Labs    01/27/24 1412  LABPROT 19.7*  INR 1.6*   ABG    Component Value Date/Time   PHART  7.431 01/29/2024 2050   HCO3 19.0 (L) 01/29/2024 2050   TCO2 20 (L) 01/29/2024 2050   ACIDBASEDEF 4.0 (H) 01/29/2024 2050   O2SAT 98 01/29/2024 2050   CBG (last 3)  Recent Labs    01/29/24 1953 01/29/24 2334 01/30/24 0431  GLUCAP 185* 141* 175*    Assessment/Plan: S/P Procedure(s) (LRB): CORONARY ARTERY BYPASS GRAFTING X 4, USING LEFT INTERNAL MAMMARY ARTERY AND RIGHT ENDOSCOPIC HARVESTED GREATER SAPHENOUS VEIN (N/A) ECHOCARDIOGRAM, TRANSESOPHAGEAL (N/A) POD # 3 NEURO- intact CV- in SR with CI > 2  On milrinone  0.125, epi 4, norepi 20  Wean norepi and epi as BP allows  Dc pacing wires RESP- on vent, will attempt to wean/ extubate today  D/w CCM RENAL- creatinine stable, 1.6 L negative yesterday  Continue diuresis  Supplement K ENDO- CBG moderately elevated GI- advance diet if extubated Leukocytosis- likely from solumedrol SCD + enoxaparin Dc chest tubes Mobilize once extubated   LOS: 7 days    Kaitlyn Wright 01/30/2024

## 2024-01-30 NOTE — Progress Notes (Signed)
 NAME:  Kaitlyn Wright, MRN:  [id number], DOB:  [birth year], LOS: 7 ADMISSION DATE:  01/23/2024, CONSULTATION DATE: 5/10 REFERRING MD: Luna Salinas, CHIEF COMPLAINT: Postop respiratory failure  History of Present Illness:  Kaitlyn Wright is an 78 year old woman with a history of remote tobacco abuse, hypothyroidism, anxiety who presented with chest pain that she attributed to acid reflux on 5/5.  She was found to have NSTEMI.  She had had similar chest discomfort that had resolved several weeks prior to admission.  She continues to work 3 days/week and care for her 2 dogs at home.  She totally independent at baseline.  Left heart cath demonstrated three-vessel disease and she was referred for CABG.  Due to postoperative swelling in concern for bleeding that she had the operating room requiring blood transfusions she was kept intubated overnight.  Today she has required PEEP supplemental oxygen and has failed the rapid postop weaning protocol.  PCCM consulted for hypoxia.  Remote history of tobacco abuse, quit many years ago.  Not on inhalers prior to admission.  Not on oxygen prior to surgery.  LVEF 60 to 65%, normal RV.  Operative cross-clamp time 1 hour 20 minutes. Bypass time 1h 50 min. Required second short bypass run for bleeding. EBL 450cc. 3 RBC, 2 FFP, 1 platelets administered.  Pertinent  Medical History  Hypothyroidism  Significant Hospital Events: Including procedures, antibiotic start and stop dates in addition to other pertinent events   5/5 admitted, started on heparin  5/6 left heart catheterization  Interim History / Subjective:  Patient is tolerating spontaneous breathing trial Still on milrinone  at 0.125, epinephrine  4 and norepinephrine  16 No overnight issues  Objective    Blood pressure (!) 121/53, pulse 81, temperature 99.3 F (37.4 C), temperature source Core, resp. rate 16, height 5\' 3"  (1.6 m), weight 67.7 kg, SpO2 98%. PAP: (22-48)/(8-33) 27/11 CVP:  [1 mmHg-28 mmHg] 8  mmHg CO:  [3.2 L/min-3.9 L/min] 3.4 L/min CI:  [1.98 L/min/m2-2.5 L/min/m2] 2.14 L/min/m2  Vent Mode: CPAP;PSV FiO2 (%):  [40 %] 40 % Set Rate:  [16 bmp] 16 bmp Vt Set:  [420 mL] 420 mL PEEP:  [5 cmH20] 5 cmH20 Pressure Support:  [5 cmH20] 5 cmH20   Intake/Output Summary (Last 24 hours) at 01/30/2024 0828 Last data filed at 01/30/2024 0800 Gross per 24 hour  Intake 2027.75 ml  Output 3840 ml  Net -1812.25 ml   Filed Weights   01/28/24 0500 01/29/24 0442 01/30/24 0415  Weight: 68 kg 69.6 kg 67.7 kg    Examination: General: Crtitically ill-appearing female, orally intubated HEENT: [place]/AT, eyes anicteric.  ETT and OGT in place Neuro: Opens eyes to vocal stimuli, following simple commands Chest: Central sternotomy incision looks clean and dry, reduced air entry on left side, no wheezes or rhonchi.  Mediastinal and chest tube in place Heart: Regular rate and rhythm, no murmurs or gallops Abdomen: Soft, nondistended, bowel sounds present    Na 133, K 3.7 BUN/creatinine 13/1.07 WBC 23.9 H/H 11/31 Platelets 143 CXR personally reviewed> small left-sided pleural effusion with atelectasis, improved from yesterday  Patient Lines/Drains/Airways Status     Active Line/Drains/Airways     Name Placement date Placement time Site Days   Arterial Line 01/29/24 Right Radial 01/29/24  2021  Radial  1   NG/OG Vented/Dual Lumen Oral Marking at nare/corner of mouth 01/27/24  1900   Oral  3   Urethral Catheter Pamula Boga RN Non-latex;Double-lumen 16 Fr. 01/27/24  0759  Non-latex;Double-lumen  3   Y Chest  Tube 1, 2, and 3 1 Right Pleural 28 Fr. 2 Medial Mediastinal 36 Fr. 3 Left Pleural 28 Fr. 01/27/24  1249  -- 3   Airway 8 mm 01/27/24  0800  -- 3   Pulmonary Artery Catheter 01/27/24 Right 01/27/24  1610  -- 3   Incision (Closed) 01/27/24 Chest Other (Comment) 01/27/24  0928  -- 3   Incision (Closed) 01/27/24 Leg Right 01/27/24  0928  -- 3         Resolved Hospital Problem list:       Assessment & Plan:  Multivessel coronary artery disease with acute with NSTEMI, s/p CABG x4 Postop cardiogenic shock Acute on chronic HFpEF Continue aspirin  and statin Hold metoprolol  while patient is on pressors Continue to titrate vasopressor support, currently on 16 mics of Levophed , 4 of epinephrine  and 0.125 milrinone  Chest tube management per TCTS Continue multimodality pain management Advanced heart failure team is following  Acute respiratory failure with oxygen due to pulmonary edema Small amount of left-sided pleural effusion Continue lung protective ventilation Patient is tolerating spontaneous breathing trial Will try to extubate Monitor chest tube output  ABLA- expected post-op Consumptive thrombocytopenia due to CPB Consumptive coagulopathy- expected post-op Platelets, 7 improving Monitor H&H  Acute kidney injury due to cardiorenal syndrome Hypervolemic hyponatremia Monitor intake and output Avoid nephrotoxic agent Continue diuresis   Best Practice (right click and "Reselect all SmartList Selections" daily)   Diet/type: NPO DVT prophylaxis SCD Pressure ulcer(s): N/A GI prophylaxis: PPI Lines: Central line, Arterial Line, and yes and it is still needed Foley:  Yes, and it is still needed Code Status:  full code Last date of multidisciplinary goals of care discussion [daughter updated 5/10]  Labs   CBC: Recent Labs  Lab 01/27/24 2005 01/27/24 2343 01/28/24 0404 01/28/24 0552 01/28/24 1647 01/29/24 0620 01/29/24 0727 01/29/24 1112 01/29/24 2050 01/30/24 0430  WBC 11.6*  --  11.7*  --  19.8* 25.6*  --   --   --  23.9*  HGB 9.4*   < > 10.6*   < > 9.9* 10.7* 9.9* 9.2* 10.5* 11.0*  HCT 28.0*   < > 31.1*   < > 29.1* 30.8* 29.0* 27.0* 31.0* 31.7*  MCV 92.7  --  92.3  --  91.5 92.2  --   --   --  92.7  PLT 144*  --  157  --  112* 115*  --   --   --  143*   < > = values in this interval not displayed.    Basic Metabolic Panel: Recent Labs   Lab 01/25/24 0700 01/26/24 0307 01/27/24 0802 01/27/24 2005 01/27/24 2343 01/28/24 0138 01/28/24 9604 01/28/24 5409 01/28/24 1647 01/29/24 0620 01/29/24 0727 01/29/24 1112 01/29/24 1139 01/29/24 2050 01/30/24 0430  NA 137 141   < > 141   < > 140   < > 141 138 136 138 139 135 135 133*  K 4.0 4.5   < > 3.8   < > 3.2*   < > 3.4* 4.0 4.0 3.5 4.3 4.1 3.7 3.7  CL 105 107   < > 110  --  108  --  109 110 108  --   --  103  --  101  CO2 20* 25  --  21*  --  18*  --  19* 20* 22  --   --  20*  --  23  GLUCOSE 101* 109*   < > 235*  --  208*  --  163* 160* 108*  --   --  135*  --  175*  BUN 23 18   < > 13  --  14  --  14 15 13   --   --  13  --  13  CREATININE 0.95 0.92   < > 1.08*  --  1.20*  --  1.12* 0.93 1.01*  --   --  1.05*  --  1.07*  CALCIUM  9.0 9.2  --  8.4*  --  8.5*  --  8.4* 7.8* 7.7*  --   --  8.0*  --  7.8*  MG 2.1 2.1  --  3.3*  --  2.6*  --   --  2.2  --   --   --   --   --   --    < > = values in this interval not displayed.   GFR: Estimated Creatinine Clearance: 38.7 mL/min (A) (by C-G formula based on SCr of 1.07 mg/dL (H)). Recent Labs  Lab 01/28/24 0404 01/28/24 1647 01/29/24 0620 01/30/24 0430  WBC 11.7* 19.8* 25.6* 23.9*    Liver Function Tests: Recent Labs  Lab 01/24/24 0416  AST 99*  ALT 33  ALKPHOS 66  BILITOT 1.4*  PROT 7.0  ALBUMIN 3.7     ABG    Component Value Date/Time   PHART 7.431 01/29/2024 2050   PCO2ART 28.8 (L) 01/29/2024 2050   PO2ART 96 01/29/2024 2050   HCO3 19.0 (L) 01/29/2024 2050   TCO2 20 (L) 01/29/2024 2050   ACIDBASEDEF 4.0 (H) 01/29/2024 2050   O2SAT 98 01/29/2024 2050     Coagulation Profile: Recent Labs  Lab 01/27/24 1412  INR 1.6*    The patient is critically ill due to cardiogenic shock/acute respiratory failure with hypoxia due to pulmonary edema.  Critical care was necessary to treat or prevent imminent or life-threatening deterioration.  Critical care was time spent personally by me on the following  activities: development of treatment plan with patient and/or surrogate as well as nursing, discussions with consultants, evaluation of patient's response to treatment, examination of patient, obtaining history from patient or surrogate, ordering and performing treatments and interventions, ordering and review of laboratory studies, ordering and review of radiographic studies, pulse oximetry, re-evaluation of patient's condition and participation in multidisciplinary rounds.   During this encounter critical care time was devoted to patient care services described in this note for 39 minutes.     Trevor Fudge, MD [facility] See Amion for pager If no response to pager, please call [phone] until 7pm After 7pm, Please call E-link [phone]

## 2024-01-30 NOTE — Procedures (Signed)
 Extubation Procedure Note  Patient Details:   Name: Kaitlyn Wright DOB: 1946-06-03 MRN: [id number]   Airway Documentation:    Vent end date: 01/30/24 Vent end time: 0915   Evaluation  O2 sats: stable throughout Complications: No apparent complications Patient did tolerate procedure well. Bilateral Breath Sounds: Rhonchi, Diminished   Yes  Pt extubated per order to 4L [place]. Pt able to state name, has a weak cough, no cuff leak noted, CCM aware. No stridor to be noted.   Parley Bolls 01/30/2024, 9:16 AM

## 2024-01-30 NOTE — Progress Notes (Signed)
 Patient ID: Kaitlyn Wright, female   DOB: [birth year], 78 y.o.   MRN: [id number]     Advanced Heart Failure Rounding Note  Cardiologist: None  Chief Complaint: CHF Subjective:    POD #3 CABG Remains intubated. Awake and following commands  ON NE 14 Epi 4 and milrinone  0.125 Put out 3.8L yesterday.  Rhythm stable   PAP: (22-48)/(8-33) 27/11 CVP:  [1 mmHg-28 mmHg] 8 mmHg CO:  [3.2 L/min-3.9 L/min] 3.4 L/min CI:  [1.98 L/min/m2-2.5 L/min/m2] 2.14 L/min/m2  PAPI 1.5  Echo today EF 60-65% RV moderately HK.  Objective:   Weight Range: 67.7 kg Body mass index is 26.44 kg/m.   Vital Signs:   Temp:  [97.2 F (36.2 C)-100.4 F (38 C)] 99.3 F (37.4 C) (05/12 0800) Pulse Rate:  [80-92] 81 (05/12 0630) Resp:  [13-33] 16 (05/12 0800) BP: (64-133)/(41-81) 121/53 (05/12 0800) SpO2:  [95 %-100 %] 98 % (05/12 0814) Arterial Line BP: (58-171)/(28-69) 144/50 (05/12 0800) FiO2 (%):  [40 %] 40 % (05/12 0814) Weight:  [67.7 kg] 67.7 kg (05/12 0415) Last BM Date : 01/25/24  Weight change: Filed Weights   01/28/24 0500 01/29/24 0442 01/30/24 0415  Weight: 68 kg 69.6 kg 67.7 kg    Intake/Output:   Intake/Output Summary (Last 24 hours) at 01/30/2024 0915 Last data filed at 01/30/2024 0800 Gross per 24 hour  Intake 1937.06 ml  Output 3840 ml  Net -1902.94 ml      Physical Exam    General:  Awake on vent. Following commands HEENT: normal + ETT Neck: RIJ swan  Carotids 2+ bilat; no bruits. No lymphadenopathy or thryomegaly appreciated. Cor: Sternal wound ok Regular rate & rhythm. No rubs, gallops or murmurs. Lungs: clear Abdomen: soft, nontender, nondistended. Hypoactive bowel sounds. Extremities: no cyanosis, clubbing, rash, 1-2+ edema Neuro: awake on vent following commands    Telemetry   NSR 90s (personally reviewed)  Labs    CBC Recent Labs    01/29/24 0620 01/29/24 0727 01/29/24 2050 01/30/24 0430  WBC 25.6*  --   --  23.9*  HGB 10.7*   < > 10.5* 11.0*  HCT  30.8*   < > 31.0* 31.7*  MCV 92.2  --   --  92.7  PLT 115*  --   --  143*   < > = values in this interval not displayed.   Basic Metabolic Panel Recent Labs    78/29/56 0138 01/28/24 0552 01/28/24 1647 01/29/24 0620 01/29/24 1139 01/29/24 2050 01/30/24 0430  NA 140   < > 138   < > 135 135 133*  K 3.2*   < > 4.0   < > 4.1 3.7 3.7  CL 108   < > 110   < > 103  --  101  CO2 18*   < > 20*   < > 20*  --  23  GLUCOSE 208*   < > 160*   < > 135*  --  175*  BUN 14   < > 15   < > 13  --  13  CREATININE 1.20*   < > 0.93   < > 1.05*  --  1.07*  CALCIUM  8.5*   < > 7.8*   < > 8.0*  --  7.8*  MG 2.6*  --  2.2  --   --   --   --    < > = values in this interval not displayed.   Liver Function Tests No results for input(s): "AST", "  ALT", "ALKPHOS", "BILITOT", "PROT", "ALBUMIN" in the last 72 hours. No results for input(s): "LIPASE", "AMYLASE" in the last 72 hours. Cardiac Enzymes No results for input(s): "CKTOTAL", "CKMB", "CKMBINDEX", "TROPONINI" in the last 72 hours.  BNP: BNP (last 3 results) No results for input(s): "BNP" in the last 8760 hours.  ProBNP (last 3 results) No results for input(s): "PROBNP" in the last 8760 hours.   D-Dimer No results for input(s): "DDIMER" in the last 72 hours. Hemoglobin A1C No results for input(s): "HGBA1C" in the last 72 hours. Fasting Lipid Panel No results for input(s): "CHOL", "HDL", "LDLCALC", "TRIG", "CHOLHDL", "LDLDIRECT" in the last 72 hours. Thyroid  Function Tests No results for input(s): "TSH", "T4TOTAL", "T3FREE", "THYROIDAB" in the last 72 hours.  Invalid input(s): "FREET3"  Other results:   Imaging    No results found.   Medications:     Scheduled Medications:  sodium chloride    Intravenous Once   acetaminophen   1,000 mg Oral Q6H   Or   acetaminophen  (TYLENOL ) oral liquid 160 mg/5 mL  1,000 mg Per Tube Q6H   ALPRAZolam   1 mg Per Tube QHS   aspirin  EC  325 mg Oral Daily   Or   aspirin   324 mg Per Tube Daily    atorvastatin   80 mg Per Tube q1800   bisacodyl  10 mg Oral Daily   Or   bisacodyl  10 mg Rectal Daily   Chlorhexidine  Gluconate Cloth  6 each Topical Daily   docusate  100 mg Per Tube BID   enoxaparin (LOVENOX) injection  40 mg Subcutaneous Q24H   furosemide  40 mg Intravenous BID   hydrOXYzine   10 mg Per Tube QHS   insulin  aspart  0-9 Units Subcutaneous Q4H   latanoprost   1 drop Both Eyes QHS   levothyroxine   88 mcg Per Tube Q0600   metoprolol  tartrate  12.5 mg Oral BID   Or   metoprolol  tartrate  12.5 mg Per Tube BID   mouth rinse  15 mL Mouth Rinse Q2H   pantoprazole  (PROTONIX ) IV  40 mg Intravenous Q24H   polyethylene glycol  17 g Per Tube Daily   sodium chloride  flush  3 mL Intravenous Q12H   sodium chloride  HYPERTONIC  4 mL Nebulization Q4H    Infusions:  sodium chloride      sodium chloride      amiodarone 30 mg/hr (01/30/24 0700)   dexmedetomidine  (PRECEDEX ) IV infusion 0.5 mcg/kg/hr (01/30/24 0700)   epinephrine  4 mcg/min (01/30/24 0700)   milrinone  0.125 mcg/kg/min (01/30/24 0700)   norepinephrine  (LEVOPHED ) Adult infusion 20 mcg/min (01/30/24 0700)   potassium chloride  10 mEq (01/30/24 0829)    PRN Medications: Place/Maintain arterial line **AND** sodium chloride , ALPRAZolam , hydrOXYzine , metoprolol  tartrate, midazolam , morphine injection, naphazoline-glycerin , ondansetron  (ZOFRAN ) IV, mouth rinse, oxyCODONE, perflutren lipid microspheres (DEFINITY) IV suspension, sodium chloride  flush, traMADol   Assessment/Plan   1. CAD: Patient presented with NSTEMI and cath showing diffuse coronary disease as above.  Now s/p CABG x 4 on 01/27/24 - Continue ASA - Continue atorvastatin .  - Likely add Plavix prior to d/c 2. Post-op cardiogenic shock due to RV failure: Suspect component of vasoplegia.  Echo pre-op with EF 60-65%, normal RV.  Currently on milrinone  0.125, NE 14, epinephrine  4.   - Swan numbers most c/w RV failure - Wean NE as tolerated. Continue epi for RV  support - Gentle diuresis  - Echo today EF 60-65% RV moderately HK. Personally reviewed 3. Acute hypoxemic respiratory failure: Slow to wean from  vent, suspect volume overload plays a role.  - Plan extubation this am - D/w CCM   CRITICAL CARE Performed by: Jules Oar  Total critical care time: 45 minutes  Critical care time was exclusive of separately billable procedures and treating other patients.  Critical care was necessary to treat or prevent imminent or life-threatening deterioration.  Critical care was time spent personally by me on the following activities: development of treatment plan with patient and/or surrogate as well as nursing, discussions with consultants, evaluation of patient's response to treatment, examination of patient, obtaining history from patient or surrogate, ordering and performing treatments and interventions, ordering and review of laboratory studies, ordering and review of radiographic studies, pulse oximetry and re-evaluation of patient's condition.   Length of Stay: 7  Jules Oar, MD  01/30/2024, 9:15 AM  Advanced Heart Failure Team Pager [phone] (M-F; 7a - 5p)  Please contact [facility] for night-coverage after hours (5p -7a ) and weekends on [email or web address]

## 2024-01-30 NOTE — Progress Notes (Signed)
  Echocardiogram 2D Echocardiogram has been performed.  Fain Home RDCS 01/30/2024, 9:02 AM

## 2024-01-30 NOTE — Progress Notes (Signed)
 SLP Cancellation Note  Patient Details Name: Kaitlyn Wright MRN: [id number] DOB: 1945/11/15   Cancelled treatment:       Reason Eval/Treat Not Completed: Fatigue/lethargy limiting ability to participate. Pt has been able to take pills whole in puree, but is lethargic and seems to be in pain when swallowing. Will f/u tomorrow if pt hasn't passed an RN post extubation screen.    Prarthana Parlin, Hardin Leys 01/30/2024, 2:16 PM

## 2024-01-30 NOTE — Progress Notes (Signed)
 Patient ID: Kaitlyn Wright, female   DOB: [birth year], 78 y.o.   MRN: [id number]  TCTS Evening Rounds:  Extubated today.  Has been hemodynamically stable just on low dose epi. CI 2.17 before swan removed. Milirnone and NE off.  Diuresing well.

## 2024-01-30 NOTE — Progress Notes (Signed)
 PT Cancellation Note  Patient Details Name: Kaitlyn Wright MRN: [id number] DOB: 1946/05/29   Cancelled Treatment:    Reason Eval/Treat Not Completed: Fatigue/lethargy limiting ability to participate  Patient extubated this morning. Per RN, remains lethargic and unable to participate. Recommended PT defer until 5/13.    Gayle Kava, PT Acute Rehabilitation Services  Office [phone]  [name] 01/30/2024, 2:04 PM

## 2024-01-31 ENCOUNTER — Inpatient Hospital Stay (HOSPITAL_COMMUNITY)

## 2024-01-31 DIAGNOSIS — Z951 Presence of aortocoronary bypass graft: Secondary | ICD-10-CM | POA: Diagnosis not present

## 2024-01-31 DIAGNOSIS — I214 Non-ST elevation (NSTEMI) myocardial infarction: Secondary | ICD-10-CM | POA: Diagnosis not present

## 2024-01-31 DIAGNOSIS — J9601 Acute respiratory failure with hypoxia: Secondary | ICD-10-CM | POA: Diagnosis not present

## 2024-01-31 LAB — COOXEMETRY PANEL
Carboxyhemoglobin: 1.1 % (ref 0.5–1.5)
Methemoglobin: <0.7 % (ref 0.0–1.5)
O2 Saturation: 60.1 %
Total hemoglobin: 11.2 g/dL — ABNORMAL LOW (ref 12.0–16.0)

## 2024-01-31 LAB — CBC
HCT: 29.9 % — ABNORMAL LOW (ref 36.0–46.0)
Hemoglobin: 10.1 g/dL — ABNORMAL LOW (ref 12.0–15.0)
MCH: 31.6 pg (ref 26.0–34.0)
MCHC: 33.8 g/dL (ref 30.0–36.0)
MCV: 93.4 fL (ref 80.0–100.0)
Platelets: 154 10*3/uL (ref 150–400)
RBC: 3.2 MIL/uL — ABNORMAL LOW (ref 3.87–5.11)
RDW: 15.2 % (ref 11.5–15.5)
WBC: 19.9 10*3/uL — ABNORMAL HIGH (ref 4.0–10.5)
nRBC: 0 % (ref 0.0–0.2)

## 2024-01-31 LAB — BASIC METABOLIC PANEL WITH GFR
Anion gap: 9 (ref 5–15)
BUN: 13 mg/dL (ref 8–23)
CO2: 27 mmol/L (ref 22–32)
Calcium: 8.2 mg/dL — ABNORMAL LOW (ref 8.9–10.3)
Chloride: 99 mmol/L (ref 98–111)
Creatinine, Ser: 0.85 mg/dL (ref 0.44–1.00)
GFR, Estimated: >60 mL/min (ref >60)
Glucose, Bld: 156 mg/dL — ABNORMAL HIGH (ref 70–99)
Potassium: 3.4 mmol/L — ABNORMAL LOW (ref 3.5–5.1)
Sodium: 135 mmol/L (ref 135–145)

## 2024-01-31 LAB — GLUCOSE, CAPILLARY
Glucose-Capillary: 123 mg/dL — ABNORMAL HIGH (ref 70–99)
Glucose-Capillary: 125 mg/dL — ABNORMAL HIGH (ref 70–99)
Glucose-Capillary: 127 mg/dL — ABNORMAL HIGH (ref 70–99)
Glucose-Capillary: 163 mg/dL — ABNORMAL HIGH (ref 70–99)
Glucose-Capillary: 260 mg/dL — ABNORMAL HIGH (ref 70–99)
Glucose-Capillary: 76 mg/dL (ref 70–99)
Glucose-Capillary: 97 mg/dL (ref 70–99)

## 2024-01-31 MED ORDER — FUROSEMIDE 10 MG/ML IJ SOLN
40.0000 mg | Freq: Every day | INTRAMUSCULAR | Status: DC
  Administered 2024-01-31: 40 mg via INTRAVENOUS
  Filled 2024-01-31: qty 4

## 2024-01-31 MED ORDER — ALPRAZOLAM 0.5 MG PO TABS
1.0000 mg | ORAL_TABLET | Freq: Every day | ORAL | Status: DC
  Administered 2024-01-31 – 2024-02-03 (×4): 1 mg via ORAL
  Filled 2024-01-31 (×4): qty 2

## 2024-01-31 MED ORDER — ALPRAZOLAM 0.5 MG PO TABS: 0.5000 mg | ORAL_TABLET | Freq: Every day | ORAL | Status: DC | PRN

## 2024-01-31 MED ORDER — OXYCODONE HCL 5 MG PO TABS: 5.0000 mg | ORAL_TABLET | ORAL | Status: DC | PRN

## 2024-01-31 MED ORDER — ALBUMIN HUMAN 5 % IV SOLN
12.5000 g | Freq: Once | INTRAVENOUS | Status: AC
  Administered 2024-01-31: 12.5 g via INTRAVENOUS

## 2024-01-31 MED ORDER — HYDROXYZINE HCL 10 MG PO TABS
10.0000 mg | ORAL_TABLET | Freq: Every day | ORAL | Status: DC
  Administered 2024-01-31 – 2024-02-04 (×5): 10 mg via ORAL
  Filled 2024-01-31 (×5): qty 1

## 2024-01-31 MED ORDER — POTASSIUM CHLORIDE 20 MEQ PO PACK
20.0000 meq | PACK | Freq: Once | ORAL | Status: AC
  Administered 2024-01-31: 20 meq via ORAL
  Filled 2024-01-31: qty 1

## 2024-01-31 MED ORDER — POTASSIUM CHLORIDE 10 MEQ/50ML IV SOLN
10.0000 meq | INTRAVENOUS | Status: AC
  Administered 2024-01-31 (×4): 10 meq via INTRAVENOUS
  Filled 2024-01-31 (×4): qty 50

## 2024-01-31 MED ORDER — AMIODARONE HCL 200 MG PO TABS
400.0000 mg | ORAL_TABLET | Freq: Two times a day (BID) | ORAL | Status: DC
Start: 2024-01-31 — End: 2024-02-04
  Administered 2024-01-31 – 2024-02-03 (×8): 400 mg via ORAL
  Filled 2024-01-31 (×8): qty 2

## 2024-01-31 MED ORDER — PANTOPRAZOLE SODIUM 40 MG PO TBEC
40.0000 mg | DELAYED_RELEASE_TABLET | Freq: Every day | ORAL | Status: DC
  Administered 2024-01-31 – 2024-02-14 (×15): 40 mg via ORAL
  Filled 2024-01-31 (×15): qty 1

## 2024-01-31 MED ORDER — ALBUMIN HUMAN 5 % IV SOLN
25.0000 g | Freq: Once | INTRAVENOUS | Status: DC
  Filled 2024-01-31: qty 500

## 2024-01-31 MED ORDER — ATORVASTATIN CALCIUM 80 MG PO TABS
80.0000 mg | ORAL_TABLET | Freq: Every day | ORAL | Status: DC
  Administered 2024-01-31 – 2024-02-14 (×14): 80 mg via ORAL
  Filled 2024-01-31 (×15): qty 1

## 2024-01-31 MED ORDER — TRAMADOL HCL 50 MG PO TABS
50.0000 mg | ORAL_TABLET | ORAL | Status: DC | PRN
  Administered 2024-02-02: 50 mg via ORAL
  Administered 2024-02-03: 100 mg via ORAL
  Administered 2024-02-04: 50 mg via ORAL
  Filled 2024-01-31: qty 1
  Filled 2024-01-31: qty 2
  Filled 2024-01-31: qty 1

## 2024-01-31 MED ORDER — LEVOTHYROXINE SODIUM 88 MCG PO TABS
88.0000 ug | ORAL_TABLET | Freq: Every day | ORAL | Status: DC
  Administered 2024-02-01 – 2024-02-15 (×14): 88 ug via ORAL
  Filled 2024-01-31 (×14): qty 1

## 2024-01-31 MED ORDER — ACETAMINOPHEN 500 MG PO TABS
1000.0000 mg | ORAL_TABLET | Freq: Four times a day (QID) | ORAL | Status: AC
  Administered 2024-01-31 – 2024-02-01 (×4): 1000 mg via ORAL
  Filled 2024-01-31 (×4): qty 2

## 2024-01-31 MED ORDER — INSULIN ASPART 100 UNIT/ML IJ SOLN
0.0000 [IU] | Freq: Three times a day (TID) | INTRAMUSCULAR | Status: DC
  Administered 2024-01-31 – 2024-02-03 (×4): 2 [IU] via SUBCUTANEOUS
  Administered 2024-02-04: 3 [IU] via SUBCUTANEOUS

## 2024-01-31 MED ORDER — BOOST / RESOURCE BREEZE PO LIQD CUSTOM
1.0000 | Freq: Three times a day (TID) | ORAL | Status: DC
  Administered 2024-01-31 – 2024-02-10 (×19): 1 via ORAL

## 2024-01-31 MED ORDER — CALCIUM CHLORIDE 10 % IV SOLN
1.0000 g | Freq: Once | INTRAVENOUS | Status: AC
  Administered 2024-01-31: 1 g via INTRAVENOUS
  Filled 2024-01-31: qty 10

## 2024-01-31 MED ORDER — SIMETHICONE 80 MG PO CHEW
80.0000 mg | CHEWABLE_TABLET | Freq: Four times a day (QID) | ORAL | Status: DC | PRN
  Administered 2024-01-31 – 2024-02-03 (×2): 80 mg via ORAL
  Filled 2024-01-31 (×2): qty 1

## 2024-01-31 MED ORDER — ACETAMINOPHEN 160 MG/5ML PO SOLN
1000.0000 mg | Freq: Four times a day (QID) | ORAL | Status: AC
  Administered 2024-02-01: 1000 mg
  Filled 2024-01-31: qty 40.6

## 2024-01-31 MED ORDER — POLYETHYLENE GLYCOL 3350 17 G PO PACK
17.0000 g | PACK | Freq: Every day | ORAL | Status: DC
  Administered 2024-02-02: 17 g via ORAL
  Filled 2024-01-31 (×4): qty 1

## 2024-01-31 MED ORDER — SENNOSIDES-DOCUSATE SODIUM 8.6-50 MG PO TABS
1.0000 | ORAL_TABLET | Freq: Two times a day (BID) | ORAL | Status: DC
  Administered 2024-01-31 – 2024-02-05 (×6): 1 via ORAL
  Filled 2024-01-31 (×10): qty 1

## 2024-01-31 MED ORDER — INSULIN ASPART 100 UNIT/ML IJ SOLN: 0.0000 [IU] | Freq: Every day | INTRAMUSCULAR | Status: DC

## 2024-01-31 MED ORDER — HYDROXYZINE HCL 25 MG PO TABS: 25.0000 mg | ORAL_TABLET | Freq: Three times a day (TID) | ORAL | Status: DC | PRN

## 2024-01-31 NOTE — Discharge Summary (Addendum)
 [address]       [place] [address]             [phone]    Physician Discharge Summary  Patient ID: Marca Gadsby MRN: [id number] DOB/AGE: 10-10-1945 78 y.o.  Admit date: 01/23/2024 Discharge date: 02/15/2024  Admission Diagnoses:  Patient Active Problem List   Diagnosis Date Noted   Anxiety and depression 01/25/2024   Hypothyroidism 01/25/2024   NSTEMI (non-ST elevated myocardial infarction) (HCC) 01/23/2024   Elevated troponin 01/23/2024   Discharge Diagnoses:  Patient Active Problem List   Diagnosis Date Noted   S/P CABG x 4 01/27/2024   Anxiety and depression 01/25/2024   Hypothyroidism 01/25/2024   NSTEMI (non-ST elevated myocardial infarction) (HCC) 01/23/2024   Elevated troponin 01/23/2024   Discharged Condition: stable  History of Present Illness:  Willard Madrigal is an 78 yo female with known history of Hypothyroidism and anxiety/depression.  She presented to the Emergency Department on 5/5 with complaints of chest pain that was severe and awoke her from sleep.  She took Tylenol , ASA, and Alprazolam  without relief of symptoms.  She did admit to experiencing similar symptoms several weeks ago that did resolve and she had not experienced since.  Workup in the ED consisted of CXR which showed T wave inversion w/o evidence of ST changes.  Troponin levels were elevated.  She ultimately was chest pain free at time of admission to the hospital.  She was evaluated by Cardiology who recommended initiation of IV Heparin , Echocardiogram, and cardiac catheterization.  The patient was agreeable to proceed and she was transferred to [facility] for further workup.    Hospital Course:  She underwent catheterization by Dr. Berry Bristol on 5/6 which revealed multivessel CAD.  LAD showed several areas throughout the vessel that showed disease.  There was some concern this may not be bypassable, but he felt cardiothoracic surgery consultation was indicated.  The  patient was evaluated by Dr. Luna Salinas at which time she remained chest pain free.  He was in agreement she would benefit from coronary bypass grafting.  The risks and benefits of the procedure were explained to the patient and she was agreeable to proceed.  She was taken to the operating room on 01/27/2024.  She underwent CABG x 4 utilizing LIMA to LAD, SVG to PDA and SVG to Ramus sequential to Diagonal as well as endoscopic harvest of the right greater saphenous vein. She tolerated the procedure well and was transferred to the SICU in stable condition.  On postop day 1, she was still requiring vasopressor and inotropic support.  She was given albumin  for low filling pressures.  She continued to require ventilator support.  The critical care medicine team was consulted.  The patient was felt to have pulmonary edema contributing to ongoing respiratory insufficiency.  She was routinely diuresed with good response. She was extubated without complication on 05/12.  She was weaned off Milrinone  and Neosynephrine, but continued to require Epinephrine .  She was in NSR and transitioned to oral Amiodarone .  PT/OT consult were obtained and they recommended inpatient rehab consult.  She developed hypotension overnight and required pressor support with resumption of Epi and Norepi.  She was started on Midodrine  to help further facilitate wean.  Echocardiogram was obtained and showed preserved LV function.  Her foley catheter was removed without difficulty.  The patient was passing a large amount of water but not stool overall.  KUB was obtained and showed no evidence  of constipation.  She continued to require support with Epi.  This was later weaned on 02/03/2024.  She developed complaints of a sore throat.  Exam showed some red petechiae and ulceration/plague.  She was started on Magic mouthwash and Cepacol prn.  IV Lasix  was started by AHF to facilitate diuresis. She remained in sinus rhythm and Amiodarone  was decreased to  200 mg bid on 05/17. Accu checks and SS PRN were stopped on 05/18 as she has no history of diabetes (pre op HGA1C 5.6). Plavix  was started for hx of NSTEMI. She had a left pleural effusion, thoracentesis yielded 50cc of serous drainage. She then developed a post thoracentesis pneumothorax, IR recommended chest tube placement but the patient refused. Follow up CXR showed an improved pneumothorax and the patient remained stable without respiratory distress. She completed a 3 day course of Bactrim  for UTI. She developed worsening leukocytosis and infiltrate on CXR, IV Zosyn  was started. Her blood pressure improved and she was started on low dose Lopressor .  She continues to be significantly deconditioned but is making some progress with therapies. Left pneumothorax remained stable and oxygen was weaned as tolerated to room air. Her bowels were moving appropriately and incisions were healing well without sign of infection. She was felt stable for discharge to SNF on 02/15/24.   Consults: cardiology  Significant Diagnostic Studies:  Cardiac Catheterization 01/24/24: Hemodynamic data: LVEDP 11 mmHg.  No pressure gradient across the aortic valve.   Angiographic data: Severe diffuse coronary calcification involving the proximal LAD, ramus and CX. LM: Large-caliber vessel, has mild calcification, no significant disease. LAD: Severely diseased from the proximal, mid segment, there are multiple tandem high-grade 90% followed by 60% stenosis in the proximal and mid segment followed by a focal 90% and a tandem 80% on the apical 90% stenosis.  Gives origin to large D1 with a ostial 95% stenosis. RI: Large-caliber vessel distally however there is a long segment proximal 90% stenosis. LCx: Small vessel, has about a 30% mid stenosis. RCA: Has anterior origin and is moderately calcified in the proximal and mid segment and tortuous.  There is a tandem 30 to 40% mid stenosis followed by a high-grade focal 90% stenosis at  the crux.  There are 2 small PDA branches and a large PL branch.       Treatments: surgery:  Operative Report    DATE OF PROCEDURE: 01/31/2024   PREOPERATIVE DIAGNOSIS: Three-vessel coronary artery disease.   POSTOPERATIVE DIAGNOSIS: Three-vessel coronary artery disease.   PROCEDURE: Median sternotomy, extracorporeal circulation, coronary artery bypass grafting x4 (left internal mammary artery to LAD, saphenous vein graft to posterolateral, sequential saphenous vein graft to diagonal and ramus intermedius). Endoscopic vein  harvest right leg. Repair of right atrial tear.   SURGEON: Milon Aloe. Luna Salinas, MD.  Discharge Exam: Blood pressure (!) 120/50, pulse 64, temperature 98.3 F (36.8 C), temperature source Oral, resp. rate 20, height 5\' 3"  (1.6 m), weight 54.4 kg, SpO2 98%. General appearance: alert, cooperative, and no distress Neurologic: intact Heart: regular rate and rhythm, S1, S2 normal, no murmur, click, rub or gallop Lungs: diminished left basilar breath sounds Abdomen: soft, non-tender; bowel sounds normal; no masses,  no organomegaly Extremities: extremities normal, atraumatic, no cyanosis or edema Wound: Clean and dry without sign of infection  Discharge Medications:  The patient has been discharged on:   1.Beta Blocker:  Yes [  X]  No   [   ]                              If No, reason:  2.Ace Inhibitor/ARB: Yes [   ]                                     No  [  X  ]                                     If No, reason: Soft BP  3.Statin:   Yes [  X ]                  No  [   ]                  If No, reason:  4.Ecasa:  Yes  [  X ]                  No   [   ]                  If No, reason:  Patient had ACS upon admission: Yes  Plavix /P2Y12 inhibitor: Yes [ X  ]                                      No  [   ]     Discharge Instructions     Amb Referral to Cardiac Rehabilitation   Complete by: As directed    Diagnosis:  CABG   CABG X ___: 4   After initial evaluation and assessments completed: Virtual Based Care may be provided alone or in conjunction with Phase 2 Cardiac Rehab based on patient barriers.: Yes   Intensive Cardiac Rehabilitation (ICR) [facility] location only OR Traditional Cardiac Rehabilitation (TCR) *If criteria for ICR are not met will enroll in TCR [facility] only): Yes   Diet Heart   Complete by: As directed       Allergies as of 02/15/2024       Reactions   Naprosyn  [naproxen ]    Upset stomach   Latex Rash        Medication List     TAKE these medications    acetaminophen  325 MG tablet Commonly known as: TYLENOL  Take 2 tablets (650 mg total) by mouth every 6 (six) hours as needed for mild pain (pain score 1-3). What changed:  medication strength how much to take when to take this reasons to take this additional instructions   ALPRAZolam  1 MG tablet Commonly known as: XANAX  Take 1 tablet (1 mg total) by mouth See admin instructions. Take 1mg  (1 tablet) by mouth every night and 1mg  (1 tablet) during the day if needed.   amiodarone  200 MG tablet Commonly known as: PACERONE  Take 1 tablet (200 mg total) by mouth daily.   ascorbic acid 500 MG tablet Commonly known as: VITAMIN C Take 500 mg by mouth daily.   aspirin  EC 81 MG tablet Take 1 tablet (81 mg total) by mouth daily. Swallow whole. What changed:  when to take this additional instructions Another medication with the same name was removed. Continue  taking this medication, and follow the directions you see here.   atorvastatin  80 MG tablet Commonly known as: LIPITOR  Take 1 tablet (80 mg total) by mouth daily at 6 PM.   B-complex with vitamin C tablet Take 1 tablet by mouth daily.   cholecalciferol  1000 units tablet Commonly known as: VITAMIN D  Take 1,000 Units by mouth daily.   Cinnamon 500 MG capsule Take 500 mg by mouth daily.   clopidogrel  75 MG tablet Commonly known as: PLAVIX  Take 1 tablet (75 mg total)  by mouth daily.   Fish Oil 1000 MG Cpdr Take 1 capsule by mouth 2 (two) times daily.   hydrOXYzine  10 MG tablet Commonly known as: ATARAX  Take 10 mg by mouth at bedtime.   Krill Oil 1000 MG Caps Take 1 capsule by mouth daily.   latanoprost  0.005 % ophthalmic solution Commonly known as: XALATAN  Place 1 drop into both eyes at bedtime.   levothyroxine  88 MCG tablet Commonly known as: SYNTHROID  Take 88 mcg by mouth daily.   loperamide  1 MG/5ML solution Commonly known as: IMODIUM  Take 5 mLs by mouth daily as needed for diarrhea or loose stools.   metoprolol  tartrate 25 MG tablet Commonly known as: LOPRESSOR  Take 0.5 tablets (12.5 mg total) by mouth 2 (two) times daily.   midodrine  10 MG tablet Commonly known as: PROAMATINE  Take 1 tablet (10 mg total) by mouth 3 (three) times daily with meals.   pantoprazole  40 MG tablet Commonly known as: PROTONIX  Take 40 mg by mouth daily.   potassium chloride  SA 20 MEQ tablet Commonly known as: KLOR-CON  M Take 1 tablet every day you take Demadex  Start taking on: February 20, 2024   Red Yeast Rice 600 MG Caps Take 2 capsules by mouth 2 (two) times daily.   SYSTANE FREE OP Place 1 drop into both eyes 2 (two) times daily.   torsemide  20 MG tablet Commonly known as: DEMADEX  Take 1 tablet every Monday and Friday and take 1 tablet daily as needed for swelling or weight gain greater than 3lbs in 1 night or 5lbs in 1 week Start taking on: Feb 17, 2024   traMADol  50 MG tablet Commonly known as: ULTRAM  Take 1 tablet (50 mg total) by mouth every 6 (six) hours as needed for moderate pain (pain score 4-6).               Durable Medical Equipment  (From admission, onward)           Start     Ordered   02/03/24 1231  For home use only DME 4 wheeled rolling walker with seat  Once       Question:  Patient needs a walker to treat with the following condition  Answer:  S/P CABG x 4   02/03/24 1230            Follow-up Information      Roselind Congo, MD. Go in 20 day(s).   Specialty: Family Medicine Why: Hospital follow up appointment scheduled for Tuesday, March 06, 2024 at 12:25 PM.  PLEASE ARRIVE 10-15 minutes early.  PLEASE call to cancelreschedule if you [facility] appointment. Contact information: [address] [place] [address] [phone]         Zelphia Higashi, MD Follow up on 02/28/2024.   Specialty: Cardiothoracic Surgery Why: Appointment is at 11:00AM. Please get a chest xray at 10:00AM on the second floor of our office building Contact information: [address] [place]  [place] [address] [phone]         Leala Prince, PA-C Follow up on 03/06/2024.   Specialty: Cardiology Why: Cardiology appointment is at 10:55AM Contact information: [address] [place] [address] [phone]                 Signed:  Randa Burton, PA-C  02/15/2024, 12:21 PM

## 2024-01-31 NOTE — Progress Notes (Addendum)
 NAME:  Kaitlyn Wright, MRN:  [id number], DOB:  June 20, 1946, LOS: 8 ADMISSION DATE:  01/23/2024, CONSULTATION DATE: 5/10 REFERRING MD: Luna Salinas, CHIEF COMPLAINT: Postop respiratory failure  History of Present Illness:  Kaitlyn Wright is an 78 year old woman with a history of remote tobacco abuse, hypothyroidism, anxiety who presented with chest pain that she attributed to acid reflux on 5/5.  She was found to have NSTEMI.  She had had similar chest discomfort that had resolved several weeks prior to admission.  She continues to work 3 days/week and care for her 2 dogs at home.  She totally independent at baseline.  Left heart cath demonstrated three-vessel disease and she was referred for CABG.  Due to postoperative swelling in concern for bleeding that she had the operating room requiring blood transfusions she was kept intubated overnight.  Today she has required PEEP supplemental oxygen and has failed the rapid postop weaning protocol.  PCCM consulted for hypoxia.  Remote history of tobacco abuse, quit many years ago.  Not on inhalers prior to admission.  Not on oxygen prior to surgery.  LVEF 60 to 65%, normal RV.  Operative cross-clamp time 1 hour 20 minutes. Bypass time 1h 50 min. Required second short bypass run for bleeding. EBL 450cc. 3 RBC, 2 FFP, 1 platelets administered.  Pertinent  Medical History  Hypothyroidism  Significant Hospital Events: Including procedures, antibiotic start and stop dates in addition to other pertinent events   5/5 admitted, started on heparin  5/6 left heart catheterization  Interim History / Subjective:  Patient was successfully extubated yesterday, currently on 4 L nasal cannula oxygen Milrinone  and norepinephrine  were titrated off Still on epinephrine  at 6 mics Remained afebrile  Objective    Blood pressure (!) 85/43, pulse 90, temperature 98.5 F (36.9 C), temperature source Oral, resp. rate 17, height 5\' 3"  (1.6 m), weight 63.4 kg, SpO2 100%. PAP:  (14-35)/(4-20) 23/9 CVP:  [0 mmHg-17 mmHg] 6 mmHg CO:  [2.9 L/min-3.8 L/min] 3.8 L/min CI:  [1.82 L/min/m2-2.39 L/min/m2] 2.39 L/min/m2  FiO2 (%):  [40 %] 40 %   Intake/Output Summary (Last 24 hours) at 01/31/2024 0754 Last data filed at 01/31/2024 0700 Gross per 24 hour  Intake 1011.39 ml  Output 5005 ml  Net -3993.61 ml   Filed Weights   01/29/24 0442 01/30/24 0415 01/31/24 0500  Weight: 69.6 kg 67.7 kg 63.4 kg    Examination: General: Crtitically ill-appearing female, orally intubated HEENT: [place]/AT, eyes anicteric.  ETT and OGT in place Neuro: Opens eyes to vocal stimuli, following simple commands Chest: Central sternotomy incision looks clean and dry, reduced air entry on left side, no wheezes or rhonchi.  Mediastinal and chest tube in place Heart: Regular rate and rhythm, no murmurs or gallops Abdomen: Soft, nondistended, bowel sounds present    Na 135, K 3.4 BUN/creatinine 13/0.85 WBC 19.9  H/H 10.1/38 Platelets 154 I/O: Net -4 L in last 24 hours CXR personally reviewed myself> showing persistent small left-sided effusion with atelectasis    Patient Lines/Drains/Airways Status     Active Line/Drains/Airways     Name Placement date Placement time Site Days   Arterial Line 01/29/24 Right Radial 01/29/24  2021  Radial  2   Urethral Catheter Pamula Boga RN Non-latex;Double-lumen 16 Fr. 01/27/24  0759  Non-latex;Double-lumen  4   Incision (Closed) 01/27/24 Chest Other (Comment) 01/27/24  0928  -- 4   Incision (Closed) 01/27/24 Leg Right 01/27/24  0928  -- 4         Resolved  Hospital Problem list:    Assessment & Plan:  Multivessel coronary artery disease with acute with NSTEMI, s/p CABG x4 Postop cardiogenic shock Acute on chronic HFpEF Continue aspirin  and statin Hold metoprolol , as patient is still on epinephrine  Continue to titrate vasopressor support Continue multimodality pain management Advanced heart failure team is following She made 5 L of urine with  net -4 L in last 24 hours  Acute respiratory failure with oxygen due to pulmonary edema Small amount of left-sided pleural effusion with atelectasis Patient was successfully extubated yesterday, currently on 3 L nasal cannula oxygen Encourage incentive spirometry and ambulation Titrate oxygen with O2 sat goal 92%  ABLA- expected post-op Consumptive thrombocytopenia due to CPB Consumptive coagulopathy- expected post-op Platelets counts improved to 154 Monitor H&H  Acute kidney injury due to cardiorenal syndrome, resolved Hypervolemic hyponatremia, resolved Serum creatinine improved to 0.8 Serum sodium is at 135  Anxiety disorder Continue alprazolam    Best Practice (right click and "Reselect all SmartList Selections" daily)   Diet/type: Clear liquid diet, advance as tolerated DVT prophylaxis subcu Lovenox Pressure ulcer(s): Defer to nursing notes GI prophylaxis: PPI Lines: Central line, Arterial Line, and yes and it is still needed Foley:  Yes, and it is still needed Code Status:  full code Last date of multidisciplinary goals of care discussion [daughter updated 5/10]  Labs   CBC: Recent Labs  Lab 01/28/24 0404 01/28/24 0552 01/28/24 1647 01/29/24 0620 01/29/24 0727 01/29/24 1112 01/29/24 2050 01/30/24 0430 01/30/24 1036 01/31/24 0427  WBC 11.7*  --  19.8* 25.6*  --   --   --  23.9*  --  19.9*  HGB 10.6*   < > 9.9* 10.7*   < > 9.2* 10.5* 11.0* 10.9* 10.1*  HCT 31.1*   < > 29.1* 30.8*   < > 27.0* 31.0* 31.7* 32.0* 29.9*  MCV 92.3  --  91.5 92.2  --   --   --  92.7  --  93.4  PLT 157  --  112* 115*  --   --   --  143*  --  154   < > = values in this interval not displayed.    Basic Metabolic Panel: Recent Labs  Lab 01/25/24 0700 01/26/24 0307 01/27/24 0802 01/27/24 2005 01/27/24 2343 01/28/24 0138 01/28/24 1308 01/28/24 1647 01/29/24 0620 01/29/24 0727 01/29/24 1139 01/29/24 2050 01/30/24 0430 01/30/24 1036 01/31/24 0427  NA 137 141   < > 141   <  > 140   < > 138 136   < > 135 135 133* 134* 135  K 4.0 4.5   < > 3.8   < > 3.2*   < > 4.0 4.0   < > 4.1 3.7 3.7 4.1 3.4*  CL 105 107   < > 110  --  108   < > 110 108  --  103  --  101  --  99  CO2 20* 25  --  21*  --  18*   < > 20* 22  --  20*  --  23  --  27  GLUCOSE 101* 109*   < > 235*  --  208*   < > 160* 108*  --  135*  --  175*  --  156*  BUN 23 18   < > 13  --  14   < > 15 13  --  13  --  13  --  13  CREATININE 0.95 0.92   < >  1.08*  --  1.20*   < > 0.93 1.01*  --  1.05*  --  1.07*  --  0.85  CALCIUM  9.0 9.2  --  8.4*  --  8.5*   < > 7.8* 7.7*  --  8.0*  --  7.8*  --  8.2*  MG 2.1 2.1  --  3.3*  --  2.6*  --  2.2  --   --   --   --   --   --   --    < > = values in this interval not displayed.   GFR: Estimated Creatinine Clearance: 47.3 mL/min (by C-G formula based on SCr of 0.85 mg/dL). Recent Labs  Lab 01/28/24 1647 01/29/24 0620 01/30/24 0430 01/31/24 0427  WBC 19.8* 25.6* 23.9* 19.9*    Liver Function Tests: No results for input(s): "AST", "ALT", "ALKPHOS", "BILITOT", "PROT", "ALBUMIN" in the last 168 hours.    ABG    Component Value Date/Time   PHART 7.453 (H) 01/30/2024 1036   PCO2ART 34.4 01/30/2024 1036   PO2ART 117 (H) 01/30/2024 1036   HCO3 23.9 01/30/2024 1036   TCO2 25 01/30/2024 1036   ACIDBASEDEF 4.0 (H) 01/29/2024 2050   O2SAT 99 01/30/2024 1036     Coagulation Profile: Recent Labs  Lab 01/27/24 1412  INR 1.6*    The patient is critically ill due to cardiogenic shock/acute respiratory failure with hypoxia due to pulmonary edema.  Critical care was necessary to treat or prevent imminent or life-threatening deterioration.  Critical care was time spent personally by me on the following activities: development of treatment plan with patient and/or surrogate as well as nursing, discussions with consultants, evaluation of patient's response to treatment, examination of patient, obtaining history from patient or surrogate, ordering and performing  treatments and interventions, ordering and review of laboratory studies, ordering and review of radiographic studies, pulse oximetry, re-evaluation of patient's condition and participation in multidisciplinary rounds.   During this encounter critical care time was devoted to patient care services described in this note for 35 minutes.     Trevor Fudge, MD [facility] See Amion for pager If no response to pager, please call [phone] until 7pm After 7pm, Please call E-link [phone]

## 2024-01-31 NOTE — Progress Notes (Signed)
      [address]       [place] [address]             [phone]      Resting comfortably  BP (!) 92/48   Pulse 84   Temp 97.9 F (36.6 C) (Oral)   Resp (!) 22   Ht 5\' 3"  (1.6 m)   Wt 63.4 kg   SpO2 97%   BMI 24.76 kg/m  Was off epi but now back on at 7 mcg/min CVP 7  Intake/Output Summary (Last 24 hours) at 01/31/2024 1835 Last data filed at 01/31/2024 1600 Gross per 24 hour  Intake 758.44 ml  Output 3235 ml  Net -2476.56 ml   Suspect pressure low due to being relatively dry after aggressive diuresis the last few days  Wean as tolerated  Worked with PT/OT today  [name], MD [facility] [phone]

## 2024-01-31 NOTE — Evaluation (Signed)
 Physical Therapy Evaluation Patient Details Name: Kaitlyn Wright MRN: [id number] DOB: [birth year] Today's Date: 01/31/2024  History of Present Illness  78 year old woman  who presented with chest pain that she attributed to acid reflux on 5/5. +NSTEMI; LHC with 3v CAD; CABG 5/09; post-op cardiogenic shock and remained intubated until 5/12; Echo EF 60-65%  PMH- remote tobacco abuse, hypothyroidism, anxiety  Clinical Impression   Pt admitted secondary to problem above with deficits below. 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 Pt currently requires up to mod assist of 2 for bed mobility, up to mod assist for sit to stand, and min assist (+2 for equipment and chair follow) to ambulate 70 ft with Marlea Silvers walker. She requires up to mod assist to maintain sternal precautions with noted improvement over course of session. Anticipate patient will benefit from PT to address problems listed below. Will continue to follow acutely to maximize functional mobility, independence, and safety.  Patient will benefit from continued inpatient follow up therapy, >3 hours/day          If plan is discharge home, recommend the following: Assistance with cooking/housework;Assist for transportation   Can travel by private vehicle        Equipment Recommendations Rolling walker (2 wheels)  Recommendations for Other Services  Rehab consult    Functional Status Assessment Patient has had a recent decline in their functional status and demonstrates the ability to make significant improvements in function in a reasonable and predictable amount of time.     Precautions / Restrictions Precautions Precautions: Sternal Precaution Booklet Issued: No Recall of Precautions/Restrictions: Impaired Restrictions Other Position/Activity Restrictions: Sternal      Mobility  Bed Mobility Overal bed mobility: Needs Assistance Bed Mobility: Sit to Sidelying          Sit to sidelying: Mod assist, +2 for physical assistance General bed mobility comments: up in the recliner on arrival; return to bed after ambulation with +fatigue    Transfers Overall transfer level: Needs assistance Equipment used: None Marlea Silvers walker) Transfers: Sit to/from Stand, Bed to chair/wheelchair/BSC Sit to Stand: Mod assist, +2 safety/equipment   Step pivot transfers: Min assist, +2 safety/equipment       General transfer comment: initial stand from elevated recliner to [employer] walker with mod assist and max cues for sequencing to maintain sternal precautions (despite using heart pillow); stand-pivot from recliner to bed without device with min assist with min cues to maintain sternal precautions with heart pillow    Ambulation/Gait Ambulation/Gait assistance: Min assist (+3 equipment (IV plus chair follow)) Gait Distance (Feet): 70 Feet Assistive device: Blanca Bunch Gait Pattern/deviations: Step-through pattern, Decreased stride length, Drifts right/left   Gait velocity interpretation: 1.31 - 2.62 ft/sec, indicative of limited community ambulator   General Gait Details: pt looking down with slight trunk flexion but able to correct with repeated cues  Stairs            Wheelchair Mobility     Tilt Bed    Modified Rankin (Stroke Patients Only)       Balance Overall balance assessment: Needs assistance Sitting-balance support: Feet supported Sitting balance-Leahy Scale: Fair     Standing balance support: Bilateral upper extremity supported Standing balance-Leahy Scale: Poor                               Pertinent Vitals/Pain Pain Assessment Pain Assessment:  Faces Faces Pain Scale: Hurts little more Pain Location: sternal Pain Descriptors / Indicators: Tender Pain Intervention(s): Limited activity within patient's tolerance, Monitored during session    Home Living Family/patient expects to be discharged to:: Private residence Living  Arrangements: Alone Available Help at Discharge: Family;Available PRN/intermittently Type of Home: Other(Comment) (1 level condo) Home Access: Level entry       Home Layout: One level Home Equipment: BSC/3in1      Prior Function Prior Level of Function : Independent/Modified Independent;Working/employed;Driving               ADLs Comments: Works 3 shifts/week at [employer], walks at a [employer] 3x/wk.  No assist with ADL,iADL     Extremity/Trunk Assessment   Upper Extremity Assessment Upper Extremity Assessment: Defer to OT evaluation    Lower Extremity Assessment Lower Extremity Assessment: Generalized weakness    Cervical / Trunk Assessment Cervical / Trunk Assessment: Normal  Communication   Communication Communication: No apparent difficulties    Cognition Arousal: Alert Behavior During Therapy: WFL for tasks assessed/performed                             Following commands: Intact       Cueing Cueing Techniques: Verbal cues, Gestural cues, Tactile cues     General Comments General comments (skin integrity, edema, etc.): RN monitoring vitals/monitor throughout session with no alarms noted    Exercises     Assessment/Plan    PT Assessment Patient needs continued PT services  PT Problem List Decreased strength;Decreased activity tolerance;Decreased balance;Decreased mobility;Decreased knowledge of use of DME;Decreased knowledge of precautions;Cardiopulmonary status limiting activity;Pain       PT Treatment Interventions DME instruction;Gait training;Functional mobility training;Therapeutic activities;Therapeutic exercise;Balance training;Patient/family education    PT Goals (Current goals can be found in the Care Plan section)  Acute Rehab PT Goals Patient Stated Goal: return home PT Goal Formulation: With patient Time For Goal Achievement: 02/14/24 Potential to Achieve Goals: Good    Frequency Min 2X/week     Co-evaluation                AM-PAC PT "6 Clicks" Mobility  Outcome Measure Help needed turning from your back to your side while in a flat bed without using bedrails?: A Little Help needed moving from lying on your back to sitting on the side of a flat bed without using bedrails?: A Lot Help needed moving to and from a bed to a chair (including a wheelchair)?: Total Help needed standing up from a chair using your arms (e.g., wheelchair or bedside chair)?: Total Help needed to walk in hospital room?: Total Help needed climbing 3-5 steps with a railing? : Total 6 Click Score: 9    End of Session Equipment Utilized During Treatment: Oxygen Activity Tolerance: Patient limited by fatigue Patient left: in bed;with call bell/phone within reach;with nursing/sitter in room Nurse Communication: Mobility status PT Visit Diagnosis: Muscle weakness (generalized) (M62.81);Difficulty in walking, not elsewhere classified (R26.2)    Time: 8527-7824 PT Time Calculation (min) (ACUTE ONLY): 19 min   Charges:   PT Evaluation $PT Eval Low Complexity: 1 Low   PT General Charges $$ ACUTE PT VISIT: 1 Visit          Gayle Kava, PT Acute Rehabilitation Services  Office [phone]   [name] 01/31/2024, 11:43 AM

## 2024-01-31 NOTE — Progress Notes (Signed)

## 2024-01-31 NOTE — Progress Notes (Signed)
 4 Days Post-Op Procedure(s) (LRB): CORONARY ARTERY BYPASS GRAFTING X 4, USING LEFT INTERNAL MAMMARY ARTERY AND RIGHT ENDOSCOPIC HARVESTED GREATER SAPHENOUS VEIN (N/A) ECHOCARDIOGRAM, TRANSESOPHAGEAL (N/A) Subjective: Up in chair, some incisional pain  Objective: Vital signs in last 24 hours: Temp:  [98.5 F (36.9 C)-99.9 F (37.7 C)] 98.5 F (36.9 C) (05/13 0400) Pulse Rate:  [86-102] 90 (05/13 0300) Cardiac Rhythm: Normal sinus rhythm (05/12 2000) Resp:  [13-29] 17 (05/13 0345) BP: (85-110)/(43-65) 85/43 (05/13 0218) SpO2:  [73 %-100 %] 100 % (05/13 0345) Arterial Line BP: (80-166)/(36-78) 110/54 (05/13 0710) FiO2 (%):  [40 %] 40 % (05/12 0814) Weight:  [63.4 kg] 63.4 kg (05/13 0500)  Hemodynamic parameters for last 24 hours: PAP: (14-35)/(4-20) 23/9 CVP:  [0 mmHg-17 mmHg] 6 mmHg CO:  [2.9 L/min-3.8 L/min] 3.8 L/min CI:  [1.82 L/min/m2-2.39 L/min/m2] 2.39 L/min/m2  Intake/Output from previous day: 05/12 0701 - 05/13 0700 In: 1011.4 [I.V.:844.5; IV Piggyback:166.9] Out: 1610 [Urine:4975; Chest Tube:30] Intake/Output this shift: No intake/output data recorded.  General appearance: alert, cooperative, and no distress Neurologic: intact Heart: regular rate and rhythm Lungs: diminished breath sounds bibasilar and L>R  Lab Results: Recent Labs    01/30/24 0430 01/30/24 1036 01/31/24 0427  WBC 23.9*  --  19.9*  HGB 11.0* 10.9* 10.1*  HCT 31.7* 32.0* 29.9*  PLT 143*  --  154   BMET:  Recent Labs    01/30/24 0430 01/30/24 1036 01/31/24 0427  NA 133* 134* 135  K 3.7 4.1 3.4*  CL 101  --  99  CO2 23  --  27  GLUCOSE 175*  --  156*  BUN 13  --  13  CREATININE 1.07*  --  0.85  CALCIUM  7.8*  --  8.2*    PT/INR: No results for input(s): "LABPROT", "INR" in the last 72 hours. ABG    Component Value Date/Time   PHART 7.453 (H) 01/30/2024 1036   HCO3 23.9 01/30/2024 1036   TCO2 25 01/30/2024 1036   ACIDBASEDEF 4.0 (H) 01/29/2024 2050   O2SAT 99 01/30/2024 1036    CBG (last 3)  Recent Labs    01/30/24 1947 01/31/24 0019 01/31/24 0408  GLUCAP 106* 125* 163*    Assessment/Plan: S/P Procedure(s) (LRB): CORONARY ARTERY BYPASS GRAFTING X 4, USING LEFT INTERNAL MAMMARY ARTERY AND RIGHT ENDOSCOPIC HARVESTED GREATER SAPHENOUS VEIN (N/A) ECHOCARDIOGRAM, TRANSESOPHAGEAL (N/A) POD # 4Extubated yesterday, looks good NEURO- intact CV- in SR on amiodarone  Change amiodarone to PO  Still on epi - wean as BP tolerates RESP- stable no distress  CXR shows bibasilar atelectasis, possible elevated left hemidiaphragm  Continue IS, diuresis RENAL- creatinine normal  Diuresing well  Supplement K ENDO- CBG mildly elevated   Change SSI to Ac and HS GI - advance diet as tolerated Anemia- Hgb stable Leukocytosis- WBC trending down, likely secondary to steroids SCD + enoxaparin Deconditioning- will consult PT, OT  LOS: 8 days    Kaitlyn Wright 01/31/2024

## 2024-01-31 NOTE — TOC Initial Note (Signed)
 Transition of Care [facility]) - Initial/Assessment Note    Patient Details  Name: Kaitlyn Wright MRN: [id number] Date of Birth: 1946/06/06  Transition of Care [facility] CM/SW Contact:    Benjiman Bras, RN Phone Number: [phone] 01/31/2024, 6:25 PM  Clinical Narrative:                  TOC CM spoke to pt. Pt gave permission to speak to dtr, Concha Deed. Contacted dtr and states pt was independent pta. She was able to drive to appts. States she has contacted [facility] and wants pt to have rehab prior to going home. Gave permission to created [facility] and fax referral to SNF rehab. States pt does not have any DME in the home. Update sent to [facility] CSW to follow up on SNF, 1st choice [facility].     Expected Discharge Plan: Skilled Nursing Facility Barriers to Discharge: Continued Medical Work up   Patient Goals and CMS Choice Patient states their goals for this hospitalization and ongoing recovery are:: wants to get better CMS Medicare.gov Compare Post Acute Care list provided to:: Patient Represenative (must comment) Choice offered to / list presented to : Adult Children      Expected Discharge Plan and Services   Discharge Planning Services: CM Consult Post Acute Care Choice: Skilled Nursing Facility Living arrangements for the past 2 months: Apartment                                      Prior Living Arrangements/Services Living arrangements for the past 2 months: Apartment   Patient language and need for interpreter reviewed:: Yes Do you feel safe going back to the place where you live?: Yes      Need for Family Participation in Patient Care: Yes (Comment) Care giver support system in place?: Yes (comment)   Criminal Activity/Legal Involvement Pertinent to Current Situation/Hospitalization: No - Comment as needed  Activities of Daily Living      Permission Sought/Granted Permission sought to share information with : Case Manager, Family Supports, PCP Permission granted to  share information with : Yes, Verbal Permission Granted  Share Information with NAME: Kaitlyn Wright  Permission granted to share info w AGENCY: SNF rehab, Home Health  Permission granted to share info w Relationship: daughter  Permission granted to share info w Contact Information: [phone]  Emotional Assessment Appearance:: Appears stated age Attitude/Demeanor/Rapport: Engaged Affect (typically observed): Accepting Orientation: : Oriented to Self, Oriented to Place, Oriented to  Time, Oriented to Situation   Psych Involvement: No (comment)  Admission diagnosis:  Elevated troponin [R79.89] NSTEMI (non-ST elevated myocardial infarction) (HCC) [I21.4] S/P CABG x 4 [Z95.1] Patient Active Problem List   Diagnosis Date Noted   S/P CABG x 4 01/27/2024   Anxiety and depression 01/25/2024   Hypothyroidism 01/25/2024   NSTEMI (non-ST elevated myocardial infarction) (HCC) 01/23/2024   Elevated troponin 01/23/2024   PCP:  Roselind Congo, MD Pharmacy:   [id number] Jonette Nestle, [place] - [address] AT [facility] [address] [place] [address] Phone: [phone] Fax: [phone]     Social Drivers of Health (SDOH) Social History: SDOH Screenings   Food Insecurity: No Food Insecurity (01/25/2024)  Housing: Low Risk  (01/25/2024)  Transportation Needs: No Transportation Needs (01/25/2024)  Utilities: Not At Risk (01/25/2024)  Social Connections: Unknown (01/25/2024)  Tobacco Use: Low  Risk  (01/27/2024)   SDOH Interventions:     Readmission Risk Interventions     No data to display

## 2024-01-31 NOTE — Progress Notes (Signed)
 Patient ID: Kaitlyn Wright, female   DOB: March 29, 1946, 78 y.o.   MRN: [id number]     Advanced Heart Failure Rounding Note  Cardiologist: None  Chief Complaint: CHF Subjective:    POD #4 CABG  Extubated yesterday. Swan out  Off NE. On Epi 4 for RV support. Co-ox 60%  Feels ok. Chest sore. BM x 2  Rhythm stable   WBC 23.9 -> 19.9  Echo 5/12 EF 60-65% RV moderately HK.   Objective:   Weight Range: 63.4 kg Body mass index is 24.76 kg/m.   Vital Signs:   Temp:  [98.5 F (36.9 C)-99.9 F (37.7 C)] 98.5 F (36.9 C) (05/13 0400) Pulse Rate:  [86-215] 98 (05/13 0942) Resp:  [16-28] 21 (05/13 0942) BP: (85-135)/(43-67) 135/67 (05/13 0810) SpO2:  [73 %-100 %] 98 % (05/13 0942) Arterial Line BP: (80-166)/(36-78) 104/51 (05/13 0942) Weight:  [63.4 kg] 63.4 kg (05/13 0500) Last BM Date : 01/31/24  Weight change: Filed Weights   01/29/24 0442 01/30/24 0415 01/31/24 0500  Weight: 69.6 kg 67.7 kg 63.4 kg    Intake/Output:   Intake/Output Summary (Last 24 hours) at 01/31/2024 1108 Last data filed at 01/31/2024 0900 Gross per 24 hour  Intake 779.26 ml  Output 3460 ml  Net -2680.74 ml      Physical Exam    General:  Elderly woman sitting in chair holding pillow to chest No resp difficulty HEENT: normal Neck: supple. JVP 6 Carotids 2+ bilat; no bruits. No lymphadenopathy or thryomegaly appreciated. Cor: Sternal wound ok . Regular rate & rhythm. No rubs, gallops or murmurs. Lungs: decreased at bases Abdomen: soft, nontender, nondistended. No hepatosplenomegaly. No bruits or masses. Good bowel sounds. Extremities: no cyanosis, clubbing, rash, tr edema Neuro: alert & orientedx3, cranial nerves grossly intact. moves all 4 extremities w/o difficulty. Affect pleasant    Telemetry   NSR 90s Personally reviewed  Labs    CBC Recent Labs    01/30/24 0430 01/30/24 1036 01/31/24 0427  WBC 23.9*  --  19.9*  HGB 11.0* 10.9* 10.1*  HCT 31.7* 32.0* 29.9*  MCV 92.7  --  93.4   PLT 143*  --  154   Basic Metabolic Panel Recent Labs    95/63/87 1647 01/29/24 0620 01/30/24 0430 01/30/24 1036 01/31/24 0427  NA 138   < > 133* 134* 135  K 4.0   < > 3.7 4.1 3.4*  CL 110   < > 101  --  99  CO2 20*   < > 23  --  27  GLUCOSE 160*   < > 175*  --  156*  BUN 15   < > 13  --  13  CREATININE 0.93   < > 1.07*  --  0.85  CALCIUM  7.8*   < > 7.8*  --  8.2*  MG 2.2  --   --   --   --    < > = values in this interval not displayed.   Liver Function Tests No results for input(s): "AST", "ALT", "ALKPHOS", "BILITOT", "PROT", "ALBUMIN" in the last 72 hours. No results for input(s): "LIPASE", "AMYLASE" in the last 72 hours. Cardiac Enzymes No results for input(s): "CKTOTAL", "CKMB", "CKMBINDEX", "TROPONINI" in the last 72 hours.  BNP: BNP (last 3 results) No results for input(s): "BNP" in the last 8760 hours.  ProBNP (last 3 results) No results for input(s): "PROBNP" in the last 8760 hours.   D-Dimer No results for input(s): "DDIMER" in the last 72 hours.  Hemoglobin A1C No results for input(s): "HGBA1C" in the last 72 hours. Fasting Lipid Panel No results for input(s): "CHOL", "HDL", "LDLCALC", "TRIG", "CHOLHDL", "LDLDIRECT" in the last 72 hours. Thyroid  Function Tests No results for input(s): "TSH", "T4TOTAL", "T3FREE", "THYROIDAB" in the last 72 hours.  Invalid input(s): "FREET3"  Other results:   Imaging    DG Chest Port 1 View Result Date: 01/31/2024 CLINICAL DATA:  Status post CABG EXAM: PORTABLE CHEST 1 VIEW COMPARISON:  Jan 30, 2024 FINDINGS: Extubated, nasogastric tube removed Swan-Ganz removed Right IJ introducer remains in place Pleural chest tubes and mediastinal drains removed Residual left lower lobe opacity correlate with collapse or consolidation or combination of both with a small left pleural effusion Minimal right lower lobe hypoventilatory atelectasis Heart and mediastinum within normal limits IMPRESSION: *Extubated, nasogastric tube removed.  *Residual left lower lobe opacity correlate with collapse or consolidation or combination of both with a small left pleural effusion. Electronically Signed   By: Fredrich Jefferson M.D.   On: 01/31/2024 08:25     Medications:     Scheduled Medications:  acetaminophen   1,000 mg Oral Q6H   Or   acetaminophen  (TYLENOL ) oral liquid 160 mg/5 mL  1,000 mg Per Tube Q6H   ALPRAZolam   1 mg Oral QHS   amiodarone  400 mg Oral BID   aspirin  EC  325 mg Oral Daily   atorvastatin   80 mg Oral q1800   bisacodyl  10 mg Oral Daily   Or   bisacodyl  10 mg Rectal Daily   Chlorhexidine  Gluconate Cloth  6 each Topical Daily   enoxaparin (LOVENOX) injection  40 mg Subcutaneous Q24H   feeding supplement  1 Container Oral TID BM   furosemide  40 mg Intravenous Daily   hydrOXYzine   10 mg Oral QHS   insulin  aspart  0-15 Units Subcutaneous TID WC   insulin  aspart  0-5 Units Subcutaneous QHS   latanoprost   1 drop Both Eyes QHS   [START ON 02/01/2024] levothyroxine   88 mcg Oral Q0600   pantoprazole   40 mg Oral QHS   polyethylene glycol  17 g Oral Daily   senna-docusate  1 tablet Oral BID    Infusions:  epinephrine  3 mcg/min (01/31/24 0900)   potassium chloride  10 mEq (01/31/24 1021)    PRN Medications: ALPRAZolam , hydrOXYzine , metoprolol  tartrate, morphine injection, naphazoline-glycerin , ondansetron  (ZOFRAN ) IV, mouth rinse, oxyCODONE, traMADol   Assessment/Plan   1. CAD: Patient presented with NSTEMI and cath showing diffuse coronary disease as above.  Now s/p CABG x 4 on 01/27/24 - No s/s angina - Continue ASA - Continue atorvastatin .  - Likely add Plavix prior to d/c 2. Post-op cardiogenic shock due to RV failure: Suspect component of vasoplegia.  Echo pre-op with EF 60-65%, normal RV.  Currently on epinephrine  4.  Co-ox 60%. Wean slowly with RV failure. Follow CVP and co-ox - Gentle diuresis - Echo 5/12 EF 60-65% RV moderately HK. Personally reviewed 3. Acute hypoxemic respiratory failure:  -  Extubated 5/12 - Mobilize. Encourage IS 4. Hypokalemia - supp  CRITICAL CARE Performed by: Henriette Hesser  Total critical care time: 45 minutes  Critical care time was exclusive of separately billable procedures and treating other patients.  Critical care was necessary to treat or prevent imminent or life-threatening deterioration.  Critical care was time spent personally by me (independent of midlevel providers or residents) on the following activities: development of treatment plan with patient and/or surrogate as well as nursing, discussions with consultants, evaluation of  patient's response to treatment, examination of patient, obtaining history from patient or surrogate, ordering and performing treatments and interventions, ordering and review of laboratory studies, ordering and review of radiographic studies, pulse oximetry and re-evaluation of patient's condition.     Length of Stay: 8  Jules Oar, MD  01/31/2024, 11:08 AM  Advanced Heart Failure Team Pager [phone] (M-F; 7a - 5p)  Please contact [facility] for night-coverage after hours (5p -7a ) and weekends on [email or web address]

## 2024-01-31 NOTE — Discharge Instructions (Signed)

## 2024-01-31 NOTE — Progress Notes (Signed)
 SLP Cancellation Note  Patient Details Name: Kaitlyn Wright MRN: [id number] DOB: 02-19-1946   Cancelled treatment:   Pt currently on PO diet. Per discussion with RN, pt passed Swallow Screen last night despite it not being documented. RN observed pt with meal today with no s/s of aspiration.  Our service will sign off. If pt is observed to have any concerns for aspiration or dysphagia, please re-consult and we will return for clinical swallow exam.  RN agrees with plan.  Milina Pagett L. Beatris Lincoln, MA CCC/SLP Clinical Specialist - Acute Care SLP Acute Rehabilitation Services Office number [phone]        Myna Asal Laurice 01/31/2024, 1:40 PM

## 2024-01-31 NOTE — Progress Notes (Signed)
 Pt failed swallow screening x2 on 01/30/24. Night Shift RN progressed Pt to taking medications with pudding and reported Pt passed swallow screen & input a clear liquid diet. Progressed Pt diet to regular per Dr. Luna Salinas.

## 2024-01-31 NOTE — Evaluation (Addendum)
 Occupational Therapy Evaluation Patient Details Name: Kaitlyn Wright MRN: [id number] DOB: [birth year] Today's Date: 01/31/2024   History of Present Illness   78 year old woman adm 5/9 with a history of remote tobacco abuse, hypothyroidism, anxiety who presented with chest pain.  S/p CABGx3.     Clinical Impressions Patient admitted for the diagnosis above.  PTA she lives alone with occasional assist from her daughter who lives in [place], [place].  Patient continues to drive , work part time, and needed no assist with any aspect of ADL,iADL or mobility.  Currently she presents with deficits listed below, needing Min A for basic transfers and up to Mod A for ADL completion from a sit to stand level.  OT will follow in the acute setting to address deficits, Patient will benefit from intensive inpatient follow-up therapy, >3 hours/day to maximize functional status prior to returning home.  Patient is very independent at prior level, and should be able to reach at least Mod I level with intensive rehab.        If plan is discharge home, recommend the following:   Assist for transportation;Assistance with cooking/housework     Functional Status Assessment   Patient has had a recent decline in their functional status and demonstrates the ability to make significant improvements in function in a reasonable and predictable amount of time.     Equipment Recommendations   Tub/shower seat     Recommendations for Other Services         Precautions/Restrictions   Precautions Precautions: Sternal Precaution Booklet Issued: No Recall of Precautions/Restrictions: Impaired Restrictions Weight Bearing Restrictions Per Provider Order: Yes Other Position/Activity Restrictions: Sternal     Mobility Bed Mobility               General bed mobility comments: up in the recliner    Transfers Overall transfer level: Needs assistance   Transfers: Sit to/from Stand Sit to Stand: Min  assist                  Balance Overall balance assessment: Needs assistance Sitting-balance support: Feet supported Sitting balance-Leahy Scale: Fair     Standing balance support: Bilateral upper extremity supported Standing balance-Leahy Scale: Poor                             ADL either performed or assessed with clinical judgement   ADL       Grooming: Set up;Sitting           Upper Body Dressing : Moderate assistance;Sitting   Lower Body Dressing: Moderate assistance;Sit to/from stand   Toilet Transfer: Minimal assistance;Stand-pivot;BSC/3in1                   Vision Patient Visual Report: No change from baseline       Perception Perception: Not tested       Praxis Praxis: Not tested       Pertinent Vitals/Pain Pain Assessment Pain Assessment: Faces Faces Pain Scale: Hurts little more Pain Location: sternal Pain Descriptors / Indicators: Tender Pain Intervention(s): Monitored during session     Extremity/Trunk Assessment Upper Extremity Assessment Upper Extremity Assessment: Generalized weakness   Lower Extremity Assessment Lower Extremity Assessment: Defer to PT evaluation   Cervical / Trunk Assessment Cervical / Trunk Assessment: Normal   Communication Communication Communication: No apparent difficulties   Cognition Arousal: Alert Behavior During Therapy: WFL for tasks assessed/performed Cognition: No apparent impairments  Following commands: Intact       Cueing  General Comments       Dizziness noted with initial sit to stand.     Exercises     Shoulder Instructions      Home Living Family/patient expects to be discharged to:: Private residence Living Arrangements: Alone Available Help at Discharge: Family;Available PRN/intermittently Type of Home: Other(Comment) (1 level condo) Home Access: Level entry     Home Layout: One level     Bathroom  Shower/Tub: Producer, television/film/video: Standard Bathroom Accessibility: No   Home Equipment: BSC/3in1          Prior Functioning/Environment Prior Level of Function : Independent/Modified Independent;Working/employed;Driving               ADLs Comments: Works 3 shifts/week at [employer], walks at a [employer] 3x/wk.  No assist with ADL,iADL    OT Problem List: Decreased strength;Decreased activity tolerance;Impaired balance (sitting and/or standing);Pain   OT Treatment/Interventions: Self-care/ADL training;Therapeutic exercise;Therapeutic activities;Patient/family education;DME and/or AE instruction;Balance training      OT Goals(Current goals can be found in the care plan section)   Acute Rehab OT Goals Patient Stated Goal: Return home OT Goal Formulation: With patient Time For Goal Achievement: 02/14/24 Potential to Achieve Goals: Good ADL Goals Pt Will Perform Grooming: with modified independence;standing Pt Will Perform Upper Body Dressing: with modified independence;sitting Pt Will Perform Lower Body Dressing: with modified independence;sit to/from stand Pt Will Transfer to Toilet: with modified independence;regular height toilet;ambulating Pt/caregiver will Perform Home Exercise Program: Increased ROM;Both right and left upper extremity;With Supervision   OT Frequency:  Min 2X/week    Co-evaluation              AM-PAC OT "6 Clicks" Daily Activity     Outcome Measure Help from another person eating meals?: None Help from another person taking care of personal grooming?: None Help from another person toileting, which includes using toliet, bedpan, or urinal?: A Lot Help from another person bathing (including washing, rinsing, drying)?: A Lot Help from another person to put on and taking off regular upper body clothing?: A Lot Help from another person to put on and taking off regular lower body clothing?: A Lot 6 Click Score: 16   End of  Session Nurse Communication: Mobility status  Activity Tolerance: Patient tolerated treatment well Patient left: in chair;with call bell/phone within reach  OT Visit Diagnosis: Unsteadiness on feet (R26.81);Muscle weakness (generalized) (M62.81)                Time: 1610-9604 OT Time Calculation (min): 22 min Charges:  OT General Charges $OT Visit: 1 Visit OT Evaluation $OT Eval Moderate Complexity: 1 Mod  01/31/2024  RP, OTR/L  Acute Rehabilitation Services  Office:  [phone]   Benjamen Brand 01/31/2024, 9:52 AM

## 2024-02-01 ENCOUNTER — Inpatient Hospital Stay (HOSPITAL_COMMUNITY)

## 2024-02-01 DIAGNOSIS — I214 Non-ST elevation (NSTEMI) myocardial infarction: Secondary | ICD-10-CM | POA: Diagnosis not present

## 2024-02-01 DIAGNOSIS — I318 Other specified diseases of pericardium: Secondary | ICD-10-CM

## 2024-02-01 LAB — ECHOCARDIOGRAM LIMITED
Height: 63 in
S' Lateral: 2.4 cm
Weight: 2292.78 [oz_av]

## 2024-02-01 LAB — BASIC METABOLIC PANEL WITH GFR
Anion gap: 7 (ref 5–15)
BUN: 16 mg/dL (ref 8–23)
CO2: 26 mmol/L (ref 22–32)
Calcium: 9.1 mg/dL (ref 8.9–10.3)
Chloride: 102 mmol/L (ref 98–111)
Creatinine, Ser: 0.93 mg/dL (ref 0.44–1.00)
GFR, Estimated: >60 mL/min (ref >60)
Glucose, Bld: 165 mg/dL — ABNORMAL HIGH (ref 70–99)
Potassium: 3.7 mmol/L (ref 3.5–5.1)
Sodium: 135 mmol/L (ref 135–145)

## 2024-02-01 LAB — COOXEMETRY PANEL
Carboxyhemoglobin: 1 % (ref 0.5–1.5)
Carboxyhemoglobin: 1.3 % (ref 0.5–1.5)
Methemoglobin: <0.7 % (ref 0.0–1.5)
Methemoglobin: <0.7 % (ref 0.0–1.5)
O2 Saturation: 45.7 %
O2 Saturation: 49.8 %
Total hemoglobin: 9.9 g/dL — ABNORMAL LOW (ref 12.0–16.0)
Total hemoglobin: 10.5 g/dL — ABNORMAL LOW (ref 12.0–16.0)

## 2024-02-01 LAB — GLUCOSE, CAPILLARY
Glucose-Capillary: 160 mg/dL — ABNORMAL HIGH (ref 70–99)
Glucose-Capillary: 132 mg/dL — ABNORMAL HIGH (ref 70–99)
Glucose-Capillary: 85 mg/dL (ref 70–99)
Glucose-Capillary: 74 mg/dL (ref 70–99)
Glucose-Capillary: 198 mg/dL — ABNORMAL HIGH (ref 70–99)

## 2024-02-01 MED ORDER — NOREPINEPHRINE 4 MG/250ML-% IV SOLN
0.0000 ug/min | INTRAVENOUS | Status: DC
  Administered 2024-02-01 (×2): 5 ug/min via INTRAVENOUS
  Filled 2024-02-01 (×2): qty 250

## 2024-02-01 MED ORDER — POTASSIUM CHLORIDE 20 MEQ PO PACK
60.0000 meq | PACK | Freq: Once | ORAL | Status: AC
  Administered 2024-02-01: 60 meq via ORAL
  Filled 2024-02-01: qty 3

## 2024-02-01 MED ORDER — MIDODRINE HCL 5 MG PO TABS
10.0000 mg | ORAL_TABLET | Freq: Three times a day (TID) | ORAL | Status: DC
  Administered 2024-02-01 – 2024-02-15 (×39): 10 mg via ORAL
  Filled 2024-02-01 (×42): qty 2

## 2024-02-01 MED ORDER — FUROSEMIDE 10 MG/ML IJ SOLN
80.0000 mg | Freq: Once | INTRAMUSCULAR | Status: AC
  Administered 2024-02-01: 80 mg via INTRAVENOUS
  Filled 2024-02-01: qty 8

## 2024-02-01 NOTE — Plan of Care (Signed)
  Problem: Education: Goal: Understanding of CV disease, CV risk reduction, and recovery process will improve Outcome: Progressing   Problem: Activity: Goal: Ability to return to baseline activity level will improve Outcome: Progressing   Problem: Cardiovascular: Goal: Ability to achieve and maintain adequate cardiovascular perfusion will improve Outcome: Progressing Goal: Vascular access site(s) Level 0-1 will be maintained Outcome: Progressing   Discussed hypotension and decreased UO with Dr. Luna Salinas on two separate occasions. Each time MD visited at bedside, will continue to follow orders provided.

## 2024-02-01 NOTE — Progress Notes (Addendum)
 Patient ID: Kaitlyn Wright, female   DOB: [birth year], 78 y.o.   MRN: [id number]     Advanced Heart Failure Rounding Note  Cardiologist: None  Chief Complaint: s/p CABGx4 Subjective:    POD #5 CABG  Co-ox pending. CVP 14. On NE2 + Epi 1 2L UOP (net negative 800cc). 18lbs up from pre-op weight.   Sitting up in bed. Feeling well. No SOB, minimal pain at incision. Needs to ambulate  Objective:    Weight Range: 65 kg Body mass index is 25.38 kg/m.   Vital Signs:   Temp:  [97.9 F (36.6 C)-99 F (37.2 C)] 99 F (37.2 C) (05/14 0415) Pulse Rate:  [79-109] 94 (05/14 1045) Resp:  [13-29] 19 (05/14 1045) BP: (66-124)/(37-80) 115/48 (05/14 0830) SpO2:  [78 %-100 %] 100 % (05/14 1045) Arterial Line BP: (53-154)/(28-112) 121/47 (05/14 1045) Weight:  [65 kg] 65 kg (05/14 0703) Last BM Date : 02/01/24  Weight change: Filed Weights   01/30/24 0415 01/31/24 0500 02/01/24 0703  Weight: 67.7 kg 63.4 kg 65 kg    Intake/Output:  Intake/Output Summary (Last 24 hours) at 02/01/2024 1046 Last data filed at 02/01/2024 1000 Gross per 24 hour  Intake 1175.75 ml  Output 2040 ml  Net -864.25 ml    Physical Exam    CVP 14 General: Elderly appearing. No distress on RA Cardiac: S1 and S2 present. No murmurs or rub. Extremities: Warm and dry.  Trace BLE edema. + ted hose Neuro: Alert and oriented x3. Affect pleasant.  Lines/Devices:  RIJ introducer, foley, R radial aline  Telemetry   SR in 90-100s (personally reviewed)  Labs    CBC Recent Labs    01/30/24 0430 01/30/24 1036 01/31/24 0427  WBC 23.9*  --  19.9*  HGB 11.0* 10.9* 10.1*  HCT 31.7* 32.0* 29.9*  MCV 92.7  --  93.4  PLT 143*  --  154   Basic Metabolic Panel Recent Labs    78/29/56 0427 02/01/24 0423  NA 135 135  K 3.4* 3.7  CL 99 102  CO2 27 26  GLUCOSE 156* 165*  BUN 13 16  CREATININE 0.85 0.93  CALCIUM  8.2* 9.1   Imaging   No results found.  Medications:    Scheduled Medications:  acetaminophen    1,000 mg Oral Q6H   Or   acetaminophen  (TYLENOL ) oral liquid 160 mg/5 mL  1,000 mg Per Tube Q6H   ALPRAZolam   1 mg Oral QHS   amiodarone  400 mg Oral BID   aspirin  EC  325 mg Oral Daily   atorvastatin   80 mg Oral q1800   bisacodyl  10 mg Oral Daily   Or   bisacodyl  10 mg Rectal Daily   Chlorhexidine  Gluconate Cloth  6 each Topical Daily   enoxaparin (LOVENOX) injection  40 mg Subcutaneous Q24H   feeding supplement  1 Container Oral TID BM   furosemide  80 mg Intravenous Once   hydrOXYzine   10 mg Oral QHS   insulin  aspart  0-15 Units Subcutaneous TID WC   insulin  aspart  0-5 Units Subcutaneous QHS   latanoprost   1 drop Both Eyes QHS   levothyroxine   88 mcg Oral Q0600   midodrine  10 mg Oral TID WC   pantoprazole   40 mg Oral QHS   polyethylene glycol  17 g Oral Daily   senna-docusate  1 tablet Oral BID   Infusions:  epinephrine  1 mcg/min (02/01/24 1000)   norepinephrine  (LEVOPHED ) Adult infusion 2 mcg/min (02/01/24 1000)  PRN Medications: ALPRAZolam , hydrOXYzine , metoprolol  tartrate, morphine injection, naphazoline-glycerin , ondansetron  (ZOFRAN ) IV, mouth rinse, oxyCODONE, simethicone, traMADol  Assessment/Plan   1. CAD: Patient presented with NSTEMI and cath showing diffuse coronary disease as above.  Now s/p CABG x 4 on 01/27/24 - No s/s angina - Continue ASA + atorvastatin .  - Likely add Plavix prior to d/c  2. Post-op cardiogenic shock due to RV failure: Suspect component of vasoplegia.  Echo pre-op with EF 60-65%, normal RV.  On NE 2 + Epi 1. Co-ox pending. Wean slowly with RV failure. CVP up , 14-15 this am. Up 18lbs since pre-op weight. - Give IV Lasix 80 mg today, replace K. - echo 5/12 EF 60-65% RV moderately HK.  - repeat echo pending today  3. Acute hypoxemic respiratory failure:  - Extubated 5/12. On [place] - Mobilize. Encourage IS  4. Hypokalemia - supp  Needs to ambulate today.   CRITICAL CARE Performed by: [place] Lee  Total critical care time: 10  minutes  Critical care time was exclusive of separately billable procedures and treating other patients.  Critical care was necessary to treat or prevent imminent or life-threatening deterioration.  Critical care was time spent personally by me on the following activities: development of treatment plan with patient and/or surrogate as well as nursing, discussions with consultants, evaluation of patient's response to treatment, examination of patient, obtaining history from patient or surrogate, ordering and performing treatments and interventions, ordering and review of laboratory studies, ordering and review of radiographic studies, pulse oximetry and re-evaluation of patient's condition.  Length of Stay: 25  [place] Lee, NP  02/01/2024, 10:46 AM  Advanced Heart Failure Team Pager [phone] (M-F; 7a - 5p)  Please contact [facility] for night-coverage after hours (5p -7a ) and weekends on [email or web address]   Agree with above.  She remains in low dose epi and NE. Feeling much better but co-ox remains low (45%). CVP 15    Rhythm stable. No CP.   General:  Sitting up in bed HEENT: normal Neck: RIJ introducer Cor: Sternal wound ok Regular rate & rhythm. No rubs, gallops or murmurs. Lungs: clear Abdomen: soft, nontender, nondistended. No hepatosplenomegaly. No bruits or masses. Good bowel sounds. Extremities: no cyanosis, clubbing, rash, 1+ edema + compression  Neuro: alert & orientedx3, cranial nerves grossly intact. moves all 4 extremities w/o difficulty. Affect pleasant  Remains on inotropes for RV support. Clinically much better but co-ox still low and volume overloaded.   Will give IV lasix. Recheck co-ox. Wean inotropes slowly as tolerated. Ambulate  CRITICAL CARE Performed by: Jules Oar  Total critical care time: 42 minutes  Critical care time was exclusive of separately billable procedures and treating other patients.  Critical care was necessary to treat or prevent  imminent or life-threatening deterioration.  Critical care was time spent personally by me (independent of midlevel providers or residents) on the following activities: development of treatment plan with patient and/or surrogate as well as nursing, discussions with consultants, evaluation of patient's response to treatment, examination of patient, obtaining history from patient or surrogate, ordering and performing treatments and interventions, ordering and review of laboratory studies, ordering and review of radiographic studies, pulse oximetry and re-evaluation of patient's condition.  Jules Oar, MD  1:30 PM

## 2024-02-01 NOTE — Progress Notes (Signed)
 Inpatient Rehab Coordinator Note:  I spoke with patient's daughter over the phone to discuss CIR recommendations and goals/expectations of CIR stay.  We reviewed 3 hrs/day of therapy, physician follow up, and average length of stay 2 weeks (dependent upon progress) with goals of supervision to mod I.  We discussed caregiver support and Concha Deed states pt does not have 24/7 support at her home.  Concha Deed lives out of town ([place]).  They had discussed possibility of pt staying with Julie but she still would be alone during the day.  Concha Deed is heading to see pt today.  Concha Deed would prefer CIR if pt is a candidate.  I let her know pt was not ready to transfer out of ICU yet (still on pressor support), so we do not need a plan today. I'd like to see how pt's cognition is so will order an SLP eval for cog per protocol, and also ask rehab MD to see tomorrow morning and weigh in on possibility of mod I goals.  We will follow.   Loye Rumble, PT, DPT Admissions Coordinator [phone] 02/01/24  1:09 PM

## 2024-02-01 NOTE — Progress Notes (Signed)
 Advanced Heart Failure Progress Update  Co-ox came back at 46%. Repeat sent and is 50%. CVP 12-13. Off pressors. Continue to follow diuresis. - Restart low-dose Epi at 2 to assist with RV support.   [place] Amri Lien, NP 02/01/24  Advanced Heart Failure Team Pager [phone] (M-F; 7a - 5p)  Please contact [facility] for night-coverage after hours (4p -7a ) and weekends on [email or web address]

## 2024-02-01 NOTE — Progress Notes (Signed)
 5 Days Post-Op Procedure(s) (LRB): CORONARY ARTERY BYPASS GRAFTING X 4, USING LEFT INTERNAL MAMMARY ARTERY AND RIGHT ENDOSCOPIC HARVESTED GREATER SAPHENOUS VEIN (N/A) ECHOCARDIOGRAM, TRANSESOPHAGEAL (N/A) Subjective: Up in chair, no complaints Required epi and norepi overnight  Objective: Vital signs in last 24 hours: Temp:  [97.9 F (36.6 C)-99 F (37.2 C)] 99 F (37.2 C) (05/14 0415) Pulse Rate:  [79-187] 100 (05/14 0700) Cardiac Rhythm: Normal sinus rhythm (05/14 0430) Resp:  [13-29] 17 (05/14 0700) BP: (66-124)/(37-80) 122/50 (05/14 0700) SpO2:  [78 %-99 %] 93 % (05/14 0700) Arterial Line BP: (53-154)/(28-112) 154/112 (05/14 0700) Weight:  [65 kg] 65 kg (05/14 0703)  Hemodynamic parameters for last 24 hours: CVP:  [0 mmHg-16 mmHg] 16 mmHg  Intake/Output from previous day: 05/13 0701 - 05/14 0700 In: 1139.8 [P.O.:100; I.V.:721.2; IV Piggyback:318.7] Out: 2000 [Urine:2000] Intake/Output this shift: No intake/output data recorded.  General appearance: alert, cooperative, and no distress Neurologic: intact Heart: regular rate and rhythm Lungs: diminished breath sounds bibasilar Abdomen: normal findings: soft, non-tender  Lab Results: Recent Labs    01/30/24 0430 01/30/24 1036 01/31/24 0427  WBC 23.9*  --  19.9*  HGB 11.0* 10.9* 10.1*  HCT 31.7* 32.0* 29.9*  PLT 143*  --  154   BMET:  Recent Labs    01/31/24 0427 02/01/24 0423  NA 135 135  K 3.4* 3.7  CL 99 102  CO2 27 26  GLUCOSE 156* 165*  BUN 13 16  CREATININE 0.85 0.93  CALCIUM  8.2* 9.1    PT/INR: No results for input(s): "LABPROT", "INR" in the last 72 hours. ABG    Component Value Date/Time   PHART 7.453 (H) 01/30/2024 1036   HCO3 23.9 01/30/2024 1036   TCO2 25 01/30/2024 1036   ACIDBASEDEF 4.0 (H) 01/29/2024 2050   O2SAT 60.1 01/31/2024 0945   CBG (last 3)  Recent Labs    01/31/24 1949 01/31/24 2350 02/01/24 0426  GLUCAP 76 260* 160*    Assessment/Plan: S/P Procedure(s)  (LRB): CORONARY ARTERY BYPASS GRAFTING X 4, USING LEFT INTERNAL MAMMARY ARTERY AND RIGHT ENDOSCOPIC HARVESTED GREATER SAPHENOUS VEIN (N/A) ECHOCARDIOGRAM, TRANSESOPHAGEAL (N/A) POD # 5 NEURO- intact CV- in SR but requiring pressors for BP  CVP 14  Will check echo today to r/o pericardial effusion  Start midodrine, wean epi and norepi  ASA, statin, no beta blocker until Bp improves RESP- Continue IS RENAL- creatinine normal and lytes Ok  Will hold off on additional diuresis until BP improves ENDO- CBG elevated last night  SSI GI - tolerating diet Anemia stable Thrombocytopenia resolved Deconditioning- PT/OT/ Cardiac rehab  LOS: 9 days    Kaitlyn Wright 02/01/2024

## 2024-02-01 NOTE — Op Note (Signed)
 NAMEGRESIA, ISIDORO MEDICAL RECORD NO: [id number] ACCOUNT NO: [id number] DATE OF BIRTH: [birth year] FACILITY: [facility] LOCATION: [facility] PHYSICIAN: Milon Aloe. Luna Salinas, MD  Operative Report   DATE OF PROCEDURE: 01/31/2024  PREOPERATIVE DIAGNOSIS: Three-vessel coronary artery disease.  POSTOPERATIVE DIAGNOSIS: Three-vessel coronary artery disease.  PROCEDURE: Median sternotomy, extracorporeal circulation,  Coronary artery bypass grafting x 4  Left internal mammary artery to LAD,  Saphenous vein graft to posterolateral,  Sequential saphenous vein graft to diagonal and ramus intermedius).  Endoscopic vein harvest right leg.  Repair of right atrial tear.  SURGEON: Milon Aloe. Luna Salinas, MD.  ASSISTANT: Debroah Fanning, PA.  ANESTHESIA: General.  FINDINGS: Diffusely diseased, fair quality target vessels.  Good quality conduits. Sutures pulled through right atrium resulting in massive bleeding after separation from bypass requiring emergency resumption of bypass and repair of tear in right atrium.  CLINICAL NOTE: Kaitlyn Wright is an 78 year old woman who presented with chest pain that awakened her from her sleep. She had T-wave inversion on EKG and her troponin levels were elevated. Her pain improved with medical therapy, but at catheterization, she was found to have severe 3-vessel coronary artery disease. She was advised to undergo coronary artery bypass grafting. The indications, risks, benefits, and alternatives were discussed in detail with the patient. She understood and accepted the risks and agreed to proceed.  OPERATIVE NOTE: Ms. Cartaya was brought to the preop holding area on 01/27/2024. Anesthesia service placed a Swan-Ganz catheter and an arterial blood pressure monitoring line. She was taken to the operating room, anesthetized and intubated. A Foley catheter was placed. Intravenous antibiotics were administered. The chest, abdomen, and legs were prepped and draped in the usual sterile  fashion. Dr. Denese Finn of anesthesia performed transesophageal echocardiography. Please see his separately dictated note for full details of the procedure.  A timeout was performed. A median sternotomy was performed. The left internal mammary artery was harvested using standard technique. 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 The mammary was a good quality vessel with excellent flow when divided distally. Simultaneously, Valaria Garland made an incision in the medial aspect of the right leg at the level of the knee. The greater saphenous vein was harvested endoscopically from the upper calf to the groin. The vein was of good quality. 2000 units of heparin  was administered during the vessel harvest. The remainder of the full heparin  dose was given prior to opening the pericardium.  Once the grafts were harvested, the pericardium was opened. The ascending aorta was inspected. After confirming adequate anticoagulation with ACT measurement, the aorta was cannulated via concentric 2-0 Ethibond pledget pursestring sutures. A dual-stage venous cannula was placed via a pursestring suture in the right atrial appendage. The right atrial tissue was relatively thin. The patient was placed on bypass. Flows were maintained per protocol. She was cooled to 34 degrees Celsius. The coronary arteries were inspected and the anastomotic sites were chosen. The conduits were inspected and cut to length. A foam pad was placed in the pericardium to insulate the heart. A temperature probe was placed in the myocardial septum and a cardioplegia cannula was placed in the ascending aorta.  The aorta was cross-clamped. The left ventricle was emptied via the aortic root vent. Cardiac arrest then was achieved with a combination of cold antegrade blood cardioplegia and topical ice saline. There was a rapid diastolic arrest and there was adequate septal cooling.  A reversed saphenous vein graft was placed end-to-side to the posterolateral branch of  the right coronary. All of the coronary targets  were diffusely diseased and only fair quality vessels. It did accept a 1.5-mm probe. The vein was anastomosed end-to-side with a running 7-0 Prolene suture. Each anastomosis was probed proximally and distally at its completion before tying the suture. Cardioplegia was administered down the vein graft and there was good flow and good hemostasis.  Next, a reversed saphenous vein graft was placed sequentially to the first diagonal and the ramus intermedius. A side-to-side anastomosis was performed to the first diagonal and an end-to-side to the ramus. Both were done with running 7-0 Prolene sutures. Both anastomoses probed easily. Cardioplegia was administered down the graft and there was good flow and good hemostasis.  Additional cardioplegia was administered down the aortic root. The left internal mammary artery then was brought through a window in the pericardium. The distal end was beveled. It was anastomosed end-to-side to the distal LAD. The LAD was a very diffusely diseased vessel. It was a 1.5-mm vessel at the site of the anastomosis and a probe did pass proximally and distally for a short distance, and had relatively minimal disease at the anastomotic site.  A 1 mm probe did pass further distally.  The end-to-side anastomosis was performed with a running 8-0 Prolene suture. At the completion of the mammary to LAD anastomosis, the bulldog clamp was briefly removed to inspect for hemostasis. Rapid septal rewarming was noted. The bulldog clamp was replaced and the mammary pedicle was tacked to the epicardial surface of the heart with 6-0 Prolene sutures.  Additional cardioplegia was administered. The cardioplegia cannula then was removed from the ascending aorta. The proximal vein graft anastomoses were performed to 4.5-mm punch aortotomies with running 6-0 Prolene sutures. At the completion of the final proximal anastomosis, the patient was placed in  Trendelenburg position. Lidocaine  was administered. The aortic root was de-aired and the aortic cross-clamp was removed. The total cross-clamp time was 66 minutes.  While rewarming was completed, all proximal and distal anastomoses were inspected for hemostasis. Epicardial pacing wires were placed on the right ventricle and right atrium. When the patient had rewarmed to a core temperature of 37 degrees Celsius, she was weaned from cardiopulmonary bypass. She weaned without difficulty. The right atrial cannula was removed and the suture tore through as it was being tied.  There was massive bleeding. The cannula was rapidly placed back into the right atrial appendage and the  patient was placed back on cardiopulmonary bypass. There were some issues with airlocks, which were rapidly dealt with.  A 2-0 Ethibond suture with multiple pledgets was used as a pursestring for the right atrial appendage.  Once the bleeding was controlled with placement of the pursestring, the patient was allowed to rest on bypass. She had had a period of significant hypotension and at times during the reinstitution of bypass relatively low flows due to airlocks. The patient was resuscitated with blood products. Milrinone , epinephrine , and norepinephrine  infusions were initiated. The patient then was weaned from cardiopulmonary bypass. The total bypass time was 135 minutes.   This time with removal of the atrial cannula, there was some bleeding around the needle holes and additional 4-0 Prolene pledgetted sutures were placed as needed.  A test dose of protamine  was administered. Evarrest was applied to the right atrium. After the test dose was tolerated, the aortic cannula was removed. The remainder of the protamine  was administered without incident. There was good hemostasis at the aortic cannulation site. The chest was copiously irrigated with saline. Hemostasis was achieved. The pericardium was reapproximated  over the ascending aorta  and base of the heart with interrupted 3-0 sutures. Blake drains were placed in both pleural spaces and a mediastinal chest tube was placed through a separate incision. All were secured with #1 silk sutures. The sternum was closed with a combination of single and double heavy gauge stainless steel wire. Pectoralis fascia and subcutaneous tissue and skin were closed in standard fashion. All sponge, needle, and instrument counts were correct at the end of the procedure. The patient was taken from the operating room to the surgical intensive care unit, intubated and in critical condition.  Experienced assistance was necessary for this case. Debroah Fanning assisted with independently harvesting the saphenous vein and closing the leg incisions and then providing exposure, retraction of delicate tissue, suture management, and suctioning during the anastomoses.     SUJ D: 01/31/2024 5:31:29 pm T: 02/01/2024 6:01:00 am  JOB: [id number]/ [id number]

## 2024-02-01 NOTE — Progress Notes (Signed)
 Patient ID: Kaitlyn Wright, female   DOB: [birth year], 78 y.o.   MRN: [id number]  TCTS Evening Rounds:  Hemodynamically stable. Weaned off Epi today and Co-ox dropped to 45.7. Repeat 49.8 and CVP increased to 13 so put back on low dose Epi for RV support.  Ambulated short distance in hall and has been up to chair for several hours.  UO good.

## 2024-02-01 NOTE — NC FL2 (Signed)
 [place]  MEDICAID FL2 LEVEL OF CARE FORM     IDENTIFICATION  Patient Name: Kaitlyn Wright Birthdate: 03-Nov-1945 Sex: female Admission Date (Current Location): 01/23/2024  [facility] and [place] Number:  Producer, television/film/video and Address:  [facility] [place], [address] [place], [place] [address]      Provider Number: [id number]  Attending Physician Name and Address:  Zelphia Higashi, MD  Relative Name and Phone Number:  Albesa Alter Daughter [phone]    Current Level of Care: Hospital Recommended Level of Care: Skilled Nursing Facility Prior Approval Number:    Date Approved/Denied:   PASRR Number: [id number]  Discharge Plan: SNF    Current Diagnoses: Patient Active Problem List   Diagnosis Date Noted   S/P CABG x 4 01/27/2024   Anxiety and depression 01/25/2024   Hypothyroidism 01/25/2024   NSTEMI (non-ST elevated myocardial infarction) (HCC) 01/23/2024   Elevated troponin 01/23/2024    Orientation RESPIRATION BLADDER Height & Weight     Self, Time, Situation, Place  O2 Continent Weight: 143 lb 4.8 oz (65 kg) Height:  5\' 3"  (160 cm)  BEHAVIORAL SYMPTOMS/MOOD NEUROLOGICAL BOWEL NUTRITION STATUS      Continent Diet (See dc summary)  AMBULATORY STATUS COMMUNICATION OF NEEDS Skin   Limited Assist Verbally PU Stage and Appropriate Care (Incision (Closed) 01/27/24 Leg Right; Incision (Closed) 01/27/24 Chest)                       Personal Care Assistance Level of Assistance  Bathing, Feeding, Dressing Bathing Assistance: Maximum assistance Feeding assistance: Limited assistance Dressing Assistance: Limited assistance     Functional Limitations Info  Sight, Hearing, Speech Sight Info: Impaired Hearing Info: Adequate Speech Info: Adequate    SPECIAL CARE FACTORS FREQUENCY  PT (By licensed PT), OT (By licensed OT)     PT Frequency: 5x weekly OT Frequency: 5x weekly            Contractures      Additional Factors Info   Code Status, Allergies Code Status Info: Full Allergies Info: Naprosyn  (naproxen );Latex           Current Medications (02/01/2024):  This is the current hospital active medication list Current Facility-Administered Medications  Medication Dose Route Frequency Provider Last Rate Last Admin   acetaminophen  (TYLENOL ) tablet 1,000 mg  1,000 mg Oral Q6H Zelphia Higashi, MD   1,000 mg at 02/01/24 8469   Or   acetaminophen  (TYLENOL ) 160 MG/5ML solution 1,000 mg  1,000 mg Per Tube Q6H Zelphia Higashi, MD       ALPRAZolam  (XANAX ) tablet 0.5 mg  0.5 mg Oral Daily PRN Zelphia Higashi, MD       ALPRAZolam  (XANAX ) tablet 1 mg  1 mg Oral QHS Zelphia Higashi, MD   1 mg at 01/31/24 2132   amiodarone (PACERONE) tablet 400 mg  400 mg Oral BID Zelphia Higashi, MD   400 mg at 02/01/24 1009   aspirin  EC tablet 325 mg  325 mg Oral Daily Randa Burton, PA-C   325 mg at 02/01/24 1009   atorvastatin  (LIPITOR ) tablet 80 mg  80 mg Oral q1800 Hendrickson, Steven C, MD   80 mg at 01/31/24 1720   bisacodyl (DULCOLAX) EC tablet 10 mg  10 mg Oral Daily Stehler, Barba Bonnet, PA-C       Or   bisacodyl (DULCOLAX) suppository 10 mg  10 mg Rectal Daily Randa Burton, PA-C   10  mg at 01/29/24 0914   Chlorhexidine  Gluconate Cloth 2 % PADS 6 each  6 each Topical Daily Zelphia Higashi, MD   6 each at 02/01/24 0930   enoxaparin (LOVENOX) injection 40 mg  40 mg Subcutaneous Q24H Zelphia Higashi, MD   40 mg at 02/01/24 1009   EPINEPHrine  (ADRENALIN ) 5 mg in NS 250 mL (0.02 mg/mL) premix infusion  0-20 mcg/min Intravenous Titrated Zelphia Higashi, MD 6 mL/hr at 02/01/24 0900 2 mcg/min at 02/01/24 0900   feeding supplement (BOOST / RESOURCE BREEZE) liquid 1 Container  1 Container Oral TID BM Hendrickson, Steven C, MD   1 Container at 01/31/24 2000   hydrOXYzine  (ATARAX ) tablet 10 mg  10 mg Oral QHS Zelphia Higashi, MD   10 mg at 01/31/24 2142   hydrOXYzine  (ATARAX ) tablet 25  mg  25 mg Oral TID PRN Zelphia Higashi, MD       insulin  aspart (novoLOG) injection 0-15 Units  0-15 Units Subcutaneous TID WC Hendrickson, Steven C, MD   2 Units at 02/01/24 1010   insulin  aspart (novoLOG) injection 0-5 Units  0-5 Units Subcutaneous QHS Zelphia Higashi, MD       latanoprost  (XALATAN ) 0.005 % ophthalmic solution 1 drop  1 drop Both Eyes QHS Stehler, Bailey C, PA-C   1 drop at 01/31/24 2134   levothyroxine  (SYNTHROID ) tablet 88 mcg  88 mcg Oral Q0600 Zelphia Higashi, MD   88 mcg at 02/01/24 0641   metoprolol  tartrate (LOPRESSOR ) injection 2.5-5 mg  2.5-5 mg Intravenous Q2H PRN Randa Burton, PA-C       midodrine (PROAMATINE) tablet 10 mg  10 mg Oral TID WC Hendrickson, Steven C, MD       morphine (PF) 2 MG/ML injection 1-4 mg  1-4 mg Intravenous Q1H PRN Randa Burton, PA-C   2 mg at 01/30/24 1856   naphazoline-glycerin  (CLEAR EYES REDNESS) ophth solution 1-2 drop  1-2 drop Both Eyes QID PRN Debroah Fanning C, PA-C       norepinephrine  (LEVOPHED ) 4mg  in (0.016 mg/mL) premix infusion  0-10 mcg/min Intravenous Titrated Zelphia Higashi, MD 15 mL/hr at 02/01/24 0900 4 mcg/min at 02/01/24 0900   ondansetron  (ZOFRAN ) injection 4 mg  4 mg Intravenous Q6H PRN Debroah Fanning C, PA-C   4 mg at 01/31/24 1342   Oral care mouth rinse  15 mL Mouth Rinse PRN Zelphia Higashi, MD       oxyCODONE (Oxy IR/ROXICODONE) immediate release tablet 5-10 mg  5-10 mg Oral Q3H PRN Zelphia Higashi, MD       pantoprazole  (PROTONIX ) EC tablet 40 mg  40 mg Oral QHS Zelphia Higashi, MD   40 mg at 01/31/24 2133   polyethylene glycol (MIRALAX / GLYCOLAX) packet 17 g  17 g Oral Daily Zelphia Higashi, MD       senna-docusate (Senokot-S) tablet 1 tablet  1 tablet Oral BID Hendrickson, Steven C, MD   1 tablet at 01/31/24 2133   simethicone (MYLICON) chewable tablet 80 mg  80 mg Oral QID PRN Bensimhon, Daniel R, MD   80 mg at 01/31/24 1401   traMADol (ULTRAM)  tablet 50-100 mg  50-100 mg Oral Q4H PRN Zelphia Higashi, MD         Discharge Medications: Please see discharge summary for a list of discharge medications.  Relevant Imaging Results:  Relevant Lab Results:   Additional Information SS#; [id number]  Murphy Arn, LCSWA

## 2024-02-01 NOTE — Progress Notes (Signed)
      [address]       Kaitlyn Wright [address]             [phone]      CTSP for low BP with increased demand for epi.  BP highly variable, dropping into 70 s when asleep and increasing significantly when she akes up and talks.  When aroused she is alert and appropriate.  CVP 12 after albumini  Will start norepi drip and monitor  Sema Stangler C. Luna Salinas, MD [facility] [phone]

## 2024-02-01 NOTE — Progress Notes (Signed)
 Physical Therapy Treatment Patient Details Name: Kaitlyn Wright MRN: [id number] DOB: 1946/01/20 Today's Date: 02/01/2024   History of Present Illness 78 year old woman  who presented with chest pain that she attributed to acid reflux on 5/5. +NSTEMI; LHC with 3v CAD; CABG 5/09; post-op cardiogenic shock and remained intubated until 5/12; Echo EF 60-65%  PMH- remote tobacco abuse, hypothyroidism, anxiety    PT Comments  Pt tangential and requires freq re-direction to stay on task. Pt with noted anxiety regarding mobilizing. Pt presents with decreased activity tolerance, generalized deconditioning, poor comprehension of sternal precautions, and is at increased falls risk. Pt to benefit from inpatient rehab program > 3 hrs a day to achieve safe mod I level of function for safe transition home.    If plan is discharge home, recommend the following: Assistance with cooking/housework;Assist for transportation   Can travel by private vehicle        Equipment Recommendations  Rolling walker (2 wheels)    Recommendations for Other Services Rehab consult     Precautions / Restrictions Precautions Precautions: Sternal Precaution Booklet Issued: No Recall of Precautions/Restrictions: Impaired Precaution/Restrictions Comments: desires red heart pillow Restrictions Weight Bearing Restrictions Per Provider Order: Yes (sternal precautions) RUE Weight Bearing Per Provider Order: Non weight bearing LUE Weight Bearing Per Provider Order: Non weight bearing Other Position/Activity Restrictions: Sternal     Mobility  Bed Mobility Overal bed mobility: Needs Assistance Bed Mobility: Supine to Sit     Supine to sit: Mod assist, HOB elevated, +2 for safety/equipment     General bed mobility comments: HOB elevated, with max encouragement pt able to bring LEs towards EOB, modA for trunk elevation and to scoot hips to EOB    Transfers Overall transfer level: Needs assistance Equipment used: None  Marlea Silvers walker) Transfers: Sit to/from Stand Sit to Stand: Mod assist, +2 safety/equipment           General transfer comment: pt held onto heart pillow, pt slow, guarded, and cautious. modA to power up and steady during transitioning hands to eva walker    Ambulation/Gait Ambulation/Gait assistance: Mod assist (+3 equipment (IV plus chair follow)) Gait Distance (Feet): 70 Feet Assistive device: Blanca Bunch Gait Pattern/deviations: Step-through pattern, Decreased stride length, Drifts right/left Gait velocity: dec Gait velocity interpretation: <1.31 ft/sec, indicative of household ambulator   General Gait Details: pt looking down, vearing to the L, pt c/o "I'm going to faint. I feel like I'm going to throw up." Pt with noted loss of color in lips. color returned once seated in recliner with LEs elevated. Pt requiring increased assist with increased distance   Stairs             Wheelchair Mobility     Tilt Bed    Modified Rankin (Stroke Patients Only)       Balance Overall balance assessment: Needs assistance Sitting-balance support: Feet supported Sitting balance-Leahy Scale: Fair     Standing balance support: Bilateral upper extremity supported Standing balance-Leahy Scale: Poor                              Communication Communication Communication: No apparent difficulties  Cognition Arousal: Alert Behavior During Therapy: WFL for tasks assessed/performed   PT - Cognitive impairments: No apparent impairments                       PT - Cognition Comments: pt tangential Following commands: Intact  Cueing Cueing Techniques: Verbal cues, Gestural cues, Tactile cues  Exercises      General Comments General comments (skin integrity, edema, etc.): VSS, RN present and monitoring lines      Pertinent Vitals/Pain Pain Assessment Pain Assessment: Faces Faces Pain Scale: Hurts little more Pain Location: sternal Pain Descriptors /  Indicators: Tender Pain Intervention(s): Monitored during session    Home Living                          Prior Function            PT Goals (current goals can now be found in the care plan section) Acute Rehab PT Goals Patient Stated Goal: return home PT Goal Formulation: With patient Time For Goal Achievement: 02/14/24 Potential to Achieve Goals: Good Progress towards PT goals: Progressing toward goals    Frequency    Min 2X/week      PT Plan      Co-evaluation              AM-PAC PT "6 Clicks" Mobility   Outcome Measure  Help needed turning from your back to your side while in a flat bed without using bedrails?: A Little Help needed moving from lying on your back to sitting on the side of a flat bed without using bedrails?: A Lot Help needed moving to and from a bed to a chair (including a wheelchair)?: Total Help needed standing up from a chair using your arms (e.g., wheelchair or bedside chair)?: Total Help needed to walk in hospital room?: Total Help needed climbing 3-5 steps with a railing? : Total 6 Click Score: 9    End of Session Equipment Utilized During Treatment: Oxygen Activity Tolerance: Patient limited by fatigue Patient left: with call bell/phone within reach;with nursing/sitter in room;in chair Nurse Communication: Mobility status PT Visit Diagnosis: Muscle weakness (generalized) (M62.81);Difficulty in walking, not elsewhere classified (R26.2)     Time: 1200-1229 PT Time Calculation (min) (ACUTE ONLY): 29 min  Charges:    $Gait Training: 8-22 mins $Therapeutic Activity: 8-22 mins PT General Charges $$ ACUTE PT VISIT: 1 Visit                     Renaee Caro, PT, DPT Acute Rehabilitation Services Secure chat preferred Office #: [phone]    Jenna Moan 02/01/2024, 1:42 PM

## 2024-02-01 NOTE — TOC Progression Note (Signed)
 Transition of Care [facility]) - Progression Note    Patient Details  Name: Kaitlyn Wright MRN: [id number] Date of Birth: Jul 09, 1946  Transition of [facility] CM/SW Contact  Ernst Heap Phone Number: [phone] 02/01/2024, 10:30 AM  Clinical Narrative:   [facility] CSW completed the patients passr, FL2, and faxed out to SNFs. SNF bed offers pending.   TOC will continue following.     Expected Discharge Plan: Skilled Nursing Facility Barriers to Discharge: Continued Medical Work up  Expected Discharge Plan and Services   Discharge Planning Services: CM Consult Post Acute Care Choice: Skilled Nursing Facility Living arrangements for the past 2 months: Apartment                                       Social Determinants of Health (SDOH) Interventions SDOH Screenings   Food Insecurity: No Food Insecurity (01/25/2024)  Housing: Low Risk  (01/25/2024)  Transportation Needs: No Transportation Needs (01/25/2024)  Utilities: Not At Risk (01/25/2024)  Social Connections: Unknown (01/25/2024)  Tobacco Use: Low Risk  (01/27/2024)    Readmission Risk Interventions     No data to display

## 2024-02-02 ENCOUNTER — Inpatient Hospital Stay (HOSPITAL_COMMUNITY)

## 2024-02-02 DIAGNOSIS — R5381 Other malaise: Secondary | ICD-10-CM | POA: Diagnosis not present

## 2024-02-02 DIAGNOSIS — F32A Depression, unspecified: Secondary | ICD-10-CM | POA: Diagnosis not present

## 2024-02-02 DIAGNOSIS — Z951 Presence of aortocoronary bypass graft: Secondary | ICD-10-CM | POA: Diagnosis not present

## 2024-02-02 DIAGNOSIS — F419 Anxiety disorder, unspecified: Secondary | ICD-10-CM | POA: Diagnosis not present

## 2024-02-02 DIAGNOSIS — I214 Non-ST elevation (NSTEMI) myocardial infarction: Secondary | ICD-10-CM | POA: Diagnosis not present

## 2024-02-02 LAB — BASIC METABOLIC PANEL WITH GFR
Anion gap: 8 (ref 5–15)
BUN: 16 mg/dL (ref 8–23)
CO2: 29 mmol/L (ref 22–32)
Calcium: 8.1 mg/dL — ABNORMAL LOW (ref 8.9–10.3)
Chloride: 100 mmol/L (ref 98–111)
Creatinine, Ser: 0.88 mg/dL (ref 0.44–1.00)
GFR, Estimated: >60 mL/min (ref >60)
Glucose, Bld: 96 mg/dL (ref 70–99)
Potassium: 3.7 mmol/L (ref 3.5–5.1)
Sodium: 137 mmol/L (ref 135–145)

## 2024-02-02 LAB — CBC
HCT: 28.5 % — ABNORMAL LOW (ref 36.0–46.0)
Hemoglobin: 9.1 g/dL — ABNORMAL LOW (ref 12.0–15.0)
MCH: 31 pg (ref 26.0–34.0)
MCHC: 31.9 g/dL (ref 30.0–36.0)
MCV: 96.9 fL (ref 80.0–100.0)
Platelets: 196 10*3/uL (ref 150–400)
RBC: 2.94 MIL/uL — ABNORMAL LOW (ref 3.87–5.11)
RDW: 15 % (ref 11.5–15.5)
WBC: 10.9 10*3/uL — ABNORMAL HIGH (ref 4.0–10.5)
nRBC: 0 % (ref 0.0–0.2)

## 2024-02-02 LAB — COOXEMETRY PANEL
Carboxyhemoglobin: 2.6 % — ABNORMAL HIGH (ref 0.5–1.5)
Carboxyhemoglobin: 1.9 % — ABNORMAL HIGH (ref 0.5–1.5)
Methemoglobin: <0.7 % (ref 0.0–1.5)
Methemoglobin: <0.7 % (ref 0.0–1.5)
O2 Saturation: 61.8 %
O2 Saturation: 68 %
Total hemoglobin: 9.4 g/dL — ABNORMAL LOW (ref 12.0–16.0)
Total hemoglobin: 10.7 g/dL — ABNORMAL LOW (ref 12.0–16.0)

## 2024-02-02 LAB — GLUCOSE, CAPILLARY
Glucose-Capillary: 109 mg/dL — ABNORMAL HIGH (ref 70–99)
Glucose-Capillary: 94 mg/dL (ref 70–99)
Glucose-Capillary: 149 mg/dL — ABNORMAL HIGH (ref 70–99)
Glucose-Capillary: 114 mg/dL — ABNORMAL HIGH (ref 70–99)

## 2024-02-02 MED ORDER — FUROSEMIDE 10 MG/ML IJ SOLN
80.0000 mg | Freq: Once | INTRAMUSCULAR | Status: AC
  Administered 2024-02-02: 80 mg via INTRAVENOUS
  Filled 2024-02-02: qty 8

## 2024-02-02 MED ORDER — SMOG ENEMA
960.0000 mL | Freq: Once | RECTAL | Status: DC
  Filled 2024-02-02: qty 960

## 2024-02-02 MED ORDER — EPINEPHRINE HCL 5 MG/250ML IV SOLN IN NS
0.5000 ug/min | INTRAVENOUS | Status: DC
  Administered 2024-02-03 (×2): 1 ug/min via INTRAVENOUS
  Filled 2024-02-02: qty 250

## 2024-02-02 MED ORDER — POTASSIUM CHLORIDE 20 MEQ PO PACK
60.0000 meq | PACK | Freq: Once | ORAL | Status: AC
  Administered 2024-02-02: 60 meq via ORAL
  Filled 2024-02-02: qty 3

## 2024-02-02 MED ORDER — POTASSIUM CHLORIDE 20 MEQ PO PACK
40.0000 meq | PACK | Freq: Once | ORAL | Status: AC
  Administered 2024-02-02: 40 meq via ORAL
  Filled 2024-02-02: qty 2

## 2024-02-02 MED ORDER — METOCLOPRAMIDE HCL 5 MG/ML IJ SOLN
10.0000 mg | Freq: Four times a day (QID) | INTRAMUSCULAR | Status: AC
Start: 2024-02-02 — End: 2024-02-03
  Administered 2024-02-02 – 2024-02-03 (×4): 10 mg via INTRAVENOUS
  Filled 2024-02-02 (×4): qty 2

## 2024-02-02 NOTE — Consult Note (Signed)
 Physical Medicine and Rehabilitation Consult Reason for Consult:debility Referring Physician: [place]   HPI: Kaitlyn Wright is a 78 y.o. female with a history of anxiety/depression as well as hypothyroidism who presented on 01/23/2024 with chest pain.  She was found to have three-vessel coronary artery disease and on 01/27/2024 she underwent CABG x 4.  Postoperatively patient went into cardiogenic shock due to right ventricular failure and remained intubated until 01/30/2024.  Patient was diuresed and is improving.  She remains on oxygen via nasal cannula.  Patient was up with therapies yesterday and was mod assist for sit to stand transfers and walked 70 feet using the [facility] walker.  Patient is reported to have felt "faint" while standing and walking.  Also note the patient was tangential and required frequent redirection to stay on task.  She needed repeated cueing for sternal precautions as well.  Patient lives at home alone and was independent in working prior to admission.  She lives in a 1 level house with level entry.    Home: Home Living Family/patient expects to be discharged to:: Private residence Living Arrangements: Alone Available Help at Discharge: Family, Available PRN/intermittently Type of Home: Other(Comment) (1 level condo) Home Access: Level entry Home Layout: One level Bathroom Shower/Tub: Health visitor: Standard Bathroom Accessibility: No Home Equipment: BSC/3in1  Functional History: Prior Function Prior Level of Function : Independent/Modified Independent, Working/employed, Driving ADLs Comments: Works 3 shifts/week at [employer], walks at a [employer] 3x/wk.  No assist with ADL,iADL Functional Status:  Mobility: Bed Mobility Overal bed mobility: Needs Assistance Bed Mobility: Supine to Sit Supine to sit: Mod assist, HOB elevated, +2 for safety/equipment Sit to sidelying: Mod assist, +2 for physical assistance General bed mobility comments:  HOB elevated, with max encouragement pt able to bring LEs towards EOB, modA for trunk elevation and to scoot hips to EOB Transfers Overall transfer level: Needs assistance Equipment used: None Kaitlyn Wright walker) Transfers: Sit to/from Stand Sit to Stand: Mod assist, +2 safety/equipment Bed to/from chair/wheelchair/BSC transfer type:: Step pivot Step pivot transfers: Min assist, +2 safety/equipment General transfer comment: pt held onto heart pillow, pt slow, guarded, and cautious. modA to power up and steady during transitioning hands to eva walker Ambulation/Gait Ambulation/Gait assistance: Mod assist (+3 equipment (IV plus chair follow)) Gait Distance (Feet): 70 Feet Assistive device: Blanca Bunch Gait Pattern/deviations: Step-through pattern, Decreased stride length, Drifts right/left General Gait Details: pt looking down, vearing to the L, pt c/o "I'm going to faint. I feel like I'm going to throw up." Pt with noted loss of color in lips. color returned once seated in recliner with LEs elevated. Pt requiring increased assist with increased distance Gait velocity: dec Gait velocity interpretation: <1.31 ft/sec, indicative of household ambulator    ADL: ADL Grooming: Set up, Sitting Upper Body Dressing : Moderate assistance, Sitting Lower Body Dressing: Moderate assistance, Sit to/from stand Toilet Transfer: Minimal assistance, Stand-pivot, BSC/3in1  Cognition: Cognition Orientation Level: Oriented X4 Cognition Arousal: Alert Behavior During Therapy: WFL for tasks assessed/performed   Review of Systems  Constitutional:  Positive for malaise/fatigue. Negative for fever.  HENT: Negative.    Eyes: Negative.   Respiratory:  Positive for cough and shortness of breath.   Cardiovascular: Negative.   Gastrointestinal: Negative.   Genitourinary: Negative.   Musculoskeletal:  Positive for myalgias.       Chest wall pain  Skin: Negative.   Neurological:  Positive for weakness. Negative  for sensory change and speech change.  Psychiatric/Behavioral:  The patient is nervous/anxious.    Past Medical History:  Diagnosis Date   Anxiety    Depression    Hypothyroidism    Past Surgical History:  Procedure Laterality Date   CHOLECYSTECTOMY     CORONARY ARTERY BYPASS GRAFT N/A 01/27/2024   Procedure: CORONARY ARTERY BYPASS GRAFTING X 4, USING LEFT INTERNAL MAMMARY ARTERY AND RIGHT ENDOSCOPIC HARVESTED GREATER SAPHENOUS VEIN;  Surgeon: Zelphia Higashi, MD;  Location: [facility] OR;  Service: Open Heart Surgery;  Laterality: N/A;   LEFT HEART CATH AND CORONARY ANGIOGRAPHY N/A 01/24/2024   Procedure: LEFT HEART CATH AND CORONARY ANGIOGRAPHY;  Surgeon: [name], MD;  Location: [facility] INVASIVE CV LAB;  Service: Cardiovascular;  Laterality: N/A;   TEE WITHOUT CARDIOVERSION N/A 01/27/2024   Procedure: ECHOCARDIOGRAM, TRANSESOPHAGEAL;  Surgeon: Zelphia Higashi, MD;  Location: [facility] OR;  Service: Open Heart Surgery;  Laterality: N/A;   Family History  Problem Relation Age of Onset   Stroke Father    Social History:  reports that she has never smoked. She has never used smokeless tobacco. She reports that she does not drink alcohol and does not use drugs. Allergies:  Allergies  Allergen Reactions   Naprosyn  [Naproxen ]     Upset stomach    Latex Rash   Medications Prior to Admission  Medication Sig Dispense Refill   acetaminophen  (TYLENOL ) 500 MG tablet Take 1,000 mg by mouth See admin instructions. Take 1000mg  (2 tablets) by mouth every morning and 1000mg  (2 tablets) at night if needed for pain.     ALPRAZolam  (XANAX ) 1 MG tablet Take 1 mg by mouth See admin instructions. Take 1mg  (1 tablet) by mouth every night and 1mg  (1 tablet) during the day if needed.     aspirin  EC 325 MG tablet Take 325-650 mg by mouth daily as needed for moderate pain (pain score 4-6).     aspirin  EC 81 MG tablet Take 81 mg by mouth every morning.     B Complex-C (B-COMPLEX WITH VITAMIN C) tablet Take 1 tablet  by mouth daily.     cholecalciferol  (VITAMIN D ) 1000 UNITS tablet Take 1,000 Units by mouth daily.     Cinnamon 500 MG capsule Take 500 mg by mouth daily.     hydrOXYzine  (ATARAX ) 10 MG tablet Take 10 mg by mouth at bedtime.     Krill Oil 1000 MG CAPS Take 1 capsule by mouth daily.     latanoprost  (XALATAN ) 0.005 % ophthalmic solution Place 1 drop into both eyes at bedtime.     levothyroxine  (SYNTHROID ) 88 MCG tablet Take 88 mcg by mouth daily.     loperamide (IMODIUM) 1 MG/5ML solution Take 5 mLs by mouth daily as needed for diarrhea or loose stools.     Omega-3 Fatty Acids (FISH OIL) 1000 MG CPDR Take 1 capsule by mouth 2 (two) times daily.     Polyethyl Glycol-Propyl Glycol (SYSTANE FREE OP) Place 1 drop into both eyes 2 (two) times daily.     Red Yeast Rice 600 MG CAPS Take 2 capsules by mouth 2 (two) times daily.     vitamin C (ASCORBIC ACID) 500 MG tablet Take 500 mg by mouth daily.     pantoprazole  (PROTONIX ) 40 MG tablet Take 40 mg by mouth daily. (Patient not taking: Reported on 01/23/2024)       Blood pressure (!) 97/47, pulse 86, temperature 97.7 F (36.5 C), temperature source Oral, resp. rate (!) 25, height 5\' 3"  (1.6 m), weight 63.1  kg, SpO2 (!) 79%. Physical Exam Constitutional:      General: She is not in acute distress. HENT:     Head: Normocephalic and atraumatic.     Nose: Nose normal.     Mouth/Throat:     Pharynx: Oropharynx is clear.  Eyes:     Extraocular Movements: Extraocular movements intact.     Pupils: Pupils are equal, round, and reactive to light.  Cardiovascular:     Rate and Rhythm: Normal rate.  Pulmonary:     Effort: Pulmonary effort is normal.  Abdominal:     Palpations: Abdomen is soft.  Musculoskeletal:        General: No swelling.     Cervical back: Normal range of motion.     Comments: Chest wall discomfort  Skin:    General: Skin is warm.     Comments: Chest incision CDI  Neurological:     Mental Status: She is alert.     Comments:  Alert and oriented x 3. Normal insight and awareness. Intact Memory generally. ?STM deficits. Short attention span. Tangential Normal language and speech. Cranial nerve exam unremarkable. MMT: BUE 3/5 (pain precautions) to 4+/5 both hands. BLE 4-/5 prox to 4+/5 distally. Aaron Aas    Psychiatric:     Comments: Pleasant but anxious.      Results for orders placed or performed during the hospital encounter of 01/23/24 (from the past 24 hours)  Glucose, capillary     Status: None   Collection Time: 02/01/24 11:47 AM  Result Value Ref Range   Glucose-Capillary 85 70 - 99 mg/dL  Cooxemetry Panel (carboxy, met, total hgb, O2 sat)     Status: Abnormal   Collection Time: 02/01/24 11:50 AM  Result Value Ref Range   Total hemoglobin 9.9 (L) 12.0 - 16.0 g/dL   O2 Saturation 96.0 %   Carboxyhemoglobin 1.0 0.5 - 1.5 %   Methemoglobin <0.7 0.0 - 1.5 %  Cooxemetry Panel (carboxy, met, total hgb, O2 sat)     Status: Abnormal   Collection Time: 02/01/24  1:50 PM  Result Value Ref Range   Total hemoglobin 10.5 (L) 12.0 - 16.0 g/dL   O2 Saturation 45.4 %   Carboxyhemoglobin 1.3 0.5 - 1.5 %   Methemoglobin <0.7 0.0 - 1.5 %  Glucose, capillary     Status: None   Collection Time: 02/01/24  4:53 PM  Result Value Ref Range   Glucose-Capillary 74 70 - 99 mg/dL  Glucose, capillary     Status: Abnormal   Collection Time: 02/01/24 11:04 PM  Result Value Ref Range   Glucose-Capillary 198 (H) 70 - 99 mg/dL  CBC     Status: Abnormal   Collection Time: 02/02/24  5:27 AM  Result Value Ref Range   WBC 10.9 (H) 4.0 - 10.5 K/uL   RBC 2.94 (L) 3.87 - 5.11 MIL/uL   Hemoglobin 9.1 (L) 12.0 - 15.0 g/dL   HCT 09.8 (L) 11.9 - 14.7 %   MCV 96.9 80.0 - 100.0 fL   MCH 31.0 26.0 - 34.0 pg   MCHC 31.9 30.0 - 36.0 g/dL   RDW 82.9 56.2 - 13.0 %   Platelets 196 150 - 400 K/uL   nRBC 0.0 0.0 - 0.2 %  Basic metabolic panel with GFR     Status: Abnormal   Collection Time: 02/02/24  5:27 AM  Result Value Ref Range   Sodium 137  135 - 145 mmol/L   Potassium 3.7 3.5 - 5.1 mmol/L  Chloride 100 98 - 111 mmol/L   CO2 29 22 - 32 mmol/L   Glucose, Bld 96 70 - 99 mg/dL   BUN 16 8 - 23 mg/dL   Creatinine, Ser 1.61 0.44 - 1.00 mg/dL   Calcium  8.1 (L) 8.9 - 10.3 mg/dL   GFR, Estimated >09 >60 mL/min   Anion gap 8 5 - 15  Cooxemetry Panel (carboxy, met, total hgb, O2 sat)     Status: Abnormal   Collection Time: 02/02/24  5:27 AM  Result Value Ref Range   Total hemoglobin 9.4 (L) 12.0 - 16.0 g/dL   O2 Saturation 45.4 %   Carboxyhemoglobin 2.6 (H) 0.5 - 1.5 %   Methemoglobin <0.7 0.0 - 1.5 %  Glucose, capillary     Status: Abnormal   Collection Time: 02/02/24  7:04 AM  Result Value Ref Range   Glucose-Capillary 109 (H) 70 - 99 mg/dL   DG Abd 1 View Result Date: 02/02/2024 CLINICAL DATA:  Constipation EXAM: ABDOMEN - 1 VIEW COMPARISON:  None Available. FINDINGS: The bowel gas pattern is normal. No radio-opaque calculi or other significant radiographic abnormality are seen. IMPRESSION: Negative.  No evidence of constipation Electronically Signed   By: Fredrich Jefferson M.D.   On: 02/02/2024 09:55   DG Chest Port 1 View Result Date: 02/02/2024 CLINICAL DATA:  Status post CABG. EXAM: PORTABLE CHEST 1 VIEW COMPARISON:  01/31/2024 FINDINGS: The cardio pericardial silhouette is enlarged. Left base collapse/consolidation with probable effusion is similar to prior. There is a tiny right pleural effusion. Linear right parahilar density suggests atelectasis or scarring. Right IJ central line tip overlies the proximal SVC level. No acute bony abnormality. Telemetry leads overlie the chest. IMPRESSION: 1. No substantial change. 2. Left base collapse/consolidation with probable effusion, similar to prior. 3. Tiny right pleural effusion. Electronically Signed   By: Donnal Fusi M.D.   On: 02/02/2024 07:52   ECHOCARDIOGRAM LIMITED Result Date: 02/01/2024    ECHOCARDIOGRAM LIMITED REPORT   Patient Name:   Kaitlyn Wright Date of Exam: 02/01/2024  Medical Rec #:  [id number]   Height:       63.0 in Accession #:    [id number]  Weight:       143.3 lb Date of Birth:  1945-10-29   BSA:          1.678 m Patient Age:    78 years    BP:           122/50 mmHg Patient Gender: F           HR:           93 bpm. Exam Location:  Inpatient Procedure: Limited Echo (Both Spectral and Color Flow Doppler were utilized            during procedure). Indications:    Pericardial effusion I31.3  History:        Patient has prior history of Echocardiogram examinations, most                 recent 01/30/2024. CAD; Prior CABG.  Sonographer:    Astrid Blamer Referring Phys: [phone] STEVEN C HENDRICKSON  Sonographer Comments: R/O Pericardial effusion IMPRESSIONS  1. Left ventricular ejection fraction, by estimation, is 60 to 65%. The left ventricle has normal function. There is mild left ventricular hypertrophy.  2. Right ventricular systolic function is low normal. The right ventricular size is normal.  3. No significant pericardial fluid. Serial image review demonstrates a prominent epicardial fat pad, which is  likely what is demonstrated again today. FINDINGS  Left Ventricle: Left ventricular ejection fraction, by estimation, is 60 to 65%. The left ventricle has normal function. There is mild left ventricular hypertrophy. Right Ventricle: The right ventricular size is normal. Right ventricular systolic function is low normal. Pericardium: No significant pericardial fluid. Serial image review demonstrates a prominent epicardial fat pad, which is likely what is demonstrated again today. There is no evidence of pericardial effusion. LEFT VENTRICLE PLAX 2D LVIDd:         3.80 cm LVIDs:         2.40 cm LV PW:         1.00 cm LV IVS:        1.10 cm  Grady Lawman MD Electronically signed by Grady Lawman MD Signature Date/Time: 02/01/2024/4:06:33 PM    Final     Assessment/Plan: Diagnosis: 78 year old female with debility after CABG x 4 and complicated postoperative course.  I question  whether there is an element of hypoxic encephalopathy as well Does the need for close, 24 hr/day medical supervision in concert with the patient's rehab needs make it unreasonable for this patient to be served in a less intensive setting? Yes Co-Morbidities requiring supervision/potential complications:  -Cardiovascular management -Hypoxemia -Nutrition and electrolyte management Due to bladder management, bowel management, safety, skin/wound care, disease management, medication administration, pain management, and patient education, does the patient require 24 hr/day rehab nursing? Yes Does the patient require coordinated care of a physician, rehab nurse, therapy disciplines of PT, OT, ?SLP to address physical and functional deficits in the context of the above medical diagnosis(es)? Yes Addressing deficits in the following areas: balance, endurance, locomotion, strength, transferring, bowel/bladder control, bathing, dressing, feeding, grooming, toileting, and psychosocial support Can the patient actively participate in an intensive therapy program of at least 3 hrs of therapy per day at least 5 days per week? Yes The potential for patient to make measurable gains while on inpatient rehab is good Anticipated functional outcomes upon discharge from inpatient rehab are modified independent  with PT, modified independent and supervision with OT, modified independent and supervision with SLP. Estimated rehab length of stay to reach the above functional goals is: potentially 10-14 days Anticipated discharge destination: Home? Overall Rehab/Functional Prognosis: excellent  POST ACUTE RECOMMENDATIONS: This patient's condition is appropriate for continued rehabilitative care in the following setting: see below Patient has agreed to participate in recommended program. See below Note that insurance prior authorization may be required for reimbursement for recommended care.  Comment: I spoke with Mrs.  Twaddle at length. She reports that her only daughter works and is going on a "8-10 day trip" at the end of the month.  Her brother has "COPD." She also has concerns about what the costs are to her (rehab vs SNF) which I generally described. If we do bring her to inpatient rehab, she probably will be modified independent for most basic tasks, but she shouldn't be going home alone. If daughter or another caregiver can't be in the house with her, she will need to look at longer term options such as SNF. Rehab Admissions Coordinator to follow up.       I have personally performed a face to face diagnostic evaluation of this patient. Additionally, I have examined the patient's medical record including any pertinent labs and radiographic images.    Thanks,  Rawland Caddy, MD 02/02/2024

## 2024-02-02 NOTE — Evaluation (Signed)
 Speech Language Pathology Evaluation Patient Details Name: Kaitlyn Wright MRN: [id number] DOB: [birth year] Today's Date: 02/02/2024 Time: 2952-8413 SLP Time Calculation (min) (ACUTE ONLY): 19 min  Problem List:  Patient Active Problem List   Diagnosis Date Noted   S/P CABG x 4 01/27/2024   Anxiety and depression 01/25/2024   Hypothyroidism 01/25/2024   NSTEMI (non-ST elevated myocardial infarction) (HCC) 01/23/2024   Elevated troponin 01/23/2024   Past Medical History:  Past Medical History:  Diagnosis Date   Anxiety    Depression    Hypothyroidism    Past Surgical History:  Past Surgical History:  Procedure Laterality Date   CHOLECYSTECTOMY     CORONARY ARTERY BYPASS GRAFT N/A 01/27/2024   Procedure: CORONARY ARTERY BYPASS GRAFTING X 4, USING LEFT INTERNAL MAMMARY ARTERY AND RIGHT ENDOSCOPIC HARVESTED GREATER SAPHENOUS VEIN;  Surgeon: Zelphia Higashi, MD;  Location: [facility] OR;  Service: Open Heart Surgery;  Laterality: N/A;   LEFT HEART CATH AND CORONARY ANGIOGRAPHY N/A 01/24/2024   Procedure: LEFT HEART CATH AND CORONARY ANGIOGRAPHY;  Surgeon: [name], MD;  Location: [facility] INVASIVE CV LAB;  Service: Cardiovascular;  Laterality: N/A;   TEE WITHOUT CARDIOVERSION N/A 01/27/2024   Procedure: ECHOCARDIOGRAM, TRANSESOPHAGEAL;  Surgeon: Zelphia Higashi, MD;  Location: [facility] OR;  Service: Open Heart Surgery;  Laterality: N/A;   HPI:  Kaitlyn Wright is an 78 yo female who presented with chest pain that she attributed to acid reflux on 5/5. +NSTEMI; LHC with 3v CAD; CABG 5/09; post-op cardiogenic shock and remained intubated until 5/12; Echo EF 60-65%  PMH- remote tobacco abuse, hypothyroidism, anxiety   Assessment / Plan / Recommendation Clinical Impression  Pt states she lives alone and continues to work 3 days/week. She scored 25/30 (a score of 27 or above is considered WFL) on the SLUMS characterized by mild deficits related to attention and memory. Pt can be distracted by sharing personal  stories but ultimately attends to structured tasks. She recalled 3/5 novel words after a delay and the remaining two given semantic cues. She acknowledges memory deficits but states they are not acute and attributes them to aging. Pt verbalizes understanding of increased supervision initially but deflects with humor, stating her dogs can provide this. Overall, suspect pt's presentation is near baseline. She may benefit from short-term SLP f/u to target functional tasks to facilitate return home. Will continue following.    SLP Assessment  SLP Recommendation/Assessment: Patient needs continued Speech Lanaguage Pathology Services SLP Visit Diagnosis: Cognitive communication deficit (R41.841)    Recommendations for follow up therapy are one component of a multi-disciplinary discharge planning process, led by the attending physician.  Recommendations may be updated based on patient status, additional functional criteria and insurance authorization.    Follow Up Recommendations  Acute inpatient rehab (3hours/day)    Assistance Recommended at Discharge  Intermittent Supervision/Assistance  Functional Status Assessment Patient has had a recent decline in their functional status and demonstrates the ability to make significant improvements in function in a reasonable and predictable amount of time.  Frequency and Duration min 1 x/week  2 weeks      SLP Evaluation Cognition  Overall Cognitive Status: Impaired/Different from baseline Arousal/Alertness: Awake/alert Orientation Level: Oriented X4 Attention: Sustained Sustained Attention: Impaired Sustained Attention Impairment: Verbal basic Memory: Impaired Memory Impairment: Retrieval deficit Awareness: Appears intact Problem Solving: Appears intact       Comprehension  Auditory Comprehension Overall Auditory Comprehension: Appears within functional limits for tasks assessed    Expression Expression Primary Mode of  Expression:  Verbal Verbal Expression Overall Verbal Expression: Appears within functional limits for tasks assessed   Oral / Motor  Oral Motor/Sensory Function Overall Oral Motor/Sensory Function: Within functional limits Motor Speech Overall Motor Speech: Appears within functional limits for tasks assessed            Amil Kale, M.A., CCC-SLP Speech Language Pathology, Acute Rehabilitation Services  Secure Chat preferred [phone]  02/02/2024, 10:23 AM

## 2024-02-02 NOTE — Progress Notes (Signed)
 Inpatient Rehab Admissions Coordinator:   Stopped by to see pt at bedside to f/u on Dr. Delfin Fell consult.  Pt with nursing on bedtime.  I will f/u tomorrow.   Loye Rumble, PT, DPT Admissions Coordinator [phone] 02/02/24  3:59 PM

## 2024-02-02 NOTE — Progress Notes (Addendum)
 [address]       [place] [address]             [phone]      6 Days Post-Op Procedure(s) (LRB): CORONARY ARTERY BYPASS GRAFTING X 4, USING LEFT INTERNAL MAMMARY ARTERY AND RIGHT ENDOSCOPIC HARVESTED GREATER SAPHENOUS VEIN (N/A) ECHOCARDIOGRAM, TRANSESOPHAGEAL (N/A)  Subjective:  Patient sitting up in chair.  Asks if she can get up as her bottom gets sore after sitting for a long time.  She states the food is terrible.  She states she has been passing water as a BM, but no actual stool.  States she is passing a lot of gas  Objective: Vital signs in last 24 hours: Temp:  [97.7 F (36.5 C)] 97.7 F (36.5 C) (05/14 1600) Pulse Rate:  [75-108] 86 (05/15 0700) Cardiac Rhythm: Normal sinus rhythm (05/15 0400) Resp:  [13-31] 25 (05/15 0700) BP: (82-120)/(41-70) 97/47 (05/15 0600) SpO2:  [71 %-100 %] 79 % (05/15 0700) Arterial Line BP: (80-177)/(35-105) 127/62 (05/15 0700) Weight:  [63.1 kg] 63.1 kg (05/15 0500)  Hemodynamic parameters for last 24 hours: CVP:  [0 mmHg-62 mmHg] 21 mmHg  Intake/Output from previous day: 05/14 0701 - 05/15 0700 In: 765.4 [P.O.:580; I.V.:185.4] Out: 2050 [Urine:2050]  General appearance: alert, cooperative, and no distress Heart: regular rate and rhythm Lungs: diminished breath sounds bibasilar Abdomen: soft, mild distention, little to no BS Extremities: edema trace Wound: clean and dry  Lab Results: Recent Labs    01/31/24 0427 02/02/24 0527  WBC 19.9* 10.9*  HGB 10.1* 9.1*  HCT 29.9* 28.5*  PLT 154 196   BMET:  Recent Labs    02/01/24 0423 02/02/24 0527  NA 135 137  K 3.7 3.7  CL 102 100  CO2 26 29  GLUCOSE 165* 96  BUN 16 16  CREATININE 0.93 0.88  CALCIUM  9.1 8.1*    PT/INR: No results for input(s): "LABPROT", "INR" in the last 72 hours. ABG    Component Value Date/Time   PHART 7.453 (H) 01/30/2024 1036   HCO3 23.9 01/30/2024 1036   TCO2 25 01/30/2024 1036   ACIDBASEDEF 4.0 (H) 01/29/2024  2050   O2SAT 61.8 02/02/2024 0527   CBG (last 3)  Recent Labs    02/01/24 1653 02/01/24 2304 02/02/24 0704  GLUCAP 74 198* 109*    Assessment/Plan: S/P Procedure(s) (LRB): CORONARY ARTERY BYPASS GRAFTING X 4, USING LEFT INTERNAL MAMMARY ARTERY AND RIGHT ENDOSCOPIC HARVESTED GREATER SAPHENOUS VEIN (N/A) ECHOCARDIOGRAM, TRANSESOPHAGEAL (N/A)  CV- NSR, BP remains low- off all drips, on Midodrine 10 mg TID.Aaron Aas no BB at this time in setting of hypotension.. Coox is 61.8 this morning Pulm- wean oxygen as tolerated, CXR with left basilar opacity, likely small pleural effusion Renal- creatinine has been stable, if accurate weight is up about 14 lbs, would benefit from Lasix to facilitate diuresis.. however BP has been prohibitive, K is at 3.7 will supplement.. d/c foley catheter? Expected post operative blood loss anemia, mild Hgb at 9.1 not clinically significant GI- patient not having actual BM, just passing water.. likely some component of mild stool impaction.. BS on minimal to absent on exam.. will check KUB Hypothyroid- continue synthroid  Deconditioning- patient lives alone, has been recommended by PT/OT who recs CIR, patients daughter is in agreement if she qualifies   LOS: 10 days    Gates Kasal, PA-C 02/02/2024 Patient seen and examined, agree with findings noted above Will dc Foley Co-ox ok Continue PT/OT  Landon Pinion C.  Luna Salinas, MD [facility] [phone]

## 2024-02-02 NOTE — Progress Notes (Signed)
 Occupational Therapy Treatment Patient Details Name: Kaitlyn Wright MRN: [id number] DOB: 06-18-1946 Today's Date: 02/02/2024   History of present illness 78 year old woman  who presented with chest pain that she attributed to acid reflux on 5/5. +NSTEMI; LHC with 3v CAD; CABG 5/09; post-op cardiogenic shock and remained intubated until 5/12; Echo EF 60-65%  PMH- remote tobacco abuse, hypothyroidism, anxiety   OT comments  OT session focused on education in sternal precautions and compensatory strategies/techniques for increased independence and safety with grooming and UB dressing tasks while adhering to sternal precautions. Pt verbalized understanding of education but requires Mod cues to adhere to sternal precautions during functional tasks. Pt demonstrates ability to complete grooming tasks with Set up/Supervision and cues and UB dressing with Mod assist and cues. Pt declining bed mobility and OOB activity this day due to having been up earlier in the day. Pt was educated in importance of continued mobility and OOB activity with pt verbalizing understanding but continuing to decline this session. Pt VSS on 2L continuous O2 through nasal cannula throughout session. Pt is making progress toward OT goals, but will benefit from increased participation in OOB activities. Pt will benefit from continued acute skilled OT services to address deficits outlined below and in crease safety and independence with functional tasks. Post acute discharge, pt will benefit from intensive inpatient skilled rehab services > 3 hours per day to maximize rehab potential.       If plan is discharge home, recommend the following:  A little help with walking and/or transfers;A lot of help with bathing/dressing/bathroom;Assistance with cooking/housework;Assist for transportation;Help with stairs or ramp for entrance   Equipment Recommendations  Tub/shower seat    Recommendations for Other Services      Precautions /  Restrictions Precautions Precautions: Sternal Recall of Precautions/Restrictions: Impaired Precaution/Restrictions Comments: Pt able to state no pushing/pulling and to hug heart pillwo when coughing and during transfers. Needed reeducation in other precautions. Restrictions Other Position/Activity Restrictions: sternal precautions       Mobility Bed Mobility Overal bed mobility: Needs Assistance             General bed mobility comments: Pt declined bed mobility this session due to having been OOB earlier in the day with pt continuing to decline following education in the importance of mobility and OOB activity.    Transfers                         Balance                                           ADL either performed or assessed with clinical judgement   ADL Overall ADL's : Needs assistance/impaired Eating/Feeding: Modified independent;Bed level   Grooming: Set up;Supervision/safety;Cueing for compensatory techniques;Bed level)           Upper Body Dressing : Moderate assistance;Bed level;Cueing for compensatory techniques (cues to adhere to sternal precautions)                     General ADL Comments: Pt with decreasd activity tolerance and with self-limiting behaviors this session.    Extremity/Trunk Assessment Upper Extremity Assessment Upper Extremity Assessment: Generalized weakness (tested within sternal precautions)   Lower Extremity Assessment Lower Extremity Assessment: Defer to PT evaluation        Vision  Perception     Praxis     Communication Communication Communication: No apparent difficulties   Cognition Arousal: Alert Behavior During Therapy: WFL for tasks assessed/performed Cognition: Cognition impaired     Awareness: Intellectual awareness intact, Online awareness intact Memory impairment (select all impairments): Short-term memory Attention impairment (select first level of  impairment): Selective attention Executive functioning impairment (select all impairments): Reasoning, Problem solving OT - Cognition Comments: Pt AAOx4 and pleasant throughout session, but with deficits noted above and with self-limiting behavior.                 Following commands: Intact        Cueing   Cueing Techniques: Verbal cues, Gestural cues, Tactile cues  Exercises      Shoulder Instructions       General Comments Pt with self-limiting behavior this session. Pt repeatedly quested if it was "too early" after surgery to be getting OOB and participating in funcitonal tasks. OT and RN each reeducated pt in importance of early mobility, OOB activity, and practicing compensatory techniques/strategies for ADLs to adhere to sternal precautions prior to discharge. However, pt continued to decline OOB activity this session and showed poor carryover of education throughout session. OT answered pt questions regarding post-acute OT options. VSS on 2L continuous O2 through nasal cannula throughout session. RN presetn during a portion of session.    Pertinent Vitals/ Pain       Pain Assessment Pain Assessment: No/denies pain Pain Intervention(s): Monitored during session  Home Living                                          Prior Functioning/Environment              Frequency  Min 2X/week        Progress Toward Goals  OT Goals(current goals can now be found in the care plan section)  Progress towards OT goals: Progressing toward goals  Acute Rehab OT Goals Patient Stated Goal: to return home to see her dogs  Plan      Co-evaluation                 AM-PAC OT "6 Clicks" Daily Activity     Outcome Measure   Help from another person eating meals?: None Help from another person taking care of personal grooming?: A Little Help from another person toileting, which includes using toliet, bedpan, or urinal?: A Lot Help from another person  bathing (including washing, rinsing, drying)?: A Lot Help from another person to put on and taking off regular upper body clothing?: A Lot Help from another person to put on and taking off regular lower body clothing?: A Lot 6 Click Score: 15    End of Session Equipment Utilized During Treatment: Oxygen  OT Visit Diagnosis: Other (comment);Unsteadiness on feet (R26.81) (decerased activity tolerance)   Activity Tolerance Other (comment) (Pt with self-limiting behaviors)   Patient Left in bed;with call bell/phone within reach;with bed alarm set   Nurse Communication Mobility status;Other (comment) (Pt declining bed mobility and OOB activity this session.)        Time: 1340-1400 OT Time Calculation (min): 20 min  Charges: OT General Charges $OT Visit: 1 Visit OT Treatments $Self Care/Home Management : 8-22 mins  Dorina Ribaudo "Darral Ellis., OTR/L, MA Acute Rehab [phone]   Walt Gunner 02/02/2024, 4:49 PM

## 2024-02-02 NOTE — Progress Notes (Signed)
 EVENING ROUNDS NOTE :     [address]       [place] [address]             [phone]                 6 Days Post-Op Procedure(s) (LRB): CORONARY ARTERY BYPASS GRAFTING X 4, USING LEFT INTERNAL MAMMARY ARTERY AND RIGHT ENDOSCOPIC HARVESTED GREATER SAPHENOUS VEIN (N/A) ECHOCARDIOGRAM, TRANSESOPHAGEAL (N/A)   Total Length of Stay:  LOS: 10 days  Events:   No events Stable day    BP (!) 122/56   Pulse 85   Temp 98.4 F (36.9 C) (Oral)   Resp (!) 22   Ht 5\' 3"  (1.6 m)   Wt 63.1 kg   SpO2 93%   BMI 24.64 kg/m   CVP:  [0 mmHg-24 mmHg] 6 mmHg      epinephrine  3 mcg/min (02/02/24 2000)    I/O last 3 completed shifts: In: 1034.7 [P.O.:820; I.V.:214.7] Out: 3720 [Urine:3720]      Latest Ref Rng & Units 02/02/2024    5:27 AM 01/31/2024    4:27 AM 01/30/2024   10:36 AM  CBC  WBC 4.0 - 10.5 K/uL 10.9  19.9    Hemoglobin 12.0 - 15.0 g/dL 9.1  91.4  78.2   Hematocrit 36.0 - 46.0 % 28.5  29.9  32.0   Platelets 150 - 400 K/uL 196  154         Latest Ref Rng & Units 02/02/2024    5:27 AM 02/01/2024    4:23 AM 01/31/2024    4:27 AM  BMP  Glucose 70 - 99 mg/dL 96  956  213   BUN 8 - 23 mg/dL 16  16  13    Creatinine 0.44 - 1.00 mg/dL 0.86  5.78  4.69   Sodium 135 - 145 mmol/L 137  135  135   Potassium 3.5 - 5.1 mmol/L 3.7  3.7  3.4   Chloride 98 - 111 mmol/L 100  102  99   CO2 22 - 32 mmol/L 29  26  27    Calcium  8.9 - 10.3 mg/dL 8.1  9.1  8.2     ABG    Component Value Date/Time   PHART 7.453 (H) 01/30/2024 1036   PCO2ART 34.4 01/30/2024 1036   PO2ART 117 (H) 01/30/2024 1036   HCO3 23.9 01/30/2024 1036   TCO2 25 01/30/2024 1036   ACIDBASEDEF 4.0 (H) 01/29/2024 2050   O2SAT 68 02/02/2024 1722       Starleen Eastern, MD 02/02/2024 9:29 PM

## 2024-02-02 NOTE — Plan of Care (Signed)
  Problem: Education: Goal: Understanding of CV disease, CV risk reduction, and recovery process will improve Outcome: Progressing   Problem: Activity: Goal: Ability to return to baseline activity level will improve Outcome: Progressing   Problem: Cardiovascular: Goal: Ability to achieve and maintain adequate cardiovascular perfusion will improve Outcome: Progressing   Problem: Health Behavior/Discharge Planning: Goal: Ability to safely manage health-related needs after discharge will improve Outcome: Progressing   Problem: Elimination: Goal: Will not experience complications related to bowel motility Outcome: Progressing Goal: Will not experience complications related to urinary retention Outcome: Progressing

## 2024-02-02 NOTE — Progress Notes (Addendum)
 Patient ID: Kaitlyn Wright, female   DOB: 1946/07/21, 78 y.o.   MRN: [id number]     Advanced Heart Failure Rounding Note  Cardiologist: None  Chief Complaint: s/p CABGx4 Subjective:    POD #6 CABG  Co-ox 62. Off Epi this morning, now back 2 on for hypotension.  2L UOP (net negative 1.2L). Weight down 4lbs, still up from pre-op.  Lying in bed, sleeping. No SOB.   Objective:    Weight Range: 63.1 kg Body mass index is 24.64 kg/m.   Vital Signs:   Temp:  [97.7 F (36.5 C)-97.8 F (36.6 C)] 97.8 F (36.6 C) (05/15 1100) Pulse Rate:  [75-94] 85 (05/15 1000) Resp:  [13-31] 14 (05/15 1200) BP: (82-120)/(41-70) 107/47 (05/15 1200) SpO2:  [71 %-100 %] 89 % (05/15 1000) Arterial Line BP: (64-177)/(35-105) 70/47 (05/15 1200) Weight:  [63.1 kg] 63.1 kg (05/15 0500) Last BM Date : 02/02/24  Weight change: Filed Weights   01/31/24 0500 02/01/24 0703 02/02/24 0500  Weight: 63.4 kg 65 kg 63.1 kg    Intake/Output:  Intake/Output Summary (Last 24 hours) at 02/02/2024 1316 Last data filed at 02/02/2024 1000 Gross per 24 hour  Intake 591.45 ml  Output 1195 ml  Net -603.55 ml    Physical Exam    CVP 11 General: Elderly, frail appearing. No distress on [place] Cardiac: JVP elevated. S1 and S2 present. No murmurs or rub. Extremities: Warm and dry.  Trace BLE edema.  Neuro: Alert and oriented x3. Affect pleasant. Lines/Devices:  RIJ introducer, aline removed.   Telemetry   SR 90 (personally reviewed)  Labs    CBC Recent Labs    01/31/24 0427 02/02/24 0527  WBC 19.9* 10.9*  HGB 10.1* 9.1*  HCT 29.9* 28.5*  MCV 93.4 96.9  PLT 154 196   Basic Metabolic Panel Recent Labs    62/95/28 0423 02/02/24 0527  NA 135 137  K 3.7 3.7  CL 102 100  CO2 26 29  GLUCOSE 165* 96  BUN 16 16  CREATININE 0.93 0.88  CALCIUM  9.1 8.1*   Imaging   DG Abd 1 View Result Date: 02/02/2024 CLINICAL DATA:  Constipation EXAM: ABDOMEN - 1 VIEW COMPARISON:  None Available. FINDINGS: The bowel  gas pattern is normal. No radio-opaque calculi or other significant radiographic abnormality are seen. IMPRESSION: Negative.  No evidence of constipation Electronically Signed   By: Fredrich Jefferson M.D.   On: 02/02/2024 09:55   DG Chest Port 1 View Result Date: 02/02/2024 CLINICAL DATA:  Status post CABG. EXAM: PORTABLE CHEST 1 VIEW COMPARISON:  01/31/2024 FINDINGS: The cardio pericardial silhouette is enlarged. Left base collapse/consolidation with probable effusion is similar to prior. There is a tiny right pleural effusion. Linear right parahilar density suggests atelectasis or scarring. Right IJ central line tip overlies the proximal SVC level. No acute bony abnormality. Telemetry leads overlie the chest. IMPRESSION: 1. No substantial change. 2. Left base collapse/consolidation with probable effusion, similar to prior. 3. Tiny right pleural effusion. Electronically Signed   By: Donnal Fusi M.D.   On: 02/02/2024 07:52    Medications:    Scheduled Medications:  ALPRAZolam   1 mg Oral QHS   amiodarone  400 mg Oral BID   aspirin  EC  325 mg Oral Daily   atorvastatin   80 mg Oral q1800   bisacodyl  10 mg Oral Daily   Or   bisacodyl  10 mg Rectal Daily   Chlorhexidine  Gluconate Cloth  6 each Topical Daily   enoxaparin (LOVENOX)  injection  40 mg Subcutaneous Q24H   feeding supplement  1 Container Oral TID BM   hydrOXYzine   10 mg Oral QHS   insulin  aspart  0-15 Units Subcutaneous TID WC   insulin  aspart  0-5 Units Subcutaneous QHS   latanoprost   1 drop Both Eyes QHS   levothyroxine   88 mcg Oral Q0600   metoCLOPramide (REGLAN) injection  10 mg Intravenous Q6H   midodrine  10 mg Oral TID WC   pantoprazole   40 mg Oral QHS   polyethylene glycol  17 g Oral Daily   senna-docusate  1 tablet Oral BID   Infusions:  PRN Medications: ALPRAZolam , hydrOXYzine , metoprolol  tartrate, morphine injection, naphazoline-glycerin , ondansetron  (ZOFRAN ) IV, mouth rinse, oxyCODONE, simethicone,  traMADol  Assessment/Plan   1. CAD: Patient presented with NSTEMI and cath showing diffuse coronary disease as above.  Now s/p CABG x 4 on 01/27/24 - No s/s angina - Continue ASA + atorvastatin .  - Likely add Plavix prior to d/c  2. Post-op cardiogenic shock due to RV failure: Suspect component of vasoplegia.  Echo pre-op with EF 60-65%, normal RV.   - Co-ox 61. CVP 11. Volume above pre-op.  - give IV Lasix 80 mg today, replace K. - echo 5/12 EF 60-65% RV moderately HK.  - repeat echo with EF 60-65 and normal-low RV  3. Acute hypoxemic respiratory failure:  - Extubated 5/12. On [place] - Mobilize. Encourage IS - diuresis as above  4. Hypokalemia - supp  Needs to ambulate today. Working with PT.  CRITICAL CARE Performed by: [place] Lee  Total critical care time: 11 minutes  Critical care time was exclusive of separately billable procedures and treating other patients.  Critical care was necessary to treat or prevent imminent or life-threatening deterioration.  Critical care was time spent personally by me on the following activities: development of treatment plan with patient and/or surrogate as well as nursing, discussions with consultants, evaluation of patient's response to treatment, examination of patient, obtaining history from patient or surrogate, ordering and performing treatments and interventions, ordering and review of laboratory studies, ordering and review of radiographic studies, pulse oximetry and re-evaluation of patient's condition.  Length of Stay: 10  [place] Lee, NP  02/02/2024, 1:16 PM  Advanced Heart Failure Team Pager [phone] (M-F; 7a - 5p)  Please contact [facility] for night-coverage after hours (5p -7a ) and weekends on [email or web address]  Agree with above  Epi stopped overnight but resumed this am for low BP. Co-ox 61% CVP 11.   Feels ok. Diuresing well but still about 14 pounds above pre-op weight.   Denies CP or SOB  General:  Elderly No resp  difficulty HEENT: normal Neck: supple. nJVP to jaw . Carotids 2+ bilat; no bruits. No lymphadenopathy or thryomegaly appreciated. Cor: sternal wound ok Regular rate & rhythm.2/6 TR. Lungs: clear Abdomen: soft, nontender, nondistended. No hepatosplenomegaly. No bruits or masses. Good bowel sounds. Extremities: no cyanosis, clubbing, rash, 1-2+ edema Neuro: alert & orientedx3, cranial nerves grossly intact. moves all 4 extremities w/o difficulty. Affect pleasant  Continues to struggle with RV failure. Would continue low-dose epi until fully diuresed. Follow CVP and co-ox.  Ambulate. Encourage IS  CRITICAL CARE Performed by: Jules Oar  Total critical care time: 42 minutes  Critical care time was exclusive of separately billable procedures and treating other patients.  Critical care was necessary to treat or prevent imminent or life-threatening deterioration.  Critical care was time spent personally by me (independent of midlevel providers or residents)  on the following activities: development of treatment plan with patient and/or surrogate as well as nursing, discussions with consultants, evaluation of patient's response to treatment, examination of patient, obtaining history from patient or surrogate, ordering and performing treatments and interventions, ordering and review of laboratory studies, ordering and review of radiographic studies, pulse oximetry and re-evaluation of patient's condition.  Jules Oar, MD  8:02 PM

## 2024-02-03 DIAGNOSIS — I214 Non-ST elevation (NSTEMI) myocardial infarction: Secondary | ICD-10-CM | POA: Diagnosis not present

## 2024-02-03 LAB — BASIC METABOLIC PANEL WITH GFR
Anion gap: 7 (ref 5–15)
BUN: 16 mg/dL (ref 8–23)
CO2: 31 mmol/L (ref 22–32)
Calcium: 8.1 mg/dL — ABNORMAL LOW (ref 8.9–10.3)
Chloride: 99 mmol/L (ref 98–111)
Creatinine, Ser: 0.98 mg/dL (ref 0.44–1.00)
GFR, Estimated: 58 mL/min — ABNORMAL LOW (ref >60)
Glucose, Bld: 134 mg/dL — ABNORMAL HIGH (ref 70–99)
Potassium: 3.8 mmol/L (ref 3.5–5.1)
Sodium: 137 mmol/L (ref 135–145)

## 2024-02-03 LAB — COOXEMETRY PANEL
Carboxyhemoglobin: 1.8 % — ABNORMAL HIGH (ref 0.5–1.5)
Methemoglobin: <0.7 % (ref 0.0–1.5)
O2 Saturation: 73 %
Total hemoglobin: 9.4 g/dL — ABNORMAL LOW (ref 12.0–16.0)

## 2024-02-03 LAB — GLUCOSE, CAPILLARY
Glucose-Capillary: 136 mg/dL — ABNORMAL HIGH (ref 70–99)
Glucose-Capillary: 79 mg/dL (ref 70–99)
Glucose-Capillary: 78 mg/dL (ref 70–99)
Glucose-Capillary: 120 mg/dL — ABNORMAL HIGH (ref 70–99)

## 2024-02-03 MED ORDER — FUROSEMIDE 10 MG/ML IJ SOLN
60.0000 mg | Freq: Once | INTRAMUSCULAR | Status: AC
  Administered 2024-02-03: 60 mg via INTRAVENOUS
  Filled 2024-02-03: qty 6

## 2024-02-03 MED ORDER — MAGIC MOUTHWASH W/LIDOCAINE
10.0000 mL | Freq: Three times a day (TID) | ORAL | Status: DC | PRN
  Administered 2024-02-03 – 2024-02-04 (×2): 10 mL via ORAL
  Filled 2024-02-03 (×3): qty 10

## 2024-02-03 MED ORDER — MENTHOL 3 MG MT LOZG
1.0000 | LOZENGE | OROMUCOSAL | Status: DC | PRN
  Administered 2024-02-03 – 2024-02-06 (×3): 3 mg via ORAL
  Filled 2024-02-03 (×4): qty 9

## 2024-02-03 NOTE — Progress Notes (Addendum)
      [address]       [place] [address]             [phone]      7 Days Post-Op Procedure(s) (LRB): CORONARY ARTERY BYPASS GRAFTING X 4, USING LEFT INTERNAL MAMMARY ARTERY AND RIGHT ENDOSCOPIC HARVESTED GREATER SAPHENOUS VEIN (N/A) ECHOCARDIOGRAM, TRANSESOPHAGEAL (N/A)  Subjective:  Patient feeling better today.  She does have a new complaint of a sore throat and request lozenges.  Denies N/V  Objective: Vital signs in last 24 hours: Temp:  [97.8 F (36.6 C)-99.1 F (37.3 C)] 98.5 F (36.9 C) (05/16 0746) Pulse Rate:  [59-97] 97 (05/16 0746) Cardiac Rhythm: Normal sinus rhythm (05/15 2100) Resp:  [13-28] 18 (05/16 0746) BP: (79-137)/(38-102) 101/60 (05/16 0746) SpO2:  [72 %-99 %] 96 % (05/16 0746) Arterial Line BP: (94)/(55) 94/55 (05/15 1000) Weight:  [61.5 kg] 61.5 kg (05/16 0645)  Hemodynamic parameters for last 24 hours: CVP:  [1 mmHg-69 mmHg] 69 mmHg  Intake/Output from previous day: 05/15 0701 - 05/16 0700 In: 497.5 [P.O.:340; I.V.:157.5] Out: 2270 [Urine:2270]  General appearance: alert, cooperative, and no distress ENT: left throat with red papular dots, ulceration/white plaque present Heart: regular rate and rhythm Lungs: clear to auscultation bilaterally Abdomen: soft, non-tender; bowel sounds normal; no masses,  no organomegaly Extremities: edema trace Wound: clean and dry  Lab Results: Recent Labs    02/02/24 0527  WBC 10.9*  HGB 9.1*  HCT 28.5*  PLT 196   BMET:  Recent Labs    02/02/24 0527 02/03/24 0520  NA 137 137  K 3.7 3.8  CL 100 99  CO2 29 31  GLUCOSE 96 134*  BUN 16 16  CREATININE 0.88 0.98  CALCIUM  8.1* 8.1*    PT/INR: No results for input(s): "LABPROT", "INR" in the last 72 hours. ABG    Component Value Date/Time   PHART 7.453 (H) 01/30/2024 1036   HCO3 23.9 01/30/2024 1036   TCO2 25 01/30/2024 1036   ACIDBASEDEF 4.0 (H) 01/29/2024 2050   O2SAT 73 02/03/2024 0520   CBG (last 3)  Recent Labs     02/02/24 1812 02/02/24 2056 02/03/24 0745  GLUCAP 149* 114* 136*    Assessment/Plan: S/P Procedure(s) (LRB): CORONARY ARTERY BYPASS GRAFTING X 4, USING LEFT INTERNAL MAMMARY ARTERY AND RIGHT ENDOSCOPIC HARVESTED GREATER SAPHENOUS VEIN (N/A) ECHOCARDIOGRAM, TRANSESOPHAGEAL (N/A)  CV- NSR, on Amiodarone tablet 400 mg BID, restarted on Epi@3  yesterday afternoon by AHF, remains on Midodrine 10 mg TID Pulm- no acute issues, weaning oxygen as tolerated Renal- creatinine has been stable, weight is mildly elevated.. BP has been prohibitive of diuretics Dysphagia- sore throat, start Cepacol prn, Magic mouthwash... there was a white ulceration/plaque in throat... ? Early thrush? Hypothyroidism- continue synthroid  Deconditioning- continue PT/OT.Aaron Aas plan is for CIR at time of discharge if felt to be a candidate and approval is obtained Continue ICU care   LOS: 11 days    Gates Kasal, PA-C 02/03/2024  Agree  On low dose epi Continue PT  [place] [employer] O [place]

## 2024-02-03 NOTE — TOC Progression Note (Signed)
 Transition of Care [facility]) - Progression Note    Patient Details  Name: Rea Arizola MRN: [id number] Date of Birth: 1946/04/09  Transition of Care [facility]) CM/SW Contact  Ernst Heap Phone Number: [phone] 02/03/2024, 1:20 PM  Clinical Narrative:   [facility] CSW will continue to follow and monitor [facility] bed availability closer to when the patient is medically ready for dc. CSW also faxed the patient out to [facility] inpatient [facility]. Pending status. Will update.   TOC will continue following.     Expected Discharge Plan: Skilled Nursing Facility Barriers to Discharge: Continued Medical Work up  Expected Discharge Plan and Services   Discharge Planning Services: CM Consult Post Acute Care Choice: Skilled Nursing Facility Living arrangements for the past 2 months: Apartment                                       Social Determinants of Health (SDOH) Interventions SDOH Screenings   Food Insecurity: No Food Insecurity (02/03/2024)  Housing: Low Risk  (01/25/2024)  Transportation Needs: No Transportation Needs (01/25/2024)  Utilities: Not At Risk (01/25/2024)  Social Connections: Unknown (02/03/2024)  Tobacco Use: Low Risk  (01/27/2024)    Readmission Risk Interventions     No data to display

## 2024-02-03 NOTE — Progress Notes (Signed)
 Physical Therapy Treatment Patient Details Name: Kaitlyn Wright MRN: [id number] DOB: 1946-06-07 Today's Date: 02/03/2024   History of Present Illness 78 year old woman  who presented with chest pain that she attributed to acid reflux on 5/5. +NSTEMI; LHC with 3v CAD; CABG 5/09; post-op cardiogenic shock and remained intubated until 5/12; Echo EF 60-65%  PMH- remote tobacco abuse, hypothyroidism, anxiety    PT Comments  Pt in recliner upon arrival and agreeable to PT session. Pt was able to progress in today's session by needing less assistance to stand and to ambulate. Pt also increased gait distance. She was able to stand with MinAx2 for safety and able ambulate two sets of 80 ft with MinAx2 for safety and chair follow. Pt had difficulty managing RW and required assist for RW management due to veering right/left. One standing rest break due to fatigue before being able to ambulate back to the room. Pt is progressing well towards goals. Continue to recommend >3hrs post acute rehab. Acute PT to follow.   HR 101-110 BPM BP 143/73 SpO2 >98% on 2L     If plan is discharge home, recommend the following: Assistance with cooking/housework;Assist for transportation     Equipment Recommendations  Rolling walker (2 wheels)    Recommendations for Other Services Rehab consult     Precautions / Restrictions Precautions Precautions: Sternal Recall of Precautions/Restrictions: Impaired Precaution/Restrictions Comments: able to verbalize precautions, however, needed cues for adherance during transfers Restrictions Other Position/Activity Restrictions: sternal precautions     Mobility  Bed Mobility Overal bed mobility: Needs Assistance    General bed mobility comments: in recliner upon arrival    Transfers Overall transfer level: Needs assistance Equipment used: None Transfers: Sit to/from Stand Sit to Stand: Min assist, +2 safety/equipment    General transfer comment: MinA for slight  boost-up with x2 for safety. Cues to have  hands on knees with pt attempting to reach for RW.    Ambulation/Gait Ambulation/Gait assistance: Min assist, +2 safety/equipment Gait Distance (Feet): 80 Feet (x80, x80) Assistive device: Rolling walker (2 wheels) Gait Pattern/deviations: Step-through pattern, Decreased stride length, Drifts right/left Gait velocity: dec     General Gait Details: veers right/left with MinA needed to steer RW. Close chair follow for safety with tech pushing IV pole         Balance Overall balance assessment: Needs assistance Sitting-balance support: Feet supported Sitting balance-Leahy Scale: Fair     Standing balance support: Bilateral upper extremity supported Standing balance-Leahy Scale: Poor Standing balance comment: relinant on external support        Communication Communication Communication: No apparent difficulties  Cognition Arousal: Alert Behavior During Therapy: WFL for tasks assessed/performed   PT - Cognitive impairments: No apparent impairments    Following commands: Intact      Cueing Cueing Techniques: Verbal cues, Gestural cues, Tactile cues  Exercises General Exercises - Lower Extremity Hip Flexion/Marching: AROM, 20 reps, Seated        Pertinent Vitals/Pain Pain Assessment Pain Assessment: Faces Faces Pain Scale: Hurts little more Pain Location: sore throat Pain Descriptors / Indicators: Aching, Discomfort Pain Intervention(s): Monitored during session, Limited activity within patient's tolerance, Repositioned     PT Goals (current goals can now be found in the care plan section) Acute Rehab PT Goals PT Goal Formulation: With patient Time For Goal Achievement: 02/14/24 Potential to Achieve Goals: Good Progress towards PT goals: Progressing toward goals    Frequency    Min 2X/week       AM-PAC PT "  6 Clicks" Mobility   Outcome Measure  Help needed turning from your back to your side while in a flat  bed without using bedrails?: A Little Help needed moving from lying on your back to sitting on the side of a flat bed without using bedrails?: A Lot Help needed moving to and from a bed to a chair (including a wheelchair)?: Total Help needed standing up from a chair using your arms (e.g., wheelchair or bedside chair)?: Total Help needed to walk in hospital room?: Total Help needed climbing 3-5 steps with a railing? : Total 6 Click Score: 9    End of Session Equipment Utilized During Treatment: Gait belt;Oxygen Activity Tolerance: Patient tolerated treatment well Patient left: in chair;with call bell/phone within reach;with family/visitor present Nurse Communication: Mobility status (RN present during session) PT Visit Diagnosis: Muscle weakness (generalized) (M62.81);Difficulty in walking, not elsewhere classified (R26.2)     Time: 2956-2130 PT Time Calculation (min) (ACUTE ONLY): 30 min  Charges:    $Gait Training: 8-22 mins $Therapeutic Activity: 8-22 mins PT General Charges $$ ACUTE PT VISIT: 1 Visit                     Kaitlyn Wright, PT, DPT Secure Chat Preferred  Rehab Office [phone]    Kaitlyn Wright 02/03/2024, 10:09 AM

## 2024-02-03 NOTE — Progress Notes (Addendum)
 Patient ID: Kaitlyn Wright, female   DOB: 12/02/1945, 78 y.o.   MRN: [id number]     Advanced Heart Failure Rounding Note  Cardiologist: None  Chief Complaint: s/p CABGx4 Subjective:    POD #7 CABG  Co-ox 73. Stable off Epi.    -2.2L UOP (net negative 1.7L). Weight down 4lbs, still up 10 lbs from pre-op   Sitting up in chair. Feels good today. Denies CP/SOB.   Objective:    Weight Range: 61.5 kg Body mass index is 24.02 kg/m.   Vital Signs:   Temp:  [97.8 F (36.6 C)-99.1 F (37.3 C)] 97.8 F (36.6 C) (05/16 1113) Pulse Rate:  [59-97] 97 (05/16 0746) Resp:  [13-26] 18 (05/16 0746) BP: (79-137)/(38-102) 101/60 (05/16 0746) SpO2:  [72 %-99 %] 96 % (05/16 0746) Weight:  [61.5 kg] 61.5 kg (05/16 0645) Last BM Date : 02/02/24  Weight change: Filed Weights   02/01/24 0703 02/02/24 0500 02/03/24 0645  Weight: 65 kg 63.1 kg 61.5 kg    Intake/Output:  Intake/Output Summary (Last 24 hours) at 02/03/2024 1125 Last data filed at 02/03/2024 0900 Gross per 24 hour  Intake 270.14 ml  Output 2200 ml  Net -1929.86 ml    Physical Exam    General:  elderly appearing.  No respiratory difficulty. Sitting up in chair.  Neck: supple. JVD ~8 cm. RIJ introducer Cor: PMI nondisplaced. Regular rate & rhythm. No rubs, gallops or murmurs. Lungs: clear Extremities: no cyanosis, clubbing, rash, +1 BLE edema  Neuro: alert & oriented x 3. Moves all 4 extremities w/o difficulty. Affect pleasant.   Telemetry   SR 80 (personally reviewed)  Labs    CBC Recent Labs    02/02/24 0527  WBC 10.9*  HGB 9.1*  HCT 28.5*  MCV 96.9  PLT 196   Basic Metabolic Panel Recent Labs    78/29/56 0527 02/03/24 0520  NA 137 137  K 3.7 3.8  CL 100 99  CO2 29 31  GLUCOSE 96 134*  BUN 16 16  CREATININE 0.88 0.98  CALCIUM  8.1* 8.1*   Imaging   No results found.   Medications:    Scheduled Medications:  ALPRAZolam   1 mg Oral QHS   amiodarone  400 mg Oral BID   aspirin  EC  325 mg Oral  Daily   atorvastatin   80 mg Oral q1800   bisacodyl  10 mg Oral Daily   Or   bisacodyl  10 mg Rectal Daily   Chlorhexidine  Gluconate Cloth  6 each Topical Daily   enoxaparin (LOVENOX) injection  40 mg Subcutaneous Q24H   feeding supplement  1 Container Oral TID BM   hydrOXYzine   10 mg Oral QHS   insulin  aspart  0-15 Units Subcutaneous TID WC   insulin  aspart  0-5 Units Subcutaneous QHS   latanoprost   1 drop Both Eyes QHS   levothyroxine   88 mcg Oral Q0600   midodrine  10 mg Oral TID WC   pantoprazole   40 mg Oral QHS   polyethylene glycol  17 g Oral Daily   senna-docusate  1 tablet Oral BID   Infusions:  epinephrine  2 mcg/min (02/03/24 0900)   PRN Medications: ALPRAZolam , hydrOXYzine , magic mouthwash w/lidocaine , menthol-cetylpyridinium, metoprolol  tartrate, morphine injection, naphazoline-glycerin , ondansetron  (ZOFRAN ) IV, mouth rinse, oxyCODONE, simethicone, traMADol  Assessment/Plan   1. CAD: Patient presented with NSTEMI and cath showing diffuse coronary disease as above.  Now s/p CABG x 4 on 01/27/24 - No s/s angina - Continue ASA + atorvastatin .  - Likely  add Plavix prior to d/c  2. Post-op cardiogenic shock due to RV failure: Suspect component of vasoplegia.  Echo pre-op with EF 60-65%, normal RV.   - Co-ox 73. CVP 7. One dose of lasix today 60 IV.  - Now stable off Epi - echo 5/12 EF 60-65% RV moderately HK.  - echo 5/14 EF 60-65 and normal-low RV  3. Acute hypoxemic respiratory failure:  - Extubated 5/12. On [place] - Mobilize. Encourage IS - diuresis as above  4. Hypokalemia - supp as needed - 3.8 today  Continue to ambulate.   Length of Stay: 11  Sheryl Donna, NP  02/03/2024, 11:25 AM  Advanced Heart Failure Team Pager [phone] (M-F; 7a - 5p)  Please contact [facility] for night-coverage after hours (5p -7a ) and weekends on [email or web address]  Patient seen and examined with the above-signed Advanced Practice Provider and/or Housestaff. I personally reviewed  laboratory data, imaging studies and relevant notes. I independently examined the patient and formulated the important aspects of the plan. I have edited the note to reflect any of my changes or salient points. I have personally discussed the plan with the patient and/or family.  Now off EPI. Co-ox stable at 73% CVP 7 (checked personally)  Remains in NSR.   Denies CP or SOB.   Weight still up about 15 pounds.  Renal function stable. K 3.8  General:  Sitting up in bed  No resp difficulty HEENT: normal Neck: supple. JVP 7  C Cor: sternal wound ok  Lungs: clear Abdomen: soft, nontender, nondistended. No hepatosplenomegaly. No bruits or masses. Good bowel sounds. Extremities: no cyanosis, clubbing, rash, 1+ edema Neuro: alert & orientedx3, cranial nerves grossly intact. moves all 4 extremities w/o difficulty. Affect pleasant  Still struggling with RV failure but epi now off.   Continue diuresis. Follow CVP and co-ox closely. Can restart as needed.   Refuses compression hose. Will use SCDs.   Continue to ambulate.   Jules Oar, MD  7:53 PM

## 2024-02-03 NOTE — Progress Notes (Signed)
 Patient ID: Kaitlyn Wright, female   DOB: [birth year], 78 y.o.   MRN: [id number]  TCTS Evening Rounds:  Hemodynamically stable in sinus rhythm.   Good diuresis today. -1485 cc so far.

## 2024-02-03 NOTE — Progress Notes (Signed)
 Inpatient Rehab Admissions Coordinator:   Met with patient at bedside to review recommendations from Dr. Rachel Budds for 24/7 supervision and options.  Pt does not have 24/7 supervision at home, confirmed based on my conversation with her daughter.  Pt would benefit from intense rehab, but I am unable to offer her a bed based on our PMR physician's recommendation.  Other rehab venues should be pursued and I've discussed with TOC.    Loye Rumble, PT, DPT Admissions Coordinator [phone] 02/03/24  3:22 PM

## 2024-02-04 DIAGNOSIS — I214 Non-ST elevation (NSTEMI) myocardial infarction: Secondary | ICD-10-CM | POA: Diagnosis not present

## 2024-02-04 LAB — COOXEMETRY PANEL
Carboxyhemoglobin: 2 % — ABNORMAL HIGH (ref 0.5–1.5)
Methemoglobin: <0.7 % (ref 0.0–1.5)
O2 Saturation: 70 %
Total hemoglobin: 9.9 g/dL — ABNORMAL LOW (ref 12.0–16.0)

## 2024-02-04 LAB — GLUCOSE, CAPILLARY
Glucose-Capillary: 153 mg/dL — ABNORMAL HIGH (ref 70–99)
Glucose-Capillary: 57 mg/dL — ABNORMAL LOW (ref 70–99)
Glucose-Capillary: 95 mg/dL (ref 70–99)
Glucose-Capillary: 103 mg/dL — ABNORMAL HIGH (ref 70–99)
Glucose-Capillary: 88 mg/dL (ref 70–99)

## 2024-02-04 LAB — BASIC METABOLIC PANEL WITH GFR
Anion gap: 10 (ref 5–15)
Anion gap: 8 (ref 5–15)
BUN: 22 mg/dL (ref 8–23)
BUN: 23 mg/dL (ref 8–23)
CO2: 31 mmol/L (ref 22–32)
CO2: 32 mmol/L (ref 22–32)
Calcium: 8.3 mg/dL — ABNORMAL LOW (ref 8.9–10.3)
Calcium: 8.8 mg/dL — ABNORMAL LOW (ref 8.9–10.3)
Chloride: 96 mmol/L — ABNORMAL LOW (ref 98–111)
Chloride: 96 mmol/L — ABNORMAL LOW (ref 98–111)
Creatinine, Ser: 0.97 mg/dL (ref 0.44–1.00)
Creatinine, Ser: 0.86 mg/dL (ref 0.44–1.00)
GFR, Estimated: 59 mL/min — ABNORMAL LOW (ref >60)
GFR, Estimated: >60 mL/min (ref >60)
Glucose, Bld: 148 mg/dL — ABNORMAL HIGH (ref 70–99)
Glucose, Bld: 115 mg/dL — ABNORMAL HIGH (ref 70–99)
Potassium: 3.8 mmol/L (ref 3.5–5.1)
Potassium: 3.8 mmol/L (ref 3.5–5.1)
Sodium: 137 mmol/L (ref 135–145)
Sodium: 136 mmol/L (ref 135–145)

## 2024-02-04 LAB — CBC
HCT: 29.9 % — ABNORMAL LOW (ref 36.0–46.0)
HCT: 31.7 % — ABNORMAL LOW (ref 36.0–46.0)
Hemoglobin: 9.8 g/dL — ABNORMAL LOW (ref 12.0–15.0)
Hemoglobin: 10.1 g/dL — ABNORMAL LOW (ref 12.0–15.0)
MCH: 32 pg (ref 26.0–34.0)
MCH: 30.9 pg (ref 26.0–34.0)
MCHC: 32.8 g/dL (ref 30.0–36.0)
MCHC: 31.9 g/dL (ref 30.0–36.0)
MCV: 97.7 fL (ref 80.0–100.0)
MCV: 96.9 fL (ref 80.0–100.0)
Platelets: 380 10*3/uL (ref 150–400)
Platelets: 399 10*3/uL (ref 150–400)
RBC: 3.06 MIL/uL — ABNORMAL LOW (ref 3.87–5.11)
RBC: 3.27 MIL/uL — ABNORMAL LOW (ref 3.87–5.11)
RDW: 14.6 % (ref 11.5–15.5)
RDW: 14.7 % (ref 11.5–15.5)
WBC: 16.3 10*3/uL — ABNORMAL HIGH (ref 4.0–10.5)
WBC: 16.4 10*3/uL — ABNORMAL HIGH (ref 4.0–10.5)
nRBC: 0 % (ref 0.0–0.2)
nRBC: 0 % (ref 0.0–0.2)

## 2024-02-04 LAB — MAGNESIUM: Magnesium: 1.7 mg/dL (ref 1.7–2.4)

## 2024-02-04 MED ORDER — MAGNESIUM SULFATE 2 GM/50ML IV SOLN
2.0000 g | Freq: Once | INTRAVENOUS | Status: AC
  Administered 2024-02-04: 2 g via INTRAVENOUS
  Filled 2024-02-04: qty 50

## 2024-02-04 MED ORDER — PHENYLEPHRINE CONCENTRATED 100MG/250ML (0.4 MG/ML) INFUSION SIMPLE: 0.0000 ug/min | INTRAVENOUS | Status: DC

## 2024-02-04 MED ORDER — ALPRAZOLAM 0.5 MG PO TABS
0.5000 mg | ORAL_TABLET | Freq: Every day | ORAL | Status: DC
  Administered 2024-02-04 – 2024-02-05 (×2): 0.5 mg via ORAL
  Filled 2024-02-04 (×2): qty 1

## 2024-02-04 MED ORDER — PHENYLEPHRINE HCL-NACL 20-0.9 MG/250ML-% IV SOLN: 0.0000 ug/min | INTRAVENOUS | Status: DC

## 2024-02-04 MED ORDER — ALPRAZOLAM 0.25 MG PO TABS
0.2500 mg | ORAL_TABLET | Freq: Every day | ORAL | Status: DC | PRN
  Administered 2024-02-07: 0.25 mg via ORAL
  Filled 2024-02-04: qty 1

## 2024-02-04 MED ORDER — AMIODARONE HCL 200 MG PO TABS
200.0000 mg | ORAL_TABLET | Freq: Two times a day (BID) | ORAL | Status: DC
  Administered 2024-02-04 – 2024-02-12 (×18): 200 mg via ORAL
  Filled 2024-02-04 (×18): qty 1

## 2024-02-04 MED ORDER — TORSEMIDE 20 MG PO TABS
20.0000 mg | ORAL_TABLET | Freq: Every day | ORAL | Status: DC
  Administered 2024-02-04: 20 mg via ORAL
  Filled 2024-02-04 (×2): qty 1

## 2024-02-04 NOTE — Progress Notes (Signed)
 8 Days Post-Op Procedure(s) (LRB): CORONARY ARTERY BYPASS GRAFTING X 4, USING LEFT INTERNAL MAMMARY ARTERY AND RIGHT ENDOSCOPIC HARVESTED GREATER SAPHENOUS VEIN (N/A) ECHOCARDIOGRAM, TRANSESOPHAGEAL (N/A) Subjective:  No complaints.  Has still required vasopressor to support BP. Epi has been titrated up and down. When she is awake her pressure is ok and when asleep it drops to 80's. She is on midodrine  10 tid.  Objective: Vital signs in last 24 hours: Temp:  [97.7 F (36.5 C)-98.3 F (36.8 C)] 98.2 F (36.8 C) (05/17 0700) Pulse Rate:  [75-236] 82 (05/17 1045) Cardiac Rhythm: Normal sinus rhythm (05/16 2200) Resp:  [11-26] 14 (05/17 1045) BP: (85-128)/(37-63) 98/44 (05/17 1045) SpO2:  [90 %-100 %] 96 % (05/17 1045)  Hemodynamic parameters for last 24 hours: CVP:  [0 mmHg-9 mmHg] 8 mmHg  Intake/Output from previous day: 05/16 0701 - 05/17 0700 In: 229.3 [P.O.:120; I.V.:109.3] Out: 1800 [Urine:1800] Intake/Output this shift: No intake/output data recorded.  General appearance: alert and cooperative Heart: regular rate and rhythm Lungs: clear to auscultation bilaterally Extremities: extremities normal, atraumatic, no cyanosis or edema Wound: incision healing well  Lab Results: Recent Labs    02/02/24 0527 02/04/24 0445  WBC 10.9* 16.3*  HGB 9.1* 9.8*  HCT 28.5* 29.9*  PLT 196 380   BMET:  Recent Labs    02/03/24 0520 02/04/24 0445  NA 137 137  K 3.8 3.8  CL 99 96*  CO2 31 31  GLUCOSE 134* 148*  BUN 16 22  CREATININE 0.98 0.97  CALCIUM  8.1* 8.3*    PT/INR: No results for input(s): "LABPROT", "INR" in the last 72 hours. ABG    Component Value Date/Time   PHART 7.453 (H) 01/30/2024 1036   HCO3 23.9 01/30/2024 1036   TCO2 25 01/30/2024 1036   ACIDBASEDEF 4.0 (H) 01/29/2024 2050   O2SAT 70 02/04/2024 0448   CBG (last 3)  Recent Labs    02/03/24 1614 02/03/24 2149 02/04/24 0641  GLUCAP 78 120* 153*    Assessment/Plan: S/P Procedure(s)  (LRB): CORONARY ARTERY BYPASS GRAFTING X 4, USING LEFT INTERNAL MAMMARY ARTERY AND RIGHT ENDOSCOPIC HARVESTED GREATER SAPHENOUS VEIN (N/A) ECHOCARDIOGRAM, TRANSESOPHAGEAL (N/A)  POD 8  Hemodynamically labile on midodrine  and titrating epi. Will stop Epi since Co-ox is 70 and EF normal. DC cental line from surgery. Use peripheral neo if needed. Continue midodrine .  Maintaining sinus rhythm. Decrease amio to 200 bid.  Wt is still 10 lbs over preop but does not look edematous and with borderline BP will hold off on further diuresis.  Continue IS, ambulation.  No hx of DM and normal Hgb A1c preop. DC SSI.   Continue IS, ambulation.   LOS: 12 days    Bartley Lightning 02/04/2024

## 2024-02-04 NOTE — Progress Notes (Signed)
 PT Cancellation Note  Patient Details Name: Kaitlyn Wright MRN: [id number] DOB: Oct 11, 1945   Cancelled Treatment:    Reason Eval/Treat Not Completed:  Noted new PT eval order. Pt already on PT caseload with acute PT to continuing to follow. Current recommendations for CIR with updated TOC note on 5/16 discussing referral out to another in-patient rehab and SNF. Acute PT to follow.  [name], PT, DPT Secure Chat Preferred  Rehab Office [phone]  [name] 02/04/2024, 8:04 AM

## 2024-02-04 NOTE — Progress Notes (Signed)
 Patient ID: Kaitlyn Wright, female   DOB: [birth year], 78 y.o.   MRN: [id number]     Advanced Heart Failure Rounding Note  Cardiologist: None  Chief Complaint: s/p CABGx4 Subjective:    POD #8 CABG  Was on epi yesterday for RV support and hypotension. Now on midodrine  10 tid. Co-ox 70%  Denies CP or SOB.   Diuresing well. Weight still up 10 pounds (? Accurate)   Objective:    Weight Range: 61.5 kg Body mass index is 24.02 kg/m.   Vital Signs:   Temp:  [98.1 F (36.7 C)-98.3 F (36.8 C)] 98.1 F (36.7 C) (05/17 1130) Pulse Rate:  [75-236] 78 (05/17 1400) Resp:  [11-26] 16 (05/17 1400) BP: (85-128)/(37-68) 120/51 (05/17 1400) SpO2:  [86 %-99 %] 96 % (05/17 1400) Last BM Date : 02/03/24  Weight change: Filed Weights   02/01/24 0703 02/02/24 0500 02/03/24 0645  Weight: 65 kg 63.1 kg 61.5 kg    Intake/Output:  Intake/Output Summary (Last 24 hours) at 02/04/2024 1638 Last data filed at 02/04/2024 0600 Gross per 24 hour  Intake 214.66 ml  Output 300 ml  Net -85.34 ml    Physical Exam    General:  Elderly. No resp difficulty HEENT: normal Neck: supple. JVP 5-6. Carotids 2+ bilat; no bruits. No lymphadenopathy or thryomegaly appreciated. Cor: Sternal wound ok Regular rate & rhythm. No rubs, gallops or murmurs. Lungs: clear Abdomen: soft, nontender, nondistended. No hepatosplenomegaly. No bruits or masses. Good bowel sounds. Extremities: no cyanosis, clubbing, rash, tr-1+edema Neuro: alert & orientedx3, cranial nerves grossly intact. moves all 4 extremities w/o difficulty. Affect pleasant   Telemetry   SR 70-80s  (personally reviewed)  Labs    CBC Recent Labs    02/04/24 0445 02/04/24 1424  WBC 16.3* 16.4*  HGB 9.8* 10.1*  HCT 29.9* 31.7*  MCV 97.7 96.9  PLT 380 399   Basic Metabolic Panel Recent Labs    40/98/11 0445 02/04/24 1424  NA 137 136  K 3.8 3.8  CL 96* 96*  CO2 31 32  GLUCOSE 148* 115*  BUN 22 23  CREATININE 0.97 0.86  CALCIUM  8.3*  8.8*  MG  --  1.7   Imaging   No results found.   Medications:    Scheduled Medications:  ALPRAZolam   0.5 mg Oral QHS   amiodarone   200 mg Oral BID   aspirin  EC  325 mg Oral Daily   atorvastatin   80 mg Oral q1800   bisacodyl   10 mg Oral Daily   Or   bisacodyl   10 mg Rectal Daily   Chlorhexidine  Gluconate Cloth  6 each Topical Daily   enoxaparin  (LOVENOX ) injection  40 mg Subcutaneous Q24H   feeding supplement  1 Container Oral TID BM   hydrOXYzine   10 mg Oral QHS   latanoprost   1 drop Both Eyes QHS   levothyroxine   88 mcg Oral Q0600   midodrine   10 mg Oral TID WC   pantoprazole   40 mg Oral QHS   polyethylene glycol  17 g Oral Daily   senna-docusate  1 tablet Oral BID   Infusions:  phenylephrine  (NEO-SYNEPHRINE) Adult infusion     PRN Medications: ALPRAZolam , hydrOXYzine , magic mouthwash w/lidocaine , menthol -cetylpyridinium, naphazoline-glycerin , ondansetron  (ZOFRAN ) IV, mouth rinse, oxyCODONE , simethicone , traMADol   Assessment/Plan   1. CAD: Patient presented with NSTEMI and cath showing diffuse coronary disease as above.  Now s/p CABG x 4 on 01/27/24 - No s/s angina - Continue ASA + atorvastatin .  - Likely add Plavix  prior to d/c  2. Post-op cardiogenic shock due to RV failure: Suspect component of vasoplegia.  Echo pre-op with EF 60-65%, normal RV.   - Has been on/off low-dose epi - Now off epi co-ox 70% CVP  - echo 5/12 EF 60-65% RV moderately HK.  - echo 5/14 EF 60-65 and normal-low RV - BP labile  - Will start torsemide 20 daily - Watch in unit overnight. If SBP stable can go to floor in am   3. Acute hypoxemic respiratory failure:  - Extubated 5/12. On [place] - Resolved - Mobilize. Encourage IS  4. Hypokalemia - supp as needed - K 3.8 today   Length of Stay: 12  Jules Oar, MD  02/04/2024, 4:38 PM  Advanced Heart Failure Team Pager [phone] (M-F; 7a - 5p)  Please contact [facility] for night-coverage after hours (5p -7a ) and weekends on  [email or web address]

## 2024-02-04 NOTE — Progress Notes (Signed)
 Patient ID: Kaitlyn Wright, female   DOB: [birth year], 78 y.o.   MRN: [id number]  TCTS Evening Rounds:  Hemodynamically stable today. BP drops some when asleep.   Sats 94% on 1L.   UO ok

## 2024-02-05 ENCOUNTER — Other Ambulatory Visit: Payer: Self-pay

## 2024-02-05 DIAGNOSIS — I214 Non-ST elevation (NSTEMI) myocardial infarction: Secondary | ICD-10-CM | POA: Diagnosis not present

## 2024-02-05 LAB — GLUCOSE, CAPILLARY
Glucose-Capillary: 76 mg/dL (ref 70–99)
Glucose-Capillary: 108 mg/dL — ABNORMAL HIGH (ref 70–99)

## 2024-02-05 MED ORDER — SENNOSIDES-DOCUSATE SODIUM 8.6-50 MG PO TABS
1.0000 | ORAL_TABLET | Freq: Two times a day (BID) | ORAL | Status: DC | PRN
Start: 2024-02-05 — End: 2024-02-15

## 2024-02-05 MED ORDER — ~~LOC~~ CARDIAC SURGERY, PATIENT & FAMILY EDUCATION: Freq: Once | Status: AC

## 2024-02-05 MED ORDER — OXYCODONE HCL 5 MG PO TABS
5.0000 mg | ORAL_TABLET | ORAL | Status: DC | PRN
  Filled 2024-02-05: qty 1

## 2024-02-05 MED ORDER — TRAMADOL HCL 50 MG PO TABS
50.0000 mg | ORAL_TABLET | Freq: Four times a day (QID) | ORAL | Status: DC | PRN
  Administered 2024-02-05 – 2024-02-13 (×8): 50 mg via ORAL
  Filled 2024-02-05 (×8): qty 1

## 2024-02-05 MED ORDER — SODIUM CHLORIDE 0.9% FLUSH
3.0000 mL | Freq: Two times a day (BID) | INTRAVENOUS | Status: DC
  Administered 2024-02-05 – 2024-02-15 (×20): 3 mL via INTRAVENOUS

## 2024-02-05 MED ORDER — SODIUM CHLORIDE 0.9 % IV SOLN: 250.0000 mL | INTRAVENOUS | Status: AC | PRN

## 2024-02-05 MED ORDER — SODIUM CHLORIDE 0.9% FLUSH: 3.0000 mL | INTRAVENOUS | Status: DC | PRN

## 2024-02-05 MED ORDER — ACETAMINOPHEN 325 MG PO TABS
650.0000 mg | ORAL_TABLET | Freq: Four times a day (QID) | ORAL | Status: DC | PRN
  Administered 2024-02-05 – 2024-02-14 (×10): 650 mg via ORAL
  Filled 2024-02-05 (×12): qty 2

## 2024-02-05 NOTE — Progress Notes (Signed)
 Patient ID: Kaitlyn Wright, female   DOB: 1945/12/29, 78 y.o.   MRN: [id number]     Advanced Heart Failure Rounding Note  Cardiologist: None  Chief Complaint: s/p CABGx4 Subjective:    POD #9 CABG  BP stable on midorine. Denies CP or SOB. BAck is sore  Objective:    Weight Range: 62.9 kg Body mass index is 24.56 kg/m.   Vital Signs:   Temp:  [97.6 F (36.4 C)-100.2 F (37.9 C)] 98.4 F (36.9 C) (05/18 0800) Pulse Rate:  [75-91] 75 (05/18 0600) Resp:  [14-24] 16 (05/18 0900) BP: (91-134)/(40-80) 99/44 (05/18 0900) SpO2:  [90 %-98 %] 92 % (05/18 0600) Weight:  [62.9 kg] 62.9 kg (05/18 0500) Last BM Date : 02/03/24  Weight change: Filed Weights   02/02/24 0500 02/03/24 0645 02/05/24 0500  Weight: 63.1 kg 61.5 kg 62.9 kg    Intake/Output:  Intake/Output Summary (Last 24 hours) at 02/05/2024 1201 Last data filed at 02/05/2024 0800 Gross per 24 hour  Intake 410.07 ml  Output 1350 ml  Net -939.93 ml    Physical Exam    General:  Elderly. No resp difficulty HEENT: normal Neck: supple. no JVD. Carotids 2+ bilat; no bruits. No lymphadenopathy or thryomegaly appreciated. Cor: Sternal wound ok. Regular rate & rhythm. No rubs, gallops or murmurs. Lungs: clear Abdomen: soft, nontender, nondistended. No hepatosplenomegaly. No bruits or masses. Good bowel sounds. Extremities: no cyanosis, clubbing, rash, edema Neuro: alert & orientedx3, cranial nerves grossly intact. moves all 4 extremities w/o difficulty. Affect pleasant  Telemetry   SR 70-80s  (personally reviewed)  Labs    CBC Recent Labs    02/04/24 0445 02/04/24 1424  WBC 16.3* 16.4*  HGB 9.8* 10.1*  HCT 29.9* 31.7*  MCV 97.7 96.9  PLT 380 399   Basic Metabolic Panel Recent Labs    40/98/11 0445 02/04/24 1424  NA 137 136  K 3.8 3.8  CL 96* 96*  CO2 31 32  GLUCOSE 148* 115*  BUN 22 23  CREATININE 0.97 0.86  CALCIUM  8.3* 8.8*  MG  --  1.7   Imaging   No results found.   Medications:     Scheduled Medications:  ALPRAZolam   0.5 mg Oral QHS   amiodarone   200 mg Oral BID   aspirin  EC  325 mg Oral Daily   atorvastatin   80 mg Oral q1800   [facility] Patient [facility] Family Education   Does not apply Once   enoxaparin  (LOVENOX ) injection  40 mg Subcutaneous Q24H   feeding supplement  1 Container Oral TID BM   latanoprost   1 drop Both Eyes QHS   levothyroxine   88 mcg Oral Q0600   midodrine   10 mg Oral TID WC   pantoprazole   40 mg Oral QHS   sodium chloride  flush  3 mL Intravenous Q12H   Infusions:  sodium chloride      PRN Medications: sodium chloride , acetaminophen , ALPRAZolam , menthol -cetylpyridinium, naphazoline-glycerin , mouth rinse, oxyCODONE , senna-docusate, sodium chloride  flush, traMADol   Assessment/Plan   1. CAD: Patient presented with NSTEMI and cath showing diffuse coronary disease as above.  Now s/p CABG x 4 on 01/27/24 - No s/s angina - Continue ASA + atorvastatin .  - Likely add Plavix prior to d/c  2. Post-op cardiogenic shock due to RV failure: Suspect component of vasoplegia.  Echo pre-op with EF 60-65%, normal RV.   - Has been on/off low-dose epi - Now off epi. Vitals stable - echo 5/12 EF 60-65% RV moderately HK.  -  echo 5/14 EF 60-65 and normal-low RV - BP stable on midodrine  - Volume status ok  - Will decrease torsemide to 20mg  MWF  3. Acute hypoxemic respiratory failure:  - Extubated 5/12. On [place] - Resolved - Continue to mobilize. Use IS  4. Hypokalemia - supp as needed  Ok for floor  Length of Stay: 13  Jules Oar, MD  02/05/2024, 12:01 PM  Advanced Heart Failure Team Pager [phone] (M-F; 7a - 5p)  Please contact [facility] for night-coverage after hours (5p -7a ) and weekends on [email or web address]

## 2024-02-05 NOTE — Progress Notes (Signed)
 9 Days Post-Op Procedure(s) (LRB): CORONARY ARTERY BYPASS GRAFTING X 4, USING LEFT INTERNAL MAMMARY ARTERY AND RIGHT ENDOSCOPIC HARVESTED GREATER SAPHENOUS VEIN (N/A) ECHOCARDIOGRAM, TRANSESOPHAGEAL (N/A) Subjective: No specific complaints. Getting ready to walk with PT.  Objective: Vital signs in last 24 hours: Temp:  [97.6 F (36.4 C)-100.2 F (37.9 C)] 98.4 F (36.9 C) (05/18 0800) Pulse Rate:  [75-91] 75 (05/18 0600) Cardiac Rhythm: Normal sinus rhythm (05/18 0800) Resp:  [14-24] 16 (05/18 0900) BP: (91-134)/(40-80) 99/44 (05/18 0900) SpO2:  [86 %-98 %] 92 % (05/18 0600) Weight:  [62.9 kg] 62.9 kg (05/18 0500)  Hemodynamic parameters for last 24 hours: CVP:  [5 mmHg-10 mmHg] 6 mmHg  Intake/Output from previous day: 05/17 0701 - 05/18 0700 In: 435.6 [P.O.:360; I.V.:25.6; IV Piggyback:50.1] Out: 1550 [Urine:1550] Intake/Output this shift: Total I/O In: 240 [P.O.:240] Out: -   General appearance: alert and cooperative Neurologic: intact Heart: regular rate and rhythm Lungs: clear to auscultation bilaterally Extremities: no edema Wound: incision healing well  Lab Results: Recent Labs    02/04/24 0445 02/04/24 1424  WBC 16.3* 16.4*  HGB 9.8* 10.1*  HCT 29.9* 31.7*  PLT 380 399   BMET:  Recent Labs    02/04/24 0445 02/04/24 1424  NA 137 136  K 3.8 3.8  CL 96* 96*  CO2 31 32  GLUCOSE 148* 115*  BUN 22 23  CREATININE 0.97 0.86  CALCIUM  8.3* 8.8*    PT/INR: No results for input(s): "LABPROT", "INR" in the last 72 hours. ABG    Component Value Date/Time   PHART 7.453 (H) 01/30/2024 1036   HCO3 23.9 01/30/2024 1036   TCO2 25 01/30/2024 1036   ACIDBASEDEF 4.0 (H) 01/29/2024 2050   O2SAT 70 02/04/2024 0448   CBG (last 3)  Recent Labs    02/04/24 1620 02/04/24 2119 02/05/24 0627  GLUCAP 103* 88 76    Assessment/Plan: S/P Procedure(s) (LRB): CORONARY ARTERY BYPASS GRAFTING X 4, USING LEFT INTERNAL MAMMARY ARTERY AND RIGHT ENDOSCOPIC HARVESTED  GREATER SAPHENOUS VEIN (N/A) ECHOCARDIOGRAM, TRANSESOPHAGEAL (N/A)  POD 9  Hemodynamically stable with more stable BP on midodrine .  Maintaining NSR on oral amio.  Wt is at preop if accurate. -1114 cc yesterday. Not sure she needs further diuresis.  Will transfer to 4E and continue mobilization.   Need to decide on post hospitalization rehab. Not a CIR candidate due to lack of 24 hr supervision after discharge.   LOS: 13 days    Bartley Lightning 02/05/2024

## 2024-02-05 NOTE — Progress Notes (Signed)
 Occupational Therapy Treatment Patient Details Name: Kaitlyn Wright MRN: [id number] DOB: 1946/08/08 Today's Date: 02/05/2024   History of present illness 78 year old woman  who presented with chest pain that she attributed to acid reflux on 5/5. +NSTEMI; LHC with 3v CAD; CABG 5/09; post-op cardiogenic shock and remained intubated until 5/12; Echo EF 60-65%  PMH- remote tobacco abuse, hypothyroidism, anxiety   OT comments  Patient with good progress toward patient focused goals.  Improving with general mobility, closer to Min A at RW level with O2 and frequent standing rest breaks.  Mod A for lower body AD from an sit to stand level.  Increased encouragement to release one hand at a time from RW for pant management.  OT will continue efforts in the acute setting to address deficits, and Patient will benefit from intensive inpatient follow-up therapy, >3 hours/day continues to be recommended given patient's active lifestyle and ability to return to a Mod I level.        If plan is discharge home, recommend the following:  A little help with walking and/or transfers;A lot of help with bathing/dressing/bathroom;Assistance with cooking/housework;Assist for transportation;Help with stairs or ramp for entrance   Equipment Recommendations  Tub/shower seat    Recommendations for Other Services      Precautions / Restrictions Precautions Precautions: Sternal Precaution Booklet Issued: No Recall of Precautions/Restrictions: Impaired Precaution/Restrictions Comments: able to verbalize precautions, however, needed cues for adherance during transfers Restrictions Weight Bearing Restrictions Per Provider Order: Yes Other Position/Activity Restrictions: sternal precautions       Mobility Bed Mobility Overal bed mobility: Needs Assistance Bed Mobility: Supine to Sit     Supine to sit: Mod assist       Patient Response: Cooperative  Transfers Overall transfer level: Modified  independent Equipment used: Rolling walker (2 wheels) Transfers: Sit to/from Stand, Bed to chair/wheelchair/BSC Sit to Stand: Min assist, Mod assist     Step pivot transfers: Min assist           Balance Overall balance assessment: Needs assistance Sitting-balance support: Feet supported Sitting balance-Leahy Scale: Fair     Standing balance support: Bilateral upper extremity supported Standing balance-Leahy Scale: Poor                             ADL either performed or assessed with clinical judgement   ADL       Grooming: Set up;Sitting           Upper Body Dressing : Moderate assistance;Sitting   Lower Body Dressing: Moderate assistance;Sit to/from stand   Toilet Transfer: Minimal Holiday representative;Ambulation;Rolling walker (2 wheels)                  Extremity/Trunk Assessment Upper Extremity Assessment Upper Extremity Assessment: Generalized weakness   Lower Extremity Assessment Lower Extremity Assessment: Defer to PT evaluation   Cervical / Trunk Assessment Cervical / Trunk Assessment: Normal    Vision Patient Visual Report: No change from baseline     Perception Perception Perception: Not tested   Praxis Praxis Praxis: Not tested   Communication Communication Communication: No apparent difficulties   Cognition Arousal: Alert Behavior During Therapy: WFL for tasks assessed/performed Cognition: Cognition impaired       Memory impairment (select all impairments): Short-term memory   Executive functioning impairment (select all impairments): Reasoning, Problem solving                   Following commands: Intact  Cueing   Cueing Techniques: Verbal cues, Gestural cues, Tactile cues  Exercises      Shoulder Instructions       General Comments      Pertinent Vitals/ Pain       Pain Assessment Pain Assessment: Faces Faces Pain Scale: Hurts little more Pain Location: R wrist Pain  Descriptors / Indicators: Aching, Discomfort Pain Intervention(s): Monitored during session                                                          Frequency  Min 2X/week        Progress Toward Goals  OT Goals(current goals can now be found in the care plan section)  Progress towards OT goals: Progressing toward goals  Acute Rehab OT Goals OT Goal Formulation: With patient Time For Goal Achievement: 02/14/24 Potential to Achieve Goals: Good  Plan      Co-evaluation                 AM-PAC OT "6 Clicks" Daily Activity     Outcome Measure   Help from another person eating meals?: None Help from another person taking care of personal grooming?: A Little Help from another person toileting, which includes using toliet, bedpan, or urinal?: A Lot Help from another person bathing (including washing, rinsing, drying)?: A Lot Help from another person to put on and taking off regular upper body clothing?: A Lot Help from another person to put on and taking off regular lower body clothing?: A Lot 6 Click Score: 15    End of Session Equipment Utilized During Treatment: Rolling walker (2 wheels);Oxygen  OT Visit Diagnosis: Unsteadiness on feet (R26.81);Muscle weakness (generalized) (M62.81)   Activity Tolerance Patient tolerated treatment well   Patient Left in chair;with call bell/phone within reach;with nursing/sitter in room   Nurse Communication Mobility status        Time: 1308-6578 OT Time Calculation (min): 22 min  Charges: OT General Charges $OT Visit: 1 Visit OT Treatments $Self Care/Home Management : 8-22 mins  02/05/2024  RP, OTR/L  Acute Rehabilitation Services  Office:  [phone]   Kaitlyn Wright 02/05/2024, 11:31 AM

## 2024-02-06 ENCOUNTER — Inpatient Hospital Stay (HOSPITAL_COMMUNITY)

## 2024-02-06 DIAGNOSIS — I214 Non-ST elevation (NSTEMI) myocardial infarction: Secondary | ICD-10-CM | POA: Diagnosis not present

## 2024-02-06 LAB — URINALYSIS, ROUTINE W REFLEX MICROSCOPIC
Bilirubin Urine: NEGATIVE
Glucose, UA: NEGATIVE mg/dL
Hgb urine dipstick: NEGATIVE
Ketones, ur: NEGATIVE mg/dL
Nitrite: NEGATIVE
Protein, ur: NEGATIVE mg/dL
Specific Gravity, Urine: 1.006 (ref 1.005–1.030)
pH: 7 (ref 5.0–8.0)

## 2024-02-06 LAB — BASIC METABOLIC PANEL WITH GFR
Anion gap: 13 (ref 5–15)
BUN: 26 mg/dL — ABNORMAL HIGH (ref 8–23)
CO2: 28 mmol/L (ref 22–32)
Calcium: 8.9 mg/dL (ref 8.9–10.3)
Chloride: 96 mmol/L — ABNORMAL LOW (ref 98–111)
Creatinine, Ser: 0.91 mg/dL (ref 0.44–1.00)
GFR, Estimated: >60 mL/min (ref >60)
Glucose, Bld: 83 mg/dL (ref 70–99)
Potassium: 4.1 mmol/L (ref 3.5–5.1)
Sodium: 137 mmol/L (ref 135–145)

## 2024-02-06 LAB — CBC
HCT: 33.1 % — ABNORMAL LOW (ref 36.0–46.0)
Hemoglobin: 10.6 g/dL — ABNORMAL LOW (ref 12.0–15.0)
MCH: 30.7 pg (ref 26.0–34.0)
MCHC: 32 g/dL (ref 30.0–36.0)
MCV: 95.9 fL (ref 80.0–100.0)
Platelets: 507 10*3/uL — ABNORMAL HIGH (ref 150–400)
RBC: 3.45 MIL/uL — ABNORMAL LOW (ref 3.87–5.11)
RDW: 14.5 % (ref 11.5–15.5)
WBC: 15.9 10*3/uL — ABNORMAL HIGH (ref 4.0–10.5)
nRBC: 0 % (ref 0.0–0.2)

## 2024-02-06 MED ORDER — CLOPIDOGREL BISULFATE 75 MG PO TABS
75.0000 mg | ORAL_TABLET | Freq: Every day | ORAL | Status: DC
  Administered 2024-02-06 – 2024-02-15 (×10): 75 mg via ORAL
  Filled 2024-02-06 (×10): qty 1

## 2024-02-06 MED ORDER — ALPRAZOLAM 0.5 MG PO TABS
1.0000 mg | ORAL_TABLET | Freq: Every day | ORAL | Status: DC
  Administered 2024-02-06 – 2024-02-14 (×9): 1 mg via ORAL
  Filled 2024-02-06 (×9): qty 2

## 2024-02-06 MED ORDER — TORSEMIDE 20 MG PO TABS
20.0000 mg | ORAL_TABLET | ORAL | Status: DC
  Administered 2024-02-06: 20 mg via ORAL
  Filled 2024-02-06: qty 1

## 2024-02-06 MED ORDER — GUAIFENESIN ER 600 MG PO TB12: 600.0000 mg | ORAL_TABLET | Freq: Two times a day (BID) | ORAL | Status: DC | PRN

## 2024-02-06 MED ORDER — GUAIFENESIN ER 600 MG PO TB12: 600.0000 mg | ORAL_TABLET | Freq: Two times a day (BID) | ORAL | Status: DC

## 2024-02-06 MED ORDER — ASPIRIN 81 MG PO TBEC
81.0000 mg | DELAYED_RELEASE_TABLET | Freq: Every day | ORAL | Status: DC
  Administered 2024-02-06 – 2024-02-15 (×10): 81 mg via ORAL
  Filled 2024-02-06 (×10): qty 1

## 2024-02-06 MED ORDER — POTASSIUM CHLORIDE CRYS ER 20 MEQ PO TBCR
20.0000 meq | EXTENDED_RELEASE_TABLET | ORAL | Status: DC
  Administered 2024-02-06: 20 meq via ORAL
  Filled 2024-02-06: qty 1

## 2024-02-06 NOTE — Progress Notes (Signed)
 Physical Therapy Treatment Patient Details Name: Kaitlyn Wright MRN: [id number] DOB: 04-28-1946 Today's Date: 02/06/2024   History of Present Illness 78 year old woman  who presented with chest pain that she attributed to acid reflux on 5/5. +NSTEMI; LHC with 3v CAD; CABG 5/09; post-op cardiogenic shock and remained intubated until 5/12; Echo EF 60-65%  PMH- remote tobacco abuse, hypothyroidism, anxiety    PT Comments  Patient reporting too much is expected of her and we need to understand that she is "old." Pt deferred OOB activity and agreed to bed level exercises. (See below for details). Educated that the goal is to walk 3x/day. She stated her goal is 2x/day. She relays that she is very anxious about falling and does not feel well when she walks. Agrees to ambulate with this therapist 5/20.     If plan is discharge home, recommend the following: Assistance with cooking/housework;Assist for transportation;A little help with walking and/or transfers;A little help with bathing/dressing/bathroom   Can travel by private vehicle        Equipment Recommendations  Rolling walker (2 wheels)    Recommendations for Other Services       Precautions / Restrictions Precautions Precautions: Sternal Precaution Booklet Issued: No Recall of Precautions/Restrictions: Impaired     Mobility  Bed Mobility                    Transfers                   General transfer comment: pt refused    Ambulation/Gait               General Gait Details: pt refused   Stairs             Wheelchair Mobility     Tilt Bed    Modified Rankin (Stroke Patients Only)       Balance                                            Communication Communication Communication: No apparent difficulties  Cognition Arousal: Alert Behavior During Therapy: WFL for tasks assessed/performed   PT - Cognitive impairments: No apparent impairments                          Following commands: Intact      Cueing Cueing Techniques: Verbal cues, Gestural cues, Tactile cues  Exercises General Exercises - Lower Extremity Ankle Circles/Pumps: AROM, 10 reps Heel Slides: AROM, Both, 15 reps Hip ABduction/ADduction: AROM, Both, 15 reps Straight Leg Raises: AROM, Both, 10 reps Other Exercises Other Exercises: bridging x 10; monitored no pushing through UEs    General Comments        Pertinent Vitals/Pain Pain Assessment Pain Assessment: Faces Faces Pain Scale: Hurts little more Pain Location: rt heel Pain Descriptors / Indicators: Discomfort, Sore Pain Intervention(s): Limited activity within patient's tolerance, Monitored during session    Home Living                          Prior Function            PT Goals (current goals can now be found in the care plan section) Acute Rehab PT Goals Patient Stated Goal: return home Time For Goal Achievement: 02/14/24 Potential to Achieve Goals: Good Progress towards  PT goals: Progressing toward goals    Frequency    Min 2X/week      PT Plan      Co-evaluation              AM-PAC PT "6 Clicks" Mobility   Outcome Measure  Help needed turning from your back to your side while in a flat bed without using bedrails?: A Little Help needed moving from lying on your back to sitting on the side of a flat bed without using bedrails?: A Lot Help needed moving to and from a bed to a chair (including a wheelchair)?: Total Help needed standing up from a chair using your arms (e.g., wheelchair or bedside chair)?: Total Help needed to walk in hospital room?: Total Help needed climbing 3-5 steps with a railing? : Total 6 Click Score: 9    End of Session Equipment Utilized During Treatment: Oxygen Activity Tolerance: Patient tolerated treatment well Patient left: with call bell/phone within reach;in bed Nurse Communication: Other (comment) (refused mobility; did ex's) PT Visit  Diagnosis: Muscle weakness (generalized) (M62.81);Difficulty in walking, not elsewhere classified (R26.2)     Time: 1610-9604 PT Time Calculation (min) (ACUTE ONLY): 25 min  Charges:    $Therapeutic Exercise: 23-37 mins PT General Charges $$ ACUTE PT VISIT: 1 Visit                      Gayle Kava, PT Acute Rehabilitation Services  Office [phone]    [name] 02/06/2024, 2:51 PM

## 2024-02-06 NOTE — TOC Progression Note (Addendum)
 Transition of Care [facility]) - Progression Note    Patient Details  Name: Kaitlyn Wright MRN: [id number] Date of Birth: [birth year]  Transition of [facility]) CM/SW Contact  Ernst Heap Phone Number: [phone] 02/06/2024, 12:37 PM  Clinical Narrative:   12:24 PM- HF CSW attempted to meet with patient at bedside.Patient was sleeping. CSW will meet with patient at a more appropriate time.   12:27 PM- HF CSW called and spoke with the patients daughter over the phone. Patients daughter inquired about the SNF process. CSW explained the SNF process and updated her on the accepted bed offers list. CSW emailed the list to [email or web address].  Patients daughter stated that her [facility] is not an option. Will review the list. Patients daughter stated that she will be out of town 5/24-5/31.   HF CSW will continue to follow and monitor [facility] bed availability closer to when the patient is medically ready for dc   TOC will continue following.    Expected Discharge Plan: Skilled Nursing Facility Barriers to Discharge: Continued Medical Work up  Expected Discharge Plan and Services   Discharge Planning Services: CM Consult Post Acute Care Choice: Skilled Nursing Facility Living arrangements for the past 2 months: Apartment                                       Social Determinants of Health (SDOH) Interventions SDOH Screenings   Food Insecurity: No Food Insecurity (02/03/2024)  Housing: Low Risk  (01/25/2024)  Transportation Needs: No Transportation Needs (01/25/2024)  Utilities: Not At Risk (01/25/2024)  Social Connections: Moderately Integrated (02/05/2024)  Tobacco Use: Low Risk  (01/27/2024)    Readmission Risk Interventions     No data to display

## 2024-02-06 NOTE — Plan of Care (Signed)
  Problem: Education: Goal: Understanding of CV disease, CV risk reduction, and recovery process will improve Outcome: Progressing   Problem: Activity: Goal: Ability to return to baseline activity level will improve Outcome: Progressing   Problem: Cardiovascular: Goal: Ability to achieve and maintain adequate cardiovascular perfusion will improve Outcome: Progressing   Problem: Education: Goal: Knowledge of General Education information will improve Description: Including pain rating scale, medication(s)/side effects and non-pharmacologic comfort measures Outcome: Progressing   Problem: Elimination: Goal: Will not experience complications related to bowel motility Outcome: Progressing Goal: Will not experience complications related to urinary retention Outcome: Progressing   Problem: Safety: Goal: Ability to remain free from injury will improve Outcome: Progressing   Problem: Skin Integrity: Goal: Risk for impaired skin integrity will decrease Outcome: Progressing   Problem: Clinical Measurements: Goal: Respiratory complications will improve Outcome: Not Progressing

## 2024-02-06 NOTE — Progress Notes (Addendum)
 Patient ID: Kaitlyn Wright, female   DOB: [birth year], 78 y.o.   MRN: [id number]     Advanced Heart Failure Rounding Note  Cardiologist: None  Chief Complaint: s/p CABGx4 Subjective:    POD #10 CABG  No I/Os. Weight significantly down, 2 lb above admission weight.  SBP >100 overnight  Sitting up in bed. Feeling well. No SOB. Working with PT.   Objective:    Weight Range: 57.9 kg Body mass index is 22.6 kg/m.   Vital Signs:   Temp:  [98 F (36.7 C)-98.6 F (37 C)] 98.6 F (37 C) (05/19 0810) Pulse Rate:  [77-88] 84 (05/19 0810) Resp:  [15-22] 18 (05/19 0433) BP: (94-134)/(43-97) 105/50 (05/19 0810) SpO2:  [90 %-98 %] 98 % (05/19 0810) Weight:  [57.9 kg] 57.9 kg (05/19 0433) Last BM Date : 02/03/24  Weight change: Filed Weights   02/03/24 0645 02/05/24 0500 02/06/24 0433  Weight: 61.5 kg 62.9 kg 57.9 kg    Intake/Output:  Intake/Output Summary (Last 24 hours) at 02/06/2024 0939 Last data filed at 02/06/2024 0816 Gross per 24 hour  Intake --  Output 350 ml  Net -350 ml    Physical Exam   General: Frail appearing. No distress on [place] Cardiac: JVP flat. S1 and S2 present. No murmurs or rub. Extremities: Warm and dry.  No peripheral edema.  Neuro: Alert and oriented x3. Affect pleasant.   Telemetry   SR in the 80s (personally reviewed)   Labs    CBC Recent Labs    02/04/24 0445 02/04/24 1424  WBC 16.3* 16.4*  HGB 9.8* 10.1*  HCT 29.9* 31.7*  MCV 97.7 96.9  PLT 380 399   Basic Metabolic Panel Recent Labs    08/65/78 1424 02/06/24 0403  NA 136 137  K 3.8 4.1  CL 96* 96*  CO2 32 28  GLUCOSE 115* 83  BUN 23 26*  CREATININE 0.86 0.91  CALCIUM  8.8* 8.9  MG 1.7  --    Imaging   No results found.  Medications:    Scheduled Medications:  ALPRAZolam   1 mg Oral QHS   amiodarone   200 mg Oral BID   aspirin  EC  325 mg Oral Daily   atorvastatin   80 mg Oral q1800   clopidogrel   75 mg Oral Daily   enoxaparin  (LOVENOX ) injection  40 mg Subcutaneous  Q24H   feeding supplement  1 Container Oral TID BM   latanoprost   1 drop Both Eyes QHS   levothyroxine   88 mcg Oral Q0600   midodrine   10 mg Oral TID WC   pantoprazole   40 mg Oral QHS   potassium chloride   20 mEq Oral Q M,W,F   sodium chloride  flush  3 mL Intravenous Q12H   torsemide   20 mg Oral Q M,W,F   Infusions:  sodium chloride      PRN Medications: sodium chloride , acetaminophen , ALPRAZolam , menthol -cetylpyridinium, naphazoline-glycerin , mouth rinse, oxyCODONE , senna-docusate, sodium chloride  flush, traMADol   Assessment/Plan   1. CAD: Patient presented with NSTEMI and cath showing diffuse coronary disease as above.  Now s/p CABG x 4 on 01/27/24 - No s/s angina - Continue ASA + atorvastatin .  - Likely add Plavix  prior to d/c  2. Post-op cardiogenic shock due to RV failure: Suspect component of vasoplegia.  Echo pre-op with EF 60-65%, normal RV.   - echo 5/12 EF 60-65% RV moderately HK.  - echo 5/14 EF 60-65 and normal-low RV - BP stable on midodrine  10 mg tid, BP not high enough to wean.  -  Volume status ok  - Continue torsemide  to 20mg  MWF  3. Acute hypoxemic respiratory failure:  - Resolved - Continue to mobilize. Use IS  4. Hypokalemia - resolved.  Stable from a HF standpoint for rehab.   Length of Stay: 31  [place] Lee, NP  02/06/2024, 9:39 AM  Advanced Heart Failure Team Pager [phone] (M-F; 7a - 5p)  Please contact [facility] for night-coverage after hours (5p -7a ) and weekends on [email or web address]  Patient seen and examined with the above-signed Advanced Practice Provider and/or Housestaff. I personally reviewed laboratory data, imaging studies and relevant notes. I independently examined the patient and formulated the important aspects of the plan. I have edited the note to reflect any of my changes or salient points. I have personally discussed the plan with the patient and/or family.  Walking the halls today with assistance. BP stable on midorine  General:   Elderly frail . No resp difficulty HEENT: normal Neck: supple. no JVD. Carotids 2+ bilat; no bruits. No lymphadenopathy or thryomegaly appreciated. JYN:WGNFAOZ wound ok. Regular rate & rhythm. No rubs, gallops or murmurs. Lungs: clear Abdomen: soft, nontender, nondistended. No hepatosplenomegaly. No bruits or masses. Good bowel sounds. Extremities: no cyanosis, clubbing, rash, edema Neuro: alert & orientedx3, cranial nerves grossly intact. moves all 4 extremities w/o difficulty. Affect pleasant  She looks good today. She is stable for d/c to SNF from our perspective. Continue current regimen. HF team will s/o   Jules Oar, MD  11:36 AM

## 2024-02-06 NOTE — Progress Notes (Addendum)
 [address]       [place] [address]             [phone]      10 Days Post-Op Procedure(s) (LRB): CORONARY ARTERY BYPASS GRAFTING X 4, USING LEFT INTERNAL MAMMARY ARTERY AND RIGHT ENDOSCOPIC HARVESTED GREATER SAPHENOUS VEIN (N/A) ECHOCARDIOGRAM, TRANSESOPHAGEAL (N/A) Subjective: The patient states her only "gripe" this AM is that she cannot hear her TV and her right heel hurts from the SCDs.  Objective: Vital signs in last 24 hours: Temp:  [98 F (36.7 C)-98.5 F (36.9 C)] 98.5 F (36.9 C) (05/19 0433) Pulse Rate:  [77-88] 77 (05/19 0433) Cardiac Rhythm: Normal sinus rhythm (05/18 2101) Resp:  [15-22] 18 (05/19 0433) BP: (94-134)/(43-97) 104/51 (05/19 0433) SpO2:  [90 %-94 %] 90 % (05/19 0433) Weight:  [57.9 kg] 57.9 kg (05/19 0433)  Hemodynamic parameters for last 24 hours:    Intake/Output from previous day: 05/18 0701 - 05/19 0700 In: 240 [P.O.:240] Out: -  Intake/Output this shift: No intake/output data recorded.  General appearance: alert, cooperative, and no distress Neurologic: intact Heart: regular rate and rhythm, S1, S2 normal, no murmur, click, rub or gallop Lungs: slightly diminished bibasilar breath sounds Abdomen: soft, non-tender; bowel sounds normal; no masses,  no organomegaly Extremities: extremities normal, atraumatic, no cyanosis or edema Wound: Clean and dry without sign of infection  Lab Results: Recent Labs    02/04/24 0445 02/04/24 1424  WBC 16.3* 16.4*  HGB 9.8* 10.1*  HCT 29.9* 31.7*  PLT 380 399   BMET:  Recent Labs    02/04/24 1424 02/06/24 0403  NA 136 137  K 3.8 4.1  CL 96* 96*  CO2 32 28  GLUCOSE 115* 83  BUN 23 26*  CREATININE 0.86 0.91  CALCIUM  8.8* 8.9    PT/INR: No results for input(s): "LABPROT", "INR" in the last 72 hours. ABG    Component Value Date/Time   PHART 7.453 (H) 01/30/2024 1036   HCO3 23.9 01/30/2024 1036   TCO2 25 01/30/2024 1036   ACIDBASEDEF 4.0 (H) 01/29/2024  2050   O2SAT 70 02/04/2024 0448   CBG (last 3)  Recent Labs    02/04/24 2119 02/05/24 0627 02/05/24 1126  GLUCAP 88 76 108*    Assessment/Plan: S/P Procedure(s) (LRB): CORONARY ARTERY BYPASS GRAFTING X 4, USING LEFT INTERNAL MAMMARY ARTERY AND RIGHT ENDOSCOPIC HARVESTED GREATER SAPHENOUS VEIN (N/A) ECHOCARDIOGRAM, TRANSESOPHAGEAL (N/A)  CV: Postop cardiogenic shock, AHF team following, appreciate their assistance. SBP 97-134, mostly 100-110s. NSR, HR 70s-80s. On Midodrine  10mg  TID. Hx of NSTEMI, will start Plavix  prior to discharge.   Pulm: Saturating well on 1L [place]. Encourage IS and ambulation. Wean O2 as able. Will get updated CXR.  GI: +BM, tolerating a diet  Endo: No hx of DM, SSI and CBGs have been d/c'd. Hypothyroid, on Levothyroxine   Renal: Cr 0.91, stable. Near preop weight, on Torsemide  20mg  MWF per AHF. K 4.1.   ID: Likely reactive leukocytosis stable, afebrile. Will clinically monitor.   Expected postop ABLA: Last H/H 10.1/31.7, stable. Not clinically significant.   DVT Prophylaxis: Lovenox   Deconditioning: Needs to increase ambulation. PT recommending CIR but not a CIR candidate due to lack of supervision after discharge. CSW following for SNF placement ([facility]) vs other inpatient rehab options.  Dispo: Work on improved ambulation, dispo planning   LOS: 14 days    Randa Burton, PA-C 02/06/2024   Chart reviewed, patient examined, agree with above.  She is  doing well overall and BP is stable on midodrine . Working on SNF.  Will DC SCD's since they are causing some bruising over achilles. Continue Lovenox .

## 2024-02-06 NOTE — Progress Notes (Signed)
 Mobility Specialist Progress Note:    02/06/24 1105  Mobility  Activity Transferred to/from [facility];Ambulated with assistance in room;Ambulated with assistance in hallway  Level of Assistance Minimal assist, patient does 75% or more  Assistive Device Front wheel walker  Distance Ambulated (ft) 200 ft  RUE Weight Bearing Per Provider Order NWB  LUE Weight Bearing Per Provider Order NWB  Activity Response Tolerated well  Mobility Referral Yes  Mobility visit 1 Mobility  Mobility Specialist Start Time (ACUTE ONLY) 1105  Mobility Specialist Stop Time (ACUTE ONLY) 1135  Mobility Specialist Time Calculation (min) (ACUTE ONLY) 30 min   Pt received in bed, agreeable to mobility session. MinA required to sit EOB and stand at bedside while following sternal precautions. Pt requested to use bed pan prior to session, assisted and encouraged pt to use BSC. Tolerated well, void successful. Pt completed own peri- care. Required encouragement to participate at times and requests chair follow for safety.  Monitored O2 throughout. SpO2 91-95% on 6L. Pulse 107 bpm. Returned pt to room, transferred back to bed and left in care of xray transport team.   Zarin Knupp Mobility Specialist Please contact via SecureChat or  Rehab office at [phone]

## 2024-02-06 NOTE — Progress Notes (Signed)
      [address]       Kaitlyn Wright [address]             [phone]      Patient's CXR with left pleural effusion. She was on 2L [place] when I checked on her this afternoon, required 6L [place] during ambulation per mobility specialist's note. Discussed possible thoracentesis but patient does not want a thoracentesis at this time. Continue Torsemide  M,W,F IS, flutter valve, and ambulation.   Randa Burton, PA-C

## 2024-02-07 ENCOUNTER — Inpatient Hospital Stay (HOSPITAL_COMMUNITY)

## 2024-02-07 LAB — BASIC METABOLIC PANEL WITH GFR
Anion gap: 9 (ref 5–15)
BUN: 20 mg/dL (ref 8–23)
CO2: 29 mmol/L (ref 22–32)
Calcium: 9.3 mg/dL (ref 8.9–10.3)
Chloride: 98 mmol/L (ref 98–111)
Creatinine, Ser: 1 mg/dL (ref 0.44–1.00)
GFR, Estimated: 57 mL/min — ABNORMAL LOW (ref >60)
Glucose, Bld: 101 mg/dL — ABNORMAL HIGH (ref 70–99)
Potassium: 4.4 mmol/L (ref 3.5–5.1)
Sodium: 136 mmol/L (ref 135–145)

## 2024-02-07 LAB — CBC
HCT: 32.2 % — ABNORMAL LOW (ref 36.0–46.0)
Hemoglobin: 10.2 g/dL — ABNORMAL LOW (ref 12.0–15.0)
MCH: 30.4 pg (ref 26.0–34.0)
MCHC: 31.7 g/dL (ref 30.0–36.0)
MCV: 96.1 fL (ref 80.0–100.0)
Platelets: 582 10*3/uL — ABNORMAL HIGH (ref 150–400)
RBC: 3.35 MIL/uL — ABNORMAL LOW (ref 3.87–5.11)
RDW: 14.5 % (ref 11.5–15.5)
WBC: 14.3 10*3/uL — ABNORMAL HIGH (ref 4.0–10.5)
nRBC: 0 % (ref 0.0–0.2)

## 2024-02-07 MED ORDER — LIDOCAINE HCL 1 % IJ SOLN
INTRAMUSCULAR | Status: AC
  Filled 2024-02-07: qty 20

## 2024-02-07 MED ORDER — POTASSIUM CHLORIDE CRYS ER 20 MEQ PO TBCR
20.0000 meq | EXTENDED_RELEASE_TABLET | Freq: Every day | ORAL | Status: DC
  Administered 2024-02-07: 20 meq via ORAL
  Filled 2024-02-07: qty 1

## 2024-02-07 MED ORDER — TORSEMIDE 20 MG PO TABS
20.0000 mg | ORAL_TABLET | Freq: Every day | ORAL | Status: DC
  Administered 2024-02-07: 20 mg via ORAL
  Filled 2024-02-07: qty 1

## 2024-02-07 MED ORDER — SULFAMETHOXAZOLE-TRIMETHOPRIM 800-160 MG PO TABS
1.0000 | ORAL_TABLET | Freq: Two times a day (BID) | ORAL | Status: AC
  Administered 2024-02-07 – 2024-02-09 (×6): 1 via ORAL
  Filled 2024-02-07 (×6): qty 1

## 2024-02-07 MED ORDER — LIDOCAINE HCL 1 % IJ SOLN
20.0000 mL | Freq: Once | INTRAMUSCULAR | Status: AC
  Filled 2024-02-07: qty 20

## 2024-02-07 NOTE — Progress Notes (Addendum)
 [address]       [place] [address]             [phone]      11 Days Post-Op Procedure(s) (LRB): CORONARY ARTERY BYPASS GRAFTING X 4, USING LEFT INTERNAL MAMMARY ARTERY AND RIGHT ENDOSCOPIC HARVESTED GREATER SAPHENOUS VEIN (N/A) ECHOCARDIOGRAM, TRANSESOPHAGEAL (N/A) Subjective: Patient continues to complain of right heel pain. Otherwise no new complaints  Objective: Vital signs in last 24 hours: Temp:  [97.5 F (36.4 C)-98.6 F (37 C)] 97.6 F (36.4 C) (05/20 0300) Pulse Rate:  [76-89] 78 (05/20 0300) Cardiac Rhythm: Normal sinus rhythm (05/19 1900) Resp:  [15-20] 17 (05/20 0300) BP: (100-145)/(47-80) 100/52 (05/20 0300) SpO2:  [92 %-98 %] 92 % (05/20 0300) Weight:  [58.3 kg] 58.3 kg (05/20 0300)  Hemodynamic parameters for last 24 hours:    Intake/Output from previous day: 05/19 0701 - 05/20 0700 In: 240 [P.O.:240] Out: 1300 [Urine:1300] Intake/Output this shift: No intake/output data recorded.  General appearance: alert, cooperative, and no distress Neurologic: intact Heart: regular rate and rhythm, S1, S2 normal, no murmur, click, rub or gallop Lungs: diminished bibasilar breath sounds Abdomen: soft, non-tender; bowel sounds normal; no masses,  no organomegaly Extremities: extremities normal, atraumatic, no cyanosis or edema Wound: Clean and dry without sign of infection  Lab Results: Recent Labs    02/06/24 0906 02/07/24 0438  WBC 15.9* 14.3*  HGB 10.6* 10.2*  HCT 33.1* 32.2*  PLT 507* 582*   BMET:  Recent Labs    02/06/24 0403 02/07/24 0438  NA 137 136  K 4.1 4.4  CL 96* 98  CO2 28 29  GLUCOSE 83 101*  BUN 26* 20  CREATININE 0.91 1.00  CALCIUM  8.9 9.3    PT/INR: No results for input(s): "LABPROT", "INR" in the last 72 hours. ABG    Component Value Date/Time   PHART 7.453 (H) 01/30/2024 1036   HCO3 23.9 01/30/2024 1036   TCO2 25 01/30/2024 1036   ACIDBASEDEF 4.0 (H) 01/29/2024 2050   O2SAT 70 02/04/2024 0448    CBG (last 3)  Recent Labs    02/04/24 2119 02/05/24 0627 02/05/24 1126  GLUCAP 88 76 108*    Assessment/Plan: S/P Procedure(s) (LRB): CORONARY ARTERY BYPASS GRAFTING X 4, USING LEFT INTERNAL MAMMARY ARTERY AND RIGHT ENDOSCOPIC HARVESTED GREATER SAPHENOUS VEIN (N/A) ECHOCARDIOGRAM, TRANSESOPHAGEAL (N/A)  CV: Postop cardiogenic shock, AHF signed off. SBP mostly 100-110s. NSR, HR 70s-80s. On Midodrine  10mg  TID. Hx of NSTEMI, on Plavix .   Pulm: Saturating well on 1L [place], increased oxygen requirements with ambulation. CXR with moderate left sided pleural effusion, small right pleural effusion and bibasilar atelectasis. Recommended thoracentesis but patient still is not agreeable at this time. States she will agree to get it done tomorrow morning. Will increase Torsemide  to 20mg  daily. Encourage IS, flutter valve and ambulation   GI: +BM, tolerating a diet   Endo: No hx of DM, SSI and CBGs have been d/c'd. Hypothyroid, on Levothyroxine    Renal: Cr 1.0, stable. UO 1300cc/24hrs. +3lbs from preop weight, +1lb from yesterday. On Torsemide  20mg  MWF per AHF, will increase to daily. K 4.1. UA yesterday with small leukocytes and rare bacteria, will give 3 days of Bactrim.    ID: Leukocytosis 14.3, stable. Afebrile. UA with small leukocytes and rare bacteria, will give 3 days of Bactrim.    DVT Prophylaxis: No lovenox , on Plavix  and ASA   Deconditioning: Needs to increase ambulation. PT recommending CIR but not a CIR  candidate due to lack of supervision after discharge. CSW following for SNF placement ([facility]) vs other inpatient rehab options.   Dispo: Work on improved ambulation and weaning oxygen, hopefully will be agreeable to thoracentesis later today.   Addendum: Per RN she is now agreeable to thoracentesis today   LOS: 15 days    Randa Burton, PA-C 02/07/2024   Chart reviewed, patient examined, agree with above.  Left thoracentesis today only removed 50 cc of fluid but resulted  in significant left ptx. IR planned to insert chest tube but pt refused. Followup CXR 4 hrs later was unchanged and she remains stable. Will do a followup CXR in am.

## 2024-02-07 NOTE — Procedures (Signed)
 Ultrasound-guided therapeutic left sided thoracentesis performed yielding 50 milliliter of serous colored fluid. No immediate complications.  Follow-up chest x-ray pending. EBL is < 2 ml.

## 2024-02-07 NOTE — Progress Notes (Signed)
 Physical Therapy Treatment Patient Details Name: Kaitlyn Wright MRN: [id number] DOB: 1946-04-29 Today's Date: 02/07/2024   History of Present Illness 78 year old woman  who presented with chest pain that she attributed to acid reflux on 5/5. +NSTEMI; LHC with 3v CAD; CABG 5/09; post-op cardiogenic shock and remained intubated until 5/12; Echo EF 60-65%  PMH- remote tobacco abuse, hypothyroidism, anxiety    PT Comments  RN contacted PT that pt wanted to walk prior to going down for thoracentesis. On arrival, pt had just walked with Mobility Specialist and asked to do LE exercises. See below for details. Continues to require min assist for sit to stand transfer (from recliner or BSC). Continues to require education on how to move and adhere to sternal precautions.     If plan is discharge home, recommend the following: Assistance with cooking/housework;Assist for transportation;A little help with walking and/or transfers;A little help with bathing/dressing/bathroom   Can travel by private vehicle        Equipment Recommendations  Rolling walker (2 wheels)    Recommendations for Other Services       Precautions / Restrictions Precautions Precautions: Sternal Precaution Booklet Issued: No Recall of Precautions/Restrictions: Impaired Precaution/Restrictions Comments: able to verbalize precautions, however, needed cues for adherance during transfers     Mobility  Bed Mobility Overal bed mobility: Needs Assistance Bed Mobility: Sit to Sidelying, Rolling Rolling: Contact guard assist       Sit to sidelying: Min assist General bed mobility comments: educated on sit to side and then roll onto back while maintaining sternal precautions    Transfers Overall transfer level: Modified independent Equipment used: Rolling walker (2 wheels) Transfers: Sit to/from Stand, Bed to chair/wheelchair/BSC Sit to Stand: Min assist   Step pivot transfers: Min assist       General transfer  comment: assist to scoot forward to EOC; cues and assist for forward wt-shift and come to stand (pt using momentum); repeated x4    Ambulation/Gait               General Gait Details: pt deferred as just walked with Mobility Specialist   Stairs             Wheelchair Mobility     Tilt Bed    Modified Rankin (Stroke Patients Only)       Balance Overall balance assessment: Needs assistance Sitting-balance support: Feet supported Sitting balance-Leahy Scale: Fair     Standing balance support: Bilateral upper extremity supported Standing balance-Leahy Scale: Poor Standing balance comment: reliant on external support                            Communication Communication Communication: No apparent difficulties  Cognition Arousal: Alert Behavior During Therapy: WFL for tasks assessed/performed   PT - Cognitive impairments: No apparent impairments                         Following commands: Intact      Cueing Cueing Techniques: Verbal cues, Gestural cues, Tactile cues  Exercises General Exercises - Lower Extremity Long Arc Quad: AROM, Both, 20 reps, Seated Toe Raises: AROM, Both, 20 reps Heel Raises: AROM, Both, 20 reps Other Exercises Other Exercises: sit to stand x 4    General Comments        Pertinent Vitals/Pain Pain Assessment Pain Assessment: Faces Faces Pain Scale: Hurts a little bit Pain Location: rt heel Pain Descriptors / Indicators: Discomfort,  Sore Pain Intervention(s): Limited activity within patient's tolerance, Monitored during session    Home Living                          Prior Function            PT Goals (current goals can now be found in the care plan section) Acute Rehab PT Goals Patient Stated Goal: return home PT Goal Formulation: With patient Time For Goal Achievement: 02/14/24 Potential to Achieve Goals: Good Progress towards PT goals: Progressing toward goals     Frequency    Min 2X/week      PT Plan      Co-evaluation              AM-PAC PT "6 Clicks" Mobility   Outcome Measure  Help needed turning from your back to your side while in a flat bed without using bedrails?: A Little Help needed moving from lying on your back to sitting on the side of a flat bed without using bedrails?: A Lot Help needed moving to and from a bed to a chair (including a wheelchair)?: A Little Help needed standing up from a chair using your arms (e.g., wheelchair or bedside chair)?: A Little Help needed to walk in hospital room?: A Little Help needed climbing 3-5 steps with a railing? : Total 6 Click Score: 15    End of Session Equipment Utilized During Treatment: Oxygen Activity Tolerance: Patient tolerated treatment well Patient left: with call bell/phone within reach;in bed   PT Visit Diagnosis: Muscle weakness (generalized) (M62.81);Difficulty in walking, not elsewhere classified (R26.2)     Time: 1610-9604 PT Time Calculation (min) (ACUTE ONLY): 34 min  Charges:    $Therapeutic Exercise: 8-22 mins $Therapeutic Activity: 8-22 mins PT General Charges $$ ACUTE PT VISIT: 1 Visit                      Gayle Kava, PT Acute Rehabilitation Services  Office [phone]    [name] 02/07/2024, 10:42 AM

## 2024-02-07 NOTE — Progress Notes (Addendum)
 IR progress note  Patient underwent left thoracentesis earlier today. Post procedure chest xray showed evidence of a left pneumothorax with 36% left hemithoracic volume loss. After review by Dr. Sylvester Evert, left chest tube placement recommended.  After discussion with the patient about the procedure and risks, patient refused chest tube placement. Patient informed about the risk of worsening pneumothorax/complications without a chest tube placement. Patient currently denies pain or increased shortness of breath.   At this time, Dr. Sylvester Evert recommends continued monitoring of the patient. Also, a repeat chest xray now and in the morning. Xrays ordered. Patient informed to let staff know if she becomes symptomatic with pain or shortness of breath.   Please contact IR with questions or concerns.

## 2024-02-07 NOTE — Plan of Care (Signed)
  Problem: Education: Goal: Understanding of CV disease, CV risk reduction, and recovery process will improve Outcome: Progressing   Problem: Activity: Goal: Ability to return to baseline activity level will improve Outcome: Not Progressing pt feeling tired and depressed wanting to rest and not ambulate.

## 2024-02-07 NOTE — Progress Notes (Signed)
 Mobility Specialist Progress Note:    02/07/24 0908  Mobility  Activity Ambulated with assistance to bathroom;Ambulated with assistance in room;Ambulated with assistance in hallway;Transferred from bed to chair  Level of Assistance Minimal assist, patient does 75% or more  Assistive Device Front wheel walker  Distance Ambulated (ft) 200 ft  RUE Weight Bearing Per Provider Order NWB  LUE Weight Bearing Per Provider Order NWB  Activity Response Tolerated well  Mobility Referral Yes  Mobility visit 1 Mobility  Mobility Specialist Start Time (ACUTE ONLY) 0908  Mobility Specialist Stop Time (ACUTE ONLY) [id number]  Mobility Specialist Time Calculation (min) (ACUTE ONLY) 19 min    Pt requesting mobility session. MinA to sit EOB and stand at bedside with RW. CGA during ambulation, VSS throughout. Tolerated well, returned pt to room. Encouraged pt to use bathroom before sitting up in chair. Left pt in chair with all needs met, call bell in reach.    Dynver Clemson Mobility Specialist Please contact via Special educational needs teacher or  Rehab office at [phone]

## 2024-02-07 NOTE — Progress Notes (Signed)
 CARDIAC REHAB PHASE I   Pt ambulated with mobility team this am. Working with PT, when I went to stopped by to see. Plan for thoracentesis today. Will follow up for mobility needs and education at a later time.   Ronny Colas, RN BSN 02/07/2024 10:56 AM

## 2024-02-07 NOTE — TOC Progression Note (Signed)
 Transition of Care [facility]) - Progression Note    Patient Details  Name: Osha Rane MRN: [id number] Date of Birth: 08-13-1946  [facility]) CM/SW Contact  Murphy Arn, LCSWA Phone Number: 02/07/2024, 1:30 PM  Clinical Narrative:   HF CSW spoke with patients daughter to share updates. CSW informed that patients daughter that per the PA she may be medically ready for dc in the next 48hrs.  Patients daughter stated that [facility] and [facility] were not an option. Patients daughter stated that her first choice is [place] and then [place].   CSW followed up with Star at [facility] who stated that they still have limited bed availability.   TOC will continue following.     Expected Discharge Plan: Skilled Nursing Facility Barriers to Discharge: Continued Medical Work up  Expected Discharge Plan and Services   Discharge Planning Services: CM Consult Post Acute Care Choice: Skilled Nursing Facility Living arrangements for the past 2 months: Apartment                                       Social Determinants of Health (SDOH) Interventions SDOH Screenings   Food Insecurity: No Food Insecurity (02/03/2024)  Housing: Low Risk  (01/25/2024)  Transportation Needs: No Transportation Needs (01/25/2024)  Utilities: Not At Risk (01/25/2024)  Social Connections: Moderately Integrated (02/05/2024)  Tobacco Use: Low Risk  (01/27/2024)    Readmission Risk Interventions     No data to display

## 2024-02-08 ENCOUNTER — Inpatient Hospital Stay (HOSPITAL_COMMUNITY)

## 2024-02-08 MED ORDER — CALCIUM CARBONATE ANTACID 500 MG PO CHEW
1.0000 | CHEWABLE_TABLET | ORAL | Status: DC | PRN
  Administered 2024-02-10 – 2024-02-13 (×2): 200 mg via ORAL
  Filled 2024-02-08 (×3): qty 1

## 2024-02-08 MED ORDER — TORSEMIDE 20 MG PO TABS
20.0000 mg | ORAL_TABLET | ORAL | Status: DC
  Administered 2024-02-08 – 2024-02-10 (×2): 20 mg via ORAL
  Filled 2024-02-08 (×3): qty 1

## 2024-02-08 MED ORDER — POTASSIUM CHLORIDE CRYS ER 20 MEQ PO TBCR
20.0000 meq | EXTENDED_RELEASE_TABLET | ORAL | Status: DC
  Administered 2024-02-08 – 2024-02-10 (×2): 20 meq via ORAL
  Filled 2024-02-08: qty 2
  Filled 2024-02-08: qty 1

## 2024-02-08 MED ORDER — LOPERAMIDE HCL 1 MG/7.5ML PO SUSP: 2.0000 mg | ORAL | Status: DC | PRN

## 2024-02-08 MED ORDER — LACTULOSE 10 GM/15ML PO SOLN: 20.0000 g | Freq: Every day | ORAL | Status: DC | PRN

## 2024-02-08 MED ORDER — GUAIFENESIN ER 600 MG PO TB12
600.0000 mg | ORAL_TABLET | Freq: Two times a day (BID) | ORAL | Status: DC
  Administered 2024-02-08 – 2024-02-13 (×11): 600 mg via ORAL
  Filled 2024-02-08 (×11): qty 1

## 2024-02-08 NOTE — Progress Notes (Signed)
 Interventional Radiology Brief Note:  CXR reviewed this AM-- appears stable to minimally improved, but still with significant pneumothorax. TCTS has met with patient who is hopeful PTX will continue to improve.  Remains on 2L [place].   IR remains available if patient decompensates or becomes agreeable to further intervention.  Please re-consult if we can be of help to this patient.   Taleisha Kaczynski, MS RD PA-C

## 2024-02-08 NOTE — Progress Notes (Addendum)
 [address]       [place] [address]             [phone]      12 Days Post-Op Procedure(s) (LRB): CORONARY ARTERY BYPASS GRAFTING X 4, USING LEFT INTERNAL MAMMARY ARTERY AND RIGHT ENDOSCOPIC HARVESTED GREATER SAPHENOUS VEIN (N/A) ECHOCARDIOGRAM, TRANSESOPHAGEAL (N/A) Subjective: Patient lying in bed, denies pain and shortness of breath this AM. She does admit to a little coughing overnight but no sputum production.   Objective: Vital signs in last 24 hours: Temp:  [97.7 F (36.5 C)-98.2 F (36.8 C)] 97.7 F (36.5 C) (05/21 0306) Pulse Rate:  [77-89] 82 (05/21 0306) Cardiac Rhythm: Normal sinus rhythm (05/20 1900) Resp:  [13-26] 20 (05/21 0306) BP: (101-138)/(48-61) 101/48 (05/21 0306) SpO2:  [91 %-94 %] 92 % (05/21 0306) Weight:  [56.9 kg] 56.9 kg (05/21 0336)  Hemodynamic parameters for last 24 hours:    Intake/Output from previous day: 05/20 0701 - 05/21 0700 In: 240 [P.O.:240] Out: 350 [Urine:350] Intake/Output this shift: No intake/output data recorded.  General appearance: alert, cooperative, and no distress Neurologic: intact Heart: regular rate and rhythm Lungs: diminished bibasilar breath sounds and slightly diminished left apical breath sounds Abdomen: soft, non-tender; bowel sounds normal; no masses,  no organomegaly Extremities: extremities normal, atraumatic, no cyanosis or edema Wound: Clean and dry without sign of infection. Ecchymosis around the incision.   Lab Results: Recent Labs    02/06/24 0906 02/07/24 0438  WBC 15.9* 14.3*  HGB 10.6* 10.2*  HCT 33.1* 32.2*  PLT 507* 582*   BMET:  Recent Labs    02/06/24 0403 02/07/24 0438  NA 137 136  K 4.1 4.4  CL 96* 98  CO2 28 29  GLUCOSE 83 101*  BUN 26* 20  CREATININE 0.91 1.00  CALCIUM  8.9 9.3    PT/INR: No results for input(s): "LABPROT", "INR" in the last 72 hours. ABG    Component Value Date/Time   PHART 7.453 (H) 01/30/2024 1036   HCO3 23.9 01/30/2024  1036   TCO2 25 01/30/2024 1036   ACIDBASEDEF 4.0 (H) 01/29/2024 2050   O2SAT 70 02/04/2024 0448   CBG (last 3)  Recent Labs    02/05/24 1126  GLUCAP 108*    Assessment/Plan: S/P Procedure(s) (LRB): CORONARY ARTERY BYPASS GRAFTING X 4, USING LEFT INTERNAL MAMMARY ARTERY AND RIGHT ENDOSCOPIC HARVESTED GREATER SAPHENOUS VEIN (N/A) ECHOCARDIOGRAM, TRANSESOPHAGEAL (N/A)  CV: Postop cardiogenic shock, AHF signed off. SBP mostly 100-110s. NSR, HR 70s-80s. On Midodrine  10mg  TID. Hx of NSTEMI, on Plavix .   Pulm: Saturating well on 2L [facility]. Sent patient for left thoracentesis yesterday, only yielded 50cc of serous fluid and patient developed a post thoracentesis pneumothorax. She refused chest tube placement yesterday. CXR this AM with improved left pneumothorax and bibasilar atelectasis. Hopefully pneumothorax will continue to improve. Encourage IS, flutter valve, and ambulation. Wean O2 as tolerated for O2>90%.  GI: +BM, tolerating a diet   Endo: No hx of DM, SSI and CBGs have been d/c'd. Hypothyroid, on Levothyroxine    Renal: Cr 1.0, stable. UO 350cc/24hrs recorded. At preop weight. Will change Torsemide  back to M,W,F. K 4.4. UA with small leukocytes and rare bacteria, on Bactrim.    ID: Leukocytosis 14.3, stable. Afebrile. UA with small leukocytes and rare bacteria, On day 2/3 of Bactrim.    DVT Prophylaxis: No lovenox , on Plavix  and ASA   Deconditioning: Continue work with PT/OT. Not a rehab candidate due to no home supervision. CSW  following for SNF placement at [facility].   Dispo: Dispo planning, hopefully if pneumothorax continues to resolve and can wean off oxygen can d/c next 2-3 days.     LOS: 16 days   Randa Burton, PA-C 02/08/2024   Chart reviewed, patient examined, agree with above.  She says she feels fine and denies any SOB. CXR this am looks better with small residual left px. Continue IS, ambulation, wean oxygen. Will repeat CXR in am.

## 2024-02-08 NOTE — TOC Progression Note (Signed)
 Transition of Care [facility]) - Progression Note    Patient Details  Name: Kaitlyn Wright MRN: [id number] Date of Birth: Feb 26, 1946  [facility]) CM/SW Contact  Ernst Heap Phone Number: [phone] 02/08/2024, 9:53 AM  Clinical Narrative:   HF CSW spoke with Bertell Broach at [facility] who confirmed at this time they have bed availability and should have beds within the next 48 hrs. Bertell Broach will be visiting patient at bedside today.   CSW spoke with patients daughter about [facility] still having limited bed availability and [facility] having bed availability. CSW inquired with patients daughter about start insurance auth. She will updated CSW after meeting with the patient at lunch today.   TOC will continue following.   Expected Discharge Plan: Skilled Nursing Facility Barriers to Discharge: Continued Medical Work up  Expected Discharge Plan and Services   Discharge Planning Services: CM Consult Post Acute Care Choice: Skilled Nursing Facility Living arrangements for the past 2 months: Apartment                                       Social Determinants of Health (SDOH) Interventions SDOH Screenings   Food Insecurity: No Food Insecurity (02/03/2024)  Housing: Low Risk  (01/25/2024)  Transportation Needs: No Transportation Needs (01/25/2024)  Utilities: Not At Risk (01/25/2024)  Social Connections: Moderately Integrated (02/05/2024)  Tobacco Use: Low Risk  (01/27/2024)    Readmission Risk Interventions     No data to display

## 2024-02-08 NOTE — Progress Notes (Signed)
 Physical Therapy Treatment Patient Details Name: Kaitlyn Wright MRN: [id number] DOB: [birth year] Today's Date: 02/08/2024   History of Present Illness 78 year old woman  who presented with chest pain that she attributed to acid reflux on 5/5. +NSTEMI; LHC with 3v CAD; CABG 5/09; post-op cardiogenic shock and remained intubated until 5/12; Echo EF 60-65%  PMH- remote tobacco abuse, hypothyroidism, anxiety    PT Comments  On arrival, pt using flutter valve. Educated in best technique. Patient reports she got very little sleep last night and is too exhausted to get OOB. She is eager to do LE exercises as she reports they really help her legs and low back feel better. See below for exercise details. Patient promises she will get OOB and walk 5/22.     If plan is discharge home, recommend the following: Assistance with cooking/housework;Assist for transportation;A little help with walking and/or transfers;A little help with bathing/dressing/bathroom   Can travel by private vehicle        Equipment Recommendations  Rolling walker (2 wheels)    Recommendations for Other Services       Precautions / Restrictions Precautions Precautions: Sternal Precaution Booklet Issued: No Recall of Precautions/Restrictions: Impaired Precaution/Restrictions Comments: able to verbalize precautions, however, needed cues for adherance during transfers Restrictions Other Position/Activity Restrictions: sternal precautions     Mobility  Bed Mobility Overal bed mobility: Needs Assistance Bed Mobility: Rolling Rolling: Mod assist (R and lt)         General bed mobility comments: rolling for sidelying exercise    Transfers                        Ambulation/Gait                   Stairs             Wheelchair Mobility     Tilt Bed    Modified Rankin (Stroke Patients Only)       Balance                                            Communication  Communication Communication: No apparent difficulties  Cognition Arousal: Alert Behavior During Therapy: WFL for tasks assessed/performed   PT - Cognitive impairments: No apparent impairments                         Following commands: Intact      Cueing Cueing Techniques: Verbal cues, Gestural cues, Tactile cues  Exercises General Exercises - Lower Extremity Ankle Circles/Pumps: AROM, 15 reps Hip ABduction/ADduction: AROM, Both, 10 reps, Sidelying Straight Leg Raises: AROM, Both, 10 reps Hip Flexion/Marching: AROM, 20 reps, Supine Other Exercises Other Exercises: mini-bridging x 10, hands across chest    General Comments        Pertinent Vitals/Pain Pain Assessment Pain Assessment: Faces Faces Pain Scale: Hurts even more Pain Location: chest with rolling Pain Descriptors / Indicators: Discomfort, Sore Pain Intervention(s): Limited activity within patient's tolerance, Monitored during session    Home Living                          Prior Function            PT Goals (current goals can now be found in the care plan section) Acute Rehab PT  Goals Patient Stated Goal: return home Time For Goal Achievement: 02/14/24 Potential to Achieve Goals: Good Progress towards PT goals: Not progressing toward goals - comment (continues to refuse mobility)    Frequency    Min 2X/week      PT Plan      Co-evaluation              AM-PAC PT "6 Clicks" Mobility   Outcome Measure  Help needed turning from your back to your side while in a flat bed without using bedrails?: A Lot Help needed moving from lying on your back to sitting on the side of a flat bed without using bedrails?: A Lot Help needed moving to and from a bed to a chair (including a wheelchair)?: A Little Help needed standing up from a chair using your arms (e.g., wheelchair or bedside chair)?: A Little Help needed to walk in hospital room?: A Little Help needed climbing 3-5 steps  with a railing? : Total 6 Click Score: 14    End of Session Equipment Utilized During Treatment: Oxygen Activity Tolerance: Patient tolerated treatment well Patient left: with call bell/phone within reach;in bed   PT Visit Diagnosis: Muscle weakness (generalized) (M62.81);Difficulty in walking, not elsewhere classified (R26.2)     Time: 1027-2536 PT Time Calculation (min) (ACUTE ONLY): 29 min  Charges:    $Therapeutic Exercise: 23-37 mins PT General Charges $$ ACUTE PT VISIT: 1 Visit                      Gayle Kava, PT Acute Rehabilitation Services  Office [phone]    [name] 02/08/2024, 11:45 AM

## 2024-02-08 NOTE — Progress Notes (Signed)
 Patient daughter, requesting that patient phone and glasses remain on pt at all times, requesting that they stay in the bed with her "states so she can reach them". Bedside RN aware, Patient currently talking on phone at this time.  Kaitlyn Wright

## 2024-02-08 NOTE — Care Management Important Message (Signed)
 Important Message  Patient Details  Name: Kaitlyn Wright MRN: [id number] Date of Birth: May 13, 1946   Important Message Given:  Yes - Medicare IM     Felix Host 02/08/2024, 1:58 PM

## 2024-02-09 ENCOUNTER — Inpatient Hospital Stay (HOSPITAL_COMMUNITY)

## 2024-02-09 NOTE — Progress Notes (Addendum)
 [address]       [place] [address]             [phone]      13 Days Post-Op Procedure(s) (LRB): CORONARY ARTERY BYPASS GRAFTING X 4, USING LEFT INTERNAL MAMMARY ARTERY AND RIGHT ENDOSCOPIC HARVESTED GREATER SAPHENOUS VEIN (N/A) ECHOCARDIOGRAM, TRANSESOPHAGEAL (N/A) Subjective: The patient states she is "nervous because everyone keeps waking her up."   Objective: Vital signs in last 24 hours: Temp:  [97.4 F (36.3 C)-98.2 F (36.8 C)] 98 F (36.7 C) (05/22 0401) Pulse Rate:  [73-80] 79 (05/22 0401) Cardiac Rhythm: Normal sinus rhythm (05/21 1900) Resp:  [17-22] 17 (05/22 0401) BP: (99-119)/(53-92) 119/62 (05/22 0401) SpO2:  [90 %-100 %] 95 % (05/22 0401) Weight:  [56 kg-57.6 kg] 56 kg (05/22 0401)  Hemodynamic parameters for last 24 hours:    Intake/Output from previous day: 05/21 0701 - 05/22 0700 In: -  Out: 650 [Urine:650] Intake/Output this shift: No intake/output data recorded.  General appearance: alert, cooperative, and no distress Neurologic: intact Heart: regular rate and rhythm, S1, S2 normal, no murmur, click, rub or gallop Lungs: diminished bibasilar breath sounds, diminished left apical breath sounds Abdomen: soft, non-tender; bowel sounds normal; no masses,  no organomegaly Extremities: extremities normal, atraumatic, no cyanosis or edema Wound: Clean and dry without sign of infection  Lab Results: Recent Labs    02/06/24 0906 02/07/24 0438  WBC 15.9* 14.3*  HGB 10.6* 10.2*  HCT 33.1* 32.2*  PLT 507* 582*   BMET:  Recent Labs    02/07/24 0438  NA 136  K 4.4  CL 98  CO2 29  GLUCOSE 101*  BUN 20  CREATININE 1.00  CALCIUM  9.3    PT/INR: No results for input(s): "LABPROT", "INR" in the last 72 hours. ABG    Component Value Date/Time   PHART 7.453 (H) 01/30/2024 1036   HCO3 23.9 01/30/2024 1036   TCO2 25 01/30/2024 1036   ACIDBASEDEF 4.0 (H) 01/29/2024 2050   O2SAT 70 02/04/2024 0448   CBG (last 3)   No results for input(s): "GLUCAP" in the last 72 hours.  Assessment/Plan: S/P Procedure(s) (LRB): CORONARY ARTERY BYPASS GRAFTING X 4, USING LEFT INTERNAL MAMMARY ARTERY AND RIGHT ENDOSCOPIC HARVESTED GREATER SAPHENOUS VEIN (N/A) ECHOCARDIOGRAM, TRANSESOPHAGEAL (N/A)  CV: Postop cardiogenic shock, AHF signed off. SBP mostly 110s. NSR, HR 70s-80s. On Midodrine  10mg  TID. Hx of NSTEMI, on Plavix .   Pulm: Saturating well on 2-3L [place]. Left thoracentesis 05/20, only yielded 50cc of serous fluid and patient developed a post thoracentesis pneumothorax. She refused chest tube placement. CXR with stable left apical pneumothorax and bibasilar atelectasis. Hopefully pneumothorax will continue to improve. Patient has poor deep breathing, we reviewed proper use of IS and flutter valve. Encourage IS, flutter valve, and ambulation. Wean O2 as tolerated for O2>90%.   GI: +BM, tolerating a diet   Endo: No hx of DM, SSI and CBGs have been d/c'd. Hypothyroid, on Levothyroxine    Renal: Last Cr 1.0, stable. UO 650cc/24hrs recorded. Under preop weight. Will continue Torsemide  M,W,F. Last K 4.4. UA with small leukocytes and rare bacteria, on Bactrim.    ID: Leukocytosis 14.3, stable. Afebrile. UA with small leukocytes and rare bacteria, on day 3/3 of Bactrim.    DVT Prophylaxis: No lovenox , on Plavix  and ASA   Deconditioning: Continue work with PT/OT. Patient refused walks yesterday, discussed goal is at least 2 walks today. Not a rehab candidate due to no home supervision.  CSW following for SNF placement.   Dispo: Dispo planning, still unable to wean oxygen due to atelectasis and left pneumothorax. May not be able to discharge until early next week.    LOS: 17 days    Randa Burton, PA-C 02/09/2024   Chart reviewed, patient examined, agree with above.  CXR looks stable with small left ptx, small left effusion and basilar atelectasis. She is still on oxygen. Ambulated this afternoon with PT. Encouraged to  use flutter and IS and she says she is.

## 2024-02-09 NOTE — Progress Notes (Signed)
 Physical Therapy Treatment Patient Details Name: Kaitlyn Wright MRN: [id number] DOB: Nov 27, 1945 Today's Date: 02/09/2024   History of Present Illness 78 year old woman  who presented with chest pain that she attributed to acid reflux on 5/5. +NSTEMI; LHC with 3v CAD; CABG 5/09; post-op cardiogenic shock and remained intubated until 5/12; Echo EF 60-65%  PMH- remote tobacco abuse, hypothyroidism, anxiety    PT Comments  Patient completing OT session on arrival. On [facility] and agreed to ambulate with chair follow. Transfers with light min assist and ambulates with rW x70 ft with min assist (especially for turning RW). Remained on 2L O2. Continues to require min-mod cues to adhere to sternal precautions.    If plan is discharge home, recommend the following: Assistance with cooking/housework;Assist for transportation;A little help with walking and/or transfers;A little help with bathing/dressing/bathroom   Can travel by private vehicle     Yes  Equipment Recommendations  Rolling walker (2 wheels)    Recommendations for Other Services       Precautions / Restrictions Precautions Precautions: Fall;Sternal Precaution Booklet Issued: No Recall of Precautions/Restrictions: Impaired Precaution/Restrictions Comments: able to verbalize precautions, however, needed cues for adherance during transfers Restrictions Weight Bearing Restrictions Per Provider Order: No     Mobility  Bed Mobility                    Transfers Overall transfer level: Needs assistance Equipment used: Rolling walker (2 wheels) Transfers: Sit to/from Stand Sit to Stand: Min assist           General transfer comment: cues for hand placement and technique, assist to rise and steady    Ambulation/Gait Ambulation/Gait assistance: Min assist, +2 safety/equipment Gait Distance (Feet): 70 Feet Assistive device: Rolling walker (2 wheels) Gait Pattern/deviations: Step-through pattern, Decreased stride length,  Drifts right/left, Trunk flexed Gait velocity: dec     General Gait Details: very anxious and only would walk if chair follow provided; tends to push RW too far ahead and needs assist to maneuver RW during turns   Stairs             Wheelchair Mobility     Tilt Bed    Modified Rankin (Stroke Patients Only)       Balance Overall balance assessment: Needs assistance Sitting-balance support: Feet supported Sitting balance-Leahy Scale: Fair     Standing balance support: Bilateral upper extremity supported Standing balance-Leahy Scale: Poor Standing balance comment: reliant on external support                            Communication Communication Communication: No apparent difficulties  Cognition Arousal: Alert Behavior During Therapy: Anxious                             Following commands: Intact      Cueing Cueing Techniques: Verbal cues  Exercises      General Comments General comments (skin integrity, edema, etc.): On 2L [place] O2      Pertinent Vitals/Pain Pain Assessment Pain Assessment: Faces Faces Pain Scale: Hurts little more Pain Location: chest Pain Descriptors / Indicators: Tightness Pain Intervention(s): Limited activity within patient's tolerance, Monitored during session    Home Living                          Prior Function  PT Goals (current goals can now be found in the care plan section) Acute Rehab PT Goals Patient Stated Goal: return home PT Goal Formulation: With patient Time For Goal Achievement: 02/14/24 Potential to Achieve Goals: Good Progress towards PT goals: Progressing toward goals    Frequency    Min 2X/week      PT Plan      Co-evaluation   Reason for Co-Treatment: For patient/therapist safety          AM-PAC PT "6 Clicks" Mobility   Outcome Measure  Help needed turning from your back to your side while in a flat bed without using bedrails?: A Lot Help  needed moving from lying on your back to sitting on the side of a flat bed without using bedrails?: A Lot Help needed moving to and from a bed to a chair (including a wheelchair)?: A Little Help needed standing up from a chair using your arms (e.g., wheelchair or bedside chair)?: A Little Help needed to walk in hospital room?: A Little Help needed climbing 3-5 steps with a railing? : Total 6 Click Score: 14    End of Session Equipment Utilized During Treatment: Oxygen Activity Tolerance: Patient tolerated treatment well Patient left: with call bell/phone within reach;in chair   PT Visit Diagnosis: Muscle weakness (generalized) (M62.81);Difficulty in walking, not elsewhere classified (R26.2)     Time: 3244-0102 PT Time Calculation (min) (ACUTE ONLY): 16 min  Charges:    $Gait Training: 8-22 mins PT General Charges $$ ACUTE PT VISIT: 1 Visit                      Gayle Kava, PT Acute Rehabilitation Services  Office [phone]    [name] 02/09/2024, 2:10 PM

## 2024-02-09 NOTE — Progress Notes (Signed)
 Occupational Therapy Treatment Patient Details Name: Helaine Yackel MRN: [id number] DOB: 21-Jun-1946 Today's Date: 02/09/2024   History of present illness 78 year old woman  who presented with chest pain that she attributed to acid reflux on 5/5. +NSTEMI; LHC with 3v CAD; CABG 5/09; post-op cardiogenic shock and remained intubated until 5/12; Echo EF 60-65%  PMH- remote tobacco abuse, hypothyroidism, anxiety   OT comments  Pt with anxiety, weakness and decreased activity tolerance, but willing to work with therapies. Up to mod assist for bed mobility, light min assist for sit to stand and up to min assist for ambulation with RW. Pt needs repeated cues for sternal precautions. Agreed to stay up in chair at end of session with all needs met. Updated discharge plan. Patient will benefit from continued inpatient follow up therapy, <3 hours/day.      If plan is discharge home, recommend the following:  A little help with walking and/or transfers;A lot of help with bathing/dressing/bathroom;Assistance with cooking/housework;Assist for transportation;Help with stairs or ramp for entrance   Equipment Recommendations  Other (comment) (defer)    Recommendations for Other Services      Precautions / Restrictions Precautions Precautions: Fall;Sternal Precaution Booklet Issued: No Recall of Precautions/Restrictions: Impaired Restrictions Weight Bearing Restrictions Per Provider Order: No       Mobility Bed Mobility Overal bed mobility: Needs Assistance Bed Mobility: Rolling, Supine to Sit Rolling: Mod assist       Sit to sidelying: Min assist General bed mobility comments: cues for technique    Transfers Overall transfer level: Needs assistance Equipment used: Rolling walker (2 wheels) Transfers: Sit to/from Stand Sit to Stand: Min assist           General transfer comment: cues for hand placement and technique, assist to rise and steady     Balance Overall balance assessment:  Needs assistance   Sitting balance-Leahy Scale: Fair       Standing balance-Leahy Scale: Poor                             ADL either performed or assessed with clinical judgement   ADL Overall ADL's : Needs assistance/impaired                     Lower Body Dressing: Total assistance;Sit to/from stand   Toilet Transfer: Minimal assistance;Stand-pivot;BSC/3in1;Rolling walker (2 wheels)   Toileting- Clothing Manipulation and Hygiene: Total assistance;Sit to/from stand       Functional mobility during ADLs: Minimal assistance;+2 for safety/equipment;Rolling walker (2 wheels)      Extremity/Trunk Assessment              Vision       Perception     Praxis     Communication Communication Communication: No apparent difficulties   Cognition Arousal: Alert Behavior During Therapy: Anxious Cognition: Cognition impaired       Memory impairment (select all impairments): Short-term memory     OT - Cognition Comments: cues for sternal precautions                 Following commands: Intact        Cueing   Cueing Techniques: Verbal cues  Exercises      Shoulder Instructions       General Comments      Pertinent Vitals/ Pain       Pain Assessment Pain Assessment: Faces Faces Pain Scale: Hurts little more Pain Location: chest Pain  Descriptors / Indicators: Tightness Pain Intervention(s): Monitored during session, Repositioned, Premedicated before session  Home Living                                          Prior Functioning/Environment              Frequency  Min 2X/week        Progress Toward Goals  OT Goals(current goals can now be found in the care plan section)  Progress towards OT goals: Progressing toward goals  Acute Rehab OT Goals OT Goal Formulation: With patient Time For Goal Achievement: 02/14/24 Potential to Achieve Goals: Good  Plan      Co-evaluation    PT/OT/SLP  Co-Evaluation/Treatment: Yes Reason for Co-Treatment: For patient/therapist safety          AM-PAC OT "6 Clicks" Daily Activity     Outcome Measure   Help from another person eating meals?: None Help from another person taking care of personal grooming?: A Little Help from another person toileting, which includes using toliet, bedpan, or urinal?: Total Help from another person bathing (including washing, rinsing, drying)?: A Lot Help from another person to put on and taking off regular upper body clothing?: A Lot Help from another person to put on and taking off regular lower body clothing?: Total 6 Click Score: 13    End of Session Equipment Utilized During Treatment: Rolling walker (2 wheels);Gait belt;Oxygen  OT Visit Diagnosis: Unsteadiness on feet (R26.81);Muscle weakness (generalized) (M62.81);Other symptoms and signs involving cognitive function   Activity Tolerance Patient tolerated treatment well   Patient Left in chair;with call bell/phone within reach   Nurse Communication          Time: 1610-9604 OT Time Calculation (min): 40 min  Charges: OT General Charges $OT Visit: 1 Visit OT Treatments $Self Care/Home Management : 23-37 mins  Avanell Leigh, OTR/L Acute Rehabilitation Services Office: [phone]   Jonette Nestle 02/09/2024, 2:05 PM

## 2024-02-09 NOTE — TOC Progression Note (Signed)
 Transition of Care [facility]) - Progression Note    Patient Details  Name: Kaitlyn Wright MRN: [id number] Date of Birth: Aug 03, 1946  [facility] CM/SW Contact  Ernst Heap Phone Number: [phone] 02/09/2024, 11:52 AM  Clinical Narrative:  [facility] CSW spoke with the patients daughter who stated that they would like to choose [facility] as their SNF option. CSW will start insurance auth when patient is closer to being medically ready for dc.   TOC will continue following.      Expected Discharge Plan: Skilled Nursing Facility Barriers to Discharge: Continued Medical Work up  Expected Discharge Plan and Services   Discharge Planning Services: CM Consult Post Acute Care Choice: Skilled Nursing Facility Living arrangements for the past 2 months: Apartment                                       Social Determinants of Health (SDOH) Interventions SDOH Screenings   Food Insecurity: No Food Insecurity (02/03/2024)  Housing: Low Risk  (01/25/2024)  Transportation Needs: No Transportation Needs (01/25/2024)  Utilities: Not At Risk (01/25/2024)  Social Connections: Moderately Integrated (02/05/2024)  Tobacco Use: Low Risk  (01/27/2024)    Readmission Risk Interventions     No data to display

## 2024-02-10 ENCOUNTER — Inpatient Hospital Stay (HOSPITAL_COMMUNITY)

## 2024-02-10 LAB — CBC
HCT: 34.1 % — ABNORMAL LOW (ref 36.0–46.0)
Hemoglobin: 11.2 g/dL — ABNORMAL LOW (ref 12.0–15.0)
MCH: 31.5 pg (ref 26.0–34.0)
MCHC: 32.8 g/dL (ref 30.0–36.0)
MCV: 95.8 fL (ref 80.0–100.0)
Platelets: 643 10*3/uL — ABNORMAL HIGH (ref 150–400)
RBC: 3.56 MIL/uL — ABNORMAL LOW (ref 3.87–5.11)
RDW: 14.3 % (ref 11.5–15.5)
WBC: 18.7 10*3/uL — ABNORMAL HIGH (ref 4.0–10.5)
nRBC: 0 % (ref 0.0–0.2)

## 2024-02-10 MED ORDER — ENOXAPARIN SODIUM 30 MG/0.3ML IJ SOSY
30.0000 mg | PREFILLED_SYRINGE | INTRAMUSCULAR | Status: DC
Start: 2024-02-10 — End: 2024-02-15
  Administered 2024-02-10 – 2024-02-15 (×6): 30 mg via SUBCUTANEOUS
  Filled 2024-02-10 (×6): qty 0.3

## 2024-02-10 MED ORDER — METOPROLOL TARTRATE 12.5 MG HALF TABLET
12.5000 mg | ORAL_TABLET | Freq: Two times a day (BID) | ORAL | Status: DC
  Administered 2024-02-10 – 2024-02-15 (×11): 12.5 mg via ORAL
  Filled 2024-02-10 (×11): qty 1

## 2024-02-10 MED ORDER — PIPERACILLIN-TAZOBACTAM 3.375 G IVPB
3.3750 g | Freq: Three times a day (TID) | INTRAVENOUS | Status: DC
  Administered 2024-02-10 – 2024-02-15 (×15): 3.375 g via INTRAVENOUS
  Filled 2024-02-10 (×14): qty 50

## 2024-02-10 MED ORDER — ONDANSETRON HCL 4 MG/2ML IJ SOLN
4.0000 mg | Freq: Four times a day (QID) | INTRAMUSCULAR | Status: DC | PRN
  Administered 2024-02-10: 4 mg via INTRAVENOUS
  Filled 2024-02-10: qty 2

## 2024-02-10 NOTE — Progress Notes (Signed)
 CARDIAC REHAB PHASE I     Post OHS education including site care, restrictions, heart healthy diet, sternal precautions, IS use at discharge, exercise guidelines and CRP2 reviewed. All questions and concerns addressed. Will refer to [facility] for CRP2. Pending SNF placement once medically ready. PT/OT/mobility assisting with complex mobility needs. CRP1 will sign off today.   [id number] Ronny Colas, RN BSN 02/10/2024 11:13 AM

## 2024-02-10 NOTE — Plan of Care (Signed)

## 2024-02-10 NOTE — Plan of Care (Signed)
   Problem: Education: Goal: Knowledge of General Education information will improve Description: Including pain rating scale, medication(s)/side effects and non-pharmacologic comfort measures Outcome: Progressing   Problem: Clinical Measurements: Goal: Ability to maintain clinical measurements within normal limits will improve Outcome: Progressing

## 2024-02-10 NOTE — Progress Notes (Addendum)
 [address]       [place] [address]             [phone]      14 Days Post-Op Procedure(s) (LRB): CORONARY ARTERY BYPASS GRAFTING X 4, USING LEFT INTERNAL MAMMARY ARTERY AND RIGHT ENDOSCOPIC HARVESTED GREATER SAPHENOUS VEIN (N/A) ECHOCARDIOGRAM, TRANSESOPHAGEAL (N/A) Subjective: The patient states she didn't get any sleep last night. She does admit to walking in the halls once yesterday.  Objective: Vital signs in last 24 hours: Temp:  [97.4 F (36.3 C)-98.1 F (36.7 C)] 98.1 F (36.7 C) (05/23 0250) Pulse Rate:  [77-83] 81 (05/23 0250) Cardiac Rhythm: Normal sinus rhythm (05/22 1907) Resp:  [17-20] 20 (05/23 0250) BP: (94-180)/(51-70) 144/60 (05/23 0250) SpO2:  [91 %-96 %] 93 % (05/23 0250) Weight:  [56 kg] 56 kg (05/23 0250)  Hemodynamic parameters for last 24 hours:    Intake/Output from previous day: No intake/output data recorded. Intake/Output this shift: No intake/output data recorded.  General appearance: alert, cooperative, and no distress Neurologic: intact Heart: regular rate and rhythm, S1, S2 normal, no murmur, click, rub or gallop Lungs: diminished bibasilar and left apical breath sounds Abdomen: soft, non-tender; bowel sounds normal; no masses,  no organomegaly Extremities: extremities normal, atraumatic, no cyanosis or edema Wound: Clean and dry without sign of infection  Lab Results: Recent Labs    02/10/24 0407  WBC 18.7*  HGB 11.2*  HCT 34.1*  PLT 643*   BMET: No results for input(s): "NA", "K", "CL", "CO2", "GLUCOSE", "BUN", "CREATININE", "CALCIUM " in the last 72 hours.  PT/INR: No results for input(s): "LABPROT", "INR" in the last 72 hours. ABG    Component Value Date/Time   PHART 7.453 (H) 01/30/2024 1036   HCO3 23.9 01/30/2024 1036   TCO2 25 01/30/2024 1036   ACIDBASEDEF 4.0 (H) 01/29/2024 2050   O2SAT 70 02/04/2024 0448   CBG (last 3)  No results for input(s): "GLUCAP" in the last 72  hours.  Assessment/Plan: S/P Procedure(s) (LRB): CORONARY ARTERY BYPASS GRAFTING X 4, USING LEFT INTERNAL MAMMARY ARTERY AND RIGHT ENDOSCOPIC HARVESTED GREATER SAPHENOUS VEIN (N/A) ECHOCARDIOGRAM, TRANSESOPHAGEAL (N/A)  CV: Postop cardiogenic shock, AHF signed off. SBP mostly mostly 120s but up into the 140s at times, dropped into the 90s once last night. NSR, HR 70s-80s. On Midodrine  10mg  TID, may be able to decrease to 5mg  TID vs start low dose Lopressor . Hx of NSTEMI, on Plavix .   Pulm: Saturating well on 3L [place]. Left thoracentesis 05/20, only yielded 50cc of serous fluid and patient developed a post thoracentesis pneumothorax. She refused chest tube placement. CXR with slightly worsened left apical pneumothorax, new basilar portion and left basilar atelectasis/consolidation. Patient admits to dry cough every once in a while, no worse than prior to surgery. Offered chest tube placement again today, patient continues to refuse. She understands the risks of complications including death. Encourage IS, flutter valve, and ambulation. Wean O2 as tolerated for O2>90%.   GI: +BM, tolerating a diet   Endo: No hx of DM, SSI and CBGs have been d/c'd. Hypothyroid, on Levothyroxine    Renal: Last Cr 1.0, stable. No UO recorded. Under preop weight. Will continue Torsemide  M,W,F. Last K 4.4. Completed Bactrim course for UTI.   ID: Leukocytosis elevated 18.7 this AM. Afebrile. Completed a 3 day course of Bactrim for UTI and WBC was trending down but is now elevated again. Consolidation vs atelectasis on CXR may be source but no cough and afebrile. No  sign of infection at incision sites. May start empiric abx for possible pulmonary source of infection.   Expected postop ABLA: H/H 11.2/34.1, stable. Not clinically significant at this time. Likely reactive thrombocytosis, plt 643,000. Will monitor.   DVT Prophylaxis: Patient is not very mobile, will restart Lovenox . Continue ASA and Plavix .   Deconditioning:  Continue work with PT/OT. Patient walked once in the hall yesterday and was up in the chair for 2 hours. Discussed goal is at least 2 walks in the hall today and this weekend. Not a rehab candidate due to no home supervision. CSW following for SNF placement.  Right heel pain: No sign of bruising, pain present since SCDs were placed. Will have RN place Mepilex dressing over right heel.   Dispo: Dispo planning, still unable to wean oxygen due to atelectasis/consolidation and left pneumothorax. Now WBC elevated. Very deconditioned. Hopefully can d/c to SNF early next week.    LOS: 18 days    Randa Burton, PA-C 02/10/2024   Chart reviewed, patient examined, agree with above.  I don't think her ptx is any larger than it was on CXR yesterday. It is just that the basal/lateral component is more visible today. It was there yesterday. She says her breathing is ok and ambulated a little today. Says she is working on IS and flutter valve.  It is not clear why her WBC ct is trending up to 18.7 but could be due to the LLL collapse. Started on empirin Zosyn. Incision looks fine. No fever.

## 2024-02-11 ENCOUNTER — Inpatient Hospital Stay (HOSPITAL_COMMUNITY)

## 2024-02-11 LAB — CBC
HCT: 35.2 % — ABNORMAL LOW (ref 36.0–46.0)
Hemoglobin: 11.2 g/dL — ABNORMAL LOW (ref 12.0–15.0)
MCH: 30.7 pg (ref 26.0–34.0)
MCHC: 31.8 g/dL (ref 30.0–36.0)
MCV: 96.4 fL (ref 80.0–100.0)
Platelets: 658 10*3/uL — ABNORMAL HIGH (ref 150–400)
RBC: 3.65 MIL/uL — ABNORMAL LOW (ref 3.87–5.11)
RDW: 14.4 % (ref 11.5–15.5)
WBC: 18.4 10*3/uL — ABNORMAL HIGH (ref 4.0–10.5)
nRBC: 0 % (ref 0.0–0.2)

## 2024-02-11 NOTE — Progress Notes (Signed)
 Mobility Specialist Progress Note:   02/11/24 1516  Mobility  Activity Transferred to/from [facility]  Level of Assistance Minimal assist, patient does 75% or more  Assistive Device Front wheel walker  Distance Ambulated (ft) 4 ft  RUE Weight Bearing Per Provider Order NWB  LUE Weight Bearing Per Provider Order NWB  Activity Response Tolerated well  Mobility Referral Yes  Mobility visit 1 Mobility  Mobility Specialist Start Time (ACUTE ONLY) 1512  Mobility Specialist Stop Time (ACUTE ONLY) 1516  Mobility Specialist Time Calculation (min) (ACUTE ONLY) 4 min   Pt received in bed, requesting assistance to [facility]. MinA to adhere to sternal precautions and sit EOB. HHA to pivot. Tolerated well, asx throughout. Left pt on BSC with call bell in reach. Encouraged pt to call for assistance when ready to transfer back to bed. All needs met.    Alayziah Tangeman Mobility Specialist Please contact via Special educational needs teacher or  Rehab office at [phone]

## 2024-02-11 NOTE — Progress Notes (Addendum)
 15 Days Post-Op Procedure(s) (LRB): CORONARY ARTERY BYPASS GRAFTING X 4, USING LEFT INTERNAL MAMMARY ARTERY AND RIGHT ENDOSCOPIC HARVESTED GREATER SAPHENOUS VEIN (N/A) ECHOCARDIOGRAM, TRANSESOPHAGEAL (N/A) Subjective: Feels ok, some nausea- better w/zofran   Objective: Vital signs in last 24 hours: Temp:  [97.6 F (36.4 C)-99.1 F (37.3 C)] 97.6 F (36.4 C) (05/24 0350) Pulse Rate:  [68-77] 77 (05/24 0350) Cardiac Rhythm: Normal sinus rhythm (05/23 1911) Resp:  [18-25] 18 (05/24 0350) BP: (97-121)/(54-69) 103/68 (05/24 0350) SpO2:  [91 %-97 %] 96 % (05/24 0350) Weight:  [58.5 kg] 58.5 kg (05/24 0434)  Hemodynamic parameters for last 24 hours:    Intake/Output from previous day: 05/23 0701 - 05/24 0700 In: 50 [IV Piggyback:50] Out: 550 [Urine:550] Intake/Output this shift: No intake/output data recorded.  General appearance: alert, cooperative, and no distress Heart: regular rate and rhythm Lungs: min dim in bases Abdomen: benign Extremities: no edema or calf tenderness Wound: incis healing well  Lab Results: Recent Labs    02/10/24 0407 02/11/24 0351  WBC 18.7* 18.4*  HGB 11.2* 11.2*  HCT 34.1* 35.2*  PLT 643* 658*   BMET: No results for input(s): "NA", "K", "CL", "CO2", "GLUCOSE", "BUN", "CREATININE", "CALCIUM " in the last 72 hours.  PT/INR: No results for input(s): "LABPROT", "INR" in the last 72 hours. ABG    Component Value Date/Time   PHART 7.453 (H) 01/30/2024 1036   HCO3 23.9 01/30/2024 1036   TCO2 25 01/30/2024 1036   ACIDBASEDEF 4.0 (H) 01/29/2024 2050   O2SAT 70 02/04/2024 0448   CBG (last 3)  No results for input(s): "GLUCAP" in the last 72 hours.  Meds Scheduled Meds:  ALPRAZolam   1 mg Oral QHS   amiodarone   200 mg Oral BID   aspirin  EC  81 mg Oral Daily   atorvastatin   80 mg Oral q1800   clopidogrel   75 mg Oral Daily   enoxaparin  (LOVENOX ) injection  30 mg Subcutaneous Q24H   feeding supplement  1 Container Oral TID BM   guaiFENesin    600 mg Oral BID   latanoprost   1 drop Both Eyes QHS   levothyroxine   88 mcg Oral Q0600   metoprolol  tartrate  12.5 mg Oral BID   midodrine   10 mg Oral TID WC   pantoprazole   40 mg Oral QHS   potassium chloride   20 mEq Oral Q M,W,F   sodium chloride  flush  3 mL Intravenous Q12H   torsemide   20 mg Oral Q M,W,F   Continuous Infusions:  piperacillin-tazobactam (ZOSYN)  IV 3.375 g (02/11/24 0221)   PRN Meds:.acetaminophen , ALPRAZolam , calcium  carbonate, lactulose, loperamide HCl, menthol -cetylpyridinium, naphazoline-glycerin , ondansetron , mouth rinse, oxyCODONE , senna-docusate, sodium chloride  flush, traMADol   Xrays DG CHEST PORT 1 VIEW Result Date: 02/10/2024 CLINICAL DATA:  Left-sided pneumothorax. EXAM: PORTABLE CHEST 1 VIEW COMPARISON:  02/09/2024 FINDINGS: Left-sided pneumothorax again noted, progressive in the interval with anterobasilar component now visible. Right lung stable. The cardio pericardial silhouette is enlarged. Telemetry leads overlie the chest. IMPRESSION: Progressive left-sided pneumothorax with anterobasilar component now visible. These results will be called to the ordering clinician or representative by the Radiologist Assistant, and communication documented in the PACS or [employer]. Electronically Signed   By: Donnal Fusi M.D.   On: 02/10/2024 09:31    Assessment/Plan: S/P Procedure(s) (LRB): CORONARY ARTERY BYPASS GRAFTING X 4, USING LEFT INTERNAL MAMMARY ARTERY AND RIGHT ENDOSCOPIC HARVESTED GREATER SAPHENOUS VEIN (N/A) ECHOCARDIOGRAM, TRANSESOPHAGEAL (N/A)  1 Tmax 99.1, VSS , sinus rthythm, s BP 90's-110's- AHF conts to assist w/management,  on midodrine , low dose beta blocker , plavix  for NSTEMI 2 sats ok - now on RA 3 ID- started on zosyn for fevers, poss pulm source, WBC stable at 18K 4 CXR - pntx appears stable, no definative signs of pneumonis 5 H.H stable 6 lovenox  for DVT ppx, also on asa/plavix  7 deconditioning remains an issue- cont therapies ,  SNF at d/c    LOS: 19 days    Lindi Revering PA-C Pager [phone] 02/11/2024   Agree  Dispo planning Aira Sallade Ala [place]

## 2024-02-12 NOTE — Progress Notes (Addendum)
 16 Days Post-Op Procedure(s) (LRB): CORONARY ARTERY BYPASS GRAFTING X 4, USING LEFT INTERNAL MAMMARY ARTERY AND RIGHT ENDOSCOPIC HARVESTED GREATER SAPHENOUS VEIN (N/A) ECHOCARDIOGRAM, TRANSESOPHAGEAL (N/A) Subjective: Feels ok, some soreness  Objective: Vital signs in last 24 hours: Temp:  [98 F (36.7 C)-98.7 F (37.1 C)] 98 F (36.7 C) (05/25 0305) Pulse Rate:  [60-82] 82 (05/25 0305) Cardiac Rhythm: Normal sinus rhythm (05/24 1913) Resp:  [17-20] 17 (05/25 0305) BP: (90-139)/(47-66) 96/66 (05/25 0305) SpO2:  [94 %-100 %] 96 % (05/25 0305) Weight:  [59.9 kg] 59.9 kg (05/25 0508)  Hemodynamic parameters for last 24 hours:    Intake/Output from previous day: 05/24 0701 - 05/25 0700 In: 720 [P.O.:720] Out: -  Intake/Output this shift: No intake/output data recorded.  General appearance: alert, cooperative, and no distress Heart: regular rate and rhythm Lungs: slightly dim in bases Abdomen: benign Extremities: no edema Wound: incis healing well  Lab Results: Recent Labs    02/10/24 0407 02/11/24 0351  WBC 18.7* 18.4*  HGB 11.2* 11.2*  HCT 34.1* 35.2*  PLT 643* 658*   BMET: No results for input(s): "NA", "K", "CL", "CO2", "GLUCOSE", "BUN", "CREATININE", "CALCIUM " in the last 72 hours.  PT/INR: No results for input(s): "LABPROT", "INR" in the last 72 hours. ABG    Component Value Date/Time   PHART 7.453 (H) 01/30/2024 1036   HCO3 23.9 01/30/2024 1036   TCO2 25 01/30/2024 1036   ACIDBASEDEF 4.0 (H) 01/29/2024 2050   O2SAT 70 02/04/2024 0448   CBG (last 3)  No results for input(s): "GLUCAP" in the last 72 hours.  Meds Scheduled Meds:  ALPRAZolam   1 mg Oral QHS   amiodarone   200 mg Oral BID   aspirin  EC  81 mg Oral Daily   atorvastatin   80 mg Oral q1800   clopidogrel   75 mg Oral Daily   enoxaparin  (LOVENOX ) injection  30 mg Subcutaneous Q24H   feeding supplement  1 Container Oral TID BM   guaiFENesin   600 mg Oral BID   latanoprost   1 drop Both Eyes QHS    levothyroxine   88 mcg Oral Q0600   metoprolol  tartrate  12.5 mg Oral BID   midodrine   10 mg Oral TID WC   pantoprazole   40 mg Oral QHS   potassium chloride   20 mEq Oral Q M,W,F   sodium chloride  flush  3 mL Intravenous Q12H   torsemide   20 mg Oral Q M,W,F   Continuous Infusions:  piperacillin-tazobactam (ZOSYN)  IV 3.375 g (02/12/24 0300)   PRN Meds:.acetaminophen , ALPRAZolam , calcium  carbonate, lactulose, loperamide HCl, menthol -cetylpyridinium, naphazoline-glycerin , ondansetron , mouth rinse, oxyCODONE , senna-docusate, sodium chloride  flush, traMADol   Xrays DG CHEST PORT 1 VIEW Result Date: 02/11/2024 CLINICAL DATA:  [id number] Pneumothorax on left [id number] EXAM: PORTABLE CHEST - 1 VIEW COMPARISON:  the previous day's study FINDINGS: Persistent left pneumothorax, apex projecting at the level of the posterior aspect left fourth rib as before. Progressive consolidation/atelectasis at the left lung base with possible small effusion. Right lung clear. Heart size and mediastinal contours are within normal limits. Aortic Atherosclerosis (ICD10-170.0). CABG markers. Sternotomy wires. IMPRESSION: 1. Stable left pneumothorax. 2. Progressive left basilar consolidation/atelectasis. Electronically Signed   By: Nicoletta Barrier M.D.   On: 02/11/2024 09:40    Assessment/Plan: S/P Procedure(s) (LRB): CORONARY ARTERY BYPASS GRAFTING X 4, USING LEFT INTERNAL MAMMARY ARTERY AND RIGHT ENDOSCOPIC HARVESTED GREATER SAPHENOUS VEIN (N/A) ECHOCARDIOGRAM, TRANSESOPHAGEAL (N/A)  1 afeb, VSS, s BP 90's-130's, NSR on amio- on midodrine  and low dose beta blocker 2  O2 sats ok on 3 liters- has been on RA at times 3 weight slightly up today, voiding- unmeasured 4 on zosyn for poss pulm source of leukocytosis- repeat CBC in am 5 no new labs- repeat in am 6 pneumothorax- will get f/u CXR in am- has refused chest tube  7 ASA and plavix  for NSTEMI, lovenox  for DVT ppx 8 cont current diuretics- M W F dosing per AHF( torsemide   20) 9 SNF at D/C- cont therapies    LOS: 20 days    Lindi Revering PA-C Pager [phone] 02/12/2024   Agree Dispo planning  Lynne Takemoto Ala [place]

## 2024-02-12 NOTE — Progress Notes (Signed)
 Mobility Specialist Progress Note:    02/12/24 1427  Mobility  Activity Transferred to/from [facility]  Level of Assistance Contact guard assist, steadying assist  Assistive Device Other (Comment)  Distance Ambulated (ft) 2 ft  RUE Weight Bearing Per Provider Order NWB  LUE Weight Bearing Per Provider Order NWB  Activity Response Tolerated well  Mobility Referral Yes  Mobility visit 1 Mobility  Mobility Specialist Start Time (ACUTE ONLY)  (1423)  Mobility Specialist Stop Time (ACUTE ONLY) 1427   Pt received on BSC, requesting assistance to transfer back to bed. CGA for safety. Tolerated well, asx throughout. Pt deferred ambulating in halls/sitting up in chair at this time.  Pt lying in bed with all needs met.   Regina Ganci Mobility Specialist Please contact via Special educational needs teacher or  Rehab office at [phone]

## 2024-02-13 ENCOUNTER — Inpatient Hospital Stay (HOSPITAL_COMMUNITY)

## 2024-02-13 LAB — CBC
HCT: 34.1 % — ABNORMAL LOW (ref 36.0–46.0)
Hemoglobin: 10.7 g/dL — ABNORMAL LOW (ref 12.0–15.0)
MCH: 30.9 pg (ref 26.0–34.0)
MCHC: 31.4 g/dL (ref 30.0–36.0)
MCV: 98.6 fL (ref 80.0–100.0)
Platelets: 610 10*3/uL — ABNORMAL HIGH (ref 150–400)
RBC: 3.46 MIL/uL — ABNORMAL LOW (ref 3.87–5.11)
RDW: 14.2 % (ref 11.5–15.5)
WBC: 11.4 10*3/uL — ABNORMAL HIGH (ref 4.0–10.5)
nRBC: 0 % (ref 0.0–0.2)

## 2024-02-13 LAB — BASIC METABOLIC PANEL WITH GFR
Anion gap: 9 (ref 5–15)
BUN: 36 mg/dL — ABNORMAL HIGH (ref 8–23)
CO2: 27 mmol/L (ref 22–32)
Calcium: 8.9 mg/dL (ref 8.9–10.3)
Chloride: 100 mmol/L (ref 98–111)
Creatinine, Ser: 1.39 mg/dL — ABNORMAL HIGH (ref 0.44–1.00)
GFR, Estimated: 38 mL/min — ABNORMAL LOW (ref >60)
Glucose, Bld: 85 mg/dL (ref 70–99)
Potassium: 4.3 mmol/L (ref 3.5–5.1)
Sodium: 136 mmol/L (ref 135–145)

## 2024-02-13 MED ORDER — GUAIFENESIN ER 600 MG PO TB12: 600.0000 mg | ORAL_TABLET | Freq: Two times a day (BID) | ORAL | Status: DC | PRN

## 2024-02-13 MED ORDER — POTASSIUM CHLORIDE CRYS ER 20 MEQ PO TBCR: 20.0000 meq | EXTENDED_RELEASE_TABLET | ORAL | Status: DC

## 2024-02-13 MED ORDER — TORSEMIDE 20 MG PO TABS: 20.0000 mg | ORAL_TABLET | ORAL | Status: DC

## 2024-02-13 MED ORDER — AMIODARONE HCL 200 MG PO TABS
200.0000 mg | ORAL_TABLET | Freq: Every day | ORAL | Status: DC
  Administered 2024-02-13 – 2024-02-15 (×3): 200 mg via ORAL
  Filled 2024-02-13 (×3): qty 1

## 2024-02-13 NOTE — Progress Notes (Addendum)
 Mobility Specialist: Progress Note   02/13/24 1530  Mobility  Activity Ambulated with assistance in hallway;Ambulated with assistance to bathroom  Level of Assistance +2 (takes two people) (Chair follow)  Location manager Ambulated (ft) 300 ft  RUE Weight Bearing Per Provider Order NWB  LUE Weight Bearing Per Provider Order NWB  Activity Response Tolerated well  Mobility Referral Yes  Mobility visit 1 Mobility  Mobility Specialist Start Time (ACUTE ONLY) 1235  Mobility Specialist Stop Time (ACUTE ONLY) 1300  Mobility Specialist Time Calculation (min) (ACUTE ONLY) 25 min    Pt received in bed, pleasant and eager for mobility session. MinA for bed mobility to assist with rolling and trunk elevation. MinG for STS to steady, CG for ambulation with +2 for chair follow as requested by pt. Min verbal and tactile cues required to adhere to sternal precautions. C/o feeling weak and a little SOB throughout ambulation. SpO2 WFL on 2LO2. Requesting to use the BR upon returning to the room, void successful and pericare done ind in sitting. Left in bed with all needs met, call bell in reach. RN present.    Deloria Fetch Mobility Specialist Please contact via [employer] or Rehab office at [phone]

## 2024-02-13 NOTE — Progress Notes (Addendum)
 [address]       [place] [address]             [phone]      17 Days Post-Op Procedure(s) (LRB): CORONARY ARTERY BYPASS GRAFTING X 4, USING LEFT INTERNAL MAMMARY ARTERY AND RIGHT ENDOSCOPIC HARVESTED GREATER SAPHENOUS VEIN (N/A) ECHOCARDIOGRAM, TRANSESOPHAGEAL (N/A) Subjective: Patient states she walked the halls this morning. She was slightly short of breath but states she feels good today.   Objective: Vital signs in last 24 hours: Temp:  [97.6 F (36.4 C)-98.1 F (36.7 C)] 98.1 F (36.7 C) (05/26 0749) Pulse Rate:  [59-75] 67 (05/26 0749) Cardiac Rhythm: Normal sinus rhythm (05/26 0700) Resp:  [15-20] 20 (05/26 0749) BP: (109-143)/(53-84) 110/66 (05/26 0749) SpO2:  [93 %-98 %] 98 % (05/26 0749) Weight:  [54.9 kg] 54.9 kg (05/26 0500)  Hemodynamic parameters for last 24 hours:    Intake/Output from previous day: 05/25 0701 - 05/26 0700 In: 300 [P.O.:300] Out: 300 [Urine:300] Intake/Output this shift: No intake/output data recorded.  General appearance: alert, cooperative, and no distress Neurologic: intact Heart: regular rate and rhythm, S1, S2 normal, no murmur, click, rub or gallop Lungs: diminished left basilar breath sounds Abdomen: soft, non-tender; bowel sounds normal; no masses,  no organomegaly Extremities: extremities normal, atraumatic, no cyanosis or edema Wound: Clean and dry without sign of infection  Lab Results: Recent Labs    02/11/24 0351 02/13/24 0327  WBC 18.4* 11.4*  HGB 11.2* 10.7*  HCT 35.2* 34.1*  PLT 658* 610*   BMET:  Recent Labs    02/13/24 0327  NA 136  K 4.3  CL 100  CO2 27  GLUCOSE 85  BUN 36*  CREATININE 1.39*  CALCIUM  8.9    PT/INR: No results for input(s): "LABPROT", "INR" in the last 72 hours. ABG    Component Value Date/Time   PHART 7.453 (H) 01/30/2024 1036   HCO3 23.9 01/30/2024 1036   TCO2 25 01/30/2024 1036   ACIDBASEDEF 4.0 (H) 01/29/2024 2050   O2SAT 70 02/04/2024 0448    CBG (last 3)  No results for input(s): "GLUCAP" in the last 72 hours.  Assessment/Plan: S/P Procedure(s) (LRB): CORONARY ARTERY BYPASS GRAFTING X 4, USING LEFT INTERNAL MAMMARY ARTERY AND RIGHT ENDOSCOPIC HARVESTED GREATER SAPHENOUS VEIN (N/A) ECHOCARDIOGRAM, TRANSESOPHAGEAL (N/A)  CV: Postop cardiogenic shock, AHF signed off. SBP mostly 110s. NSR, HR 60s-70s. Postop afib, On Amiodarone  200mg  BID since 05/17, will decrease to 200mg  daily. On Midodrine  10mg  TID due to soft BP and low dose Lopressor . Hx of NSTEMI, on Plavix .   Pulm: Saturating well on 1.5L [place] this AM, down from 3L. Post thoracentesis left pneumothorax, refused chest tube. CXR with stable left thoracentesis and progressive left basilar atelectasis/consolidation. Encourage IS, flutter valve, and ambulation. Wean O2 as tolerated for O2>90%.   GI: +BM, tolerating a diet   Endo: No hx of DM, SSI and CBGs have been d/c'd. Hypothyroid, on Levothyroxine    Renal: AKI 1.39 this AM, possibly due to diuresis vs Zosyn. Will reach out to pharmacy. 300cc/24hrs UO recorded. Under preop weight. On Torsemide  M,W,F per AHF. Will hold today due to elevated creatinine. K 4.3, at goal will hold supplement today.    ID: Leukocytosis elevated 18.7 05/24, now down to 11.4. Tmax 98.1. On empiric Zosyn for possible pulmonary source of infection. Will continue for 5 days.    Expected postop ABLA: H/H 10.7/34.1, stable. Not clinically significant at this time. Likely reactive thrombocytosis, plt 610,000  trending down.    DVT Prophylaxis: Lovenox . Continue ASA and Plavix .   Deconditioning: Progressing with ambulation. Continue work with PT/OT. Not a rehab candidate due to no home supervision. CSW following for SNF placement.   Dispo: Hopefully can d/c to SNF next 48 hours if she continues to progress   LOS: 21 days    Randa Burton, PA-C 02/13/2024   Agree CXR stable, less O2 requirements PT/OT  Hilarie Lovely

## 2024-02-13 NOTE — Plan of Care (Signed)
  Problem: Education: Goal: Knowledge of General Education information will improve Description: Including pain rating scale, medication(s)/side effects and non-pharmacologic comfort measures Outcome: Progressing   Problem: Health Behavior/Discharge Planning: Goal: Ability to manage health-related needs will improve Outcome: Progressing   Problem: Clinical Measurements: Goal: Ability to maintain clinical measurements within normal limits will improve Outcome: Progressing Goal: Will remain free from infection Outcome: Progressing Goal: Diagnostic test results will improve Outcome: Progressing Goal: Respiratory complications will improve Outcome: Progressing Goal: Cardiovascular complication will be avoided Outcome: Progressing   Problem: Activity: Goal: Risk for activity intolerance will decrease Outcome: Progressing   Problem: Nutrition: Goal: Adequate nutrition will be maintained Outcome: Progressing   Problem: Coping: Goal: Level of anxiety will decrease Outcome: Progressing   Problem: Elimination: Goal: Will not experience complications related to bowel motility Outcome: Progressing Goal: Will not experience complications related to urinary retention Outcome: Progressing   Problem: Pain Managment: Goal: General experience of comfort will improve and/or be controlled Outcome: Progressing   Problem: Safety: Goal: Ability to remain free from injury will improve Outcome: Progressing   Problem: Skin Integrity: Goal: Risk for impaired skin integrity will decrease Outcome: Progressing   Problem: Education: Goal: Will demonstrate proper wound care and an understanding of methods to prevent future damage Outcome: Progressing Goal: Knowledge of disease or condition will improve Outcome: Progressing Goal: Knowledge of the prescribed therapeutic regimen will improve Outcome: Progressing   Problem: Activity: Goal: Risk for activity intolerance will decrease Outcome:  Progressing   Problem: Cardiac: Goal: Will achieve and/or maintain hemodynamic stability Outcome: Progressing   Problem: Clinical Measurements: Goal: Postoperative complications will be avoided or minimized Outcome: Progressing   Problem: Respiratory: Goal: Respiratory status will improve Outcome: Progressing   Problem: Skin Integrity: Goal: Wound healing without signs and symptoms of infection Outcome: Progressing Goal: Risk for impaired skin integrity will decrease Outcome: Progressing   Problem: Education: Goal: Ability to describe self-care measures that may prevent or decrease complications (Diabetes Survival Skills Education) will improve Outcome: Progressing Goal: Individualized Educational Video(s) Outcome: Progressing   Problem: Coping: Goal: Ability to adjust to condition or change in health will improve Outcome: Progressing   Problem: Fluid Volume: Goal: Ability to maintain a balanced intake and output will improve Outcome: Progressing   Problem: Health Behavior/Discharge Planning: Goal: Ability to identify and utilize available resources and services will improve Outcome: Progressing Goal: Ability to manage health-related needs will improve Outcome: Progressing   Problem: Metabolic: Goal: Ability to maintain appropriate glucose levels will improve Outcome: Progressing   Problem: Nutritional: Goal: Maintenance of adequate nutrition will improve Outcome: Progressing Goal: Progress toward achieving an optimal weight will improve Outcome: Progressing   Problem: Skin Integrity: Goal: Risk for impaired skin integrity will decrease Outcome: Progressing   Problem: Tissue Perfusion: Goal: Adequacy of tissue perfusion will improve Outcome: Progressing

## 2024-02-13 NOTE — Progress Notes (Signed)
 Physical Therapy Treatment Patient Details Name: Kaitlyn Wright MRN: [id number] DOB: 1946/03/02 Today's Date: 02/13/2024   History of Present Illness 78 year old woman  who presented with chest pain that she attributed to acid reflux on 5/5. +NSTEMI; LHC with 3v CAD; CABG 5/09; post-op cardiogenic shock and remained intubated until 5/12; Echo EF 60-65%  PMH- remote tobacco abuse, hypothyroidism, anxiety    PT Comments  Patient progressing well towards goals (updated based on timeframe). She continues to be anxious re: ambulation and scared she will need to sit and rest. Chair follow to incr her confidence and distance. Walking on 2L with sats 100% x 230 ft total (two standing rest breaks). Encouraged another walk today and pt states she will ask nursing to assist her.     If plan is discharge home, recommend the following: Assistance with cooking/housework;Assist for transportation;A little help with walking and/or transfers;A little help with bathing/dressing/bathroom   Can travel by private vehicle     Yes  Equipment Recommendations  Rolling walker (2 wheels)    Recommendations for Other Services       Precautions / Restrictions Precautions Precautions: Fall;Sternal Precaution Booklet Issued: No Recall of Precautions/Restrictions: Impaired Precaution/Restrictions Comments: able to verbalize precautions, however, needed cues for adherance during transfers     Mobility  Bed Mobility Overal bed mobility: Needs Assistance Bed Mobility: Rolling, Sidelying to Sit, Sit to Sidelying Rolling: Contact guard assist Sidelying to sit: Min assist     Sit to sidelying: Min assist General bed mobility comments: cues for technique; assist to raise torso and then to raise legs    Transfers Overall transfer level: Needs assistance Equipment used: Rolling walker (2 wheels) Transfers: Sit to/from Stand Sit to Stand: Min assist           General transfer comment: cues for hand placement  and technique, assist to rise and steady    Ambulation/Gait Ambulation/Gait assistance: +2 safety/equipment, Contact guard assist Gait Distance (Feet): 100 Feet (standing rest; 100, standing rest; 30) Assistive device: Rolling walker (2 wheels) Gait Pattern/deviations: Step-through pattern, Decreased stride length, Trunk flexed Gait velocity: dec     General Gait Details: very anxious and only would walk if chair follow provided; tends to push RW too far ahead, especially in turns and nearly tangles her feet with back legs of RW   Stairs             Wheelchair Mobility     Tilt Bed    Modified Rankin (Stroke Patients Only)       Balance Overall balance assessment: Needs assistance Sitting-balance support: Feet supported Sitting balance-Leahy Scale: Fair     Standing balance support: No upper extremity supported, During functional activity Standing balance-Leahy Scale: Fair                              Hotel manager: No apparent difficulties  Cognition Arousal: Alert Behavior During Therapy: Anxious   PT - Cognitive impairments: No apparent impairments                         Following commands: Intact      Cueing Cueing Techniques: Verbal cues  Exercises      General Comments General comments (skin integrity, edema, etc.): on 2L with sats 100% while walking      Pertinent Vitals/Pain Pain Assessment Pain Assessment: No/denies pain    Home Living  Prior Function            PT Goals (current goals can now be found in the care plan section) Acute Rehab PT Goals Patient Stated Goal: return home PT Goal Formulation: With patient Time For Goal Achievement: 02/27/24 Potential to Achieve Goals: Good Progress towards PT goals: Progressing toward goals;Goals updated    Frequency    Min 2X/week      PT Plan      Co-evaluation              AM-PAC  PT "6 Clicks" Mobility   Outcome Measure  Help needed turning from your back to your side while in a flat bed without using bedrails?: A Little Help needed moving from lying on your back to sitting on the side of a flat bed without using bedrails?: A Little Help needed moving to and from a bed to a chair (including a wheelchair)?: A Little Help needed standing up from a chair using your arms (e.g., wheelchair or bedside chair)?: A Little Help needed to walk in hospital room?: A Little Help needed climbing 3-5 steps with a railing? : Total 6 Click Score: 16    End of Session Equipment Utilized During Treatment: Oxygen;Gait belt Activity Tolerance: Patient tolerated treatment well Patient left: with call bell/phone within reach;in bed Nurse Communication: Mobility status;Other (comment) (agrees to walk a second walk with nursing) PT Visit Diagnosis: Muscle weakness (generalized) (M62.81);Difficulty in walking, not elsewhere classified (R26.2)     Time: 1610-9604 PT Time Calculation (min) (ACUTE ONLY): 24 min  Charges:    $Gait Training: 23-37 mins PT General Charges $$ ACUTE PT VISIT: 1 Visit                      Kaitlyn Wright, PT Acute Rehabilitation Services  Office [phone]    Kaitlyn Wright 02/13/2024, 8:35 AM

## 2024-02-14 ENCOUNTER — Inpatient Hospital Stay (HOSPITAL_COMMUNITY)

## 2024-02-14 LAB — CBC
HCT: 32.6 % — ABNORMAL LOW (ref 36.0–46.0)
Hemoglobin: 10.6 g/dL — ABNORMAL LOW (ref 12.0–15.0)
MCH: 31.5 pg (ref 26.0–34.0)
MCHC: 32.5 g/dL (ref 30.0–36.0)
MCV: 96.7 fL (ref 80.0–100.0)
Platelets: 544 10*3/uL — ABNORMAL HIGH (ref 150–400)
RBC: 3.37 MIL/uL — ABNORMAL LOW (ref 3.87–5.11)
RDW: 14.1 % (ref 11.5–15.5)
WBC: 10.1 10*3/uL (ref 4.0–10.5)
nRBC: 0 % (ref 0.0–0.2)

## 2024-02-14 LAB — BASIC METABOLIC PANEL WITH GFR
Anion gap: 9 (ref 5–15)
BUN: 29 mg/dL — ABNORMAL HIGH (ref 8–23)
CO2: 28 mmol/L (ref 22–32)
Calcium: 9.1 mg/dL (ref 8.9–10.3)
Chloride: 100 mmol/L (ref 98–111)
Creatinine, Ser: 1.22 mg/dL — ABNORMAL HIGH (ref 0.44–1.00)
GFR, Estimated: 45 mL/min — ABNORMAL LOW (ref >60)
Glucose, Bld: 86 mg/dL (ref 70–99)
Potassium: 4.2 mmol/L (ref 3.5–5.1)
Sodium: 137 mmol/L (ref 135–145)

## 2024-02-14 MED ORDER — TORSEMIDE 20 MG PO TABS: 20.0000 mg | ORAL_TABLET | ORAL | Status: DC

## 2024-02-14 MED ORDER — ALUM & MAG HYDROXIDE-SIMETH 200-200-20 MG/5ML PO SUSP
15.0000 mL | Freq: Three times a day (TID) | ORAL | Status: DC
  Administered 2024-02-14 (×2): 15 mL via ORAL
  Filled 2024-02-14 (×4): qty 30

## 2024-02-14 MED ORDER — TORSEMIDE 20 MG PO TABS: 20.0000 mg | ORAL_TABLET | Freq: Every day | ORAL | Status: DC | PRN

## 2024-02-14 NOTE — Progress Notes (Signed)
 Occupational Therapy Treatment Patient Details Name: Kaitlyn Wright MRN: [id number] DOB: 11/02/1945 Today's Date: 02/14/2024   History of present illness 78 year old woman  who presented with chest pain that she attributed to acid reflux on 5/5. +NSTEMI; LHC with 3v CAD; CABG 5/09; post-op cardiogenic shock and remained intubated until 5/12; Echo EF 60-65%  PMH- remote tobacco abuse, hypothyroidism, anxiety   OT comments  Pt is progressing towards goals. Pt goals updated today. Pt still woth limited recall of sternal precautions requiring cues to maintain throughout session, and required re-education for "move in the tube". Pt eager to work with therapy and was min A for transfers with RW (pushing off knees and utilizing rocking momentum) and GCA for standing grooming at sink (3 activities), peri care in sitting. Pt continues to progress towards goals and continues to require skilled OT in the acute setting as well as afterwards at rehab of <3 hours daily to maximize safety and independence in ADL and functional transfers.       If plan is discharge home, recommend the following:  A little help with walking and/or transfers;A lot of help with bathing/dressing/bathroom;Assistance with cooking/housework;Assist for transportation;Help with stairs or ramp for entrance   Equipment Recommendations  Other (comment) (defer to next venue of care)    Recommendations for Other Services      Precautions / Restrictions Precautions Precautions: Fall;Sternal Precaution Booklet Issued: No Recall of Precautions/Restrictions: Impaired Precaution/Restrictions Comments: needed cues for adherence during transfers Restrictions Weight Bearing Restrictions Per Provider Order: No Other Position/Activity Restrictions: sternal precautions       Mobility Bed Mobility Overal bed mobility: Needs Assistance Bed Mobility: Rolling, Sidelying to Sit, Sit to Sidelying Rolling: Min assist Sidelying to sit: Min assist      Sit to sidelying: Min assist General bed mobility comments: cues for technique; assist to raise torso and then to raise legs    Transfers Overall transfer level: Needs assistance Equipment used: Rolling walker (2 wheels) Transfers: Sit to/from Stand Sit to Stand: Min assist           General transfer comment: cues for hand placement and technique, assist to rise and steady     Balance Overall balance assessment: Needs assistance Sitting-balance support: Feet supported Sitting balance-Leahy Scale: Fair     Standing balance support: No upper extremity supported, During functional activity Standing balance-Leahy Scale: Fair Standing balance comment: reliant on external support                           ADL either performed or assessed with clinical judgement   ADL Overall ADL's : Needs assistance/impaired     Grooming: Oral care;Wash/dry face;Wash/dry hands;Set up;Standing Grooming Details (indicate cue type and reason): sink level                 Toilet Transfer: Contact guard assist;Ambulation Toilet Transfer Details (indicate cue type and reason): vc for safe hand placement Toileting- Clothing Manipulation and Hygiene: Set up;Sitting/lateral lean       Functional mobility during ADLs: Contact guard assist;Rolling walker (2 wheels) General ADL Comments: Pt very motivated today. Able to demonstrate increased activity tolerance for standing grooming tasks at sink level in addition to toilet transfer and peri care. Pt did not recall "move in the tube" terminology for precautions, re-educated and reinforced precautions throughout session    Extremity/Trunk Assessment Upper Extremity Assessment Upper Extremity Assessment: Generalized weakness  Vision       Perception     Praxis     Communication Communication Communication: No apparent difficulties   Cognition Arousal: Alert Behavior During Therapy: Anxious Cognition:  Cognition impaired       Memory impairment (select all impairments): Short-term memory (sternal precautions) Attention impairment (select first level of impairment): Selective attention   OT - Cognition Comments: cues for sternal precautions                 Following commands: Intact        Cueing   Cueing Techniques: Verbal cues  Exercises      Shoulder Instructions       General Comments R throughout session and VSS    Pertinent Vitals/ Pain       Pain Assessment Pain Assessment: No/denies pain Pain Intervention(s): Monitored during session, Repositioned  Home Living                                          Prior Functioning/Environment              Frequency  Min 2X/week        Progress Toward Goals  OT Goals(current goals can now be found in the care plan section)  Progress towards OT goals: Progressing toward goals  Acute Rehab OT Goals Patient Stated Goal: get my hair combed OT Goal Formulation: With patient Time For Goal Achievement: 02/28/24 Potential to Achieve Goals: Good  Plan      Co-evaluation                 AM-PAC OT "6 Clicks" Daily Activity     Outcome Measure   Help from another person eating meals?: None Help from another person taking care of personal grooming?: A Little Help from another person toileting, which includes using toliet, bedpan, or urinal?: A Little Help from another person bathing (including washing, rinsing, drying)?: A Lot Help from another person to put on and taking off regular upper body clothing?: A Lot Help from another person to put on and taking off regular lower body clothing?: A Lot 6 Click Score: 16    End of Session Equipment Utilized During Treatment: Gait belt;Rolling walker (2 wheels)  OT Visit Diagnosis: Unsteadiness on feet (R26.81);Muscle weakness (generalized) (M62.81);Other symptoms and signs involving cognitive function   Activity Tolerance Patient  tolerated treatment well   Patient Left in bed;with bed alarm set;with call bell/phone within reach   Nurse Communication Mobility status;Precautions        Time: 9147-8295 OT Time Calculation (min): 23 min  Charges: OT General Charges $OT Visit: 1 Visit OT Treatments $Self Care/Home Management : 8-22 mins  [place] OTR/L Acute Rehabilitation Services Office: [phone]   Ebony Goldstein [facility] 02/14/2024, 11:13 AM

## 2024-02-14 NOTE — Progress Notes (Signed)
 Physical Therapy Treatment Patient Details Name: Kaitlyn Wright MRN: [id number] DOB: 31-Oct-1945 Today's Date: 02/14/2024   History of Present Illness 78 year old woman  who presented with chest pain that she attributed to acid reflux on 5/5. +NSTEMI; LHC with 3v CAD; CABG 5/09; post-op cardiogenic shock and remained intubated until 5/12; Echo EF 60-65%  PMH- remote tobacco abuse, hypothyroidism, anxiety    PT Comments  Patient in good spirits. Now on room air and maintained sats 97% during ambulation. Continues to require education and cues re: sternal precautions during all mobility. During transfers needs min assist for anterior weight-shift over BOS and steadying assist once standing.     If plan is discharge home, recommend the following: Assistance with cooking/housework;Assist for transportation;A little help with walking and/or transfers;A little help with bathing/dressing/bathroom;Help with stairs or ramp for entrance   Can travel by private vehicle     Yes  Equipment Recommendations  Rolling walker (2 wheels)    Recommendations for Other Services       Precautions / Restrictions Precautions Precautions: Fall;Sternal Precaution Booklet Issued: No Recall of Precautions/Restrictions: Impaired Precaution/Restrictions Comments: needed cues for adherence during transfers     Mobility  Bed Mobility Overal bed mobility: Needs Assistance Bed Mobility: Rolling, Sidelying to Sit, Sit to Sidelying Rolling: Min assist Sidelying to sit: Min assist     Sit to sidelying: Min assist General bed mobility comments: cues for technique; assist to raise torso and then to raise legs    Transfers Overall transfer level: Needs assistance Equipment used: Rolling walker (2 wheels) Transfers: Sit to/from Stand Sit to Stand: Min assist           General transfer comment: cues for hand placement and technique, assist to rise and steady    Ambulation/Gait Ambulation/Gait assistance: +2  safety/equipment, Contact guard assist Gait Distance (Feet): 180 Feet Assistive device: Rolling walker (2 wheels) Gait Pattern/deviations: Step-through pattern, Decreased stride length, Trunk flexed Gait velocity: dec     General Gait Details: anxious and prefers chair follow; tends to push RW too far ahead, especially in turns and nearly tangles her feet with back legs of RW   Stairs             Wheelchair Mobility     Tilt Bed    Modified Rankin (Stroke Patients Only)       Balance Overall balance assessment: Needs assistance Sitting-balance support: Feet supported Sitting balance-Leahy Scale: Fair     Standing balance support: No upper extremity supported, During functional activity Standing balance-Leahy Scale: Fair Standing balance comment: reliant on external support                            Communication Communication Communication: No apparent difficulties  Cognition Arousal: Alert Behavior During Therapy: Anxious   PT - Cognitive impairments: No apparent impairments                         Following commands: Intact      Cueing Cueing Techniques: Verbal cues  Exercises      General Comments General comments (skin integrity, edema, etc.): on RA with sats 97% during ambulation; max HR 84      Pertinent Vitals/Pain Pain Assessment Pain Assessment: No/denies pain    Home Living                          Prior Function  PT Goals (current goals can now be found in the care plan section) Acute Rehab PT Goals Patient Stated Goal: return home Time For Goal Achievement: 02/27/24 Potential to Achieve Goals: Good Progress towards PT goals: Progressing toward goals    Frequency    Min 2X/week      PT Plan      Co-evaluation              AM-PAC PT "6 Clicks" Mobility   Outcome Measure  Help needed turning from your back to your side while in a flat bed without using bedrails?: A  Little Help needed moving from lying on your back to sitting on the side of a flat bed without using bedrails?: A Little Help needed moving to and from a bed to a chair (including a wheelchair)?: A Little Help needed standing up from a chair using your arms (e.g., wheelchair or bedside chair)?: A Little Help needed to walk in hospital room?: A Little Help needed climbing 3-5 steps with a railing? : A Lot 6 Click Score: 17    End of Session Equipment Utilized During Treatment: Gait belt Activity Tolerance: Patient tolerated treatment well Patient left: with call bell/phone within reach;in bed   PT Visit Diagnosis: Muscle weakness (generalized) (M62.81);Difficulty in walking, not elsewhere classified (R26.2)     Time: 1610-9604 PT Time Calculation (min) (ACUTE ONLY): 12 min  Charges:    $Gait Training: 8-22 mins PT General Charges $$ ACUTE PT VISIT: 1 Visit                      Gayle Kava, PT Acute Rehabilitation Services  Office [phone]    [name] 02/14/2024, 9:38 AM

## 2024-02-14 NOTE — Progress Notes (Addendum)
 [address]       [place] [address]             [phone]      18 Days Post-Op Procedure(s) (LRB): CORONARY ARTERY BYPASS GRAFTING X 4, USING LEFT INTERNAL MAMMARY ARTERY AND RIGHT ENDOSCOPIC HARVESTED GREATER SAPHENOUS VEIN (N/A) ECHOCARDIOGRAM, TRANSESOPHAGEAL (N/A) Subjective: Patient reports burning from the pepper in her food. Otherwise no new complaints this AM. Walked twice yesterday.   Objective: Vital signs in last 24 hours: Temp:  [97.6 F (36.4 C)-98.2 F (36.8 C)] 97.7 F (36.5 C) (05/27 0438) Pulse Rate:  [59-71] 66 (05/27 0438) Cardiac Rhythm: Sinus bradycardia (05/27 0350) Resp:  [15-20] 17 (05/27 0438) BP: (104-122)/(49-66) 110/49 (05/27 0438) SpO2:  [97 %-99 %] 98 % (05/27 0438) Weight:  [54.5 kg] 54.5 kg (05/27 0500)  Hemodynamic parameters for last 24 hours:    Intake/Output from previous day: 05/26 0701 - 05/27 0700 In: 680 [P.O.:630; IV Piggyback:50] Out: 750 [Urine:750] Intake/Output this shift: No intake/output data recorded.  General appearance: alert, cooperative, and no distress Neurologic: intact Heart: regular rate and rhythm, S1, S2 normal, no murmur, click, rub or gallop Lungs: slightly diminished left basilar breath sounds Abdomen: soft, non-tender; bowel sounds normal; no masses,  no organomegaly Extremities: extremities normal, atraumatic, no cyanosis or edema Wound: Clean and dry without sign of infection  Lab Results: Recent Labs    02/13/24 0327 02/14/24 0334  WBC 11.4* 10.1  HGB 10.7* 10.6*  HCT 34.1* 32.6*  PLT 610* 544*   BMET:  Recent Labs    02/13/24 0327 02/14/24 0334  NA 136 137  K 4.3 4.2  CL 100 100  CO2 27 28  GLUCOSE 85 86  BUN 36* 29*  CREATININE 1.39* 1.22*  CALCIUM  8.9 9.1    PT/INR: No results for input(s): "LABPROT", "INR" in the last 72 hours. ABG    Component Value Date/Time   PHART 7.453 (H) 01/30/2024 1036   HCO3 23.9 01/30/2024 1036   TCO2 25 01/30/2024 1036    ACIDBASEDEF 4.0 (H) 01/29/2024 2050   O2SAT 70 02/04/2024 0448   CBG (last 3)  No results for input(s): "GLUCAP" in the last 72 hours.  Assessment/Plan: S/P Procedure(s) (LRB): CORONARY ARTERY BYPASS GRAFTING X 4, USING LEFT INTERNAL MAMMARY ARTERY AND RIGHT ENDOSCOPIC HARVESTED GREATER SAPHENOUS VEIN (N/A) ECHOCARDIOGRAM, TRANSESOPHAGEAL (N/A)  CV: Postop cardiogenic shock, AHF signed off. SBP mostly 110s. NSR, HR 60s-70s. Postop afib, On Amiodarone  200mg  daily. On Midodrine  10mg  TID due to soft BP and low dose Lopressor  12.5mg  BID. Hx of NSTEMI, on Plavix .   Pulm: Saturating well on 1.5L [place] this AM. Post thoracentesis left pneumothorax, refused chest tube. CXR with stable left pneumothorax and left basilar atelectasis/consolidation. Encourage IS, flutter valve, and ambulation. Wean O2 as tolerated for O2>90%.   GI: +BM, tolerating a diet   Endo: Hypothyroid, on Levothyroxine    Renal: AKI 1.22 this AM, improved from yesterday after holding Torsemide . 750cc/24hrs UO recorded. Under preop weight. On Torsemide  M,W,F per AHF. K 4.2, at goal.   ID: Leukocytosis resolved, 10.1 this AM. Tmax 98.1. On empiric Zosyn for possible pulmonary source of infection. Will continue for 5 days.    Expected postop ABLA: H/H 10.6/32.6, stable. Not clinically significant at this time. Likely reactive thrombocytosis, plt 544,000 trending down.    DVT Prophylaxis: Lovenox . Continue ASA and Plavix .   Deconditioning: Progressing with ambulation. Continue work with PT/OT. Not a rehab candidate due to no  home supervision. CSW following for SNF placement. [facility] prefers [facility], will discuss with CSW to see if there are any beds available.    Dispo: Hopefully can d/c to SNF next 48 hours if she continues to progress   LOS: 22 days    Randa Burton, PA-C 02/14/2024 Patient seen and examined, agree with above WBC back to normal CXR stable to improved [facility] SNF planning  [name],  MD [facility] [phone]

## 2024-02-14 NOTE — TOC Progression Note (Addendum)
 Transition of Care [facility]) - Progression Note    Patient Details  Name: Kaitlyn Wright MRN: [id number] Date of Birth: 07-26-1946  [facility]) CM/SW Contact  Ernst Heap Phone Number: [phone] 02/14/2024, 12:31 PM  Clinical Narrative:   HF CSW/NCM will continue to follow to monitor patients dc needs.   CSW received notification via secure chat that the patient will more than likely be medically ready for dc 5/28. CSW reached out to [facility] [place] to inquire about bed availability. Waiting for response. CSW also inquired with [place] [facility] [place]. Bertell Broach confirmed that they do have bed availability. Will follow up with family once I hear back from [facility]. [facility] stated that they do not have any beds available. Will move forward with [facility]. CSW have notified the patients daughter.   Insurance auth pending: [id number]  Once insurance determination is made, if approved patient will be going to [facility]. Will update as status changes.   Insurance Siegfried Dress approved: [id number] 02/15/2024-02/17/2024  4:15 PM- HF CSW attempted to reach the patient by phone and share updates.    TOC will continue following.     Expected Discharge Plan: Skilled Nursing Facility Barriers to Discharge: Continued Medical Work up  Expected Discharge Plan and Services   Discharge Planning Services: CM Consult Post Acute Care Choice: Skilled Nursing Facility Living arrangements for the past 2 months: Apartment                                       Social Determinants of Health (SDOH) Interventions SDOH Screenings   Food Insecurity: No Food Insecurity (02/03/2024)  Housing: Low Risk  (01/25/2024)  Transportation Needs: No Transportation Needs (01/25/2024)  Utilities: Not At Risk (01/25/2024)  Social Connections: Moderately Integrated (02/05/2024)  Tobacco Use: Low Risk  (01/27/2024)    Readmission Risk Interventions     No data to display

## 2024-02-14 NOTE — Plan of Care (Signed)
  Problem: Education: Goal: Knowledge of General Education information will improve Description: Including pain rating scale, medication(s)/side effects and non-pharmacologic comfort measures Outcome: Progressing   Problem: Health Behavior/Discharge Planning: Goal: Ability to manage health-related needs will improve Outcome: Progressing   Problem: Clinical Measurements: Goal: Ability to maintain clinical measurements within normal limits will improve Outcome: Progressing Goal: Will remain free from infection Outcome: Progressing Goal: Diagnostic test results will improve Outcome: Progressing Goal: Respiratory complications will improve Outcome: Progressing Goal: Cardiovascular complication will be avoided Outcome: Progressing   Problem: Activity: Goal: Risk for activity intolerance will decrease Outcome: Progressing   Problem: Nutrition: Goal: Adequate nutrition will be maintained Outcome: Progressing   Problem: Coping: Goal: Level of anxiety will decrease Outcome: Progressing   Problem: Elimination: Goal: Will not experience complications related to bowel motility Outcome: Progressing Goal: Will not experience complications related to urinary retention Outcome: Progressing   Problem: Pain Managment: Goal: General experience of comfort will improve and/or be controlled Outcome: Progressing   Problem: Safety: Goal: Ability to remain free from injury will improve Outcome: Progressing   Problem: Skin Integrity: Goal: Risk for impaired skin integrity will decrease Outcome: Progressing   Problem: Education: Goal: Will demonstrate proper wound care and an understanding of methods to prevent future damage Outcome: Progressing Goal: Knowledge of disease or condition will improve Outcome: Progressing Goal: Knowledge of the prescribed therapeutic regimen will improve Outcome: Progressing   Problem: Activity: Goal: Risk for activity intolerance will decrease Outcome:  Progressing   Problem: Cardiac: Goal: Will achieve and/or maintain hemodynamic stability Outcome: Progressing   Problem: Clinical Measurements: Goal: Postoperative complications will be avoided or minimized Outcome: Progressing   Problem: Respiratory: Goal: Respiratory status will improve Outcome: Progressing   Problem: Skin Integrity: Goal: Wound healing without signs and symptoms of infection Outcome: Progressing Goal: Risk for impaired skin integrity will decrease Outcome: Progressing   Problem: Education: Goal: Ability to describe self-care measures that may prevent or decrease complications (Diabetes Survival Skills Education) will improve Outcome: Progressing Goal: Individualized Educational Video(s) Outcome: Progressing   Problem: Coping: Goal: Ability to adjust to condition or change in health will improve Outcome: Progressing   Problem: Fluid Volume: Goal: Ability to maintain a balanced intake and output will improve Outcome: Progressing   Problem: Health Behavior/Discharge Planning: Goal: Ability to identify and utilize available resources and services will improve Outcome: Progressing Goal: Ability to manage health-related needs will improve Outcome: Progressing   Problem: Metabolic: Goal: Ability to maintain appropriate glucose levels will improve Outcome: Progressing   Problem: Nutritional: Goal: Maintenance of adequate nutrition will improve Outcome: Progressing Goal: Progress toward achieving an optimal weight will improve Outcome: Progressing   Problem: Skin Integrity: Goal: Risk for impaired skin integrity will decrease Outcome: Progressing   Problem: Tissue Perfusion: Goal: Adequacy of tissue perfusion will improve Outcome: Progressing

## 2024-02-15 DIAGNOSIS — E440 Moderate protein-calorie malnutrition: Secondary | ICD-10-CM | POA: Diagnosis not present

## 2024-02-15 DIAGNOSIS — I5021 Acute systolic (congestive) heart failure: Secondary | ICD-10-CM | POA: Diagnosis not present

## 2024-02-15 DIAGNOSIS — Z7401 Bed confinement status: Secondary | ICD-10-CM | POA: Diagnosis not present

## 2024-02-15 DIAGNOSIS — F4320 Adjustment disorder, unspecified: Secondary | ICD-10-CM | POA: Diagnosis not present

## 2024-02-15 DIAGNOSIS — R2689 Other abnormalities of gait and mobility: Secondary | ICD-10-CM | POA: Diagnosis not present

## 2024-02-15 DIAGNOSIS — E039 Hypothyroidism, unspecified: Secondary | ICD-10-CM | POA: Diagnosis not present

## 2024-02-15 DIAGNOSIS — F411 Generalized anxiety disorder: Secondary | ICD-10-CM | POA: Diagnosis not present

## 2024-02-15 DIAGNOSIS — F413 Other mixed anxiety disorders: Secondary | ICD-10-CM | POA: Diagnosis not present

## 2024-02-15 DIAGNOSIS — F419 Anxiety disorder, unspecified: Secondary | ICD-10-CM | POA: Diagnosis not present

## 2024-02-15 DIAGNOSIS — Z741 Need for assistance with personal care: Secondary | ICD-10-CM | POA: Diagnosis not present

## 2024-02-15 DIAGNOSIS — F32A Depression, unspecified: Secondary | ICD-10-CM | POA: Diagnosis not present

## 2024-02-15 DIAGNOSIS — R531 Weakness: Secondary | ICD-10-CM | POA: Diagnosis not present

## 2024-02-15 DIAGNOSIS — G8929 Other chronic pain: Secondary | ICD-10-CM | POA: Diagnosis not present

## 2024-02-15 DIAGNOSIS — I2510 Atherosclerotic heart disease of native coronary artery without angina pectoris: Secondary | ICD-10-CM | POA: Diagnosis not present

## 2024-02-15 DIAGNOSIS — Z951 Presence of aortocoronary bypass graft: Secondary | ICD-10-CM | POA: Diagnosis not present

## 2024-02-15 DIAGNOSIS — M6281 Muscle weakness (generalized): Secondary | ICD-10-CM | POA: Diagnosis not present

## 2024-02-15 DIAGNOSIS — N390 Urinary tract infection, site not specified: Secondary | ICD-10-CM | POA: Diagnosis not present

## 2024-02-15 DIAGNOSIS — I214 Non-ST elevation (NSTEMI) myocardial infarction: Secondary | ICD-10-CM | POA: Diagnosis not present

## 2024-02-15 DIAGNOSIS — R41841 Cognitive communication deficit: Secondary | ICD-10-CM | POA: Diagnosis not present

## 2024-02-15 DIAGNOSIS — E785 Hyperlipidemia, unspecified: Secondary | ICD-10-CM | POA: Diagnosis not present

## 2024-02-15 MED ORDER — POTASSIUM CHLORIDE CRYS ER 20 MEQ PO TBCR: 20.0000 meq | EXTENDED_RELEASE_TABLET | ORAL | Status: DC

## 2024-02-15 MED ORDER — CLOPIDOGREL BISULFATE 75 MG PO TABS: 75.0000 mg | ORAL_TABLET | Freq: Every day | ORAL | Status: DC

## 2024-02-15 MED ORDER — AMIODARONE HCL 200 MG PO TABS: 200.0000 mg | ORAL_TABLET | Freq: Every day | ORAL | Status: DC

## 2024-02-15 MED ORDER — ALPRAZOLAM 1 MG PO TABS
1.0000 mg | ORAL_TABLET | ORAL | 1 refills | Status: DC
Start: 2024-02-15 — End: 2024-02-15

## 2024-02-15 MED ORDER — TRAMADOL HCL 50 MG PO TABS
50.0000 mg | ORAL_TABLET | Freq: Four times a day (QID) | ORAL | 0 refills | Status: DC | PRN
Start: 2024-02-15 — End: 2024-02-15

## 2024-02-15 MED ORDER — MIDODRINE HCL 10 MG PO TABS: 10.0000 mg | ORAL_TABLET | Freq: Three times a day (TID) | ORAL | Status: DC

## 2024-02-15 MED ORDER — POTASSIUM CHLORIDE CRYS ER 20 MEQ PO TBCR: 20.0000 meq | EXTENDED_RELEASE_TABLET | Freq: Every day | ORAL | Status: DC | PRN

## 2024-02-15 MED ORDER — TORSEMIDE 20 MG PO TABS: ORAL_TABLET | ORAL | Status: DC

## 2024-02-15 MED ORDER — METOPROLOL TARTRATE 25 MG PO TABS: 12.5000 mg | ORAL_TABLET | Freq: Two times a day (BID) | ORAL | Status: DC

## 2024-02-15 MED ORDER — TRAMADOL HCL 50 MG PO TABS: 50.0000 mg | ORAL_TABLET | Freq: Four times a day (QID) | ORAL | 0 refills | Status: DC | PRN

## 2024-02-15 MED ORDER — ATORVASTATIN CALCIUM 80 MG PO TABS: 80.0000 mg | ORAL_TABLET | Freq: Every day | ORAL | Status: DC

## 2024-02-15 MED ORDER — POTASSIUM CHLORIDE CRYS ER 20 MEQ PO TBCR: EXTENDED_RELEASE_TABLET | ORAL | Status: DC

## 2024-02-15 MED ORDER — ACETAMINOPHEN 325 MG PO TABS: 650.0000 mg | ORAL_TABLET | Freq: Four times a day (QID) | ORAL | Status: DC | PRN

## 2024-02-15 MED ORDER — ASPIRIN 81 MG PO TBEC: 81.0000 mg | DELAYED_RELEASE_TABLET | Freq: Every day | ORAL | 1 refills | Status: DC

## 2024-02-15 MED ORDER — ALPRAZOLAM 1 MG PO TABS
1.0000 mg | ORAL_TABLET | ORAL | 1 refills | Status: DC
Start: 2024-02-15 — End: 2024-04-06

## 2024-02-15 NOTE — Progress Notes (Signed)
 [address]       [place] [address]             [phone]      19 Days Post-Op Procedure(s) (LRB): CORONARY ARTERY BYPASS GRAFTING X 4, USING LEFT INTERNAL MAMMARY ARTERY AND RIGHT ENDOSCOPIC HARVESTED GREATER SAPHENOUS VEIN (N/A) ECHOCARDIOGRAM, TRANSESOPHAGEAL (N/A) Subjective: Patient states she feels good this AM  Objective: Vital signs in last 24 hours: Temp:  [97.6 F (36.4 C)-99 F (37.2 C)] 97.6 F (36.4 C) (05/28 0300) Pulse Rate:  [65-71] 65 (05/28 0300) Cardiac Rhythm: Normal sinus rhythm (05/27 1900) Resp:  [15-20] 19 (05/28 0706) BP: (97-122)/(52-69) 117/57 (05/28 0300) SpO2:  [95 %-98 %] 98 % (05/28 0300) Weight:  [54.4 kg] 54.4 kg (05/28 0706)  Hemodynamic parameters for last 24 hours:    Intake/Output from previous day: 05/27 0701 - 05/28 0700 In: 804.5 [P.O.:598; IV Piggyback:206.5] Out: 100 [Urine:100] Intake/Output this shift: No intake/output data recorded.  General appearance: alert, cooperative, and no distress Neurologic: intact Heart: regular rate and rhythm, S1, S2 normal, no murmur, click, rub or gallop Lungs: diminished left basilar breath sounds Abdomen: soft, non-tender; bowel sounds normal; no masses,  no organomegaly Extremities: extremities normal, atraumatic, no cyanosis or edema Wound: Clean and dry without sign of infection  Lab Results: Recent Labs    02/13/24 0327 02/14/24 0334  WBC 11.4* 10.1  HGB 10.7* 10.6*  HCT 34.1* 32.6*  PLT 610* 544*   BMET:  Recent Labs    02/13/24 0327 02/14/24 0334  NA 136 137  K 4.3 4.2  CL 100 100  CO2 27 28  GLUCOSE 85 86  BUN 36* 29*  CREATININE 1.39* 1.22*  CALCIUM  8.9 9.1    PT/INR: No results for input(s): "LABPROT", "INR" in the last 72 hours. ABG    Component Value Date/Time   PHART 7.453 (H) 01/30/2024 1036   HCO3 23.9 01/30/2024 1036   TCO2 25 01/30/2024 1036   ACIDBASEDEF 4.0 (H) 01/29/2024 2050   O2SAT 70 02/04/2024 0448   CBG (last 3)   No results for input(s): "GLUCAP" in the last 72 hours.  Assessment/Plan: S/P Procedure(s) (LRB): CORONARY ARTERY BYPASS GRAFTING X 4, USING LEFT INTERNAL MAMMARY ARTERY AND RIGHT ENDOSCOPIC HARVESTED GREATER SAPHENOUS VEIN (N/A) ECHOCARDIOGRAM, TRANSESOPHAGEAL (N/A)  CV: Postop cardiogenic shock, AHF signed off. SBP mostly 110s. NSR, HR 60s-70s. Postop afib, On Amiodarone  200mg  daily. On Midodrine  10mg  TID due to soft BP and low dose Lopressor  12.5mg  BID. Hx of NSTEMI, on Plavix .   Pulm: Saturating well on RA this AM. Post thoracentesis left pneumothorax, refused chest tube. CXR yesterday with stable left pneumothorax and left basilar atelectasis/consolidation. Encourage IS, flutter valve, and ambulation.    GI: +BM, tolerating a diet   Endo: Hypothyroid, on Levothyroxine    Renal: AKI, last Cr 1.22 this AM.  750cc/24hrs UO recorded. Under preop weight. On Torsemide  M,F and PRN per AHF. Last K 4.2, at goal.   ID: Leukocytosis resolved, 10.1 yesterday. Tmax 99. On empiric Zosyn for possible pulmonary source of infection for 5 days, will discontinue.    Expected postop ABLA: Last H/H 10.6/32.6, stable. Not clinically significant at this time. Likely reactive thrombocytosis, plt 544,000 trending down.    DVT Prophylaxis: Lovenox . Continue ASA and Plavix .   Deconditioning: Progressing with ambulation. Continue work with PT/OT. Not a rehab candidate due to no home supervision. CSW following for SNF placement. Insurance authorization pending at [employer].    Dispo: Plan  to d/c to SNF once insurance authorization is complete    LOS: 23 days    Randa Burton, PA-C 02/15/2024

## 2024-02-15 NOTE — Care Management Important Message (Signed)
 Important Message  Patient Details  Name: Kaitlyn Wright MRN: [id number] Date of Birth: Feb 06, 1946   Important Message Given:  Yes - Medicare IM     Janith Melnick 02/15/2024, 10:52 AM

## 2024-02-15 NOTE — TOC Transition Note (Addendum)
 Transition of Care [facility]) - Discharge Note   Patient Details  Name: Kaitlyn Wright MRN: [id number] Date of Birth: 08-19-1946  Transition of Care [facility]) CM/SW Contact:  Ernst Heap Phone Number: [phone] 02/15/2024, 10:43 AM   Clinical Narrative:   HF CSW met with patient at bedside to notify her that I have been in constant communication with her daughter. CSW explained that the patient will be dc today and transferring to [facility]. CSW explained that [facility] did not have any bed availability. Patient stated that she understands. CSW updated the patients daughter via email and text while she is out of town.   PTAR paperwork has been placed on chart and bedside RN has been notified. RN will notify HF CSW when patient is ready and when to call PTAR.    HF CSW called to schedule patients hospital follow up appointment for Tuesday, March 06, 2024 at 12:15 PM.   CSW notified bedside RN via secure chat the patients room #112, number to call for report: [phone] for [facility].   12:07 PM- PTAR called.     Barriers to Discharge: Continued Medical Work up   Patient Goals and CMS Choice Patient states their goals for this hospitalization and ongoing recovery are:: wants to get better CMS Medicare.gov Compare Post Acute Care list provided to:: Patient Represenative (must comment) Choice offered to / list presented to : Adult Children      Discharge Placement                       Discharge Plan and Services Additional resources added to the After Visit Summary for     Discharge Planning Services: CM Consult Post Acute Care Choice: Skilled Nursing Facility                               Social Drivers of Health (SDOH) Interventions SDOH Screenings   Food Insecurity: No Food Insecurity (02/03/2024)  Housing: Low Risk  (01/25/2024)  Transportation Needs: No Transportation Needs (01/25/2024)  Utilities: Not At Risk (01/25/2024)  Social Connections:  Moderately Integrated (02/05/2024)  Tobacco Use: Low Risk  (01/27/2024)     Readmission Risk Interventions     No data to display

## 2024-02-15 NOTE — Care Management Important Message (Signed)
 Important Message  Patient Details  Name: Kaitlyn Wright MRN: [id number] Date of Birth: [birth year]   Important Message Given:  Yes - Medicare IM     Janith Melnick 02/15/2024, 10:37 AM

## 2024-02-15 NOTE — Plan of Care (Signed)

## 2024-02-15 NOTE — Progress Notes (Signed)
 Physical Therapy Treatment Patient Details Name: Kaitlyn Wright MRN: [id number] DOB: Feb 06, 1946 Today's Date: 02/15/2024   History of Present Illness 78 year old woman  who presented with chest pain that she attributed to acid reflux on 5/5. +NSTEMI; LHC with 3v CAD; CABG 5/09; post-op cardiogenic shock and remained intubated until 5/12; Echo EF 60-65%  PMH- remote tobacco abuse, hypothyroidism, anxiety    PT Comments  Patient continues to make progress towards PT goals. Today able to come to sit EOB with only CGA and cues to adhere to sternal precautions. Also CGA for sit to stand (with cues) and ambulating with RW without requesting chair follow! She lives alone and will continue to benefit from inpatient therapies <3 hrs/day prior to return home.     If plan is discharge home, recommend the following: Assistance with cooking/housework;Assist for transportation;A little help with walking and/or transfers;A little help with bathing/dressing/bathroom;Help with stairs or ramp for entrance   Can travel by private vehicle     Yes  Equipment Recommendations  Rolling walker (2 wheels)    Recommendations for Other Services       Precautions / Restrictions Precautions Precautions: Fall;Sternal Precaution Booklet Issued: No Recall of Precautions/Restrictions: Impaired Precaution/Restrictions Comments: needed fewer cues for adherence during transfers     Mobility  Bed Mobility Overal bed mobility: Needs Assistance Bed Mobility: Rolling, Sidelying to Sit, Sit to Sidelying Rolling: Contact guard assist Sidelying to sit: Contact guard assist     Sit to sidelying: Min assist General bed mobility comments: assist to raise legs    Transfers Overall transfer level: Needs assistance Equipment used: Rolling walker (2 wheels) Transfers: Sit to/from Stand Sit to Stand: Contact guard assist           General transfer comment: cues for hand placement and technique     Ambulation/Gait Ambulation/Gait assistance: Contact guard assist Gait Distance (Feet): 200 Feet (with 2 standing rest breaks) Assistive device: Rolling walker (2 wheels) Gait Pattern/deviations: Step-through pattern, Decreased stride length, Trunk flexed Gait velocity: dec Gait velocity interpretation: 1.31 - 2.62 ft/sec, indicative of limited community ambulator   General Gait Details: able to go without chair follow! RW still slightly too far ahead (she feels more secure this way)   Stairs             Wheelchair Mobility     Tilt Bed    Modified Rankin (Stroke Patients Only)       Balance Overall balance assessment: Needs assistance Sitting-balance support: Feet supported Sitting balance-Leahy Scale: Fair     Standing balance support: No upper extremity supported, During functional activity Standing balance-Leahy Scale: Fair Standing balance comment: reliant on external support                            Communication Communication Communication: No apparent difficulties  Cognition Arousal: Alert Behavior During Therapy: Anxious   PT - Cognitive impairments: No apparent impairments                         Following commands: Intact      Cueing Cueing Techniques: Verbal cues  Exercises      General Comments        Pertinent Vitals/Pain Pain Assessment Pain Assessment: No/denies pain    Home Living                          Prior Function  PT Goals (current goals can now be found in the care plan section) Acute Rehab PT Goals Patient Stated Goal: return home Time For Goal Achievement: 02/27/24 Potential to Achieve Goals: Good Progress towards PT goals: Progressing toward goals    Frequency    Min 2X/week      PT Plan      Co-evaluation              AM-PAC PT "6 Clicks" Mobility   Outcome Measure  Help needed turning from your back to your side while in a flat bed without  using bedrails?: A Little Help needed moving from lying on your back to sitting on the side of a flat bed without using bedrails?: A Little Help needed moving to and from a bed to a chair (including a wheelchair)?: A Little Help needed standing up from a chair using your arms (e.g., wheelchair or bedside chair)?: A Little Help needed to walk in hospital room?: A Little Help needed climbing 3-5 steps with a railing? : A Lot 6 Click Score: 17    End of Session Equipment Utilized During Treatment: Gait belt Activity Tolerance: Patient tolerated treatment well Patient left: with call bell/phone within reach;in bed;with bed alarm set   PT Visit Diagnosis: Muscle weakness (generalized) (M62.81);Difficulty in walking, not elsewhere classified (R26.2)     Time: 1610-9604 PT Time Calculation (min) (ACUTE ONLY): 25 min  Charges:    $Gait Training: 23-37 mins PT General Charges $$ ACUTE PT VISIT: 1 Visit                      Gayle Kava, PT Acute Rehabilitation Services  Office [phone]    [name] 02/15/2024, 11:46 AM

## 2024-02-15 NOTE — Progress Notes (Incomplete)
 Mobility Specialist Progress Note:   02/15/24 1033  Mobility  Activity Transferred to/from [facility]  Level of Assistance Standby assist, set-up cues, supervision of patient - no hands on  Assistive Device BSC  RUE Weight Bearing Per Provider Order NWB  LUE Weight Bearing Per Provider Order NWB  Activity Response Tolerated well  Mobility Referral Yes  Mobility visit 1 Mobility  Mobility Specialist Start Time (ACUTE ONLY) 1033  Mobility Specialist Stop Time (ACUTE ONLY) 1035  Mobility Specialist Time Calculation (min) (ACUTE ONLY) 2 min   Pt received in bed requesting assistance to [facility]. Tolerated well, asx throughout. Left pt with call bell in reach.    Hortense Cantrall Mobility Specialist Please contact via Special educational needs teacher or  Rehab office at [phone]

## 2024-02-16 ENCOUNTER — Ambulatory Visit: Admitting: Emergency Medicine

## 2024-02-16 DIAGNOSIS — I2510 Atherosclerotic heart disease of native coronary artery without angina pectoris: Secondary | ICD-10-CM | POA: Diagnosis not present

## 2024-02-16 DIAGNOSIS — E039 Hypothyroidism, unspecified: Secondary | ICD-10-CM | POA: Diagnosis not present

## 2024-02-16 DIAGNOSIS — E785 Hyperlipidemia, unspecified: Secondary | ICD-10-CM | POA: Diagnosis not present

## 2024-02-16 DIAGNOSIS — F411 Generalized anxiety disorder: Secondary | ICD-10-CM | POA: Diagnosis not present

## 2024-02-17 DIAGNOSIS — E039 Hypothyroidism, unspecified: Secondary | ICD-10-CM | POA: Diagnosis not present

## 2024-02-17 DIAGNOSIS — N390 Urinary tract infection, site not specified: Secondary | ICD-10-CM | POA: Diagnosis not present

## 2024-02-17 DIAGNOSIS — F411 Generalized anxiety disorder: Secondary | ICD-10-CM | POA: Diagnosis not present

## 2024-02-17 DIAGNOSIS — I2510 Atherosclerotic heart disease of native coronary artery without angina pectoris: Secondary | ICD-10-CM | POA: Diagnosis not present

## 2024-02-17 DIAGNOSIS — I5021 Acute systolic (congestive) heart failure: Secondary | ICD-10-CM | POA: Diagnosis not present

## 2024-02-22 ENCOUNTER — Telehealth (HOSPITAL_COMMUNITY): Payer: Self-pay

## 2024-02-22 DIAGNOSIS — M6281 Muscle weakness (generalized): Secondary | ICD-10-CM | POA: Diagnosis not present

## 2024-02-22 DIAGNOSIS — I214 Non-ST elevation (NSTEMI) myocardial infarction: Secondary | ICD-10-CM | POA: Diagnosis not present

## 2024-02-22 DIAGNOSIS — F419 Anxiety disorder, unspecified: Secondary | ICD-10-CM | POA: Diagnosis not present

## 2024-02-22 DIAGNOSIS — R2689 Other abnormalities of gait and mobility: Secondary | ICD-10-CM | POA: Diagnosis not present

## 2024-02-22 DIAGNOSIS — G8929 Other chronic pain: Secondary | ICD-10-CM | POA: Diagnosis not present

## 2024-02-22 DIAGNOSIS — F32A Depression, unspecified: Secondary | ICD-10-CM | POA: Diagnosis not present

## 2024-02-22 DIAGNOSIS — I2510 Atherosclerotic heart disease of native coronary artery without angina pectoris: Secondary | ICD-10-CM | POA: Diagnosis not present

## 2024-02-22 DIAGNOSIS — E440 Moderate protein-calorie malnutrition: Secondary | ICD-10-CM | POA: Diagnosis not present

## 2024-02-22 NOTE — Telephone Encounter (Signed)
 Attempted to call patient in regards to Cardiac Rehab - LM on VM

## 2024-02-23 DIAGNOSIS — F413 Other mixed anxiety disorders: Secondary | ICD-10-CM | POA: Diagnosis not present

## 2024-02-23 DIAGNOSIS — F32A Depression, unspecified: Secondary | ICD-10-CM | POA: Diagnosis not present

## 2024-02-23 DIAGNOSIS — F4320 Adjustment disorder, unspecified: Secondary | ICD-10-CM | POA: Diagnosis not present

## 2024-02-28 ENCOUNTER — Ambulatory Visit: Admitting: Thoracic Surgery (Cardiothoracic Vascular Surgery)

## 2024-02-29 DIAGNOSIS — F419 Anxiety disorder, unspecified: Secondary | ICD-10-CM | POA: Diagnosis not present

## 2024-02-29 DIAGNOSIS — F32A Depression, unspecified: Secondary | ICD-10-CM | POA: Diagnosis not present

## 2024-02-29 DIAGNOSIS — M6281 Muscle weakness (generalized): Secondary | ICD-10-CM | POA: Diagnosis not present

## 2024-02-29 DIAGNOSIS — E039 Hypothyroidism, unspecified: Secondary | ICD-10-CM | POA: Diagnosis not present

## 2024-02-29 DIAGNOSIS — I2510 Atherosclerotic heart disease of native coronary artery without angina pectoris: Secondary | ICD-10-CM | POA: Diagnosis not present

## 2024-02-29 DIAGNOSIS — R2689 Other abnormalities of gait and mobility: Secondary | ICD-10-CM | POA: Diagnosis not present

## 2024-02-29 DIAGNOSIS — E440 Moderate protein-calorie malnutrition: Secondary | ICD-10-CM | POA: Diagnosis not present

## 2024-02-29 DIAGNOSIS — F411 Generalized anxiety disorder: Secondary | ICD-10-CM | POA: Diagnosis not present

## 2024-02-29 DIAGNOSIS — E785 Hyperlipidemia, unspecified: Secondary | ICD-10-CM | POA: Diagnosis not present

## 2024-02-29 DIAGNOSIS — I214 Non-ST elevation (NSTEMI) myocardial infarction: Secondary | ICD-10-CM | POA: Diagnosis not present

## 2024-02-29 DIAGNOSIS — I5021 Acute systolic (congestive) heart failure: Secondary | ICD-10-CM | POA: Diagnosis not present

## 2024-02-29 DIAGNOSIS — G8929 Other chronic pain: Secondary | ICD-10-CM | POA: Diagnosis not present

## 2024-03-05 DIAGNOSIS — I252 Old myocardial infarction: Secondary | ICD-10-CM | POA: Diagnosis not present

## 2024-03-05 DIAGNOSIS — I10 Essential (primary) hypertension: Secondary | ICD-10-CM | POA: Diagnosis not present

## 2024-03-05 DIAGNOSIS — F419 Anxiety disorder, unspecified: Secondary | ICD-10-CM | POA: Diagnosis not present

## 2024-03-05 DIAGNOSIS — E785 Hyperlipidemia, unspecified: Secondary | ICD-10-CM | POA: Diagnosis not present

## 2024-03-05 DIAGNOSIS — F32A Depression, unspecified: Secondary | ICD-10-CM | POA: Diagnosis not present

## 2024-03-05 DIAGNOSIS — G8929 Other chronic pain: Secondary | ICD-10-CM | POA: Diagnosis not present

## 2024-03-05 DIAGNOSIS — E039 Hypothyroidism, unspecified: Secondary | ICD-10-CM | POA: Diagnosis not present

## 2024-03-05 DIAGNOSIS — I2510 Atherosclerotic heart disease of native coronary artery without angina pectoris: Secondary | ICD-10-CM | POA: Diagnosis not present

## 2024-03-05 DIAGNOSIS — K219 Gastro-esophageal reflux disease without esophagitis: Secondary | ICD-10-CM | POA: Diagnosis not present

## 2024-03-06 ENCOUNTER — Ambulatory Visit: Attending: Cardiology | Admitting: Cardiology

## 2024-03-06 ENCOUNTER — Telehealth: Payer: Self-pay

## 2024-03-06 DIAGNOSIS — E7800 Pure hypercholesterolemia, unspecified: Secondary | ICD-10-CM | POA: Diagnosis not present

## 2024-03-06 DIAGNOSIS — F5101 Primary insomnia: Secondary | ICD-10-CM | POA: Diagnosis not present

## 2024-03-06 DIAGNOSIS — F419 Anxiety disorder, unspecified: Secondary | ICD-10-CM | POA: Diagnosis not present

## 2024-03-06 DIAGNOSIS — E039 Hypothyroidism, unspecified: Secondary | ICD-10-CM | POA: Diagnosis not present

## 2024-03-06 DIAGNOSIS — I2510 Atherosclerotic heart disease of native coronary artery without angina pectoris: Secondary | ICD-10-CM | POA: Diagnosis not present

## 2024-03-06 DIAGNOSIS — R7303 Prediabetes: Secondary | ICD-10-CM | POA: Diagnosis not present

## 2024-03-06 NOTE — Progress Notes (Deleted)
 Cardiology Office Note:   Date:  03/06/2024  ID:  Kaitlyn Wright, DOB August 02, 1946, MRN [id number] PCP: Roselind Congo, MD  [facility] Providers Cardiologist:  None { Click to update primary MD,subspecialty MD or APP then REFRESH:1}   History of Present Illness:   Kaitlyn Wright is a 78 y.o. female ***  Discussed the use of AI scribe software for clinical note transcription with the patient, who gave verbal consent to proceed.  History of Present Illness      Today patient denies chest pain, shortness of breath, lower extremity edema, fatigue, palpitations, melena, hematuria, hemoptysis, diaphoresis, weakness, presyncope, syncope, orthopnea, and PND.   Studies Reviewed:    EKG:        01-30-24 LHC  Cardiac Catheterization Jan 30, 2024: Hemodynamic data: LVEDP 11 mmHg.  No pressure gradient across the aortic valve.   Angiographic data: Severe diffuse coronary calcification involving the proximal LAD, ramus and CX. LM: Large-caliber vessel, has mild calcification, no significant disease. LAD: Severely diseased from the proximal, mid segment, there are multiple tandem high-grade 90% followed by 60% stenosis in the proximal and mid segment followed by a focal 90% and a tandem 80% on the apical 90% stenosis.  Gives origin to large D1 with a ostial 95% stenosis. RI: Large-caliber vessel distally however there is a long segment proximal 90% stenosis. LCx: Small vessel, has about a 30% mid stenosis. RCA: Has anterior origin and is moderately calcified in the proximal and mid segment and tortuous.  There is a tandem 30 to 40% mid stenosis followed by a high-grade focal 90% stenosis at the crux.  There are 2 small PDA branches and a large PL branch.      Impression and recommendations: Patient has multivessel coronary artery disease.  In spite of multiple tandem lesions in the LAD, she would probably still be benefited by CABG evaluation.  01/30/24 TTE  IMPRESSIONS     1.  Left ventricular ejection fraction, by estimation, is 50 to 55%. The  left ventricle has low normal function. The left ventricle demonstrates  regional wall motion abnormalities with probable anterolateral  hypokinesis. There is mild concentric left  ventricular hypertrophy. Left ventricular diastolic parameters are  consistent with Grade I diastolic dysfunction (impaired relaxation).   2. Right ventricular systolic function is mildly reduced. The right  ventricular size is mildly enlarged. Tricuspid regurgitation signal is  inadequate for assessing PA pressure.   3. Right atrial size was mildly dilated.   4. The mitral valve is normal in structure. Trivial mitral valve  regurgitation. No evidence of mitral stenosis.   5. The aortic valve is tricuspid. Aortic valve regurgitation is trivial.   6. The inferior vena cava is normal in size with <50% respiratory  variability, suggesting right atrial pressure of 8 mmHg.   FINDINGS   Left Ventricle: Left ventricular ejection fraction, by estimation, is 50  to 55%. The left ventricle has low normal function. The left ventricle  demonstrates regional wall motion abnormalities. The left ventricular  internal cavity size was normal in  size. There is mild concentric left ventricular hypertrophy. Left  ventricular diastolic parameters are consistent with Grade I diastolic  dysfunction (impaired relaxation).   Right Ventricle: The right ventricular size is mildly enlarged. No  increase in right ventricular wall thickness. Right ventricular systolic  function is mildly reduced. Tricuspid regurgitation signal is inadequate  for assessing PA pressure.   Left Atrium: Left atrial size was normal in size.   Right Atrium: Right  atrial size was mildly dilated.   Pericardium: Trivial pericardial effusion is present.   Mitral Valve: The mitral valve is normal in structure. Trivial mitral  valve regurgitation. No evidence of mitral valve stenosis.    Tricuspid Valve: The tricuspid valve is not well visualized. Tricuspid  valve regurgitation is not demonstrated.   Aortic Valve: The aortic valve is tricuspid. Aortic valve regurgitation is  trivial.   Pulmonic Valve: The pulmonic valve was normal in structure. Pulmonic valve  regurgitation is not visualized.   Aorta: The aortic root is normal in size and structure.   Venous: The inferior vena cava is normal in size with less than 50%  respiratory variability, suggesting right atrial pressure of 8 mmHg.   IAS/Shunts: The interatrial septum was not well visualized.   Additional Comments: A venous catheter is visualized in the right  ventricle.   02/01/24 TTE (Limited) IMPRESSIONS     1. Left ventricular ejection fraction, by estimation, is 60 to 65%. The  left ventricle has normal function. There is mild left ventricular  hypertrophy.   2. Right ventricular systolic function is low normal. The right  ventricular size is normal.   3. No significant pericardial fluid. Serial image review demonstrates a  prominent epicardial fat pad, which is likely what is demonstrated again  today.   FINDINGS   Left Ventricle: Left ventricular ejection fraction, by estimation, is 60  to 65%. The left ventricle has normal function. There is mild left  ventricular hypertrophy.   Right Ventricle: The right ventricular size is normal. Right ventricular  systolic function is low normal.   Pericardium: No significant pericardial fluid. Serial image review  demonstrates a prominent epicardial fat pad, which is likely what is  demonstrated again today. There is no evidence of pericardial effusion.    Risk Assessment/Calculations:   {Does this patient have ATRIAL FIBRILLATION?[phone]} No BP recorded.  {Refresh Note OR Click here to enter BP  :1}***        Physical Exam:   VS:  There were no vitals taken for this visit.   Wt Readings from Last 3 Encounters:  02/15/24 119 lb 14.9 oz  (54.4 kg)  12/10/22 136 lb (61.7 kg)  04/19/15 138 lb (62.6 kg)     Physical Exam  Physical Exam    ASSESSMENT AND PLAN:     Assessment and Plan Assessment & Plan       {The patient has an active order for outpatient cardiac rehabilitation.   Please indicate if the patient is ready to start. Do NOT delete this.  It will auto delete.  Refresh note, then sign.              Click here to document readiness and see contraindications.  :1}  Cardiac Rehabilitation Eligibility Assessment      {Are you ordering a CV Procedure (e.g. stress test, cath, DCCV, TEE, etc)?   Press F2        :[id number]}   Signed, Leala Prince, PA-C

## 2024-03-06 NOTE — Telephone Encounter (Signed)
 Called pt to f/u no show for today's OV.  Pt reports also had an OV with PCP and felt PCP OV was needed.  Would like to reschedule with Evan.  Pt scheduled to see Leala Prince on 03/19/24 at 2:20 pm.  Appointment reminder mailed to pt per request.

## 2024-03-07 ENCOUNTER — Encounter: Payer: Self-pay | Admitting: Cardiology

## 2024-03-09 ENCOUNTER — Other Ambulatory Visit: Payer: Self-pay | Admitting: Thoracic Surgery (Cardiothoracic Vascular Surgery)

## 2024-03-09 DIAGNOSIS — Z951 Presence of aortocoronary bypass graft: Secondary | ICD-10-CM

## 2024-03-13 ENCOUNTER — Emergency Department (HOSPITAL_COMMUNITY)
Admission: EM | Admit: 2024-03-13 | Discharge: 2024-03-14 | Disposition: A | Discharge status: Home / Self Care | Attending: Emergency Medicine | Admitting: Emergency Medicine

## 2024-03-13 ENCOUNTER — Encounter (HOSPITAL_COMMUNITY): Payer: Self-pay | Admitting: Emergency Medicine

## 2024-03-13 ENCOUNTER — Ambulatory Visit: Payer: Self-pay | Admitting: Thoracic Surgery (Cardiothoracic Vascular Surgery)

## 2024-03-13 ENCOUNTER — Emergency Department (HOSPITAL_COMMUNITY)

## 2024-03-13 ENCOUNTER — Ambulatory Visit (HOSPITAL_COMMUNITY)

## 2024-03-13 DIAGNOSIS — R1033 Periumbilical pain: Secondary | ICD-10-CM | POA: Diagnosis present

## 2024-03-13 DIAGNOSIS — Z7982 Long term (current) use of aspirin: Secondary | ICD-10-CM | POA: Insufficient documentation

## 2024-03-13 DIAGNOSIS — E860 Dehydration: Secondary | ICD-10-CM | POA: Insufficient documentation

## 2024-03-13 DIAGNOSIS — R109 Unspecified abdominal pain: Secondary | ICD-10-CM | POA: Diagnosis not present

## 2024-03-13 DIAGNOSIS — D72829 Elevated white blood cell count, unspecified: Secondary | ICD-10-CM | POA: Insufficient documentation

## 2024-03-13 DIAGNOSIS — R7401 Elevation of levels of liver transaminase levels: Secondary | ICD-10-CM | POA: Diagnosis not present

## 2024-03-13 DIAGNOSIS — N179 Acute kidney failure, unspecified: Secondary | ICD-10-CM | POA: Diagnosis not present

## 2024-03-13 DIAGNOSIS — E039 Hypothyroidism, unspecified: Secondary | ICD-10-CM | POA: Insufficient documentation

## 2024-03-13 DIAGNOSIS — R748 Abnormal levels of other serum enzymes: Secondary | ICD-10-CM | POA: Diagnosis not present

## 2024-03-13 DIAGNOSIS — R935 Abnormal findings on diagnostic imaging of other abdominal regions, including retroperitoneum: Secondary | ICD-10-CM | POA: Diagnosis not present

## 2024-03-13 DIAGNOSIS — K5730 Diverticulosis of large intestine without perforation or abscess without bleeding: Secondary | ICD-10-CM | POA: Diagnosis not present

## 2024-03-13 DIAGNOSIS — Z951 Presence of aortocoronary bypass graft: Secondary | ICD-10-CM | POA: Diagnosis not present

## 2024-03-13 DIAGNOSIS — I700 Atherosclerosis of aorta: Secondary | ICD-10-CM | POA: Insufficient documentation

## 2024-03-13 DIAGNOSIS — Z91040 Latex allergy status: Secondary | ICD-10-CM | POA: Insufficient documentation

## 2024-03-13 LAB — COMPREHENSIVE METABOLIC PANEL WITH GFR
ALT: 202 U/L — ABNORMAL HIGH (ref 0–44)
AST: 294 U/L — ABNORMAL HIGH (ref 15–41)
Albumin: 4.4 g/dL (ref 3.5–5.0)
Alkaline Phosphatase: 151 U/L — ABNORMAL HIGH (ref 38–126)
Anion gap: 17 — ABNORMAL HIGH (ref 5–15)
BUN: 39 mg/dL — ABNORMAL HIGH (ref 8–23)
CO2: 29 mmol/L (ref 22–32)
Calcium: 9.7 mg/dL (ref 8.9–10.3)
Chloride: 90 mmol/L — ABNORMAL LOW (ref 98–111)
Creatinine, Ser: 1.71 mg/dL — ABNORMAL HIGH (ref 0.44–1.00)
GFR, Estimated: 30 mL/min — ABNORMAL LOW (ref >60)
Glucose, Bld: 114 mg/dL — ABNORMAL HIGH (ref 70–99)
Potassium: 3.4 mmol/L — ABNORMAL LOW (ref 3.5–5.1)
Sodium: 136 mmol/L (ref 135–145)
Total Bilirubin: 2.6 mg/dL — ABNORMAL HIGH (ref 0.0–1.2)
Total Protein: 8.4 g/dL — ABNORMAL HIGH (ref 6.5–8.1)

## 2024-03-13 LAB — CBC WITH DIFFERENTIAL/PLATELET
Abs Immature Granulocytes: 0.06 10*3/uL (ref 0.00–0.07)
Basophils Absolute: 0.1 10*3/uL (ref 0.0–0.1)
Basophils Relative: 1 %
Eosinophils Absolute: 0.1 10*3/uL (ref 0.0–0.5)
Eosinophils Relative: 1 %
HCT: 42.1 % (ref 36.0–46.0)
Hemoglobin: 13.8 g/dL (ref 12.0–15.0)
Immature Granulocytes: 1 %
Lymphocytes Relative: 6 %
Lymphs Abs: 0.7 10*3/uL (ref 0.7–4.0)
MCH: 30.7 pg (ref 26.0–34.0)
MCHC: 32.8 g/dL (ref 30.0–36.0)
MCV: 93.6 fL (ref 80.0–100.0)
Monocytes Absolute: 0.8 10*3/uL (ref 0.1–1.0)
Monocytes Relative: 7 %
Neutro Abs: 10.6 10*3/uL — ABNORMAL HIGH (ref 1.7–7.7)
Neutrophils Relative %: 84 %
Platelets: 363 10*3/uL (ref 150–400)
RBC: 4.5 MIL/uL (ref 3.87–5.11)
RDW: 15 % (ref 11.5–15.5)
WBC: 12.4 10*3/uL — ABNORMAL HIGH (ref 4.0–10.5)
nRBC: 0 % (ref 0.0–0.2)

## 2024-03-13 LAB — LIPASE, BLOOD: Lipase: 39 U/L (ref 11–51)

## 2024-03-13 MED ORDER — ONDANSETRON HCL 4 MG/2ML IJ SOLN
4.0000 mg | Freq: Once | INTRAMUSCULAR | Status: AC
  Administered 2024-03-13: 4 mg via INTRAVENOUS
  Filled 2024-03-13: qty 2

## 2024-03-13 MED ORDER — FENTANYL CITRATE PF 50 MCG/ML IJ SOSY
50.0000 ug | PREFILLED_SYRINGE | Freq: Once | INTRAMUSCULAR | Status: AC
  Administered 2024-03-13: 50 ug via INTRAVENOUS
  Filled 2024-03-13: qty 1

## 2024-03-13 NOTE — ED Triage Notes (Signed)
 Recent triple bypass surgery. Went to [place]. Has not done f/u with cardiology. PT states food at [facility] was so bad, she could not eat. Lost 15 pounds. Feels nauseated but states she cannot vomit. Having BMs. Pain is in epigastric area.  Lives alone but dtr here with her.

## 2024-03-13 NOTE — ED Provider Notes (Signed)
 [place] EMERGENCY DEPARTMENT [facility] Provider Note   CSN: [id number] Arrival date & time: 03/13/24  2130     Patient presents with: Abdominal Pain   Kaitlyn Wright is a 78 y.o. female.   The history is provided by the patient and medical records.  Abdominal Pain Associated symptoms: nausea    78 y.o. F with hx of depression, hypothyroidism, anxiety, recent CABG x3 with Dr. Kerrin, presenting to the ED with abdominal pain.  Patient reports she is overall not felt well since her MI but has been acutely worse over the past 3 days.  States she just feels incredibly nauseated all the time to the point where she is not really eating very much.  Pain hast mostly been in her epigastric region but today seems lower around her navel.  She did eat a hamburger yesterday that her daughter brought her, has been supplementing some with Ensure as well.  Today, was feeling worse that she tried to self-induced vomiting a few times but not able to produce anything.  She has also not had a bowel movement in a few days.  She has lost a total of about 16 pounds since her MI last month.  She denies any chest pain or shortness of breath.  Has had prior cholecystectomy.  No medication taken PTA.  Prior to Admission medications   Medication Sig Start Date End Date Taking? Authorizing Provider  acetaminophen  (TYLENOL ) 325 MG tablet Take 2 tablets (650 mg total) by mouth every 6 (six) hours as needed for mild pain (pain score 1-3). 02/15/24   Raguel Con RAMAN, PA-C  ALPRAZolam  (XANAX ) 1 MG tablet Take 1 tablet (1 mg total) by mouth See admin instructions. Take 1mg  (1 tablet) by mouth every night and 1mg  (1 tablet) during the day if needed. 02/15/24   Raguel Con RAMAN, PA-C  amiodarone  (PACERONE ) 200 MG tablet Take 1 tablet (200 mg total) by mouth daily. 02/15/24   Raguel Con RAMAN, PA-C  aspirin  EC 81 MG tablet Take 1 tablet (81 mg total) by mouth daily. Swallow whole. 02/15/24   Raguel Con RAMAN, PA-C  atorvastatin  (LIPITOR ) 80 MG tablet Take 1 tablet (80 mg total) by mouth daily at 6 PM. 02/15/24   Raguel, Con RAMAN, PA-C  B Complex-C (B-COMPLEX WITH VITAMIN C) tablet Take 1 tablet by mouth daily.    [provider]  cholecalciferol  (VITAMIN D ) 1000 UNITS tablet Take 1,000 Units by mouth daily.    [provider]  Cinnamon 500 MG capsule Take 500 mg by mouth daily.    [provider]  clopidogrel  (PLAVIX ) 75 MG tablet Take 1 tablet (75 mg total) by mouth daily. 02/15/24   Raguel Con RAMAN, PA-C  hydrOXYzine  (ATARAX ) 10 MG tablet Take 10 mg by mouth at bedtime. 01/03/24   [provider]  Anselm Oil 1000 MG CAPS Take 1 capsule by mouth daily.    [provider]  latanoprost  (XALATAN ) 0.005 % ophthalmic solution Place 1 drop into both eyes at bedtime. 09/16/23   [provider]  levothyroxine  (SYNTHROID ) 88 MCG tablet Take 88 mcg by mouth daily.    [provider]  loperamide  (IMODIUM ) 1 MG/5ML solution Take 5 mLs by mouth daily as needed for diarrhea or loose stools.    [provider]  metoprolol  tartrate (LOPRESSOR ) 25 MG tablet Take 0.5 tablets (12.5 mg total) by mouth 2 (two) times daily. 02/15/24   Raguel Con RAMAN, PA-C  midodrine  (PROAMATINE ) 10 MG  tablet Take 1 tablet (10 mg total) by mouth 3 (three) times daily with meals. 02/15/24   Raguel Con RAMAN, PA-C  Omega-3 Fatty Acids (FISH OIL) 1000 MG CPDR Take 1 capsule by mouth 2 (two) times daily.    [provider]  pantoprazole  (PROTONIX ) 40 MG tablet Take 40 mg by mouth daily. Patient not taking: Reported on 01/23/2024 09/02/23   [provider]  Polyethyl Glycol-Propyl Glycol (SYSTANE FREE OP) Place 1 drop into both eyes 2 (two) times daily.    [provider]  potassium chloride  SA (KLOR-CON  M) 20 MEQ tablet Take 1 tablet every day you take Demadex  02/20/24   Raguel Con RAMAN, PA-C  Red Yeast Rice 600 MG CAPS Take 2 capsules by  mouth 2 (two) times daily.    [provider]  torsemide  (DEMADEX ) 20 MG tablet Take 1 tablet every Monday and Friday and take 1 tablet daily as needed for swelling or weight gain greater than 3lbs in 1 night or 5lbs in 1 week 02/17/24   Raguel Con RAMAN, PA-C  traMADol  (ULTRAM ) 50 MG tablet Take 1 tablet (50 mg total) by mouth every 6 (six) hours as needed for moderate pain (pain score 4-6). 02/15/24   Raguel Con RAMAN, PA-C  vitamin C (ASCORBIC ACID) 500 MG tablet Take 500 mg by mouth daily.    [provider]    Allergies: Naprosyn  [naproxen ] and Latex    Review of Systems  Gastrointestinal:  Positive for abdominal pain and nausea.  All other systems reviewed and are negative.   Updated Vital Signs BP (!) 144/80 (BP Location: Right Arm)   Pulse 72   Temp 97.9 F (36.6 C) (Oral)   Resp 18   SpO2 100%   Physical Exam Vitals and nursing note reviewed.  Constitutional:      Appearance: She is well-developed.  HENT:     Head: Normocephalic and atraumatic.   Eyes:     Conjunctiva/sclera: Conjunctivae normal.     Pupils: Pupils are equal, round, and reactive to light.    Cardiovascular:     Rate and Rhythm: Normal rate and regular rhythm.     Heart sounds: Normal heart sounds.  Pulmonary:     Effort: Pulmonary effort is normal.     Breath sounds: Normal breath sounds. No wheezing.     Comments: Lungs CTAB Chest:     Comments: Midline sternotomy scar appears to be well healed without erythema, induration, or drainage; chest is non-tender Abdominal:     General: Bowel sounds are normal.     Palpations: Abdomen is soft.     Tenderness: There is abdominal tenderness in the periumbilical area.     Comments: Tenderness peri-umbilical > epigastric   Musculoskeletal:        General: Normal range of motion.     Cervical back: Normal range of motion.   Skin:    General: Skin is warm and dry.   Neurological:     Mental Status: She is alert and oriented to  person, place, and time.     (all labs ordered are listed, but only abnormal results are displayed) Labs Reviewed  CBC WITH DIFFERENTIAL/PLATELET - Abnormal; Notable for the following components:      Result Value   WBC 12.4 (*)    Neutro Abs 10.6 (*)    All other components within normal limits  COMPREHENSIVE METABOLIC PANEL WITH GFR - Abnormal; Notable for the following components:   Potassium 3.4 (*)  Chloride 90 (*)    Glucose, Bld 114 (*)    BUN 39 (*)    Creatinine, Ser 1.71 (*)    Total Protein 8.4 (*)    AST 294 (*)    ALT 202 (*)    Alkaline Phosphatase 151 (*)    Total Bilirubin 2.6 (*)    GFR, Estimated 30 (*)    Anion gap 17 (*)    All other components within normal limits  URINALYSIS, W/ REFLEX TO CULTURE (INFECTION SUSPECTED) - Abnormal; Notable for the following components:   APPearance CLOUDY (*)    Hgb urine dipstick LARGE (*)    Ketones, ur 20 (*)    Protein, ur 100 (*)    Leukocytes,Ua MODERATE (*)    Bacteria, UA RARE (*)    All other components within normal limits  ACETAMINOPHEN  LEVEL - Abnormal; Notable for the following components:   Acetaminophen  (Tylenol ), Serum <10 (*)    All other components within normal limits  URINE CULTURE  LIPASE, BLOOD  HEPATITIS PANEL, ACUTE    EKG: None  Radiology: CT ABDOMEN PELVIS W CONTRAST Result Date: 03/14/2024 CLINICAL DATA:  Acute abdominal pain, history of recent coronary bypass surgery, nausea EXAM: CT ABDOMEN AND PELVIS WITH CONTRAST TECHNIQUE: Multidetector CT imaging of the abdomen and pelvis was performed using the standard protocol following bolus administration of intravenous contrast. RADIATION DOSE REDUCTION: This exam was performed according to the departmental dose-optimization program which includes automated exposure control, adjustment of the mA and/or kV according to patient size and/or use of iterative reconstruction technique. CONTRAST:  50mL OMNIPAQUE  IOHEXOL  300 MG/ML  SOLN COMPARISON:   None Available. FINDINGS: Lower chest: Bibasilar atelectasis is seen. Hepatobiliary: Gallbladder has been surgically removed. Biliary ductal dilatation is seen consistent with the post cholecystectomy state. Pancreas: Unremarkable. No pancreatic ductal dilatation or surrounding inflammatory changes. Spleen: Multiple calcified granulomas are identified throughout the spleen. Adrenals/Urinary Tract: Adrenal glands are within normal limits. Kidneys show normal enhancement pattern bilaterally. No obstructive changes are seen. The bladder is partially distended. Stomach/Bowel: Scattered diverticular change of the colon is noted. Fecal material is noted throughout the colon without obstructive change. The appendix is within normal limits. Small bowel and stomach are unremarkable. Vascular/Lymphatic: Atherosclerotic calcifications of the aorta are noted. No adenopathy is seen. Reproductive: Uterus and bilateral adnexa are unremarkable. Other: No abdominal wall hernia or abnormality. No abdominopelvic ascites. Musculoskeletal: No acute or significant osseous findings. IMPRESSION: Changes of prior granulomatous disease. Diverticulosis without diverticulitis. No other focal abnormality is noted. Electronically Signed   By: Oneil Devonshire M.D.   On: 03/14/2024 01:19     Procedures   Medications Ordered in the ED - No data to display                                  Medical Decision Making Amount and/or Complexity of Data Reviewed Labs: ordered. Radiology: ordered and independent interpretation performed. ECG/medicine tests: ordered and independent interpretation performed.  Risk Prescription drug management.   53 female presenting to the ED with abdominal pain, poor oral intake, and generally feeling unwell.  Had a STEMI last month and has been slow to return to baseline.  She has lost about 16 pounds over the past month.  She reports nausea but denies any vomiting.  Afebrile, nontoxic in appearance.  Has  a little bit of periumbilical pain but has no significant distention, normal bowel sounds.  Labs  as above mild leukocytosis.  LFTs and bili are elevated.  Had some mild elevation on labs from May but this seems to have progressed.  She is currently on high-dose statin following her STEMI which may be contributing.  Denies any history or risk factors for hepatitis.  Occasional Tylenol  usage.  She denies any alcohol use.  Also has AKI.  Given IV fluids.  Suspect likely from poor oral intake.  CT without any acute findings.  She is status postcholecystectomy.  UA with rare bacteria, moderate leuks.  Denies current urinary symptoms.  Will send for culture.    Results discussed with patient, she voiced understanding.  She is tolerating oral fluids currently.  I have discussed with her option of admission for further evaluation of her transaminitis and correction of her renal function/AKI.  She is adamantly opposed to this and feels like that is what got her this predicament to start with.  She wants to go back home and follow-up outpatient with her doctor.  Will send hepatitis panel to help facilitate outpatient follow-up.  Advised she will need repeat labs in 1-2 weeks.  Avoid alcohol and tylenol  for now.  Will have her continue statin for now given recent MI but she was made aware this may be contributing.  Return here for any new/acute changes.  Final diagnoses:  Elevated liver enzymes  AKI (acute kidney injury) [facility])  Dehydration    ED Discharge Orders     None          Jarold Olam HERO, PA-C 03/14/24 0402    Garrick Charleston, MD 03/18/24 1930

## 2024-03-14 ENCOUNTER — Emergency Department (HOSPITAL_COMMUNITY)

## 2024-03-14 DIAGNOSIS — R531 Weakness: Secondary | ICD-10-CM | POA: Diagnosis not present

## 2024-03-14 DIAGNOSIS — R109 Unspecified abdominal pain: Secondary | ICD-10-CM | POA: Diagnosis not present

## 2024-03-14 DIAGNOSIS — R935 Abnormal findings on diagnostic imaging of other abdominal regions, including retroperitoneum: Secondary | ICD-10-CM | POA: Diagnosis not present

## 2024-03-14 DIAGNOSIS — Z743 Need for continuous supervision: Secondary | ICD-10-CM | POA: Diagnosis not present

## 2024-03-14 DIAGNOSIS — K5730 Diverticulosis of large intestine without perforation or abscess without bleeding: Secondary | ICD-10-CM | POA: Diagnosis not present

## 2024-03-14 LAB — URINALYSIS, W/ REFLEX TO CULTURE (INFECTION SUSPECTED)
Bilirubin Urine: NEGATIVE
Glucose, UA: NEGATIVE mg/dL
Ketones, ur: 20 mg/dL — AB
Nitrite: NEGATIVE
Protein, ur: 100 mg/dL — AB
Specific Gravity, Urine: 1.03 (ref 1.005–1.030)
pH: 5 (ref 5.0–8.0)

## 2024-03-14 LAB — HEPATITIS PANEL, ACUTE
HCV Ab: NONREACTIVE
Hep A IgM: NONREACTIVE
Hep B C IgM: NONREACTIVE
Hepatitis B Surface Ag: NONREACTIVE

## 2024-03-14 LAB — ACETAMINOPHEN LEVEL: Acetaminophen (Tylenol), Serum: <10 ug/mL — ABNORMAL LOW (ref 10–30)

## 2024-03-14 MED ORDER — IOHEXOL 300 MG/ML  SOLN
50.0000 mL | Freq: Once | INTRAMUSCULAR | Status: AC | PRN
  Administered 2024-03-14: 50 mL via INTRAVENOUS

## 2024-03-14 MED ORDER — SODIUM CHLORIDE 0.9 % IV BOLUS
1000.0000 mL | Freq: Once | INTRAVENOUS | Status: AC
  Administered 2024-03-14: 1000 mL via INTRAVENOUS

## 2024-03-14 NOTE — Discharge Instructions (Addendum)
 As we discussed, your liver enzymes are elevated today.  You also showed signs of dehydration.  We have given you fluids to help correct this. Your cholesterol medicine can be causing some elevation in liver tests but need to stay on this right now given recent heart attack. I have offered you admission but you preferred to go home and follow-up with your doctor. You will need recheck of your liver tests and kidney function in about 1 to 2 weeks. Avoid alcohol and Tylenol  for now.  Make sure to stay hydrated. Return here for any new or acute changes-- fever, cannot eat/drink, etc.

## 2024-03-14 NOTE — ED Notes (Signed)
 Patient transported to CT

## 2024-03-14 NOTE — Progress Notes (Unsigned)
 [address]       Kaitlyn Wright [place] [address]             [phone]       HPI: Kaitlyn Wright is an 78 year old woman with a past medical history of hypothyroidism, anxiety, depression, and CAD s/p NSTEMI. The patient returns for routine postoperative follow-up having undergone CABG x 4 on 05/09 by Dr. Kerrin. The patient's early postoperative recovery while in the hospital was notable for hypotension that resolved with time, ulceration of her throat that improved with cepacol and magic mouthwash, pleural effusion requiring thoracentesis which resulted in post thoracentesis pneumothorax which remained stable and no chest tube was placed due to patient refusal, UTI requiring a 3 day course of bactrim  and pneumonia requiring IV Zosyn . She was discharged in stable condition to SNF on 05/28. She recently presented to the ED with nausea and epigastric pain. She was found to have an AKI felt likely due to dehydration and elevated LFTs possibly due to statin. Admission was recommended but she refused, hydration and follow up lab work was recommended. Since hospital discharge the patient denies chest pain, chest soreness, shortness of breath, lower extremity swelling, dizziness and LOC. She uses a walker when out of the house but does not use it while at home. She was discharged from the SNF and is at home with home health now. She is not yet walking her dogs. She reports she went to the ED for a belly ache that resolved. She was taking 2 Tylenol  per day but has stopped taking Tylenol  due to elevated LFTs. She saw cardiology yesterday and they discontinued her Midodrine , Amiodarone , Torsemide  and potassium.   She tells me she would like her daughter removed from her contact list today.    Current Outpatient Medications  Medication Sig Dispense Refill   acetaminophen  (TYLENOL ) 325 MG tablet Take 2 tablets (650 mg total) by mouth every 6 (six) hours as needed for mild pain (pain score  1-3).     ALPRAZolam  (XANAX ) 1 MG tablet Take 1 tablet (1 mg total) by mouth See admin instructions. Take 1mg  (1 tablet) by mouth every night and 1mg  (1 tablet) during the day if needed. 30 tablet 1   amiodarone  (PACERONE ) 200 MG tablet Take 1 tablet (200 mg total) by mouth daily.     aspirin  EC 81 MG tablet Take 1 tablet (81 mg total) by mouth daily. Swallow whole. 60 tablet 1   atorvastatin  (LIPITOR ) 80 MG tablet Take 1 tablet (80 mg total) by mouth daily at 6 PM.     B Complex-C (B-COMPLEX WITH VITAMIN C) tablet Take 1 tablet by mouth daily.     cholecalciferol  (VITAMIN D ) 1000 UNITS tablet Take 1,000 Units by mouth daily.     Cinnamon 500 MG capsule Take 500 mg by mouth daily.     clopidogrel  (PLAVIX ) 75 MG tablet Take 1 tablet (75 mg total) by mouth daily.     hydrOXYzine  (ATARAX ) 10 MG tablet Take 10 mg by mouth at bedtime.     Krill Oil 1000 MG CAPS Take 1 capsule by mouth daily.     latanoprost  (XALATAN ) 0.005 % ophthalmic solution Place 1 drop into both eyes at bedtime.     levothyroxine  (SYNTHROID ) 88 MCG tablet Take 88 mcg by mouth daily.     loperamide  (IMODIUM ) 1 MG/5ML solution Take 5 mLs by mouth daily as needed for diarrhea or loose stools.  metoprolol  tartrate (LOPRESSOR ) 25 MG tablet Take 0.5 tablets (12.5 mg total) by mouth 2 (two) times daily.     midodrine  (PROAMATINE ) 10 MG tablet Take 1 tablet (10 mg total) by mouth 3 (three) times daily with meals.     Omega-3 Fatty Acids (FISH OIL) 1000 MG CPDR Take 1 capsule by mouth 2 (two) times daily.     pantoprazole  (PROTONIX ) 40 MG tablet Take 40 mg by mouth daily. (Patient not taking: Reported on 01/23/2024)     Polyethyl Glycol-Propyl Glycol (SYSTANE FREE OP) Place 1 drop into both eyes 2 (two) times daily.     potassium chloride  SA (KLOR-CON  M) 20 MEQ tablet Take 1 tablet every day you take Demadex      Red Yeast Rice 600 MG CAPS Take 2 capsules by mouth 2 (two) times daily.     torsemide  (DEMADEX ) 20 MG tablet Take 1 tablet  every Monday and Friday and take 1 tablet daily as needed for swelling or weight gain greater than 3lbs in 1 night or 5lbs in 1 week     traMADol  (ULTRAM ) 50 MG tablet Take 1 tablet (50 mg total) by mouth every 6 (six) hours as needed for moderate pain (pain score 4-6). 28 tablet 0   vitamin C (ASCORBIC ACID) 500 MG tablet Take 500 mg by mouth daily.     No current facility-administered medications for this visit.   Vitals: Today's Vitals   03/20/24 1329  BP: 124/69  Pulse: 71  Resp: 20  SpO2: 99%  Weight: 115 lb (52.2 kg)  Height: 5' 3 (1.6 m)   Body mass index is 20.37 kg/m.   Physical Exam: General: Alert and oriented, no acute distress Neuro: Grossly intact CV: Regular rate and rhythm, no murmur Pulm: Clear to auscultation bilaterally GI: +BS, nontender, no distension Extremities: No edema BLE Wounds: Clean and dry without sign of infection  Diagnostic Tests: Narrative & Impression  CLINICAL DATA:  cabg x 4   EXAM: CHEST - 2 VIEW   COMPARISON:  02/14/2024   FINDINGS: Lungs clear.   Heart size and mediastinal contours are within normal limits. CABG markers. Aortic Atherosclerosis (ICD10-170.0).   No effusion.   Sternotomy wires.   IMPRESSION: No acute cardiopulmonary disease.     Electronically Signed   By: JONETTA Faes M.D.   On: 03/20/2024 13:49    Impression/Plan: S/P CABG: The patient is progressing very well from surgery. She denies sternal soreness and pain and no longer takes narcotic pain medication or Tylenol . She denies shortness of breath and states she is walking at home without her walker. She reports she is walking fine but not walking her dogs yet. Her CXR today shows resolved pneumothorax and no further pleural effusion or consolidation. She was nauseous with abdominal pain that brought her to the ED, this has improved since then but she is watching what she eats. She reports she lost about 16lbs in the hospital because she did not like the  food. She has stopped taking tylenol  due to transaminitis, midodrine  and Amiodarone  were also discontinued by cardiology due to this and the patient has not had any further palpitations. She had an AKI in the ED likely due to dehydration, Torsemide  and potassium were discontinued. Her blood pressure is stable today without midodrine  and she seems to be in NSR. I will not make any medication changes at this time. Her incisions are healing well without sign of infection. I cleared her to drive today and cleared her  for cardiac rehab but she is not interested since she feels she is doing well with her activity at home and she has home health PT at the moment. We discussed continued sternal precautions for 3 months. 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 Patient will continue to follow up with cardiology. Plan to have the patient return to clinic as needed.  Con GORMAN Bend, PA-C [facility] [phone]

## 2024-03-15 LAB — URINE CULTURE

## 2024-03-19 ENCOUNTER — Ambulatory Visit: Attending: Cardiology | Admitting: Cardiology

## 2024-03-19 ENCOUNTER — Encounter: Payer: Self-pay | Admitting: Cardiology

## 2024-03-19 VITALS — BP 124/64 | HR 80 | Ht 63.0 in | Wt 115.0 lb

## 2024-03-19 DIAGNOSIS — Z951 Presence of aortocoronary bypass graft: Secondary | ICD-10-CM | POA: Diagnosis not present

## 2024-03-19 DIAGNOSIS — E039 Hypothyroidism, unspecified: Secondary | ICD-10-CM | POA: Diagnosis not present

## 2024-03-19 DIAGNOSIS — I9789 Other postprocedural complications and disorders of the circulatory system, not elsewhere classified: Secondary | ICD-10-CM

## 2024-03-19 DIAGNOSIS — E7800 Pure hypercholesterolemia, unspecified: Secondary | ICD-10-CM | POA: Diagnosis not present

## 2024-03-19 DIAGNOSIS — I214 Non-ST elevation (NSTEMI) myocardial infarction: Secondary | ICD-10-CM

## 2024-03-19 DIAGNOSIS — R7303 Prediabetes: Secondary | ICD-10-CM | POA: Diagnosis not present

## 2024-03-19 DIAGNOSIS — I4891 Unspecified atrial fibrillation: Secondary | ICD-10-CM

## 2024-03-19 DIAGNOSIS — F419 Anxiety disorder, unspecified: Secondary | ICD-10-CM | POA: Diagnosis not present

## 2024-03-19 DIAGNOSIS — Z79899 Other long term (current) drug therapy: Secondary | ICD-10-CM | POA: Diagnosis not present

## 2024-03-19 DIAGNOSIS — N183 Chronic kidney disease, stage 3 unspecified: Secondary | ICD-10-CM | POA: Diagnosis not present

## 2024-03-19 NOTE — Progress Notes (Signed)
 Cardiology Office Note:   Date:  03/19/2024  ID:  Kaitlyn Wright, DOB July 17, 1946, MRN [id number] PCP: Arloa Elsie SAUNDERS, MD  [facility] Providers Cardiologist:  None    History of Present Illness:    Discussed the use of AI scribe software for clinical note transcription with the patient, who gave verbal consent to proceed.  History of Present Illness Kaitlyn Wright is an 78 year old female with coronary artery disease status post bypass surgery, postoperative afib who presents for follow-up after surgery.   She underwent coronary artery bypass surgery in May 2025 after presenting with severe substernal chest pressure. Initial workup in the emergency department showed elevated troponin levels and an EKG with T wave inversion in inferolateral leads. Cardiac catheterization revealed severe diffuse coronary calcification, leading to the recommendation for bypass surgery. She underwent surgery with LIMA to LAD, SVG to PDA, and SVG to ramus, with endoscopic harvest of the right greater saphenous vein. Postoperatively, she required vasopressor and inotropic support due to pulmonary edema and respiratory failure but was eventually weaned off medications and discharged to rehab on Feb 15, 2024. Today patient reports having a frustrating experience at rehab. She is now ambulating with a rollator though has limited mobility due to chronic back pain and longstanding balance issues.  Since discharge, she was seen in the emergency department on March 13, 2024, for abdominal pain, frequent nausea, and decreased oral intake. Lab work showed transaminitis with elevated AST, ALT, and ALK phos levels. No new chest pain, shortness of breath, palpitations, or swelling in the legs since her discharge. She is confused about her medications, leading to a fear of taking the wrong pills and has not taken anything except Xanax  for the last week. She is currently supposed to be taking amiodarone  200 mg daily, aspirin  81 mg,  atorvastatin  80 mg, Plavix  75 mg, and torsemide  20 mg twice weekly with PRN doses for weight gain. She wants to take only necessary medications and prefers generic prescriptions due to cost concerns.  She is able to walk with a walker and from chair to chair in her living room but experiences significant back pain, which she attributes to not being able to visit her chiropractor since surgery. She has used a walker since returning home from rehab. No pain at her surgical incision site. She feels 'old and tired' but otherwise feels she is doing okay.   Studies Reviewed:    EKG:   EKG Interpretation Date/Time:  Monday March 19 2024 14:51:23 EDT Ventricular Rate:  76 PR Interval:  154 QRS Duration:  90 QT Interval:  422 QTC Calculation: 474 R Axis:   42  Text Interpretation: Normal sinus rhythm T wave abnormality, consider inferolateral ischemia Prolonged QT When compared with ECG of 13-Mar-2024 22:05, PREVIOUS ECG IS PRESENT Confirmed by Trudy Birmingham [phone]) on 03/19/2024 2:55:01 PM      Risk Assessment/Calculations:    CHA2DS2-VASc Score =     This indicates a  % annual risk of stroke. The patient's score is based upon:               Physical Exam:   VS:  BP 124/64   Pulse 80   Ht 5' 3 (1.6 m)   Wt 115 lb (52.2 kg)   SpO2 93%   BMI 20.37 kg/m    Wt Readings from Last 3 Encounters:  03/19/24 115 lb (52.2 kg)  02/15/24 119 lb 14.9 oz (54.4 kg)  12/10/22 136 lb (61.7 kg)  Physical Exam Vitals reviewed.  Constitutional:      Appearance: Normal appearance.  HENT:     Head: Normocephalic.     Nose: Nose normal.   Eyes:     Pupils: Pupils are equal, round, and reactive to light.    Cardiovascular:     Rate and Rhythm: Normal rate and regular rhythm.     Pulses: Normal pulses.     Heart sounds: Normal heart sounds. No murmur heard.    No friction rub. No gallop.  Pulmonary:     Effort: Pulmonary effort is normal.     Breath sounds: Normal breath sounds.   Abdominal:     General: Abdomen is flat.   Musculoskeletal:     Right lower leg: No edema.     Left lower leg: No edema.   Skin:    General: Skin is warm and dry.     Capillary Refill: Capillary refill takes less than 2 seconds.     Comments: Median sternotomy incision healing well.   Neurological:     General: No focal deficit present.     Mental Status: She is alert and oriented to person, place, and time.   Psychiatric:        Mood and Affect: Mood normal.        Behavior: Behavior normal.        Thought Content: Thought content normal.        Judgment: Judgment normal.     ASSESSMENT AND PLAN:    Assessment & Plan Coronary artery disease with recent bypass surgery Status post coronary artery bypass grafting (CABG) with LIMA to LAD, SVG to PDA, and SVG to ramus. Postoperative recovery complicated by shock and RV failure, now without signs/symptoms of CHF. Echo 5/14 showed EF 60-65 and normal-low RV. No recurrent angina or dyspnea since discharge. Requires clarification of cardiac medications due to confusion. - Highly concerned about medication compliance. Will need to simplify her regimen to help with this.  - Discontinue amiodarone  - Discontinue midodrine  - Discontinue potassium - Discontinue torsemide  - Continue aspirin , atorvastatin , clopidogrel , and metoprolol . Will highlight on medication list   Postoperative atrial fibrillation Postoperative atrial fibrillation managed with amiodarone , now in sinus rhythm. Decision made to discontinue amiodarone  today due to potential gastrointestinal side effects, recent transaminitis, and current sinus rhythm. - Discontinue amiodarone   Post-operative cardiogenic shock with RV failure Echo 5/14 EF 60-65 and normal-low RV, stable symptoms at time of discharge and on exam today, no evidence of pulmonary edema. Discontinuation of torsemide  as diuretic therapy no longer necessary. - Discontinue torsemide   Transaminitis Recent  emergency department visit for abdominal pain revealed transaminitis with elevated AST and ALT. Potential contributing factor includes medication side effects, particularly from amiodarone . Decision to discontinue amiodarone  to prevent further hepatic strain. - Discontinue amiodarone         Cardiac Rehabilitation Eligibility Assessment  The patient is NOT ready to start cardiac rehabilitation due to: The patient is non-ambulatory or cannot walk a total of 30 minutes in 1 day.        Signed, Artist Pouch, PA-C

## 2024-03-19 NOTE — Patient Instructions (Addendum)
 Medication Instructions:  *If you need a refill on your cardiac medications before your next appointment, please call your pharmacy*  Your cardiac medication list has been updated.   Lab Work: No labs were ordered during today's visit.  If you have labs (blood work) drawn today and your tests are completely normal, you will receive your results only by: MyChart Message (if you have MyChart) OR A paper copy in the mail If you have any lab test that is abnormal or we need to change your treatment, we will call you to review the results.   Testing/Procedures: No procedures were ordered during today's visit.    Follow-Up: At [facility], you and your health needs are our priority.  As part of our continuing mission to provide you with exceptional heart care, we have created designated Provider Care Teams.  These Care Teams include your primary Cardiologist (physician) and Advanced Practice Providers (APPs -  Physician Assistants and Nurse Practitioners) who all work together to provide you with the care you need, when you need it.  We recommend signing up for the patient portal called MyChart.  Sign up information is provided on this After Visit Summary.  MyChart is used to connect with patients for Virtual Visits (Telemedicine).  Patients are able to view lab/test results, encounter notes, upcoming appointments, etc.  Non-urgent messages can be sent to your provider as well.   To learn more about what you can do with MyChart, go to [email or web address].    Your next appointment:   2 month(s)  Provider:  ANY AVAILABLE APP     Other Instructions Thank you for choosing [facility]!

## 2024-03-20 ENCOUNTER — Ambulatory Visit: Payer: Self-pay | Attending: Thoracic Surgery (Cardiothoracic Vascular Surgery) | Admitting: Physician Assistant

## 2024-03-20 ENCOUNTER — Encounter: Payer: Self-pay | Admitting: Physician Assistant

## 2024-03-20 ENCOUNTER — Ambulatory Visit (HOSPITAL_COMMUNITY)
Admission: RE | Admit: 2024-03-20 | Discharge: 2024-03-20 | Disposition: A | Discharge status: Home / Self Care | Source: Ambulatory Visit | Attending: Cardiology | Admitting: Cardiology

## 2024-03-20 VITALS — BP 124/69 | HR 71 | Resp 20 | Ht 63.0 in | Wt 115.0 lb

## 2024-03-20 DIAGNOSIS — I700 Atherosclerosis of aorta: Secondary | ICD-10-CM | POA: Diagnosis not present

## 2024-03-20 DIAGNOSIS — Z951 Presence of aortocoronary bypass graft: Secondary | ICD-10-CM | POA: Diagnosis not present

## 2024-03-20 NOTE — Patient Instructions (Signed)
 Continue to avoid any heavy lifting or strenuous use of your arms or shoulders for at least a total of three months from the time of surgery.  After three months you may gradually increase how much you lift or otherwise use your arms or chest as tolerated, with limits based upon whether or not activities lead to the return of significant discomfort.  You may return to driving an automobile as long as you are no longer requiring oral narcotic pain relievers during the daytime.  It would be wise to start driving only short distances during the daylight and gradually increase from there as you feel comfortable.  You are encouraged to enroll and participate in the outpatient cardiac rehab program beginning as soon as practical.

## 2024-03-22 ENCOUNTER — Telehealth: Payer: Self-pay

## 2024-03-22 DIAGNOSIS — Z79899 Other long term (current) drug therapy: Secondary | ICD-10-CM

## 2024-03-26 ENCOUNTER — Telehealth: Payer: Self-pay | Admitting: Cardiology

## 2024-03-26 MED ORDER — LEVOTHYROXINE SODIUM 88 MCG PO TABS: 88.0000 ug | ORAL_TABLET | Freq: Every day | ORAL | 3 refills | Status: AC

## 2024-03-26 MED ORDER — CLOPIDOGREL BISULFATE 75 MG PO TABS: 75.0000 mg | ORAL_TABLET | Freq: Every day | ORAL | 3 refills | Status: DC

## 2024-03-26 MED ORDER — PANTOPRAZOLE SODIUM 40 MG PO TBEC: 40.0000 mg | DELAYED_RELEASE_TABLET | Freq: Every day | ORAL | 3 refills | Status: DC

## 2024-03-26 MED ORDER — ATORVASTATIN CALCIUM 80 MG PO TABS: 80.0000 mg | ORAL_TABLET | Freq: Every day | ORAL | 3 refills | Status: DC

## 2024-03-26 MED ORDER — METOPROLOL TARTRATE 25 MG PO TABS: 12.5000 mg | ORAL_TABLET | Freq: Two times a day (BID) | ORAL | 3 refills | Status: DC

## 2024-03-26 NOTE — Telephone Encounter (Signed)
 RX sent to requested Pharmacy

## 2024-03-26 NOTE — Telephone Encounter (Signed)
*  STAT* If patient is at the pharmacy, call can be transferred to refill team.   1. Which medications need to be refilled? (please list name of each medication and dose if known)   atorvastatin  (LIPITOR ) 80 MG tablet  clopidogrel  (PLAVIX ) 75 MG tablet  levothyroxine  (SYNTHROID ) 88 MCG tablet  metoprolol  tartrate (LOPRESSOR ) 25 MG tablet  pantoprazole  (PROTONIX ) 40 MG tablet   2. Would you like to learn more about the convenience, safety, & potential cost savings by using the [facility]?   3. Are you open to using the [facility] (Type [facility]. ).  4. Which pharmacy/location (including street and city if local pharmacy) is medication to be sent to?  [id number] - [place], [place] - [address] AT [facility] [address]    5. Do they need a 30 day or 90 day supply?   90 day  Patient stated she is completely out of these medications.

## 2024-03-28 MED ORDER — METOPROLOL TARTRATE 25 MG PO TABS: 12.5000 mg | ORAL_TABLET | Freq: Two times a day (BID) | ORAL | 3 refills | Status: DC

## 2024-03-28 MED ORDER — CLOPIDOGREL BISULFATE 75 MG PO TABS: 75.0000 mg | ORAL_TABLET | Freq: Every day | ORAL | 3 refills | Status: AC

## 2024-03-28 MED ORDER — ATORVASTATIN CALCIUM 80 MG PO TABS: 80.0000 mg | ORAL_TABLET | Freq: Every day | ORAL | 3 refills | Status: AC

## 2024-03-28 MED ORDER — PANTOPRAZOLE SODIUM 40 MG PO TBEC: 40.0000 mg | DELAYED_RELEASE_TABLET | Freq: Every day | ORAL | 3 refills | Status: AC

## 2024-03-28 NOTE — Telephone Encounter (Signed)
 RX sent to requested Pharmacy

## 2024-03-28 NOTE — Addendum Note (Signed)
 Addended by: BLUFORD, Noble Cicalese L on: 03/28/2024 09:25 AM   Modules accepted: Orders

## 2024-03-28 NOTE — Telephone Encounter (Signed)
 Please resend medication, it didn't go through. Patient is completely out of medication. Please advise

## 2024-04-06 ENCOUNTER — Emergency Department (HOSPITAL_COMMUNITY)

## 2024-04-06 ENCOUNTER — Encounter (HOSPITAL_COMMUNITY): Payer: Self-pay

## 2024-04-06 ENCOUNTER — Inpatient Hospital Stay (HOSPITAL_COMMUNITY)
Admission: EM | Admit: 2024-04-06 | Discharge: 2024-04-16 | DRG: 314 | Disposition: A | Discharge status: Home Health | Attending: Internal Medicine | Admitting: Internal Medicine

## 2024-04-06 ENCOUNTER — Other Ambulatory Visit: Payer: Self-pay

## 2024-04-06 DIAGNOSIS — Z79890 Hormone replacement therapy (postmenopausal): Secondary | ICD-10-CM

## 2024-04-06 DIAGNOSIS — I480 Paroxysmal atrial fibrillation: Secondary | ICD-10-CM | POA: Diagnosis present

## 2024-04-06 DIAGNOSIS — W19XXXA Unspecified fall, initial encounter: Secondary | ICD-10-CM | POA: Diagnosis present

## 2024-04-06 DIAGNOSIS — Z9181 History of falling: Secondary | ICD-10-CM

## 2024-04-06 DIAGNOSIS — M4312 Spondylolisthesis, cervical region: Secondary | ICD-10-CM | POA: Diagnosis present

## 2024-04-06 DIAGNOSIS — M6282 Rhabdomyolysis: Secondary | ICD-10-CM | POA: Diagnosis not present

## 2024-04-06 DIAGNOSIS — E785 Hyperlipidemia, unspecified: Secondary | ICD-10-CM | POA: Diagnosis present

## 2024-04-06 DIAGNOSIS — I10 Essential (primary) hypertension: Secondary | ICD-10-CM | POA: Diagnosis present

## 2024-04-06 DIAGNOSIS — W1809XA Striking against other object with subsequent fall, initial encounter: Secondary | ICD-10-CM | POA: Diagnosis present

## 2024-04-06 DIAGNOSIS — M50321 Other cervical disc degeneration at C4-C5 level: Secondary | ICD-10-CM | POA: Diagnosis not present

## 2024-04-06 DIAGNOSIS — R9431 Abnormal electrocardiogram [ECG] [EKG]: Secondary | ICD-10-CM

## 2024-04-06 DIAGNOSIS — G9341 Metabolic encephalopathy: Secondary | ICD-10-CM | POA: Diagnosis present

## 2024-04-06 DIAGNOSIS — G8929 Other chronic pain: Secondary | ICD-10-CM | POA: Diagnosis present

## 2024-04-06 DIAGNOSIS — E876 Hypokalemia: Secondary | ICD-10-CM | POA: Diagnosis not present

## 2024-04-06 DIAGNOSIS — R4182 Altered mental status, unspecified: Secondary | ICD-10-CM | POA: Diagnosis not present

## 2024-04-06 DIAGNOSIS — M25551 Pain in right hip: Secondary | ICD-10-CM | POA: Diagnosis not present

## 2024-04-06 DIAGNOSIS — M50323 Other cervical disc degeneration at C6-C7 level: Secondary | ICD-10-CM | POA: Diagnosis present

## 2024-04-06 DIAGNOSIS — R748 Abnormal levels of other serum enzymes: Secondary | ICD-10-CM | POA: Diagnosis present

## 2024-04-06 DIAGNOSIS — F32A Depression, unspecified: Secondary | ICD-10-CM | POA: Diagnosis present

## 2024-04-06 DIAGNOSIS — I959 Hypotension, unspecified: Secondary | ICD-10-CM | POA: Diagnosis not present

## 2024-04-06 DIAGNOSIS — E861 Hypovolemia: Secondary | ICD-10-CM | POA: Diagnosis present

## 2024-04-06 DIAGNOSIS — K219 Gastro-esophageal reflux disease without esophagitis: Secondary | ICD-10-CM | POA: Diagnosis present

## 2024-04-06 DIAGNOSIS — R110 Nausea: Secondary | ICD-10-CM | POA: Diagnosis present

## 2024-04-06 DIAGNOSIS — R200 Anesthesia of skin: Secondary | ICD-10-CM | POA: Diagnosis present

## 2024-04-06 DIAGNOSIS — R9082 White matter disease, unspecified: Secondary | ICD-10-CM | POA: Diagnosis not present

## 2024-04-06 DIAGNOSIS — T796XXA Traumatic ischemia of muscle, initial encounter: Secondary | ICD-10-CM | POA: Diagnosis present

## 2024-04-06 DIAGNOSIS — M47812 Spondylosis without myelopathy or radiculopathy, cervical region: Secondary | ICD-10-CM | POA: Diagnosis present

## 2024-04-06 DIAGNOSIS — I700 Atherosclerosis of aorta: Secondary | ICD-10-CM | POA: Diagnosis not present

## 2024-04-06 DIAGNOSIS — Y92009 Unspecified place in unspecified non-institutional (private) residence as the place of occurrence of the external cause: Secondary | ICD-10-CM | POA: Diagnosis not present

## 2024-04-06 DIAGNOSIS — J320 Chronic maxillary sinusitis: Secondary | ICD-10-CM | POA: Diagnosis not present

## 2024-04-06 DIAGNOSIS — Z823 Family history of stroke: Secondary | ICD-10-CM

## 2024-04-06 DIAGNOSIS — E860 Dehydration: Secondary | ICD-10-CM | POA: Diagnosis present

## 2024-04-06 DIAGNOSIS — Z951 Presence of aortocoronary bypass graft: Secondary | ICD-10-CM | POA: Diagnosis not present

## 2024-04-06 DIAGNOSIS — S0990XA Unspecified injury of head, initial encounter: Secondary | ICD-10-CM | POA: Diagnosis not present

## 2024-04-06 DIAGNOSIS — R410 Disorientation, unspecified: Secondary | ICD-10-CM | POA: Diagnosis present

## 2024-04-06 DIAGNOSIS — I340 Nonrheumatic mitral (valve) insufficiency: Secondary | ICD-10-CM | POA: Diagnosis present

## 2024-04-06 DIAGNOSIS — R55 Syncope and collapse: Secondary | ICD-10-CM | POA: Diagnosis not present

## 2024-04-06 DIAGNOSIS — F419 Anxiety disorder, unspecified: Secondary | ICD-10-CM | POA: Diagnosis present

## 2024-04-06 DIAGNOSIS — I2510 Atherosclerotic heart disease of native coronary artery without angina pectoris: Secondary | ICD-10-CM | POA: Diagnosis present

## 2024-04-06 DIAGNOSIS — I951 Orthostatic hypotension: Secondary | ICD-10-CM

## 2024-04-06 DIAGNOSIS — Z79899 Other long term (current) drug therapy: Secondary | ICD-10-CM

## 2024-04-06 DIAGNOSIS — M4802 Spinal stenosis, cervical region: Secondary | ICD-10-CM | POA: Diagnosis present

## 2024-04-06 DIAGNOSIS — Y92019 Unspecified place in single-family (private) house as the place of occurrence of the external cause: Secondary | ICD-10-CM | POA: Diagnosis not present

## 2024-04-06 DIAGNOSIS — Z886 Allergy status to analgesic agent status: Secondary | ICD-10-CM | POA: Diagnosis not present

## 2024-04-06 DIAGNOSIS — G9340 Encephalopathy, unspecified: Secondary | ICD-10-CM | POA: Diagnosis present

## 2024-04-06 DIAGNOSIS — Z9049 Acquired absence of other specified parts of digestive tract: Secondary | ICD-10-CM

## 2024-04-06 DIAGNOSIS — Z7982 Long term (current) use of aspirin: Secondary | ICD-10-CM

## 2024-04-06 DIAGNOSIS — S0993XA Unspecified injury of face, initial encounter: Secondary | ICD-10-CM | POA: Diagnosis not present

## 2024-04-06 DIAGNOSIS — Z91040 Latex allergy status: Secondary | ICD-10-CM | POA: Diagnosis not present

## 2024-04-06 DIAGNOSIS — M5021 Other cervical disc displacement,  high cervical region: Secondary | ICD-10-CM | POA: Diagnosis not present

## 2024-04-06 DIAGNOSIS — M549 Dorsalgia, unspecified: Secondary | ICD-10-CM | POA: Diagnosis present

## 2024-04-06 DIAGNOSIS — R7989 Other specified abnormal findings of blood chemistry: Secondary | ICD-10-CM | POA: Diagnosis present

## 2024-04-06 DIAGNOSIS — R9389 Abnormal findings on diagnostic imaging of other specified body structures: Secondary | ICD-10-CM | POA: Diagnosis not present

## 2024-04-06 DIAGNOSIS — J929 Pleural plaque without asbestos: Secondary | ICD-10-CM | POA: Diagnosis not present

## 2024-04-06 DIAGNOSIS — D72829 Elevated white blood cell count, unspecified: Secondary | ICD-10-CM | POA: Diagnosis not present

## 2024-04-06 DIAGNOSIS — E039 Hypothyroidism, unspecified: Secondary | ICD-10-CM | POA: Diagnosis present

## 2024-04-06 DIAGNOSIS — R109 Unspecified abdominal pain: Secondary | ICD-10-CM | POA: Diagnosis not present

## 2024-04-06 DIAGNOSIS — S7001XA Contusion of right hip, initial encounter: Secondary | ICD-10-CM | POA: Diagnosis present

## 2024-04-06 DIAGNOSIS — I6782 Cerebral ischemia: Secondary | ICD-10-CM | POA: Diagnosis not present

## 2024-04-06 DIAGNOSIS — Z7902 Long term (current) use of antithrombotics/antiplatelets: Secondary | ICD-10-CM

## 2024-04-06 DIAGNOSIS — R918 Other nonspecific abnormal finding of lung field: Secondary | ICD-10-CM | POA: Diagnosis not present

## 2024-04-06 LAB — CBC WITH DIFFERENTIAL/PLATELET
Abs Immature Granulocytes: 0.08 K/uL — ABNORMAL HIGH (ref 0.00–0.07)
Basophils Absolute: 0 K/uL (ref 0.0–0.1)
Basophils Relative: 0 %
Eosinophils Absolute: 0 K/uL (ref 0.0–0.5)
Eosinophils Relative: 0 %
HCT: 39.6 % (ref 36.0–46.0)
Hemoglobin: 13.5 g/dL (ref 12.0–15.0)
Immature Granulocytes: 1 %
Lymphocytes Relative: 5 %
Lymphs Abs: 0.8 K/uL (ref 0.7–4.0)
MCH: 31 pg (ref 26.0–34.0)
MCHC: 34.1 g/dL (ref 30.0–36.0)
MCV: 91 fL (ref 80.0–100.0)
Monocytes Absolute: 1.1 K/uL — ABNORMAL HIGH (ref 0.1–1.0)
Monocytes Relative: 7 %
Neutro Abs: 14.6 K/uL — ABNORMAL HIGH (ref 1.7–7.7)
Neutrophils Relative %: 87 %
Platelets: 332 K/uL (ref 150–400)
RBC: 4.35 MIL/uL (ref 3.87–5.11)
RDW: 15.5 % (ref 11.5–15.5)
WBC: 16.5 K/uL — ABNORMAL HIGH (ref 4.0–10.5)
nRBC: 0 % (ref 0.0–0.2)

## 2024-04-06 LAB — URINALYSIS, W/ REFLEX TO CULTURE (INFECTION SUSPECTED)
Bacteria, UA: NONE SEEN
Bilirubin Urine: NEGATIVE
Glucose, UA: 50 mg/dL — AB
Ketones, ur: 5 mg/dL — AB
Leukocytes,Ua: NEGATIVE
Nitrite: NEGATIVE
Protein, ur: 100 mg/dL — AB
Specific Gravity, Urine: 1.02 (ref 1.005–1.030)
pH: 5 (ref 5.0–8.0)

## 2024-04-06 LAB — COMPREHENSIVE METABOLIC PANEL WITH GFR
ALT: 390 U/L — ABNORMAL HIGH (ref 0–44)
AST: 298 U/L — ABNORMAL HIGH (ref 15–41)
Albumin: 3.2 g/dL — ABNORMAL LOW (ref 3.5–5.0)
Alkaline Phosphatase: 169 U/L — ABNORMAL HIGH (ref 38–126)
Anion gap: 14 (ref 5–15)
BUN: 11 mg/dL (ref 8–23)
CO2: 28 mmol/L (ref 22–32)
Calcium: 9.2 mg/dL (ref 8.9–10.3)
Chloride: 96 mmol/L — ABNORMAL LOW (ref 98–111)
Creatinine, Ser: 0.89 mg/dL (ref 0.44–1.00)
GFR, Estimated: >60 mL/min (ref >60)
Glucose, Bld: 127 mg/dL — ABNORMAL HIGH (ref 70–99)
Potassium: 4.1 mmol/L (ref 3.5–5.1)
Sodium: 138 mmol/L (ref 135–145)
Total Bilirubin: 3 mg/dL — ABNORMAL HIGH (ref 0.0–1.2)
Total Protein: 7.3 g/dL (ref 6.5–8.1)

## 2024-04-06 LAB — I-STAT CG4 LACTIC ACID, ED: Lactic Acid, Venous: 1.8 mmol/L (ref 0.5–1.9)

## 2024-04-06 LAB — PROTIME-INR
INR: 1.2 (ref 0.8–1.2)
Prothrombin Time: 16.2 s — ABNORMAL HIGH (ref 11.4–15.2)

## 2024-04-06 LAB — CK: Total CK: 855 U/L — ABNORMAL HIGH (ref 38–234)

## 2024-04-06 LAB — TSH: TSH: 3.772 u[IU]/mL (ref 0.350–4.500)

## 2024-04-06 MED ORDER — HEPARIN SODIUM (PORCINE) 5000 UNIT/ML IJ SOLN
5000.0000 [IU] | Freq: Three times a day (TID) | INTRAMUSCULAR | Status: DC
  Administered 2024-04-07 – 2024-04-16 (×25): 5000 [IU] via SUBCUTANEOUS
  Filled 2024-04-06 (×26): qty 1

## 2024-04-06 MED ORDER — LACTATED RINGERS IV BOLUS (SEPSIS)
1000.0000 mL | Freq: Once | INTRAVENOUS | Status: AC
  Administered 2024-04-06: 1000 mL via INTRAVENOUS

## 2024-04-06 MED ORDER — LACTATED RINGERS IV BOLUS: 1000.0000 mL | Freq: Once | INTRAVENOUS | Status: DC

## 2024-04-06 MED ORDER — POLYETHYLENE GLYCOL 3350 17 G PO PACK
17.0000 g | PACK | Freq: Every day | ORAL | Status: DC | PRN
  Administered 2024-04-16: 17 g via ORAL
  Filled 2024-04-06: qty 1

## 2024-04-06 MED ORDER — SODIUM CHLORIDE 0.9 % IV SOLN: INTRAVENOUS | Status: DC

## 2024-04-06 MED ORDER — ALBUMIN HUMAN 25 % IV SOLN
25.0000 g | INTRAVENOUS | Status: AC
  Administered 2024-04-06: 25 g via INTRAVENOUS
  Filled 2024-04-06: qty 100

## 2024-04-06 MED ORDER — LACTATED RINGERS IV SOLN: INTRAVENOUS | Status: DC

## 2024-04-06 MED ORDER — ASPIRIN 81 MG PO TBEC
81.0000 mg | DELAYED_RELEASE_TABLET | Freq: Every day | ORAL | Status: DC
  Administered 2024-04-07 – 2024-04-16 (×10): 81 mg via ORAL
  Filled 2024-04-06 (×10): qty 1

## 2024-04-06 MED ORDER — DOCUSATE SODIUM 100 MG PO CAPS
100.0000 mg | ORAL_CAPSULE | Freq: Two times a day (BID) | ORAL | Status: DC | PRN
  Administered 2024-04-12 – 2024-04-15 (×3): 100 mg via ORAL
  Filled 2024-04-06 (×4): qty 1

## 2024-04-06 MED ORDER — SODIUM CHLORIDE 0.9 % IV BOLUS: 500.0000 mL | Freq: Once | INTRAVENOUS | Status: DC

## 2024-04-06 MED ORDER — LIDOCAINE-EPINEPHRINE (PF) 2 %-1:200000 IJ SOLN: 10.0000 mL | Freq: Once | INTRAMUSCULAR | Status: DC

## 2024-04-06 MED ORDER — SODIUM CHLORIDE 0.9 % IV SOLN
1.0000 g | Freq: Once | INTRAVENOUS | Status: AC
  Administered 2024-04-07: 1 g via INTRAVENOUS
  Filled 2024-04-06: qty 10

## 2024-04-06 MED ORDER — SODIUM CHLORIDE 0.9 % IV BOLUS
1000.0000 mL | INTRAVENOUS | Status: AC
  Administered 2024-04-06: 1000 mL via INTRAVENOUS

## 2024-04-06 MED ORDER — SODIUM CHLORIDE 0.9 % IV BOLUS
500.0000 mL | Freq: Once | INTRAVENOUS | Status: AC
  Administered 2024-04-06: 500 mL via INTRAVENOUS

## 2024-04-06 MED ORDER — NOREPINEPHRINE 4 MG/250ML-% IV SOLN: 0.0000 ug/min | INTRAVENOUS | Status: DC

## 2024-04-06 MED ORDER — ATORVASTATIN CALCIUM 80 MG PO TABS: 80.0000 mg | ORAL_TABLET | Freq: Every day | ORAL | Status: DC

## 2024-04-06 MED ORDER — PANTOPRAZOLE SODIUM 40 MG PO TBEC
40.0000 mg | DELAYED_RELEASE_TABLET | Freq: Every day | ORAL | Status: DC
  Administered 2024-04-07 – 2024-04-16 (×10): 40 mg via ORAL
  Filled 2024-04-06 (×10): qty 1

## 2024-04-06 MED ORDER — SODIUM CHLORIDE 0.9 % IV SOLN: 250.0000 mL | INTRAVENOUS | Status: AC

## 2024-04-06 MED ORDER — LEVOTHYROXINE SODIUM 88 MCG PO TABS
88.0000 ug | ORAL_TABLET | Freq: Every day | ORAL | Status: DC
  Administered 2024-04-07 – 2024-04-16 (×9): 88 ug via ORAL
  Filled 2024-04-06 (×10): qty 1

## 2024-04-06 MED ORDER — MIDODRINE HCL 5 MG PO TABS
10.0000 mg | ORAL_TABLET | Freq: Three times a day (TID) | ORAL | Status: DC
  Administered 2024-04-07 – 2024-04-16 (×25): 10 mg via ORAL
  Filled 2024-04-06 (×26): qty 2

## 2024-04-06 MED ORDER — SODIUM CHLORIDE 0.9 % IV BOLUS: 1000.0000 mL | Freq: Once | INTRAVENOUS | Status: DC

## 2024-04-06 MED ORDER — MIDODRINE HCL 5 MG PO TABS
10.0000 mg | ORAL_TABLET | ORAL | Status: AC
  Administered 2024-04-06: 10 mg via ORAL
  Filled 2024-04-06: qty 2

## 2024-04-06 NOTE — Consult Note (Incomplete)
 NAME:  Kaitlyn Wright, MRN:  [id number], DOB:  September 30, 1945, LOS: 0 ADMISSION DATE:  04/06/2024, CONSULTATION DATE:  04/06/2024 REFERRING MD: Alfornia Madison, MD, CHIEF COMPLAINT:  hypotension    History of Present Illness:  A 78 y.o. female patient with CAD s/p CABG 01/27/2024 (aspirin , atorvastatin , clopidogrel , and metoprolol ), postop A-fib (not on [facility]), elevated LFTs (Amiodarone  was d/c March 19, 2024), anxiety, depression, hypothyroidism, and hyperlipidemia. Midodrine  was d/c March 19, 2024 due to simplify her regimen. She presented to ED with AMS and falls.  She also mentions falling against her sink a few weeks ago.  Denies chest pain, SOB, LL edema, f/c/r, or N/V/D. She has pain allover.  Per ED report, patient was last seen by a neighbor 3 days ago and then today found on the ground with a malodorous smell and was confused.  She was mildly hypotensive with EMS during transport. She received 4 L crystalloids, Midodrine  10 mg, and Albumin  25 mg (25%), yet her BP is borderline low and PCCM was consulted for eval.   Pertinent  Medical History  CAD s/p CABG 01/27/2024, postop A-fib, elevated LFTs, anxiety, depression, hypothyroidism, hyperlipidemia.  Significant Hospital Events: Including procedures, antibiotic start and stop dates in addition to other pertinent events   Chest x-ray showing possible trace left pleural effusion. CT head negative for acute intracranial abnormality. CT maxillofacial showing no evidence of acute traumatic injury. X-ray of right hip/pelvis negative for fracture or malalignment.  EKG showing sinus rhythm, no STEMI, QTc 575. CK, WBC, are LFT are high.   Interim History / Subjective:    Objective    Blood pressure 92/64, pulse 77, temperature 97.6 F (36.4 C), temperature source Oral, resp. rate 16, height 5' 3 (1.6 m), weight 52.2 kg, SpO2 100%.        Intake/Output Summary (Last 24 hours) at 04/06/2024 2348 Last data filed at 04/06/2024 2333 Gross per 24 hour   Intake 2291.48 ml  Output --  Net 2291.48 ml   Filed Weights   04/06/24 1444  Weight: 52.2 kg    Examination: General: alert, oriented year and place. Comfortable. On 2 L [place]. SpO2 99%  HENT: PERL, dry oral mucosa. No LNE or thyromegaly. No JVD Lungs: symmetrical air entry bilaterally. No crackles or wheezing Cardiovascular: NL S1/S2. No m/g/r Abdomen: no distension or tenderness. Right flank bruise and tenederness.  Extremities: no edema. Symmetrical  Neuro: nonfocal    Resolved problem list   Assessment and Plan    Best Practice (right click and Reselect all SmartList Selections daily)   Diet/type: {diet type:25684} DVT prophylaxis {anticoagulation:25687} Pressure ulcer(s): {pressure ulcer(s):31683} GI prophylaxis: {HP:73065} Lines: {Central Venous Access:25771} Foley:  {Central Venous Access:25691} Code Status:  {Code Status:26939} Last date of multidisciplinary goals of care discussion [***]  Labs   CBC: Recent Labs  Lab 04/06/24 1430  WBC 16.5*  NEUTROABS 14.6*  HGB 13.5  HCT 39.6  MCV 91.0  PLT 332    Basic Metabolic Panel: Recent Labs  Lab 04/06/24 1430  NA 138  K 4.1  CL 96*  CO2 28  GLUCOSE 127*  BUN 11  CREATININE 0.89  CALCIUM  9.2   GFR: Estimated Creatinine Clearance: 41.5 mL/min (by C-G formula based on SCr of 0.89 mg/dL). Recent Labs  Lab 04/06/24 1430 04/06/24 1442  WBC 16.5*  --   LATICACIDVEN  --  1.8    Liver Function Tests: Recent Labs  Lab 04/06/24 1430  AST 298*  ALT 390*  ALKPHOS 169*  BILITOT 3.0*  PROT 7.3  ALBUMIN  3.2*   No results for input(s): LIPASE, AMYLASE in the last 168 hours. No results for input(s): AMMONIA in the last 168 hours.  ABG    Component Value Date/Time   PHART 7.453 (H) 01/30/2024 1036   PCO2ART 34.4 01/30/2024 1036   PO2ART 117 (H) 01/30/2024 1036   HCO3 23.9 01/30/2024 1036   TCO2 25 01/30/2024 1036   ACIDBASEDEF 4.0 (H) 01/29/2024 2050   O2SAT 70 02/04/2024 0448      Coagulation Profile: Recent Labs  Lab 04/06/24 2317  INR 1.2    Cardiac Enzymes: Recent Labs  Lab 04/06/24 1430  CKTOTAL 855*    HbA1C: Hgb A1c MFr Bld  Date/Time Value Ref Range Status  01/24/2024 04:16 AM 5.6 4.8 - 5.6 % Final    Comment:    (NOTE) Pre diabetes:          5.7%-6.4%  Diabetes:              >6.4%  Glycemic control for   <7.0% adults with diabetes     CBG: No results for input(s): GLUCAP in the last 168 hours.  Review of Systems:   ***  Past Medical History:  She,  has a past medical history of Anxiety, Depression, and Hypothyroidism.   Surgical History:   Past Surgical History:  Procedure Laterality Date  . CHOLECYSTECTOMY    . CORONARY ARTERY BYPASS GRAFT N/A 01/27/2024   Procedure: CORONARY ARTERY BYPASS GRAFTING X 4, USING LEFT INTERNAL MAMMARY ARTERY AND RIGHT ENDOSCOPIC HARVESTED GREATER SAPHENOUS VEIN;  Surgeon: Kerrin Elspeth BROCKS, MD;  Location: [facility] OR;  Service: Open Heart Surgery;  Laterality: N/A;  . IR THORACENTESIS ASP PLEURAL SPACE W/IMG GUIDE  02/07/2024  . LEFT HEART CATH AND CORONARY ANGIOGRAPHY N/A 01/24/2024   Procedure: LEFT HEART CATH AND CORONARY ANGIOGRAPHY;  Surgeon: Ladona Heinz, MD;  Location: [facility] INVASIVE CV LAB;  Service: Cardiovascular;  Laterality: N/A;  . TEE WITHOUT CARDIOVERSION N/A 01/27/2024   Procedure: ECHOCARDIOGRAM, TRANSESOPHAGEAL;  Surgeon: Kerrin Elspeth BROCKS, MD;  Location: [facility] OR;  Service: Open Heart Surgery;  Laterality: N/A;     Social History:   reports that she has never smoked. She has never used smokeless tobacco. She reports that she does not drink alcohol and does not use drugs.   Family History:  Her family history includes Stroke in her father.   Allergies Allergies  Allergen Reactions  . Naprosyn  [Naproxen ]     Upset stomach   . Latex Rash     Home Medications  Prior to Admission medications   Medication Sig Start Date End Date Taking? Authorizing Provider  acetaminophen   (TYLENOL ) 325 MG tablet Take 2 tablets (650 mg total) by mouth every 6 (six) hours as needed for mild pain (pain score 1-3). 02/15/24   Raguel Con RAMAN, PA-C  ALPRAZolam  (XANAX ) 1 MG tablet Take 1 tablet (1 mg total) by mouth See admin instructions. Take 1mg  (1 tablet) by mouth every night and 1mg  (1 tablet) during the day if needed. 02/15/24   Raguel Con RAMAN, PA-C  aspirin  EC 81 MG tablet Take 1 tablet (81 mg total) by mouth daily. Swallow whole. 02/15/24   Raguel Con RAMAN, PA-C  atorvastatin  (LIPITOR ) 80 MG tablet Take 1 tablet (80 mg total) by mouth daily at 6 PM. 03/28/24   Williams, Evan, PA-C  B Complex-C (B-COMPLEX WITH VITAMIN C) tablet Take 1 tablet by mouth daily.    [provider]  cholecalciferol  (  VITAMIN D ) 1000 UNITS tablet Take 1,000 Units by mouth daily.    [provider]  Cinnamon 500 MG capsule Take 500 mg by mouth daily.    [provider]  clopidogrel  (PLAVIX ) 75 MG tablet Take 1 tablet (75 mg total) by mouth daily. 03/28/24   Trudy Birmingham, PA-C  hydrOXYzine  (ATARAX ) 10 MG tablet Take 10 mg by mouth at bedtime. 01/03/24   [provider]  Anselm Oil 1000 MG CAPS Take 1 capsule by mouth daily.    [provider]  latanoprost  (XALATAN ) 0.005 % ophthalmic solution Place 1 drop into both eyes at bedtime. 09/16/23   [provider]  levothyroxine  (SYNTHROID ) 88 MCG tablet Take 1 tablet (88 mcg total) by mouth daily. 03/26/24   Williams, Evan, PA-C  loperamide  (IMODIUM ) 1 MG/5ML solution Take 5 mLs by mouth daily as needed for diarrhea or loose stools.    [provider]  metoprolol  tartrate (LOPRESSOR ) 25 MG tablet Take 0.5 tablets (12.5 mg total) by mouth 2 (two) times daily. 03/28/24   Trudy Birmingham, PA-C  Omega-3 Fatty Acids (FISH OIL) 1000 MG CPDR Take 1 capsule by mouth 2 (two) times daily.    [provider]  pantoprazole  (PROTONIX ) 40 MG tablet Take 1 tablet (40 mg total) by mouth daily. 03/28/24   Williams,  Evan, PA-C  Polyethyl Glycol-Propyl Glycol (SYSTANE FREE OP) Place 1 drop into both eyes 2 (two) times daily.    [provider]  Red Yeast Rice 600 MG CAPS Take 2 capsules by mouth 2 (two) times daily.    [provider]  traMADol  (ULTRAM ) 50 MG tablet Take 1 tablet (50 mg total) by mouth every 6 (six) hours as needed for moderate pain (pain score 4-6). 02/15/24   Raguel Con RAMAN, PA-C  vitamin C (ASCORBIC ACID) 500 MG tablet Take 500 mg by mouth daily.    [provider]     Critical care time: ***

## 2024-04-06 NOTE — ED Notes (Signed)
 Assuming care of this pt at this time. Report received from [place], [place]

## 2024-04-06 NOTE — ED Provider Notes (Signed)
 Received patient in turnover from Dr. Garrick.  Please see their note for further details of Hx, PE.  Briefly patient is a 78 y.o. female with a Fall .  Found on ground.  Wants to go home.  Plan for labs, CT imaging.  I reassessed the patient to he is quite confused.  She has trouble forming complete sentences.  Has some noted bruising to her right hip we will obtain a plain film.  CT imaging without obvious acute intracranial pathology.  Plain film of the right hip independently interpreted by me without fracture.  Awaiting a UA.  Unfortunately the patient had urinary events and also moved her bowels.  She continues to be confused despite being observed here in the ER for about 6 hours.  I did discuss with the patient's daughter who tells me that she is normally awake and alert and conversant and able to take care of herself independently at home.  I feel she is likely far from her baseline.  I discussed the case with the hospitalist for admission.    Emil Share, DO 04/06/24 2213

## 2024-04-06 NOTE — Plan of Care (Addendum)
 Has been evaluated patient bedside 3 times.  Note to ED via EMS patient found hypotensive received 2 L of LR bolus and while patient in the ED received 2.5 L of NS bolus currently on maintenance fluid blood pressure is still soft and MAP is barely 59- 66.  Also gave albumin  25 g and midodrine  10 mg.  Concern for persistent hypotension in the setting of home blood pressure medication side effect/underlying hypothyroidism/adrenal insufficiency.  If patient remains hypotensive persistently need to call ICU to start pressor support.   Shams Fill, MD [facility] 04/06/2024, 10:52 PM

## 2024-04-06 NOTE — ED Provider Notes (Signed)
 [place] EMERGENCY DEPARTMENT AT [facility] Provider Note   CSN: [id number] Arrival date & time: 04/06/24  1415     Patient presents with: Kaitlyn Wright is a 78 y.o. female.   HPI Patient arrives via EMS with concern for altered mental status.  Patient cannot provide any reliable history does offer speech, though not appropriately to questions, not oriented to self, place, time.  Per EMS patient had cardiac event earlier this year.  She was seen by family members or friends 3 days ago, then today found on the ground, with a malodorous smell, confused.  EMS reports patient was mildly hypotensive otherwise hemodynamically unremarkable in transport.  She initially did not want to come to the hospital, but was convinced to do so.    Prior to Admission medications   Medication Sig Start Date End Date Taking? Authorizing Provider  acetaminophen  (TYLENOL ) 325 MG tablet Take 2 tablets (650 mg total) by mouth every 6 (six) hours as needed for mild pain (pain score 1-3). 02/15/24   Raguel Con RAMAN, PA-C  ALPRAZolam  (XANAX ) 1 MG tablet Take 1 tablet (1 mg total) by mouth See admin instructions. Take 1mg  (1 tablet) by mouth every night and 1mg  (1 tablet) during the day if needed. 02/15/24   Raguel Con RAMAN, PA-C  aspirin  EC 81 MG tablet Take 1 tablet (81 mg total) by mouth daily. Swallow whole. 02/15/24   Raguel Con RAMAN, PA-C  atorvastatin  (LIPITOR ) 80 MG tablet Take 1 tablet (80 mg total) by mouth daily at 6 PM. 03/28/24   Williams, Evan, PA-C  B Complex-C (B-COMPLEX WITH VITAMIN C) tablet Take 1 tablet by mouth daily.    [provider]  cholecalciferol  (VITAMIN D ) 1000 UNITS tablet Take 1,000 Units by mouth daily.    [provider]  Cinnamon 500 MG capsule Take 500 mg by mouth daily.    [provider]  clopidogrel  (PLAVIX ) 75 MG tablet Take 1 tablet (75 mg total) by mouth daily. 03/28/24   Trudy Birmingham, PA-C  hydrOXYzine  (ATARAX ) 10 MG tablet  Take 10 mg by mouth at bedtime. 01/03/24   [provider]  Anselm Oil 1000 MG CAPS Take 1 capsule by mouth daily.    [provider]  latanoprost  (XALATAN ) 0.005 % ophthalmic solution Place 1 drop into both eyes at bedtime. 09/16/23   [provider]  levothyroxine  (SYNTHROID ) 88 MCG tablet Take 1 tablet (88 mcg total) by mouth daily. 03/26/24   Williams, Evan, PA-C  loperamide  (IMODIUM ) 1 MG/5ML solution Take 5 mLs by mouth daily as needed for diarrhea or loose stools.    [provider]  metoprolol  tartrate (LOPRESSOR ) 25 MG tablet Take 0.5 tablets (12.5 mg total) by mouth 2 (two) times daily. 03/28/24   Trudy Birmingham, PA-C  Omega-3 Fatty Acids (FISH OIL) 1000 MG CPDR Take 1 capsule by mouth 2 (two) times daily.    [provider]  pantoprazole  (PROTONIX ) 40 MG tablet Take 1 tablet (40 mg total) by mouth daily. 03/28/24   Williams, Evan, PA-C  Polyethyl Glycol-Propyl Glycol (SYSTANE FREE OP) Place 1 drop into both eyes 2 (two) times daily.    [provider]  Red Yeast Rice 600 MG CAPS Take 2 capsules by mouth 2 (two) times daily.    [provider]  traMADol  (ULTRAM ) 50 MG tablet Take 1 tablet (50 mg total) by mouth every 6 (six) hours as needed for moderate pain (pain score 4-6). 02/15/24  Raguel Benders S, PA-C  vitamin C (ASCORBIC ACID) 500 MG tablet Take 500 mg by mouth daily.    [provider]    Allergies: Naprosyn  [naproxen ] and Latex    Review of Systems  Updated Vital Signs BP (!) 99/59   Pulse 74   Resp 19   Ht 5' 3 (1.6 m)   Wt 52.2 kg   SpO2 100%   BMI 20.39 kg/m   Physical Exam Vitals and nursing note reviewed.  Constitutional:      Appearance: She is well-developed. She is ill-appearing.     Comments: Frail-appearing elderly female  HENT:     Head: Normocephalic and atraumatic.  Eyes:     Conjunctiva/sclera: Conjunctivae normal.  Cardiovascular:     Rate and Rhythm: Normal rate and regular  rhythm.  Pulmonary:     Effort: Pulmonary effort is normal. No respiratory distress.     Breath sounds: Normal breath sounds. No stridor.  Abdominal:     General: There is no distension.  Skin:    General: Skin is warm and dry.     Coloration: Skin is pale.  Neurological:     Mental Status: She is alert.     Comments: Moves all extremity spontaneously, tries to answer questions seemingly, but inconsistently offers responses.     (all labs ordered are listed, but only abnormal results are displayed) Labs Reviewed  COMPREHENSIVE METABOLIC PANEL WITH GFR - Abnormal; Notable for the following components:      Result Value   Chloride 96 (*)    Glucose, Bld 127 (*)    Albumin  3.2 (*)    AST 298 (*)    ALT 390 (*)    Alkaline Phosphatase 169 (*)    Total Bilirubin 3.0 (*)    All other components within normal limits  CBC WITH DIFFERENTIAL/PLATELET - Abnormal; Notable for the following components:   WBC 16.5 (*)    Neutro Abs 14.6 (*)    Monocytes Absolute 1.1 (*)    Abs Immature Granulocytes 0.08 (*)    All other components within normal limits  CK - Abnormal; Notable for the following components:   Total CK 855 (*)    All other components within normal limits  CULTURE, BLOOD (ROUTINE X 2)  CULTURE, BLOOD (ROUTINE X 2)  URINALYSIS, W/ REFLEX TO CULTURE (INFECTION SUSPECTED)  PROTIME-INR  I-STAT CG4 LACTIC ACID, ED    EKG: EKG Interpretation Date/Time:  Friday April 06 2024 14:44:36 EDT Ventricular Rate:  76 PR Interval:  144 QRS Duration:  91 QT Interval:  511 QTC Calculation: 575 R Axis:   31  Text Interpretation: Sinus rhythm Low voltage, extremity leads Prolonged QT interval Confirmed by Garrick Charleston [phone]) on 04/06/2024 2:49:33 PM  Radiology: [facility] Chest Port 1 View Result Date: 04/06/2024 CLINICAL DATA:  Questionable sepsis.  Found down. EXAM: PORTABLE CHEST 1 VIEW COMPARISON:  03/20/2024. FINDINGS: Trachea is midline. Heart size stable. Thoracic aorta is  calcified. Biapical pleural thickening. No airspace consolidation. There may be trace left pleural fluid. Chronically elevated left hemidiaphragm. IMPRESSION: Possible trace left pleural fluid. Electronically Signed   By: Newell Eke M.D.   On: 04/06/2024 14:48     Procedures   Medications Ordered in the ED  lactated ringers  bolus 1,000 mL (1,000 mLs Intravenous New Bag/Given 04/06/24 1501)  Medical Decision Making Elderly female presents after being found down for 3 days.  Patient is grossly cephalopathic, dehydration versus infection versus bacteremia, sepsis, head trauma, intracranial abnormality.   Amount and/or Complexity of Data Reviewed Independent Historian: EMS External Data Reviewed: notes. Labs: ordered. Radiology: ordered and independent interpretation performed. ECG/medicine tests: ordered and independent interpretation performed. Decision-making details documented in ED Course.   Patient in similar condition.  Heart rate now 80 sinus normal pulse ox 100% room air normal labs imaging repeat evaluation all pending.  Final diagnoses:  Delirium     Garrick Charleston, MD 04/06/24 1536
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
## 2024-04-06 NOTE — ED Notes (Signed)
 This RN has contact MD pertaining to pts decline, awaiting MD's answer and order. Request for MD to come and lay eyes on pt ASAP

## 2024-04-06 NOTE — Consult Note (Addendum)
 NAME:  Kaitlyn Wright, MRN:  [id number], DOB:  1946/05/27, LOS: 0 ADMISSION DATE:  04/06/2024, CONSULTATION DATE:  04/06/2024 REFERRING MD: Alfornia Madison, MD, CHIEF COMPLAINT:  hypotension    History of Present Illness:  A 78 y.o. female patient with CAD s/p CABG 01/27/2024 (aspirin , atorvastatin , clopidogrel , and metoprolol ), postop A-fib (not on [facility]), elevated LFTs (Amiodarone  was d/c March 19, 2024), anxiety, depression, hypothyroidism, and hyperlipidemia. Midodrine  was d/c March 19, 2024 due to simplify her regimen. She presented to ED with AMS and falls.  She also mentions falling against her sink a few weeks ago.  Denies chest pain, SOB, LL edema, f/c/r, or N/V/D. She has pain allover.  Per ED report, patient was last seen by a neighbor 3 days ago and then today found on the ground with a malodorous smell and was confused.  She was mildly hypotensive with EMS during transport. She received 4 L crystalloids, Midodrine  10 mg, and Albumin  25 mg (25%), yet her BP is borderline low and PCCM was consulted for eval.   Pertinent  Medical History  CAD s/p CABG 01/27/2024, postop A-fib, elevated LFTs, anxiety, depression, hypothyroidism, hyperlipidemia.  Significant Hospital Events: Including procedures, antibiotic start and stop dates in addition to other pertinent events   Chest x-ray showing possible trace left pleural effusion. CT head negative for acute intracranial abnormality. CT maxillofacial showing no evidence of acute traumatic injury. X-ray of right hip/pelvis negative for fracture or malalignment.  EKG showing sinus rhythm, no STEMI, QTc 575. CK, WBC, are LFT are high.   Interim History / Subjective:    Objective    Blood pressure 92/64, pulse 77, temperature 97.6 F (36.4 C), temperature source Oral, resp. rate 16, height 5' 3 (1.6 m), weight 52.2 kg, SpO2 100%.        Intake/Output Summary (Last 24 hours) at 04/06/2024 2348 Last data filed at 04/06/2024 2333 Gross per 24 hour   Intake 2291.48 ml  Output --  Net 2291.48 ml   Filed Weights   04/06/24 1444  Weight: 52.2 kg    Examination: General: alert, oriented year and place. Comfortable. On 2 L [place]. SpO2 99%  HENT: PERL, dry oral mucosa. No LNE or thyromegaly. No JVD Lungs: symmetrical air entry bilaterally. No crackles or wheezing Cardiovascular: NL S1/S2. No m/g/r Abdomen: no distension or ascites. Right flank bruise and tenderness.  Extremities: no edema. Symmetrical. No hip painful movement Neuro: nonfocal   Resolved problem list   Assessment and Plan  Rhabdomyolysis due to recent fall Frequent falls probably due to orthostatic hypotension Elevated LFTs due to rhabdomyolysis and meds like Lipitor  and previously Amiodarone  -Admit to ICU -IVF -UA -f/u Cx -PCT -Trop -One dose of Rocephin  pending UA and PCT -I/O chart -Repeat LA -f/u CK -PT/OT: walk with a walker and from chair to chair in her living room but experiences back pain, which she attributes to not being able to visit her chiropractor since surgery.   Metabolic encephalopathy -Ammonia -ABG   CAD s/p CABG 01/27/2024.  May 2025, Echo EF 60% Postop Afib: in sinus  Hyperlipidemia -Resume aspirin  -Hold clopidogrel , atorvastatin , and metoprolol   Anxiety/depression -No home meds  Hypothyroidism -TFT -Resume home Levothyroxine    GERD -Resume home PPI  Best Practice (right click and Reselect all SmartList Selections daily)   Diet/type: Regular consistency (see orders) DVT prophylaxis prophylactic heparin   Pressure ulcer(s): N/A GI prophylaxis: N/A Resume home PPI Lines: N/A Foley:  N/A Code Status:  full code Last date of multidisciplinary  goals of care discussion []   Labs   CBC: Recent Labs  Lab 04/06/24 1430  WBC 16.5*  NEUTROABS 14.6*  HGB 13.5  HCT 39.6  MCV 91.0  PLT 332    Basic Metabolic Panel: Recent Labs  Lab 04/06/24 1430  NA 138  K 4.1  CL 96*  CO2 28  GLUCOSE 127*  BUN 11  CREATININE  0.89  CALCIUM  9.2   GFR: Estimated Creatinine Clearance: 41.5 mL/min (by C-G formula based on SCr of 0.89 mg/dL). Recent Labs  Lab 04/06/24 1430 04/06/24 1442  WBC 16.5*  --   LATICACIDVEN  --  1.8    Liver Function Tests: Recent Labs  Lab 04/06/24 1430  AST 298*  ALT 390*  ALKPHOS 169*  BILITOT 3.0*  PROT 7.3  ALBUMIN  3.2*   No results for input(s): LIPASE, AMYLASE in the last 168 hours. No results for input(s): AMMONIA in the last 168 hours.  ABG    Component Value Date/Time   PHART 7.453 (H) 01/30/2024 1036   PCO2ART 34.4 01/30/2024 1036   PO2ART 117 (H) 01/30/2024 1036   HCO3 23.9 01/30/2024 1036   TCO2 25 01/30/2024 1036   ACIDBASEDEF 4.0 (H) 01/29/2024 2050   O2SAT 70 02/04/2024 0448     Coagulation Profile: Recent Labs  Lab 04/06/24 2317  INR 1.2    Cardiac Enzymes: Recent Labs  Lab 04/06/24 1430  CKTOTAL 855*    HbA1C: Hgb A1c MFr Bld  Date/Time Value Ref Range Status  01/24/2024 04:16 AM 5.6 4.8 - 5.6 % Final    Comment:    (NOTE) Pre diabetes:          5.7%-6.4%  Diabetes:              >6.4%  Glycemic control for   <7.0% adults with diabetes     CBG: No results for input(s): GLUCAP in the last 168 hours.  Review of Systems:   Review of Systems  Constitutional:  Positive for malaise/fatigue. Negative for chills, diaphoresis and fever.  HENT:  Negative for congestion, sinus pain and sore throat.   Eyes:  Negative for blurred vision and double vision.  Respiratory:  Negative for cough, hemoptysis, sputum production, shortness of breath, wheezing and stridor.   Cardiovascular:  Negative for chest pain, palpitations, orthopnea, claudication, leg swelling and PND.  Gastrointestinal:  Negative for abdominal pain, blood in stool, diarrhea, heartburn, melena, nausea and vomiting.  Genitourinary:  Negative for dysuria, frequency and urgency.  Musculoskeletal:  Positive for back pain and falls. Negative for joint pain, myalgias  and neck pain.  Neurological:  Positive for dizziness. Negative for sensory change, focal weakness and headaches.     Past Medical History:  She,  has a past medical history of Anxiety, Depression, and Hypothyroidism.   Surgical History:   Past Surgical History:  Procedure Laterality Date   CHOLECYSTECTOMY     CORONARY ARTERY BYPASS GRAFT N/A 01/27/2024   Procedure: CORONARY ARTERY BYPASS GRAFTING X 4, USING LEFT INTERNAL MAMMARY ARTERY AND RIGHT ENDOSCOPIC HARVESTED GREATER SAPHENOUS VEIN;  Surgeon: Kerrin Elspeth BROCKS, MD;  Location: [facility] OR;  Service: Open Heart Surgery;  Laterality: N/A;   IR THORACENTESIS ASP PLEURAL SPACE W/IMG GUIDE  02/07/2024   LEFT HEART CATH AND CORONARY ANGIOGRAPHY N/A 01/24/2024   Procedure: LEFT HEART CATH AND CORONARY ANGIOGRAPHY;  Surgeon: Ladona Heinz, MD;  Location: [facility] INVAS[facility];  Service: Cardiovascular;  Laterality: N/A;   TEE WITHOUT CARDIOVERSION N/A 01/27/2024   Procedure:  ECHOCARDIOGRAM, TRANSESOPHAGEAL;  Surgeon: Kerrin Elspeth BROCKS, MD;  Location: [facility] OR;  Service: Open Heart Surgery;  Laterality: N/A;     Social History:   reports that she has never smoked. She has never used smokeless tobacco. She reports that she does not drink alcohol and does not use drugs.   Family History:  Her family history includes Stroke in her father.   Allergies Allergies  Allergen Reactions   Naprosyn  [Naproxen ]     Upset stomach    Latex Rash     Home Medications  Prior to Admission medications   Medication Sig Start Date End Date Taking? Authorizing Provider  acetaminophen  (TYLENOL ) 325 MG tablet Take 2 tablets (650 mg total) by mouth every 6 (six) hours as needed for mild pain (pain score 1-3). 02/15/24   Raguel Con RAMAN, PA-C  ALPRAZolam  (XANAX ) 1 MG tablet Take 1 tablet (1 mg total) by mouth See admin instructions. Take 1mg  (1 tablet) by mouth every night and 1mg  (1 tablet) during the day if needed. 02/15/24   Raguel Con RAMAN, PA-C  aspirin  EC 81  MG tablet Take 1 tablet (81 mg total) by mouth daily. Swallow whole. 02/15/24   Raguel Con RAMAN, PA-C  atorvastatin  (LIPITOR ) 80 MG tablet Take 1 tablet (80 mg total) by mouth daily at 6 PM. 03/28/24   Williams, Evan, PA-C  B Complex-C (B-COMPLEX WITH VITAMIN C) tablet Take 1 tablet by mouth daily.    [provider]  cholecalciferol  (VITAMIN D ) 1000 UNITS tablet Take 1,000 Units by mouth daily.    [provider]  Cinnamon 500 MG capsule Take 500 mg by mouth daily.    [provider]  clopidogrel  (PLAVIX ) 75 MG tablet Take 1 tablet (75 mg total) by mouth daily. 03/28/24   Williams, Evan, PA-C  hydrOXYzine  (ATARAX ) 10 MG tablet Take 10 mg by mouth at bedtime. 01/03/24   [provider]  Anselm Oil 1000 MG CAPS Take 1 capsule by mouth daily.    [provider]  latanoprost  (XALATAN ) 0.005 % ophthalmic solution Place 1 drop into both eyes at bedtime. 09/16/23   [provider]  levothyroxine  (SYNTHROID ) 88 MCG tablet Take 1 tablet (88 mcg total) by mouth daily. 03/26/24   Williams, Evan, PA-C  loperamide  (IMODIUM ) 1 MG/5ML solution Take 5 mLs by mouth daily as needed for diarrhea or loose stools.    [provider]  metoprolol  tartrate (LOPRESSOR ) 25 MG tablet Take 0.5 tablets (12.5 mg total) by mouth 2 (two) times daily. 03/28/24   Trudy Birmingham, PA-C  Omega-3 Fatty Acids (FISH OIL) 1000 MG CPDR Take 1 capsule by mouth 2 (two) times daily.    [provider]  pantoprazole  (PROTONIX ) 40 MG tablet Take 1 tablet (40 mg total) by mouth daily. 03/28/24   Williams, Evan, PA-C  Polyethyl Glycol-Propyl Glycol (SYSTANE FREE OP) Place 1 drop into both eyes 2 (two) times daily.    [provider]  Red Yeast Rice 600 MG CAPS Take 2 capsules by mouth 2 (two) times daily.    [provider]  traMADol  (ULTRAM ) 50 MG tablet Take 1 tablet (50 mg total) by mouth every 6 (six) hours as needed for moderate pain (pain score 4-6). 02/15/24    Raguel Con RAMAN, PA-C  vitamin C (ASCORBIC ACID) 500 MG tablet Take 500 mg by mouth daily.    [provider]     Critical care time: 52 min     Mancel Ply, MD [place]  Pulmonary and Critical Care Medicine Pager: see AMION

## 2024-04-06 NOTE — ED Notes (Signed)
 Pt is currently hypotensive(83/44). MD [place] made aware, ordered
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
## 2024-04-07 ENCOUNTER — Inpatient Hospital Stay (HOSPITAL_COMMUNITY)

## 2024-04-07 DIAGNOSIS — G9341 Metabolic encephalopathy: Secondary | ICD-10-CM | POA: Diagnosis not present

## 2024-04-07 DIAGNOSIS — D72829 Elevated white blood cell count, unspecified: Secondary | ICD-10-CM | POA: Diagnosis not present

## 2024-04-07 DIAGNOSIS — R55 Syncope and collapse: Secondary | ICD-10-CM | POA: Diagnosis not present

## 2024-04-07 DIAGNOSIS — M6282 Rhabdomyolysis: Secondary | ICD-10-CM | POA: Diagnosis not present

## 2024-04-07 DIAGNOSIS — I951 Orthostatic hypotension: Secondary | ICD-10-CM | POA: Diagnosis not present

## 2024-04-07 LAB — COMPREHENSIVE METABOLIC PANEL WITH GFR
ALT: 236 U/L — ABNORMAL HIGH (ref 0–44)
AST: 176 U/L — ABNORMAL HIGH (ref 15–41)
Albumin: 3.1 g/dL — ABNORMAL LOW (ref 3.5–5.0)
Alkaline Phosphatase: 122 U/L (ref 38–126)
Anion gap: 12 (ref 5–15)
BUN: 12 mg/dL (ref 8–23)
CO2: 24 mmol/L (ref 22–32)
Calcium: 8.2 mg/dL — ABNORMAL LOW (ref 8.9–10.3)
Chloride: 104 mmol/L (ref 98–111)
Creatinine, Ser: 0.72 mg/dL (ref 0.44–1.00)
GFR, Estimated: >60 mL/min (ref >60)
Glucose, Bld: 105 mg/dL — ABNORMAL HIGH (ref 70–99)
Potassium: 3.4 mmol/L — ABNORMAL LOW (ref 3.5–5.1)
Sodium: 140 mmol/L (ref 135–145)
Total Bilirubin: 2.1 mg/dL — ABNORMAL HIGH (ref 0.0–1.2)
Total Protein: 5.7 g/dL — ABNORMAL LOW (ref 6.5–8.1)

## 2024-04-07 LAB — CBC
HCT: 33.7 % — ABNORMAL LOW (ref 36.0–46.0)
HCT: 35.7 % — ABNORMAL LOW (ref 36.0–46.0)
Hemoglobin: 11 g/dL — ABNORMAL LOW (ref 12.0–15.0)
Hemoglobin: 11.9 g/dL — ABNORMAL LOW (ref 12.0–15.0)
MCH: 30.3 pg (ref 26.0–34.0)
MCH: 30.7 pg (ref 26.0–34.0)
MCHC: 32.6 g/dL (ref 30.0–36.0)
MCHC: 33.3 g/dL (ref 30.0–36.0)
MCV: 92 fL (ref 80.0–100.0)
MCV: 92.8 fL (ref 80.0–100.0)
Platelets: 235 K/uL (ref 150–400)
Platelets: 271 K/uL (ref 150–400)
RBC: 3.63 MIL/uL — ABNORMAL LOW (ref 3.87–5.11)
RBC: 3.88 MIL/uL (ref 3.87–5.11)
RDW: 15.6 % — ABNORMAL HIGH (ref 11.5–15.5)
RDW: 15.6 % — ABNORMAL HIGH (ref 11.5–15.5)
WBC: 14.7 K/uL — ABNORMAL HIGH (ref 4.0–10.5)
WBC: 15.3 K/uL — ABNORMAL HIGH (ref 4.0–10.5)
nRBC: 0 % (ref 0.0–0.2)
nRBC: 0 % (ref 0.0–0.2)

## 2024-04-07 LAB — CK: Total CK: 843 U/L — ABNORMAL HIGH (ref 38–234)

## 2024-04-07 LAB — BLOOD CULTURE ID PANEL (REFLEXED) - BCID2

## 2024-04-07 LAB — VITAMIN B12: Vitamin B-12: 2251 pg/mL — ABNORMAL HIGH (ref 180–914)

## 2024-04-07 LAB — MAGNESIUM: Magnesium: 1.6 mg/dL — ABNORMAL LOW (ref 1.7–2.4)

## 2024-04-07 LAB — ECHOCARDIOGRAM COMPLETE
AR max vel: 2.2 cm2
AV Area VTI: 2.43 cm2
AV Area mean vel: 2.06 cm2
AV Mean grad: 3 mmHg
AV Peak grad: 4.6 mmHg
Ao pk vel: 1.07 m/s
Area-P 1/2: 6.27 cm2
Height: 63 in
S' Lateral: 2.6 cm
Weight: 1841.28 [oz_av]

## 2024-04-07 LAB — AMMONIA: Ammonia: 29 umol/L (ref 9–35)

## 2024-04-07 LAB — I-STAT ARTERIAL BLOOD GAS, ED
Acid-Base Excess: 1 mmol/L (ref 0.0–2.0)
Bicarbonate: 24.9 mmol/L (ref 20.0–28.0)
Calcium, Ion: 1.09 mmol/L — ABNORMAL LOW (ref 1.15–1.40)
HCT: 29 % — ABNORMAL LOW (ref 36.0–46.0)
Hemoglobin: 9.9 g/dL — ABNORMAL LOW (ref 12.0–15.0)
O2 Saturation: 97 %
Patient temperature: 98.6
Potassium: 3.1 mmol/L — ABNORMAL LOW (ref 3.5–5.1)
Sodium: 140 mmol/L (ref 135–145)
TCO2: 26 mmol/L (ref 22–32)
pCO2 arterial: 36.6 mmHg (ref 32–48)
pH, Arterial: 7.44 (ref 7.35–7.45)
pO2, Arterial: 87 mmHg (ref 83–108)

## 2024-04-07 LAB — HEPATITIS PANEL, ACUTE
HCV Ab: NONREACTIVE
Hep A IgM: NONREACTIVE
Hep B C IgM: NONREACTIVE
Hepatitis B Surface Ag: NONREACTIVE

## 2024-04-07 LAB — FOLATE: Folate: 9 ng/mL (ref >5.9)

## 2024-04-07 LAB — PROCALCITONIN: Procalcitonin: 0.13 ng/mL

## 2024-04-07 LAB — T4, FREE: Free T4: 1 ng/dL (ref 0.61–1.12)

## 2024-04-07 LAB — CORTISOL-AM, BLOOD: Cortisol - AM: 42.1 ug/dL — ABNORMAL HIGH (ref 6.7–22.6)

## 2024-04-07 LAB — TROPONIN I (HIGH SENSITIVITY): Troponin I (High Sensitivity): 97 ng/L — ABNORMAL HIGH (ref <18)

## 2024-04-07 LAB — LACTIC ACID, PLASMA: Lactic Acid, Venous: 1.3 mmol/L (ref 0.5–1.9)

## 2024-04-07 LAB — CREATININE, SERUM
Creatinine, Ser: 0.66 mg/dL (ref 0.44–1.00)
GFR, Estimated: >60 mL/min (ref >60)

## 2024-04-07 LAB — MRSA NEXT GEN BY PCR, NASAL: MRSA by PCR Next Gen: NOT DETECTED

## 2024-04-07 MED ORDER — CHLORHEXIDINE GLUCONATE CLOTH 2 % EX PADS
6.0000 | MEDICATED_PAD | Freq: Every day | CUTANEOUS | Status: DC
  Administered 2024-04-07 – 2024-04-14 (×8): 6 via TOPICAL

## 2024-04-07 MED ORDER — SODIUM CHLORIDE 0.9 % IV SOLN: 250.0000 mL | INTRAVENOUS | Status: AC

## 2024-04-07 MED ORDER — MAGNESIUM SULFATE 2 GM/50ML IV SOLN
2.0000 g | Freq: Once | INTRAVENOUS | Status: AC
  Administered 2024-04-07: 2 g via INTRAVENOUS
  Filled 2024-04-07: qty 50

## 2024-04-07 MED ORDER — POTASSIUM CHLORIDE CRYS ER 20 MEQ PO TBCR
40.0000 meq | EXTENDED_RELEASE_TABLET | Freq: Once | ORAL | Status: AC
  Administered 2024-04-07: 40 meq via ORAL
  Filled 2024-04-07: qty 2

## 2024-04-07 MED ORDER — ALPRAZOLAM 0.25 MG PO TABS
0.2500 mg | ORAL_TABLET | Freq: Two times a day (BID) | ORAL | Status: DC | PRN
  Administered 2024-04-07 – 2024-04-15 (×9): 0.25 mg via ORAL
  Filled 2024-04-07 (×11): qty 1

## 2024-04-07 MED ORDER — NOREPINEPHRINE 4 MG/250ML-% IV SOLN
INTRAVENOUS | Status: AC
  Administered 2024-04-07: 2 ug/min via INTRAVENOUS
  Filled 2024-04-07: qty 250

## 2024-04-07 MED ORDER — NOREPINEPHRINE 4 MG/250ML-% IV SOLN
0.0000 ug/min | INTRAVENOUS | Status: DC
  Administered 2024-04-08: 4 ug/min via INTRAVENOUS
  Administered 2024-04-08: 2 ug/min via INTRAVENOUS
  Filled 2024-04-07: qty 250

## 2024-04-07 MED ORDER — LACTATED RINGERS IV BOLUS
1000.0000 mL | Freq: Once | INTRAVENOUS | Status: AC
  Administered 2024-04-07: 1000 mL via INTRAVENOUS

## 2024-04-07 NOTE — Progress Notes (Signed)
 ABG obtained while patient was wearing oxygen set at 3lpm

## 2024-04-07 NOTE — Progress Notes (Signed)
 eLink Physician-Brief Progress Note Patient Name: Kaitlyn Wright DOB: 1945-11-20 MRN: [id number]   Date of Service  04/07/2024  HPI/Events of Note  Notified of growth of Staph epi from one site likely contaminant Clinically improving on ceftriaxone   eICU Interventions  No new antibiotics recommended by pharm at this time     Intervention Category Intermediate Interventions: Diagnostic test evaluation  Damien ONEIDA Grout 04/07/2024, 11:02 PM

## 2024-04-07 NOTE — Progress Notes (Addendum)
 NAME:  Kaitlyn Wright, MRN:  [id number], DOB:  August 02, 1946, LOS: 1 ADMISSION DATE:  04/06/2024, CONSULTATION DATE:  04/06/2024 REFERRING MD: Alfornia Madison, MD, CHIEF COMPLAINT:  hypotension    History of Present Illness:  A 78 y.o. female patient with CAD s/p CABG 01/27/2024 (aspirin , atorvastatin , clopidogrel , and metoprolol ), postop A-fib (not on [facility]), elevated LFTs (Amiodarone  was d/c March 19, 2024), anxiety, depression, hypothyroidism, and hyperlipidemia. Midodrine  was d/c March 19, 2024 due to simplify her regimen. She presented to ED with AMS and falls.  She also mentions falling against her sink a few weeks ago.  Denies chest pain, SOB, LL edema, f/c/r, or N/V/D. She has pain allover.  Per ED report, patient was last seen by a neighbor 3 days ago and then today found on the ground with a malodorous smell and was confused.  She was mildly hypotensive with EMS during transport. She received 4 L crystalloids, Midodrine  10 mg, and Albumin  25 mg (25%), yet her BP is borderline low and PCCM was consulted for eval.   Pertinent  Medical History  CAD s/p CABG 01/27/2024, postop A-fib, elevated LFTs, anxiety, depression, hypothyroidism, hyperlipidemia.   Significant Hospital Events: Including procedures, antibiotic start and stop dates in addition to other pertinent events   7/18 Admission and transfer to ICU for hypotension: Chest x-ray showing possible trace left pleural effusion. CT head negative for acute intracranial abnormality. CT maxillofacial showing no evidence of acute traumatic injury. X-ray of right hip/pelvis negative for fracture or malalignment.  EKG showing sinus rhythm, no STEMI, QTc 575. CK, WBC, are LFT are high.   Interim History / Subjective:  Aox3 this am. Answer appropriately. Loses train of thought at times. States she wants to go home.  No pressers initiated overnight. Blood pressures MAPs in 60s with midodrine  and fluids.  Objective    Blood pressure (!) 105/55, pulse 84,  temperature 98.5 F (36.9 C), temperature source Oral, resp. rate (!) 24, height 5' 3 (1.6 m), weight 52.2 kg, SpO2 93%.        Intake/Output Summary (Last 24 hours) at 04/07/2024 1009 Last data filed at 04/07/2024 0700 Gross per 24 hour  Intake 3118.97 ml  Output --  Net 3118.97 ml   Filed Weights   04/06/24 1444  Weight: 52.2 kg    Examination: General: Frail appearing woman in NAD. Comfortable. HENT: PERL, dry oral mucosa. No LNE or thyromegaly. No JVD Lungs: symmetrical air entry bilaterally. No crackles or wheezing. On RA. Cardiovascular: NL S1/S2. No m/g/r Abdomen: no distension or ascites. Right flank bruise and tenderness.  Extremities: no edema. Symmetrical. Warm extremities. No hip painful movement Neuro: AO3. Nonfocal.  Resolved problem list  Confusion, metabolic encephalopathy  Assessment and Plan   Hypotension without shock Likely dehydration with recent fall and GI illness Possible medication effect with xanax  and beta blockade Admitted to ICU overnight given persistent hypotension, Maps around 60, and with confusion at admission. Over 3L fluids provided at this point and membranes remain slightly dry. This AM, pressures remain low/normal but with resolution of encephalopathy. She is fully oriented and speaks appropriately, albeit slowly at times. No hyperlactatemia. Extremities warm with good pulses. Was on midodrine  after recent CABG but stopped in early July. - Continue IV fluid resuscitation with LR - Continue Midodrine  10 TID - FU Echo, RUQ US  - hold BB (history Afib), reduce xanax  dose from 1 to 0.25 prn - collect orthostatic vitals likely tomorrow - Am cortisol not low - If improvement through  day, may be able to leave ICU  Fall from standing, discovered on home floor Possible orthostatic hypotension with history of falls Elevated CK without AKI Events unclear as to why she was found on floor. She denies falling but admits she has fallen in the past  and cannot recall the events that led to this hospitalization. Denies dizziness on standing. Occasionally uses a walker and shares that people often help her. Lives alone. - Trend renal function - PT/OT to eval - Orthostatics when out of ICU  Leukocytosis Infectious workup not revealing. UA, CXR, Procal negative. No fevers. Could be due to her fall. Trend fevers and WBCs. Hold abx.  Elevated LFTs due to rhabdomyolysis and meds like Lipitor  and previously Amiodarone  Monitor   CAD s/p CABG 01/27/2024.  May 2025, Echo EF 60% Postop Afib: in sinus  Hyperlipidemia -Resume aspirin  -Hold clopidogrel , atorvastatin , and metoprolol  - hold BB   Anxiety/depression -Reduce xanax  per above   Hypothyroidism -Resume home Levothyroxine     GERD -Resume home PPI  Confusion, metabolic encephalopathy - Resolved No longer encephalopathic  Best Practice (right click and Reselect all SmartList Selections daily)   Diet/type: Regular consistency (see orders) DVT prophylaxis prophylactic heparin   Pressure ulcer(s): N/A GI prophylaxis: N/A Resume home PPI Lines: N/A Foley:  N/A Code Status:  full code Last date of multidisciplinary goals of care discussion []   Labs   CBC: Recent Labs  Lab 04/06/24 1430 04/07/24 0002 04/07/24 0005 04/07/24 0247  WBC 16.5*  --  14.7* 15.3*  NEUTROABS 14.6*  --   --   --   HGB 13.5 9.9* 11.0* 11.9*  HCT 39.6 29.0* 33.7* 35.7*  MCV 91.0  --  92.8 92.0  PLT 332  --  235 271   Basic Metabolic Panel: Recent Labs  Lab 04/06/24 1430 04/07/24 0002 04/07/24 0005 04/07/24 0247  NA 138 140  --  140  K 4.1 3.1*  --  3.4*  CL 96*  --   --  104  CO2 28  --   --  24  GLUCOSE 127*  --   --  105*  BUN 11  --   --  12  CREATININE 0.89  --  0.66 0.72  CALCIUM  9.2  --   --  8.2*  MG  --   --   --  1.6*   GFR: Estimated Creatinine Clearance: 46.2 mL/min (by C-G formula based on SCr of 0.72 mg/dL). Recent Labs  Lab 04/06/24 1430 04/06/24 1442  04/07/24 0005 04/07/24 0247  PROCALCITON  --   --  0.13  --   WBC 16.5*  --  14.7* 15.3*  LATICACIDVEN  --  1.8 1.3  --    Liver Function Tests: Recent Labs  Lab 04/06/24 1430 04/07/24 0247  AST 298* 176*  ALT 390* 236*  ALKPHOS 169* 122  BILITOT 3.0* 2.1*  PROT 7.3 5.7*  ALBUMIN  3.2* 3.1*   No results for input(s): LIPASE, AMYLASE in the last 168 hours. Recent Labs  Lab 04/07/24 0247  AMMONIA 29   ABG    Component Value Date/Time   PHART 7.440 04/07/2024 0002   PCO2ART 36.6 04/07/2024 0002   PO2ART 87 04/07/2024 0002   HCO3 24.9 04/07/2024 0002   TCO2 26 04/07/2024 0002   ACIDBASEDEF 4.0 (H) 01/29/2024 2050   O2SAT 97 04/07/2024 0002    Coagulation Profile: Recent Labs  Lab 04/06/24 2317  INR 1.2   Cardiac Enzymes: Recent Labs  Lab 04/06/24 1430 04/07/24 0247  CKTOTAL 855* 843*   HbA1C: Hgb A1c MFr Bld  Date/Time Value Ref Range Status  01/24/2024 04:16 AM 5.6 4.8 - 5.6 % Final    Comment:    (NOTE) Pre diabetes:          5.7%-6.4%  Diabetes:              >6.4%  Glycemic control for   <7.0% adults with diabetes    CBG: No results for input(s): GLUCAP in the last 168 hours.  Review of Systems:   Deneis pain, dyspnea, confusion, discomfort, fevers, chills. Reports intermittent diarrhea which is normal for her. Shares that she would like to leave the hospital.  Past Medical History:  She,  has a past medical history of Anxiety, Depression, and Hypothyroidism.   Surgical History:   Past Surgical History:  Procedure Laterality Date   CHOLECYSTECTOMY     CORONARY ARTERY BYPASS GRAFT N/A 01/27/2024   Procedure: CORONARY ARTERY BYPASS GRAFTING X 4, USING LEFT INTERNAL MAMMARY ARTERY AND RIGHT ENDOSCOPIC HARVESTED GREATER SAPHENOUS VEIN;  Surgeon: Kerrin Elspeth BROCKS, MD;  Location: [facility] OR;  Service: Open Heart Surgery;  Laterality: N/A;   IR THORACENTESIS ASP PLEURAL SPACE W/IMG GUIDE  02/07/2024   LEFT HEART CATH AND CORONARY ANGIOGRAPHY  N/A 01/24/2024   Procedure: LEFT HEART CATH AND CORONARY ANGIOGRAPHY;  Surgeon: Ladona Heinz, MD;  Location: [facility] INVASIVE CV LAB;  Service: Cardiovascular;  Laterality: N/A;   TEE WITHOUT CARDIOVERSION N/A 01/27/2024   Procedure: ECHOCARDIOGRAM, TRANSESOPHAGEAL;  Surgeon: Kerrin Elspeth BROCKS, MD;  Location: [facility] OR;  Service: Open Heart Surgery;  Laterality: N/A;    Social History:   reports that she has never smoked. She has never used smokeless tobacco. She reports that she does not drink alcohol and does not use drugs.   Family History:  Her family history includes Stroke in her father.   Allergies Allergies  Allergen Reactions   Naprosyn  [Naproxen ]     Upset stomach    Latex Rash    Home Medications  Prior to Admission medications   Medication Sig Start Date End Date Taking? Authorizing Provider  aspirin  EC 81 MG tablet Take 1 tablet (81 mg total) by mouth daily. Swallow whole. 02/15/24   Raguel Con RAMAN, PA-C  atorvastatin  (LIPITOR ) 80 MG tablet Take 1 tablet (80 mg total) by mouth daily at 6 PM. 03/28/24   Williams, Evan, PA-C  B Complex-C (B-COMPLEX WITH VITAMIN C) tablet Take 1 tablet by mouth daily.    [provider]  cholecalciferol  (VITAMIN D ) 1000 UNITS tablet Take 1,000 Units by mouth daily.    [provider]  Cinnamon 500 MG capsule Take 500 mg by mouth daily.    [provider]  clopidogrel  (PLAVIX ) 75 MG tablet Take 1 tablet (75 mg total) by mouth daily. 03/28/24   Williams, Evan, PA-C  cyclobenzaprine (FLEXERIL) 5 MG tablet Take 5 mg by mouth at bedtime as needed. 03/30/24   [provider]  hydrOXYzine  (ATARAX ) 10 MG tablet Take 10 mg by mouth at bedtime. 01/03/24   [provider]  Anselm Oil 1000 MG CAPS Take 1 capsule by mouth daily.    [provider]  latanoprost  (XALATAN ) 0.005 % ophthalmic solution Place 1 drop into both eyes at bedtime. 09/16/23   [provider]  levothyroxine  (SYNTHROID ) 88 MCG tablet  Take 1 tablet (88 mcg total) by mouth daily. 03/26/24   Trudy Birmingham, PA-C  loperamide  (IMODIUM ) 1 MG/5ML solution Take 5 mLs by  mouth daily as needed for diarrhea or loose stools.    [provider]  metoprolol  tartrate (LOPRESSOR ) 25 MG tablet Take 0.5 tablets (12.5 mg total) by mouth 2 (two) times daily. 03/28/24   Trudy Birmingham, PA-C  Omega-3 Fatty Acids (FISH OIL) 1000 MG CPDR Take 1 capsule by mouth 2 (two) times daily.    [provider]  ondansetron  (ZOFRAN ) 4 MG tablet Take 4 mg by mouth every 8 (eight) hours as needed for nausea or vomiting. 03/12/24   [provider]  pantoprazole  (PROTONIX ) 40 MG tablet Take 1 tablet (40 mg total) by mouth daily. 03/28/24   Williams, Evan, PA-C  Polyethyl Glycol-Propyl Glycol (SYSTANE FREE OP) Place 1 drop into both eyes 2 (two) times daily.    [provider]  Red Yeast Rice 600 MG CAPS Take 2 capsules by mouth 2 (two) times daily.    [provider]  traMADol  (ULTRAM ) 50 MG tablet Take 1 tablet (50 mg total) by mouth every 6 (six) hours as needed for moderate pain (pain score 4-6). 02/15/24   Raguel Con RAMAN, PA-C  vitamin C (ASCORBIC ACID) 500 MG tablet Take 500 mg by mouth daily.    [provider]    Critical care time: 35 mins    Lonni Africa, DO IM Resident PGY-2

## 2024-04-07 NOTE — Evaluation (Signed)
 Physical Therapy Evaluation Patient Details Name: Kaitlyn Wright MRN: [id number] DOB: 11-28-1945 Today's Date: 04/07/2024  History of Present Illness  Pt is an 78 y.o. female presenting with AMS, hypotensive 7/18 after being found down by her neighbor. Workup showed leukocytosis (14.7), lactate 1.3. CPK 855, Cr 0.66, UA with 0-5 WBC, CXR with trace L pleural effusion. PMH significant for CAD s/p CABG 5/9, afib, elevated LFTs, anxiety, depression, hypothyroidism, HLD.  Clinical Impression  Pt is poor historian however per chart lives alone.  Pt with poor safety awareness and cognition with need for constant reorientation while physically being limited by decreased strength, balance and endurance. Pt is overall min-modAx2 for all mobility for support and safety. PT will continue to follow acutely, and patient will benefit from continued inpatient follow up therapy, <3 hours/day.        If plan is discharge home, recommend the following: A lot of help with walking and/or transfers;A lot of help with bathing/dressing/bathroom;Assistance with cooking/housework;Direct supervision/assist for medications management;Direct supervision/assist for financial management;Assist for transportation;Help with stairs or ramp for entrance;Supervision due to cognitive status   Can travel by private vehicle   No    Equipment Recommendations Rolling walker (2 wheels)     Functional Status Assessment Patient has had a recent decline in their functional status and demonstrates the ability to make significant improvements in function in a reasonable and predictable amount of time.     Precautions / Restrictions Precautions Precautions: Fall;Other (comment) (delirium; presumed sternal precautions as pt s/p CABG in May per chart) Restrictions Weight Bearing Restrictions Per Provider Order: No      Mobility  Bed Mobility Overal bed mobility: Needs Assistance Bed Mobility: Supine to Sit     Supine to sit: Max  assist     General bed mobility comments: multimodal cues and significant assist for trunk management    Transfers Overall transfer level: Needs assistance Equipment used: 2 person hand held assist Transfers: Sit to/from Stand Sit to Stand: Mod assist, +2 physical assistance, +2 safety/equipment           General transfer comment: modA for steadying with sit to stand transfers from bed and low toilet.    Ambulation/Gait Ambulation/Gait assistance: Max assist, +2 physical assistance Gait Distance (Feet): 50 Feet Assistive device: 2 person hand held assist Gait Pattern/deviations: Step-through pattern, Decreased step length - right, Decreased step length - left, Shuffle, Drifts right/left, Narrow base of support Gait velocity: slowed Gait velocity interpretation: <1.31 ft/sec, indicative of household ambulator   General Gait Details: maxAx2 for support and cuing to ambulate around ICU. Pt requiring constant cuing for safety        Balance Overall balance assessment: Needs assistance Sitting-balance support: No upper extremity supported, Feet supported Sitting balance-Leahy Scale: Poor Sitting balance - Comments: CGA+   Standing balance support: No upper extremity supported, During functional activity Standing balance-Leahy Scale: Poor Standing balance comment: reliant on external support                             Pertinent Vitals/Pain Pain Assessment Pain Assessment: Faces Faces Pain Scale: Hurts even more Pain Location: bottom/peri area Pain Descriptors / Indicators: Sore Pain Intervention(s): Monitored during session    Home Living Family/patient expects to be discharged to:: Private residence Living Arrangements: Alone                 Additional Comments: Pt unable to report. Per chart, lives  alone with no STE and 1 level; walk in shower. 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    Prior Function Prior Level of Function :  Patient poor historian/Family not available                     Extremity/Trunk Assessment   Upper Extremity Assessment Upper Extremity Assessment: Defer to OT evaluation    Lower Extremity Assessment Lower Extremity Assessment: Generalized weakness       Communication   Communication Communication: No apparent difficulties    Cognition Arousal: Alert Behavior During Therapy: Impulsive   PT - Cognitive impairments: Safety/Judgement, Problem solving, Awareness, Sequencing, Orientation   Orientation impairments: Time, Situation                   PT - Cognition Comments: pt with poor recall of events leading to hospitalization Following commands: Impaired Following commands impaired: Only follows one step commands consistently     Cueing Cueing Techniques: Verbal cues, Gestural cues     General Comments General comments (skin integrity, edema, etc.): VSS on RA        Assessment/Plan    PT Assessment Patient needs continued PT services  PT Problem List Decreased strength;Decreased activity tolerance;Decreased balance;Decreased mobility;Decreased coordination;Decreased cognition;Decreased safety awareness;Decreased skin integrity;Pain       PT Treatment Interventions Gait training;Functional mobility training;Therapeutic activities;Therapeutic exercise;Balance training;Cognitive remediation;Patient/family education    PT Goals (Current goals can be found in the Care Plan section)  Acute Rehab PT Goals PT Goal Formulation: Patient unable to participate in goal setting Time For Goal Achievement: 04/21/24 Potential to Achieve Goals: Fair    Frequency Min 2X/week        AM-PAC PT 6 Clicks Mobility  Outcome Measure Help needed turning from your back to your side while in a flat bed without using bedrails?: None Help needed moving from lying on your back to sitting on the side of a flat bed without using bedrails?: A Little Help needed moving to  and from a bed to a chair (including a wheelchair)?: Total Help needed standing up from a chair using your arms (e.g., wheelchair or bedside chair)?: Total Help needed to walk in hospital room?: Total Help needed climbing 3-5 steps with a railing? : Total 6 Click Score: 11    End of Session   Activity Tolerance: Patient tolerated treatment well Patient left: in bed;with call bell/phone within reach;with bed alarm set Nurse Communication: Mobility status PT Visit Diagnosis: Unsteadiness on feet (R26.81);Other abnormalities of gait and mobility (R26.89);Muscle weakness (generalized) (M62.81);History of falling (Z91.81);Repeated falls (R29.6);Difficulty in walking, not elsewhere classified (R26.2);Adult, failure to thrive (R62.7);Pain Pain - part of body:  (periarea)    Time: 8265-8182 PT Time Calculation (min) (ACUTE ONLY): 43 min   Charges:   PT Evaluation $PT Eval Moderate Complexity: 1 Mod   PT General Charges $$ ACUTE PT VISIT: 1 Visit         Sabrina Keough B. Fleeta Lapidus PT, DPT Acute Rehabilitation Services Please use secure chat or  Call Office [phone]   Almarie KATHEE Fleeta [facility] 04/07/2024, 7:36 PM

## 2024-04-07 NOTE — Evaluation (Signed)
 Occupational Therapy Evaluation Patient Details Name: Kaitlyn Wright MRN: [id number] DOB: 12-04-1945 Today's Date: 04/07/2024   History of Present Illness   Pt is an 78 y.o. female presenting with AMS, hypotensive 7/18 after being found down by her neighbor. Workup showed leukocytosis (14.7), lactate 1.3. CPK 855, Cr 0.66, UA with 0-5 WBC, CXR with trace L pleural effusion. PMH significant for CAD s/p CABG 5/9, afib, elevated LFTs, anxiety, depression, hypothyroidism, HLD.     Clinical Impressions PTA, pt lived alone per chart; pt poor historian at time of eval and unable to report. On eval, pt requires min-mod A+2 for safety during all mobility. Pt inconsistently oriented to place, and needing max redirection from attempt to leave and go get [employer]. Pt follows one step commands with increased time and occasionally inconsistently. Will continue to follow acutely. Due to current cognitive and physical decline, Patient will benefit from continued inpatient follow up therapy, <3 hours/day      If plan is discharge home, recommend the following:   A lot of help with walking and/or transfers;A lot of help with bathing/dressing/bathroom;Assistance with cooking/housework;Direct supervision/assist for financial management;Direct supervision/assist for medications management;Assist for transportation;Help with stairs or ramp for entrance     Functional Status Assessment   Patient has had a recent decline in their functional status and demonstrates the ability to make significant improvements in function in a reasonable and predictable amount of time.     Equipment Recommendations   Other (comment) (defer)     Recommendations for Other Services   Speech consult     Precautions/Restrictions   Precautions Precautions: Fall;Other (comment) (delirium; presumed sternal precautions as pt s/p CABG in May per chart) Restrictions Weight Bearing Restrictions Per Provider Order: No      Mobility Bed Mobility Overal bed mobility: Needs Assistance Bed Mobility: Supine to Sit     Supine to sit: Max assist     General bed mobility comments: multimodal cues and significant assist for trunk management    Transfers Overall transfer level: Needs assistance Equipment used: 2 person hand held assist Transfers: Sit to/from Stand Sit to Stand: Mod assist, +2 physical assistance, +2 safety/equipment                  Balance Overall balance assessment: Needs assistance Sitting-balance support: No upper extremity supported, Feet supported Sitting balance-Leahy Scale: Poor Sitting balance - Comments: CGA+   Standing balance support: No upper extremity supported, During functional activity Standing balance-Leahy Scale: Poor Standing balance comment: reliant on external support                           ADL either performed or assessed with clinical judgement   ADL Overall ADL's : Needs assistance/impaired Eating/Feeding: Set up;Bed level   Grooming: Minimal assistance;Sitting                   Toilet Transfer: Moderate assistance;+2 for safety/equipment;+2 for physical assistance;Ambulation           Functional mobility during ADLs: Minimal assistance;Moderate assistance;+2 for physical assistance;+2 for safety/equipment General ADL Comments: bilat HHA     Vision Patient Visual Report: No change from baseline       Perception         Praxis         Pertinent Vitals/Pain Pain Assessment Pain Assessment: Faces Faces Pain Scale: Hurts even more Pain Location: bottom/peri area Pain Descriptors / Indicators: Sore Pain Intervention(s):  Monitored during session     Extremity/Trunk Assessment Upper Extremity Assessment Upper Extremity Assessment: Generalized weakness   Lower Extremity Assessment Lower Extremity Assessment: Defer to PT evaluation       Communication Communication Communication: No apparent difficulties    Cognition Arousal: Alert Behavior During Therapy: Impulsive Cognition: Cognition impaired   Orientation impairments: Situation, Time, Place Awareness: Intellectual awareness impaired, Online awareness impaired Memory impairment (select all impairments): Short-term memory, Working memory Attention impairment (select first level of impairment): Focused attention, Sustained attention Executive functioning impairment (select all impairments): Organization, Problem solving, Sequencing, Reasoning OT - Cognition Comments: pt with inconsistent orientation and quickly jumps from one topic to the next                 Following commands: Impaired       Cueing  General Comments   Cueing Techniques: Verbal cues;Gestural cues      Exercises     Shoulder Instructions      Home Living Family/patient expects to be discharged to:: Private residence Living Arrangements: Alone                               Additional Comments: Pt unable to report. Per chart, lives alone with no STE and 1 level; walk in shower. 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      Prior Functioning/Environment Prior Level of Function : Patient poor historian/Family not available                    OT Problem List: Decreased strength;Decreased activity tolerance;Impaired balance (sitting and/or standing);Decreased cognition;Decreased safety awareness;Decreased knowledge of use of DME or AE;Decreased knowledge of precautions;Pain   OT Treatment/Interventions: Self-care/ADL training;Therapeutic exercise;DME and/or AE instruction;Therapeutic activities;Patient/family education;Balance training;Cognitive remediation/compensation      OT Goals(Current goals can be found in the care plan section)   Acute Rehab OT Goals Patient Stated Goal: unable OT Goal Formulation: Patient unable to participate in goal setting Time For Goal Achievement: 04/21/24 Potential to Achieve Goals:  Fair   OT Frequency:  Min 2X/week    Co-evaluation              AM-PAC OT 6 Clicks Daily Activity     Outcome Measure Help from another person eating meals?: A Little Help from another person taking care of personal grooming?: A Little Help from another person toileting, which includes using toliet, bedpan, or urinal?: A Lot Help from another person bathing (including washing, rinsing, drying)?: A Lot Help from another person to put on and taking off regular upper body clothing?: A Lot Help from another person to put on and taking off regular lower body clothing?: A Lot 6 Click Score: 14   End of Session Equipment Utilized During Treatment: Gait belt Nurse Communication: Mobility status  Activity Tolerance: Patient tolerated treatment well Patient left: in bed;with call bell/phone within reach;with bed alarm set  OT Visit Diagnosis: Unsteadiness on feet (R26.81);Muscle weakness (generalized) (M62.81);Pain;Other symptoms and signs involving cognitive function                Time: 1734-1816 OT Time Calculation (min): 42 min Charges:  OT General Charges $OT Visit: 1 Visit OT Evaluation $OT Eval Moderate Complexity: 1 Mod OT Treatments $Self Care/Home Management : 8-22 mins  Elma JONETTA Lebron FREDERICK, OTR/L [facility] Acute Rehabilitation Office: [phone]   Elma JONETTA Lebron 04/07/2024, 6:39 PM

## 2024-04-07 NOTE — Progress Notes (Signed)
 eLink Physician-Brief Progress Note Patient Name: MAYTE DIERS DOB: 03/25/1946 MRN: [id number]   Date of Service  04/07/2024  HPI/Events of Note  78/F, history of CABG, afib, brought to the ED after having been found on the ground by her neighbor. She was noted to be confused, hypotensive. Workup showed leukocytosis (14.7), lactate 1.3. CPK 855, Cr 0.66, UA with 0-5 WBC, CXR with trace L pleural effusion. She received 4L of IVF, midodrine , albumin  and was admitted to the ICU.   eICU Interventions  - Admit to ICU - Given multiple boluses. Will continue LR @75ml /hr. Will continue to monitor I/Os closely.  - Given empiric ceftriaxone  - will follow up infectious workup and adjust antibiotics as warranted.  - SBP 120s currently. Not on pressor support.  - Will continue to monitor closely.         Nathon Stefanski M DELA CRUZ 04/07/2024, 1:38 AM

## 2024-04-07 NOTE — Progress Notes (Signed)
 PHARMACY - PHYSICIAN COMMUNICATION CRITICAL VALUE ALERT - BLOOD CULTURE IDENTIFICATION (BCID)  Kaitlyn Wright is an 78 y.o. female who presented to [facility] on 04/06/2024 with a chief complaint of AMS and falls  Assessment:  1/4 staph epi (+mecA) - likely contaminant  Name of physician (or Provider) Contacted: [place]  Current antibiotics: None  Changes to prescribed antibiotics recommended:  No antibiotics needed at this time  Results for orders placed or performed during the hospital encounter of 04/06/24  Blood Culture ID Panel (Reflexed) (Collected: 04/06/2024  3:30 PM)  Result Value Ref Range   Enterococcus faecalis NOT DETECTED NOT DETECTED   Enterococcus Faecium NOT DETECTED NOT DETECTED   Listeria monocytogenes NOT DETECTED NOT DETECTED   Staphylococcus species DETECTED (A) NOT DETECTED   Staphylococcus aureus (BCID) NOT DETECTED NOT DETECTED   Staphylococcus epidermidis DETECTED (A) NOT DETECTED   Staphylococcus lugdunensis NOT DETECTED NOT DETECTED   Streptococcus species NOT DETECTED NOT DETECTED   Streptococcus agalactiae NOT DETECTED NOT DETECTED   Streptococcus pneumoniae NOT DETECTED NOT DETECTED   Streptococcus pyogenes NOT DETECTED NOT DETECTED   A.calcoaceticus-baumannii NOT DETECTED NOT DETECTED   Bacteroides fragilis NOT DETECTED NOT DETECTED   Enterobacterales NOT DETECTED NOT DETECTED   Enterobacter cloacae complex NOT DETECTED NOT DETECTED   Escherichia coli NOT DETECTED NOT DETECTED   Klebsiella aerogenes NOT DETECTED NOT DETECTED   Klebsiella oxytoca NOT DETECTED NOT DETECTED   Klebsiella pneumoniae NOT DETECTED NOT DETECTED   Proteus species NOT DETECTED NOT DETECTED   Salmonella species NOT DETECTED NOT DETECTED   Serratia marcescens NOT DETECTED NOT DETECTED   Haemophilus influenzae NOT DETECTED NOT DETECTED   Neisseria meningitidis NOT DETECTED NOT DETECTED   Pseudomonas aeruginosa NOT DETECTED NOT DETECTED   Stenotrophomonas maltophilia NOT  DETECTED NOT DETECTED   Candida albicans NOT DETECTED NOT DETECTED   Candida auris NOT DETECTED NOT DETECTED   Candida glabrata NOT DETECTED NOT DETECTED   Candida krusei NOT DETECTED NOT DETECTED   Candida parapsilosis NOT DETECTED NOT DETECTED   Candida tropicalis NOT DETECTED NOT DETECTED   Cryptococcus neoformans/gattii NOT DETECTED NOT DETECTED   Methicillin resistance mecA/C DETECTED (A) NOT DETECTED    Vito Ralph, PharmD, BCPS Please see amion for complete clinical pharmacist phone list 04/07/2024  11:01 PM

## 2024-04-07 NOTE — Plan of Care (Signed)

## 2024-04-08 ENCOUNTER — Inpatient Hospital Stay (HOSPITAL_COMMUNITY)

## 2024-04-08 DIAGNOSIS — M6282 Rhabdomyolysis: Secondary | ICD-10-CM | POA: Diagnosis not present

## 2024-04-08 DIAGNOSIS — I951 Orthostatic hypotension: Secondary | ICD-10-CM | POA: Diagnosis not present

## 2024-04-08 DIAGNOSIS — D72829 Elevated white blood cell count, unspecified: Secondary | ICD-10-CM | POA: Diagnosis not present

## 2024-04-08 DIAGNOSIS — G9341 Metabolic encephalopathy: Secondary | ICD-10-CM | POA: Diagnosis not present

## 2024-04-08 LAB — CBC
HCT: 30.3 % — ABNORMAL LOW (ref 36.0–46.0)
Hemoglobin: 10.1 g/dL — ABNORMAL LOW (ref 12.0–15.0)
MCH: 30.2 pg (ref 26.0–34.0)
MCHC: 33.3 g/dL (ref 30.0–36.0)
MCV: 90.7 fL (ref 80.0–100.0)
Platelets: 238 K/uL (ref 150–400)
RBC: 3.34 MIL/uL — ABNORMAL LOW (ref 3.87–5.11)
RDW: 15.4 % (ref 11.5–15.5)
WBC: 13.9 K/uL — ABNORMAL HIGH (ref 4.0–10.5)
nRBC: 0 % (ref 0.0–0.2)

## 2024-04-08 LAB — BASIC METABOLIC PANEL WITH GFR
Anion gap: 9 (ref 5–15)
BUN: 15 mg/dL (ref 8–23)
CO2: 23 mmol/L (ref 22–32)
Calcium: 8.1 mg/dL — ABNORMAL LOW (ref 8.9–10.3)
Chloride: 104 mmol/L (ref 98–111)
Creatinine, Ser: 0.76 mg/dL (ref 0.44–1.00)
GFR, Estimated: >60 mL/min (ref >60)
Glucose, Bld: 117 mg/dL — ABNORMAL HIGH (ref 70–99)
Potassium: 2.8 mmol/L — ABNORMAL LOW (ref 3.5–5.1)
Sodium: 136 mmol/L (ref 135–145)

## 2024-04-08 LAB — GLUCOSE, CAPILLARY: Glucose-Capillary: 136 mg/dL — ABNORMAL HIGH (ref 70–99)

## 2024-04-08 MED ORDER — PHENTOLAMINE MESYLATE 5 MG IJ SOLR
5.0000 mg | Freq: Once | INTRAMUSCULAR | Status: AC
  Administered 2024-04-08: 5 mg via SUBCUTANEOUS
  Filled 2024-04-08: qty 5

## 2024-04-08 MED ORDER — POTASSIUM CHLORIDE CRYS ER 20 MEQ PO TBCR
40.0000 meq | EXTENDED_RELEASE_TABLET | Freq: Once | ORAL | Status: DC
  Filled 2024-04-08: qty 2

## 2024-04-08 MED ORDER — CLOPIDOGREL BISULFATE 75 MG PO TABS
75.0000 mg | ORAL_TABLET | Freq: Every day | ORAL | Status: DC
  Administered 2024-04-08 – 2024-04-16 (×9): 75 mg via ORAL
  Filled 2024-04-08 (×9): qty 1

## 2024-04-08 MED ORDER — POTASSIUM CHLORIDE 10 MEQ/100ML IV SOLN
10.0000 meq | INTRAVENOUS | Status: AC
  Administered 2024-04-08 (×4): 10 meq via INTRAVENOUS
  Filled 2024-04-08 (×4): qty 100

## 2024-04-08 MED ORDER — NITROGLYCERIN 2 % TD OINT
1.0000 [in_us] | TOPICAL_OINTMENT | Freq: Three times a day (TID) | TRANSDERMAL | Status: DC
  Administered 2024-04-08 – 2024-04-09 (×3): 1 [in_us] via TOPICAL
  Filled 2024-04-08: qty 30

## 2024-04-08 MED ORDER — ENSURE PLUS HIGH PROTEIN PO LIQD
237.0000 mL | Freq: Two times a day (BID) | ORAL | Status: DC
  Administered 2024-04-09 – 2024-04-16 (×11): 237 mL via ORAL

## 2024-04-08 NOTE — Progress Notes (Signed)
 eLink Physician-Brief Progress Note Patient Name: Kaitlyn Wright DOB: 1946-05-12 MRN: [id number]   Date of Service  04/08/2024  HPI/Events of Note  Notified that patient had PIV Levo infiltrated, able to switch to another PIV (only one left)  eICU Interventions  Protocol for infiltration already placed Levo requirement has been on and off but currently titrating down Should pressors be needed beyond 72 hours, central line access should be obtained.  It will be the responsibility of the bedside nurse to follow best practice to prevent extravasations. Bedside CCM team informed as well.     Intervention Category Intermediate Interventions: Other:;Communication with other healthcare providers and/or family  Damien ONEIDA Grout 04/08/2024, 4:08 AM

## 2024-04-08 NOTE — Progress Notes (Signed)
 [facility] ADULT ICU REPLACEMENT PROTOCOL   The patient does apply for the [facility] Adult ICU Electrolyte Replacment Protocol based on the criteria listed below:   1.Exclusion criteria: TCTS, ECMO, Dialysis, and Myasthenia Gravis patients 2. Is GFR >/= 30 ml/min? Yes.    Patient's GFR today is > 60 3. Is SCr </= 2? Yes.   Patient's SCr is 0.75 mg/dL 4. Did SCr increase >/= 0.5 in 24 hours? No. 5.Pt's weight >40kg  Yes.   6. Abnormal electrolyte(s): K+2.8  7. Electrolytes replaced per protocol 8.  Call MD STAT for K+ </= 2.5, Phos </= 1, or Mag </= 1 Physician:  Dr Marion Darner, Norleen Barters 04/08/2024 3:49 AM

## 2024-04-08 NOTE — Plan of Care (Signed)

## 2024-04-08 NOTE — Progress Notes (Signed)
 NAME:  Kaitlyn Wright, MRN:  [id number], DOB:  1945-11-20, LOS: 2 ADMISSION DATE:  04/06/2024, CONSULTATION DATE:  04/06/2024 REFERRING MD: Alfornia Madison, MD, CHIEF COMPLAINT:  hypotension    History of Present Illness:  A 78 y.o. female patient with CAD s/p CABG 01/27/2024 (aspirin , atorvastatin , clopidogrel , and metoprolol ), postop A-fib (not on [facility]), elevated LFTs (Amiodarone  was d/c March 19, 2024), anxiety, depression, hypothyroidism, and hyperlipidemia. Midodrine  was d/c March 19, 2024 due to simplify her regimen. She presented to ED with AMS and falls.  She also mentions falling against her sink a few weeks ago.  Denies chest pain, SOB, LL edema, f/c/r, or N/V/D. She has pain allover.  Per ED report, patient was last seen by a neighbor 3 days ago and then today found on the ground with a malodorous smell and was confused.  She was mildly hypotensive with EMS during transport. She received 4 L crystalloids, Midodrine  10 mg, and Albumin  25 mg (25%), yet her BP is borderline low and PCCM was consulted for eval.   Pertinent  Medical History  CAD s/p CABG 01/27/2024, postop A-fib, elevated LFTs, anxiety, depression, hypothyroidism, hyperlipidemia.   Significant Hospital Events: Including procedures, antibiotic start and stop dates in addition to other pertinent events   7/18 Admission and transfer to ICU for hypotension: Chest x-ray showing possible trace left pleural effusion. CT head negative for acute intracranial abnormality. CT maxillofacial showing no evidence of acute traumatic injury. X-ray of right hip/pelvis negative for fracture or malalignment.  EKG showing sinus rhythm, no STEMI, QTc 575. CK, WBC, are LFT are high.   Interim History / Subjective:  Doing ok, BP soft yesterday evening back on low-dose norepinephrine , subsequently weaned overnight, renal function stable  Objective    Blood pressure (!) 111/55, pulse 73, temperature 98.4 F (36.9 C), temperature source Oral, resp. rate  20, height 5' 3 (1.6 m), weight 54.2 kg, SpO2 95%.        Intake/Output Summary (Last 24 hours) at 04/08/2024 0800 Last data filed at 04/08/2024 0550 Gross per 24 hour  Intake 749.32 ml  Output 525 ml  Net 224.32 ml   Filed Weights   04/06/24 1444 04/08/24 0500  Weight: 52.2 kg 54.2 kg    Examination: General: Frail appearing woman in NAD. Comfortable. HENT: PERL, dry oral mucosa. No LNE or thyromegaly. No JVD Lungs: symmetrical air entry bilaterally. No crackles or wheezing. On RA. Cardiovascular: NL S1/S2. No m/g/r Abdomen: no distension or ascites. Right flank bruise and tenderness.  Extremities: no edema. Symmetrical. Warm extremities. No hip painful movement Neuro: AO3. Nonfocal.  Resolved problem list  Confusion, metabolic encephalopathy  Assessment and Plan   Hypotension without shock Likely dehydration with recent fall and GI illness Possible medication effect with xanax  and beta blockade -Status post adequate IV hydration, encourage p.o. intake - Continue Midodrine  10 TID - Echo with grade 2 diastolic dysfunction, dilated LA, severe MR - hold BB (history Afib), reduce xanax  dose from 1 milligram twice daily scheduled to 0.25 milligram twice daily prn -Norepinephrine , MAP greater than 60, currently off - If improvement through day, may be able to leave ICU  Fall from standing, discovered on home floor Possible orthostatic hypotension with history of falls Elevated CK without AKI - Trend renal function - PT/OT to eval  Leukocytosis --Infectious workup not revealing. UA, CXR, Procal negative. No fevers. Could be due to her fall. Trend fevers and WBCs. Hold abx.  Elevated LFTs due to mild CK elevation with  cross-reactivity and meds like Lipitor  and previously Amiodarone  --Improving   CAD s/p CABG 01/27/2024.  May 2025, Echo EF 60% Postop Afib: in sinus  Hyperlipidemia -Resume aspirin  - Hold clopidogrel , atorvastatin , and metoprolol     Anxiety/depression -Reduce xanax  per above   Hypothyroidism - Continue home Levothyroxine     GERD - Continue home PPI  Confusion, metabolic encephalopathy - Resolved No longer encephalopathic  Plan transfer out of ICU if stable through the day  Best Practice (right click and Reselect all SmartList Selections daily)   Diet/type: Regular consistency (see orders) DVT prophylaxis prophylactic heparin   Pressure ulcer(s): N/A GI prophylaxis: N/A Resume home PPI Lines: N/A Foley:  N/A Code Status:  full code Last date of multidisciplinary goals of care discussion []   Labs   CBC: Recent Labs  Lab 04/06/24 1430 04/07/24 0002 04/07/24 0005 04/07/24 0247 04/08/24 0259  WBC 16.5*  --  14.7* 15.3* 13.9*  NEUTROABS 14.6*  --   --   --   --   HGB 13.5 9.9* 11.0* 11.9* 10.1*  HCT 39.6 29.0* 33.7* 35.7* 30.3*  MCV 91.0  --  92.8 92.0 90.7  PLT 332  --  235 271 238   Basic Metabolic Panel: Recent Labs  Lab 04/06/24 1430 04/07/24 0002 04/07/24 0005 04/07/24 0247 04/08/24 0259  NA 138 140  --  140 136  K 4.1 3.1*  --  3.4* 2.8*  CL 96*  --   --  104 104  CO2 28  --   --  24 23  GLUCOSE 127*  --   --  105* 117*  BUN 11  --   --  12 15  CREATININE 0.89  --  0.66 0.72 0.76  CALCIUM  9.2  --   --  8.2* 8.1*  MG  --   --   --  1.6*  --    GFR: Estimated Creatinine Clearance: 46.4 mL/min (by C-G formula based on SCr of 0.76 mg/dL). Recent Labs  Lab 04/06/24 1430 04/06/24 1442 04/07/24 0005 04/07/24 0247 04/08/24 0259  PROCALCITON  --   --  0.13  --   --   WBC 16.5*  --  14.7* 15.3* 13.9*  LATICACIDVEN  --  1.8 1.3  --   --    Liver Function Tests: Recent Labs  Lab 04/06/24 1430 04/07/24 0247  AST 298* 176*  ALT 390* 236*  ALKPHOS 169* 122  BILITOT 3.0* 2.1*  PROT 7.3 5.7*  ALBUMIN  3.2* 3.1*   No results for input(s): LIPASE, AMYLASE in the last 168 hours. Recent Labs  Lab 04/07/24 0247  AMMONIA 29   ABG    Component Value Date/Time   PHART  7.440 04/07/2024 0002   PCO2ART 36.6 04/07/2024 0002   PO2ART 87 04/07/2024 0002   HCO3 24.9 04/07/2024 0002   TCO2 26 04/07/2024 0002   ACIDBASEDEF 4.0 (H) 01/29/2024 2050   O2SAT 97 04/07/2024 0002    Coagulation Profile: Recent Labs  Lab 04/06/24 2317  INR 1.2   Cardiac Enzymes: Recent Labs  Lab 04/06/24 1430 04/07/24 0247  CKTOTAL 855* 843*   HbA1C: Hgb A1c MFr Bld  Date/Time Value Ref Range Status  01/24/2024 04:16 AM 5.6 4.8 - 5.6 % Final    Comment:    (NOTE) Pre diabetes:          5.7%-6.4%  Diabetes:              >6.4%  Glycemic control for   <7.0% adults with diabetes  CBG: No results for input(s): GLUCAP in the last 168 hours.  Review of Systems:   N/a  Past Medical History:  She,  has a past medical history of Anxiety, Depression, and Hypothyroidism.   Surgical History:   Past Surgical History:  Procedure Laterality Date   CHOLECYSTECTOMY     CORONARY ARTERY BYPASS GRAFT N/A 01/27/2024   Procedure: CORONARY ARTERY BYPASS GRAFTING X 4, USING LEFT INTERNAL MAMMARY ARTERY AND RIGHT ENDOSCOPIC HARVESTED GREATER SAPHENOUS VEIN;  Surgeon: Kerrin Elspeth BROCKS, MD;  Location: [facility] OR;  Service: Open Heart Surgery;  Laterality: N/A;   IR THORACENTESIS ASP PLEURAL SPACE W/IMG GUIDE  02/07/2024   LEFT HEART CATH AND CORONARY ANGIOGRAPHY N/A 01/24/2024   Procedure: LEFT HEART CATH AND CORONARY ANGIOGRAPHY;  Surgeon: Ladona Heinz, MD;  Location: [facility] INVASIVE CV LAB;  Service: Cardiovascular;  Laterality: N/A;   TEE WITHOUT CARDIOVERSION N/A 01/27/2024   Procedure: ECHOCARDIOGRAM, TRANSESOPHAGEAL;  Surgeon: Kerrin Elspeth BROCKS, MD;  Location: [facility] OR;  Service: Open Heart Surgery;  Laterality: N/A;    Social History:   reports that she has never smoked. She has never used smokeless tobacco. She reports that she does not drink alcohol and does not use drugs.   Family History:  Her family history includes Stroke in her father.   Allergies Allergies  Allergen  Reactions   Naprosyn  [Naproxen ]     Upset stomach    Latex Rash    Home Medications  Prior to Admission medications   Medication Sig Start Date End Date Taking? Authorizing Provider  aspirin  EC 81 MG tablet Take 1 tablet (81 mg total) by mouth daily. Swallow whole. 02/15/24   Raguel Con RAMAN, PA-C  atorvastatin  (LIPITOR ) 80 MG tablet Take 1 tablet (80 mg total) by mouth daily at 6 PM. 03/28/24   Williams, Evan, PA-C  B Complex-C (B-COMPLEX WITH VITAMIN C) tablet Take 1 tablet by mouth daily.    [provider]  cholecalciferol  (VITAMIN D ) 1000 UNITS tablet Take 1,000 Units by mouth daily.    [provider]  Cinnamon 500 MG capsule Take 500 mg by mouth daily.    [provider]  clopidogrel  (PLAVIX ) 75 MG tablet Take 1 tablet (75 mg total) by mouth daily. 03/28/24   Williams, Evan, PA-C  cyclobenzaprine (FLEXERIL) 5 MG tablet Take 5 mg by mouth at bedtime as needed. 03/30/24   [provider]  hydrOXYzine  (ATARAX ) 10 MG tablet Take 10 mg by mouth at bedtime. 01/03/24   [provider]  Anselm Oil 1000 MG CAPS Take 1 capsule by mouth daily.    [provider]  latanoprost  (XALATAN ) 0.005 % ophthalmic solution Place 1 drop into both eyes at bedtime. 09/16/23   [provider]  levothyroxine  (SYNTHROID ) 88 MCG tablet Take 1 tablet (88 mcg total) by mouth daily. 03/26/24   Williams, Evan, PA-C  loperamide  (IMODIUM ) 1 MG/5ML solution Take 5 mLs by mouth daily as needed for diarrhea or loose stools.    [provider]  metoprolol  tartrate (LOPRESSOR ) 25 MG tablet Take 0.5 tablets (12.5 mg total) by mouth 2 (two) times daily. 03/28/24   Trudy Birmingham, PA-C  Omega-3 Fatty Acids (FISH OIL) 1000 MG CPDR Take 1 capsule by mouth 2 (two) times daily.    [provider]  ondansetron  (ZOFRAN ) 4 MG tablet Take 4 mg by mouth every 8 (eight) hours as needed for nausea or vomiting. 03/12/24   [provider]  pantoprazole   (PROTONIX ) 40 MG tablet Take  1 tablet (40 mg total) by mouth daily. 03/28/24   Williams, Evan, PA-C  Polyethyl Glycol-Propyl Glycol (SYSTANE FREE OP) Place 1 drop into both eyes 2 (two) times daily.    [provider]  Red Yeast Rice 600 MG CAPS Take 2 capsules by mouth 2 (two) times daily.    [provider]  traMADol  (ULTRAM ) 50 MG tablet Take 1 tablet (50 mg total) by mouth every 6 (six) hours as needed for moderate pain (pain score 4-6). 02/15/24   Raguel Con RAMAN, PA-C  vitamin C (ASCORBIC ACID) 500 MG tablet Take 500 mg by mouth daily.    [provider]    Critical care time:     CRITICAL CARE Performed by: Donnice JONELLE Beals   Total critical care time: 30 minutes  Critical care time was exclusive of separately billable procedures and treating other patients.  Critical care was necessary to treat or prevent imminent or life-threatening deterioration.  Critical care was time spent personally by me on the following activities: development of treatment plan with patient and/or surrogate as well as nursing, discussions with consultants, evaluation of patient's response to treatment, examination of patient, obtaining history from patient or surrogate, ordering and performing treatments and interventions, ordering and review of laboratory studies, ordering and review of radiographic studies, pulse oximetry and re-evaluation of patient's condition.   Donnice JONELLE Beals, MD See TRACEY

## 2024-04-08 NOTE — Progress Notes (Signed)
 eLink Physician-Brief Progress Note Patient Name: Kaitlyn Wright DOB: [birth year] MRN: [id number]   Date of Service  04/08/2024  HPI/Events of Note  No urine all shift, bladder scan 433, need I&O ordered.  eICU Interventions  In and out cath ordered     Intervention Category Intermediate Interventions: Oliguria - evaluation and management  Damien ONEIDA Grout 04/08/2024, 5:35 AM

## 2024-04-09 DIAGNOSIS — I2510 Atherosclerotic heart disease of native coronary artery without angina pectoris: Secondary | ICD-10-CM | POA: Diagnosis not present

## 2024-04-09 DIAGNOSIS — I340 Nonrheumatic mitral (valve) insufficiency: Secondary | ICD-10-CM

## 2024-04-09 DIAGNOSIS — I959 Hypotension, unspecified: Secondary | ICD-10-CM | POA: Diagnosis not present

## 2024-04-09 DIAGNOSIS — I951 Orthostatic hypotension: Secondary | ICD-10-CM | POA: Diagnosis not present

## 2024-04-09 LAB — COMPREHENSIVE METABOLIC PANEL WITH GFR
ALT: 185 U/L — ABNORMAL HIGH (ref 0–44)
AST: 153 U/L — ABNORMAL HIGH (ref 15–41)
Albumin: 2.6 g/dL — ABNORMAL LOW (ref 3.5–5.0)
Alkaline Phosphatase: 121 U/L (ref 38–126)
Anion gap: 9 (ref 5–15)
BUN: 13 mg/dL (ref 8–23)
CO2: 26 mmol/L (ref 22–32)
Calcium: 8.3 mg/dL — ABNORMAL LOW (ref 8.9–10.3)
Chloride: 105 mmol/L (ref 98–111)
Creatinine, Ser: 0.86 mg/dL (ref 0.44–1.00)
GFR, Estimated: >60 mL/min (ref >60)
Glucose, Bld: 130 mg/dL — ABNORMAL HIGH (ref 70–99)
Potassium: 3.7 mmol/L (ref 3.5–5.1)
Sodium: 140 mmol/L (ref 135–145)
Total Bilirubin: 0.9 mg/dL (ref 0.0–1.2)
Total Protein: 5.6 g/dL — ABNORMAL LOW (ref 6.5–8.1)

## 2024-04-09 LAB — CBC
HCT: 33.8 % — ABNORMAL LOW (ref 36.0–46.0)
Hemoglobin: 11.1 g/dL — ABNORMAL LOW (ref 12.0–15.0)
MCH: 30.2 pg (ref 26.0–34.0)
MCHC: 32.8 g/dL (ref 30.0–36.0)
MCV: 91.8 fL (ref 80.0–100.0)
Platelets: 242 K/uL (ref 150–400)
RBC: 3.68 MIL/uL — ABNORMAL LOW (ref 3.87–5.11)
RDW: 15.8 % — ABNORMAL HIGH (ref 11.5–15.5)
WBC: 10.8 K/uL — ABNORMAL HIGH (ref 4.0–10.5)
nRBC: 0 % (ref 0.0–0.2)

## 2024-04-09 LAB — CULTURE, BLOOD (ROUTINE X 2): Special Requests: ADEQUATE

## 2024-04-09 LAB — GLUCOSE, CAPILLARY: Glucose-Capillary: 97 mg/dL (ref 70–99)

## 2024-04-09 MED ORDER — LATANOPROST 0.005 % OP SOLN
1.0000 [drp] | Freq: Every day | OPHTHALMIC | Status: DC
  Administered 2024-04-09 – 2024-04-15 (×7): 1 [drp] via OPHTHALMIC
  Filled 2024-04-09: qty 2.5

## 2024-04-09 NOTE — Progress Notes (Signed)
 NAME:  Kaitlyn Wright, MRN:  [id number], DOB:  15-Apr-1946, LOS: 3 ADMISSION DATE:  04/06/2024, CONSULTATION DATE:  04/06/2024 REFERRING MD: Alfornia Madison, MD, CHIEF COMPLAINT:  hypotension    History of Present Illness:  A 78 y.o. female patient with CAD s/p CABG 01/27/2024 (aspirin , atorvastatin , clopidogrel , and metoprolol ), postop A-fib (not on [facility]), elevated LFTs (Amiodarone  was d/c March 19, 2024), anxiety, depression, hypothyroidism, and hyperlipidemia. Midodrine  was d/c March 19, 2024 due to simplify her regimen. She presented to ED with AMS and falls.  She also mentions falling against her sink a few weeks ago.  Denies chest pain, SOB, LL edema, f/c/r, or N/V/D. She has pain allover.  Per ED report, patient was last seen by a neighbor 3 days ago and then today found on the ground with a malodorous smell and was confused.  She was mildly hypotensive with EMS during transport. She received 4 L crystalloids, Midodrine  10 mg, and Albumin  25 mg (25%), yet her BP is borderline low and PCCM was consulted for eval.   Pertinent  Medical History  CAD s/p CABG 01/27/2024, postop A-fib, elevated LFTs, anxiety, depression, hypothyroidism, hyperlipidemia.   Significant Hospital Events: Including procedures, antibiotic start and stop dates in addition to other pertinent events   7/18 Admission and transfer to ICU for hypotension: Chest x-ray showing possible trace left pleural effusion. CT head negative for acute intracranial abnormality. CT maxillofacial showing no evidence of acute traumatic injury. X-ray of right hip/pelvis negative for fracture or malalignment. EKG showing sinus rhythm, no STEMI, QTc 575. CK, WBC, are LFT are high.   Interim History / Subjective:  Doing ok, BP soft yesterday evening back on low-dose norepinephrine , subsequently weaned overnight, renal function stable   Objective    Blood pressure 131/67, pulse 85, temperature 98.3 F (36.8 C), temperature source Oral, resp. rate 16,  height 5' 3 (1.6 m), weight 51.7 kg, SpO2 95%.        Intake/Output Summary (Last 24 hours) at 04/09/2024 1137 Last data filed at 04/09/2024 1000 Gross per 24 hour  Intake 744.48 ml  Output --  Net 744.48 ml   Filed Weights   04/06/24 1444 04/08/24 0500 04/09/24 0500  Weight: 52.2 kg 54.2 kg 51.7 kg   Examination: General: Frail appearing woman in NAD. Comfortable. HENT: PERL, moist oral mucosa. No LNE or thyromegaly. No JVD Lungs: symmetrical air entry bilaterally. No crackles or wheezing. On RA. Cardiovascular: NL S1/S2. No m/g/r Abdomen: no distension or ascites. Right flank bruise and tenderness.  Extremities: no edema. Symmetrical. Warm extremities. No hip painful movement Neuro: AO3. Nonfocal.  Resolved problem list  Confusion, metabolic encephalopathy   Assessment and Plan   Hypotension without shock Likely dehydration with recent fall and GI illness Possible medication effect with xanax  and beta blockade Off levophed  this am. Pressures sturdy on midodrine . Mentation intact, warm extremities. Will hold on further fluid resuscitation, but PO intake encouraged. - Status post adequate IV hydration, encourage p.o. intake - Continue Midodrine  10 TID - hold BB (history Afib), reduce xanax  dose from 1 milligram twice daily scheduled to 0.25 milligram twice daily prn - If improvement through day, should be able to leave ICU   New Severe Mitral Regurgitation Per echo report this is new vs the May study. Flail middle scallop of posterior leaflet, now severe from normal in May. I wonder if this contributed to her initial hypotension and slow recovery. Will discuss with cardiology.  Fall from standing, discovered on home floor Possible  orthostatic hypotension with history of falls Elevated CK without AKI - Trend renal function - PT/OT to eval   Leukocytosis Resolving. Infectious workup not revealing. UA, CXR, Procal negative. No fevers. Could be due to her fall. Trend fevers  and WBCs. Hold abx.  R arm numbness Report new over last few days. Neurovascularly intact. Seems that the distal extremity is asleep. This is the same arm as her blood pressure cuff, suspect that is the cause. When hemodynamics improve, q15 checks may not be necessary. Will monitor.   Elevated LFTs due to mild CK elevation with cross-reactivity and meds like Lipitor  and previously Amiodarone  --Improving   CAD s/p CABG 01/27/2024.  May 2025, Echo EF 60% Postop Afib: in sinus  Hyperlipidemia -Resume aspirin  - Hold clopidogrel , atorvastatin , and metoprolol    Anxiety/depression -Reduce xanax  per above   Hypothyroidism - Continue home Levothyroxine     GERD - Continue home PPI   Confusion, metabolic encephalopathy - Resolved No longer encephalopathic   Plan transfer out of ICU if stable through the day  Best Practice (right click and Reselect all SmartList Selections daily)   Diet/type: Regular consistency (see orders) DVT prophylaxis prophylactic heparin   Pressure ulcer(s): N/A GI prophylaxis: N/A Resume home PPI Lines: N/A Foley:  N/A Code Status:  full code Last date of multidisciplinary goals of care discussion []   Labs   CBC: Recent Labs  Lab 04/06/24 1430 04/07/24 0002 04/07/24 0005 04/07/24 0247 04/08/24 0259 04/09/24 0734  WBC 16.5*  --  14.7* 15.3* 13.9* 10.8*  NEUTROABS 14.6*  --   --   --   --   --   HGB 13.5 9.9* 11.0* 11.9* 10.1* 11.1*  HCT 39.6 29.0* 33.7* 35.7* 30.3* 33.8*  MCV 91.0  --  92.8 92.0 90.7 91.8  PLT 332  --  235 271 238 242   Basic Metabolic Panel: Recent Labs  Lab 04/06/24 1430 04/07/24 0002 04/07/24 0005 04/07/24 0247 04/08/24 0259 04/09/24 0734  NA 138 140  --  140 136 140  K 4.1 3.1*  --  3.4* 2.8* 3.7  CL 96*  --   --  104 104 105  CO2 28  --   --  24 23 26   GLUCOSE 127*  --   --  105* 117* 130*  BUN 11  --   --  12 15 13   CREATININE 0.89  --  0.66 0.72 0.76 0.86  CALCIUM  9.2  --   --  8.2* 8.1* 8.3*  MG  --   --    --  1.6*  --   --    GFR: Estimated Creatinine Clearance: 42.6 mL/min (by C-G formula based on SCr of 0.86 mg/dL). Recent Labs  Lab 04/06/24 1442 04/07/24 0005 04/07/24 0247 04/08/24 0259 04/09/24 0734  PROCALCITON  --  0.13  --   --   --   WBC  --  14.7* 15.3* 13.9* 10.8*  LATICACIDVEN 1.8 1.3  --   --   --    Liver Function Tests: Recent Labs  Lab 04/06/24 1430 04/07/24 0247 04/09/24 0734  AST 298* 176* 153*  ALT 390* 236* 185*  ALKPHOS 169* 122 121  BILITOT 3.0* 2.1* 0.9  PROT 7.3 5.7* 5.6*  ALBUMIN  3.2* 3.1* 2.6*   No results for input(s): LIPASE, AMYLASE in the last 168 hours. Recent Labs  Lab 04/07/24 0247  AMMONIA 29   ABG    Component Value Date/Time   PHART 7.440 04/07/2024 0002   PCO2ART 36.6 04/07/2024  0002   PO2ART 87 04/07/2024 0002   HCO3 24.9 04/07/2024 0002   TCO2 26 04/07/2024 0002   ACIDBASEDEF 4.0 (H) 01/29/2024 2050   O2SAT 97 04/07/2024 0002    Coagulation Profile: Recent Labs  Lab 04/06/24 2317  INR 1.2   Cardiac Enzymes: Recent Labs  Lab 04/06/24 1430 04/07/24 0247  CKTOTAL 855* 843*   HbA1C: Hgb A1c MFr Bld  Date/Time Value Ref Range Status  01/24/2024 04:16 AM 5.6 4.8 - 5.6 % Final    Comment:    (NOTE) Pre diabetes:          5.7%-6.4%  Diabetes:              >6.4%  Glycemic control for   <7.0% adults with diabetes    CBG: Recent Labs  Lab 04/07/24 0027 04/08/24 2314  GLUCAP 97 136*   Review of Systems:   Denies chest pain, fevers, chills, dizziness, lightheadedness, malaise. Shares of some numbness of the R arm. This is the same arm as her blood pressure cuff.  Past Medical History:  She,  has a past medical history of Anxiety, Depression, and Hypothyroidism.   Surgical History:   Past Surgical History:  Procedure Laterality Date   CHOLECYSTECTOMY     CORONARY ARTERY BYPASS GRAFT N/A 01/27/2024   Procedure: CORONARY ARTERY BYPASS GRAFTING X 4, USING LEFT INTERNAL MAMMARY ARTERY AND RIGHT ENDOSCOPIC  HARVESTED GREATER SAPHENOUS VEIN;  Surgeon: Kerrin Elspeth BROCKS, MD;  Location: [facility] OR;  Service: Open Heart Surgery;  Laterality: N/A;   IR THORACENTESIS ASP PLEURAL SPACE W/IMG GUIDE  02/07/2024   LEFT HEART CATH AND CORONARY ANGIOGRAPHY N/A 01/24/2024   Procedure: LEFT HEART CATH AND CORONARY ANGIOGRAPHY;  Surgeon: Ladona Heinz, MD;  Location: [facility] INVASIVE CV LAB;  Service: Cardiovascular;  Laterality: N/A;   TEE WITHOUT CARDIOVERSION N/A 01/27/2024   Procedure: ECHOCARDIOGRAM, TRANSESOPHAGEAL;  Surgeon: Kerrin Elspeth BROCKS, MD;  Location: [facility] OR;  Service: Open Heart Surgery;  Laterality: N/A;    Social History:   reports that she has never smoked. She has never used smokeless tobacco. She reports that she does not drink alcohol and does not use drugs.   Family History:  Her family history includes Stroke in her father.   Allergies Allergies  Allergen Reactions   Naprosyn  [Naproxen ]     Upset stomach    Latex Rash     Home Medications  Prior to Admission medications   Medication Sig Start Date End Date Taking? Authorizing Provider  aspirin  EC 81 MG tablet Take 1 tablet (81 mg total) by mouth daily. Swallow whole. 02/15/24   Raguel Con RAMAN, PA-C  atorvastatin  (LIPITOR ) 80 MG tablet Take 1 tablet (80 mg total) by mouth daily at 6 PM. 03/28/24   Williams, Evan, PA-C  B Complex-C (B-COMPLEX WITH VITAMIN C) tablet Take 1 tablet by mouth daily.    [provider]  cholecalciferol  (VITAMIN D ) 1000 UNITS tablet Take 1,000 Units by mouth daily.    [provider]  Cinnamon 500 MG capsule Take 500 mg by mouth daily.    [provider]  clopidogrel  (PLAVIX ) 75 MG tablet Take 1 tablet (75 mg total) by mouth daily. 03/28/24   Williams, Evan, PA-C  cyclobenzaprine (FLEXERIL) 5 MG tablet Take 5 mg by mouth at bedtime as needed. 03/30/24   [provider]  hydrOXYzine  (ATARAX ) 10 MG tablet Take 10 mg by mouth at bedtime. 01/03/24   [provider]  Anselm Oil  1000 MG  CAPS Take 1 capsule by mouth daily.    [provider]  latanoprost  (XALATAN ) 0.005 % ophthalmic solution Place 1 drop into both eyes at bedtime. 09/16/23   [provider]  levothyroxine  (SYNTHROID ) 88 MCG tablet Take 1 tablet (88 mcg total) by mouth daily. 03/26/24   Williams, Evan, PA-C  loperamide  (IMODIUM ) 1 MG/5ML solution Take 5 mLs by mouth daily as needed for diarrhea or loose stools.    [provider]  metoprolol  tartrate (LOPRESSOR ) 25 MG tablet Take 0.5 tablets (12.5 mg total) by mouth 2 (two) times daily. 03/28/24   Trudy Birmingham, PA-C  Omega-3 Fatty Acids (FISH OIL) 1000 MG CPDR Take 1 capsule by mouth 2 (two) times daily.    [provider]  ondansetron  (ZOFRAN ) 4 MG tablet Take 4 mg by mouth every 8 (eight) hours as needed for nausea or vomiting. 03/12/24   [provider]  pantoprazole  (PROTONIX ) 40 MG tablet Take 1 tablet (40 mg total) by mouth daily. 03/28/24   Williams, Evan, PA-C  Polyethyl Glycol-Propyl Glycol (SYSTANE FREE OP) Place 1 drop into both eyes 2 (two) times daily.    [provider]  Red Yeast Rice 600 MG CAPS Take 2 capsules by mouth 2 (two) times daily.    [provider]  traMADol  (ULTRAM ) 50 MG tablet Take 1 tablet (50 mg total) by mouth every 6 (six) hours as needed for moderate pain (pain score 4-6). 02/15/24   Raguel Con RAMAN, PA-C  vitamin C (ASCORBIC ACID) 500 MG tablet Take 500 mg by mouth daily.    [provider]    Critical care time: 35 mins    Lonni Africa, DO IM Resident PGY-2

## 2024-04-09 NOTE — Plan of Care (Signed)
  Problem: Clinical Measurements: Goal: Ability to maintain clinical measurements within normal limits will improve Outcome: Progressing   Problem: Clinical Measurements: Goal: Diagnostic test results will improve Outcome: Progressing   Problem: Activity: Goal: Risk for activity intolerance will decrease Outcome: Progressing   Problem: Pain Managment: Goal: General experience of comfort will improve and/or be controlled Outcome: Progressing   Problem: Safety: Goal: Ability to remain free from injury will improve Outcome: Progressing   Problem: Skin Integrity: Goal: Risk for impaired skin integrity will decrease Outcome: Progressing

## 2024-04-09 NOTE — TOC CM/SW Note (Addendum)
 Transition of Care [facility]) - Inpatient Brief Assessment   Patient Details  Name: Kaitlyn Wright MRN: [id number] Date of Birth: 11/26/1945  Transition of Care [facility] CM/SW Contact:    Lauraine FORBES Saa, LCSW Phone Number: 04/09/2024, 11:53 AM   Clinical Narrative:  11:53 AM Per chart review, patient resides at home alone. Patient has a PCP and insurance. Patient has SN[facility] history [facility]. Patient does not have HH history. Patient has DME (rolling walker, BSC, wheelchair) history with [facility]. Patient's preferred pharmacy is [facility] [phone] [facility]. Patient was recommended by therapy to discharge to SNF. CSW attempted to contact patient's daughter, [name], to discuss SNF (patient is not fully oriented). There was no response and a voicemail was left. TOC will continue to follow and be available to assist.  1:47 PM CSW attempted to contact patient's daughter, [name], to discuss SNF (patient is not fully oriented). There was no response and a voicemail was left.   Transition of Care Asessment: Insurance and Status: Insurance coverage has been reviewed Patient has primary care physician: Yes Home environment has been reviewed: Private Residence Prior level of function:: Patient poor historian/Family not available Prior/Current Home Services: No current home services Social Drivers of Health Review: SDOH reviewed no interventions necessary Readmission risk has been reviewed: Yes Transition of care needs: transition of care needs identified, TOC will continue to follow

## 2024-04-09 NOTE — Progress Notes (Incomplete)
 Attending note: I have seen and examined the patient. History, labs and imaging reviewed.   78 Y/O with fall at home, found down with fall, confusion, shock of unclear etiology Remains on low dose levophed   Blood pressure (!) 129/59, pulse 77, temperature 98.1 F (36.7 C), temperature source Oral, resp. rate 20, height 5' 3 (1.6 m), weight 51.7 kg, SpO2 100%. Gen:      No acute distress HEENT:  EOMI, sclera anicteric Neck:     No masses; no thyromegaly Lungs:    Clear to auscultation bilaterally; normal respiratory effort*** CV:         Regular rate and rhythm; no murmurs Abd:      + bowel sounds; soft, non-tender; no palpable masses, no distension Ext:    No edema; adequate peripheral perfusion Neuro: alert and oriented x 3 Psych: normal mood and affect   Labs/Imaging personally reviewed, significant for   Assessment/plan: Shock, maybe due to dehydration due to recent fall and GI illnbess Contine midodrine  Wean rpessors a Call cardiology for new dioagnos of flail mitral valve       The patient is critically ill with multiple organ systems failure and requires high complexity decision making for assessment and support, frequent evaluation and titration of therapies, application of advanced monitoring technologies and extensive interpretation of multiple databases.  Critical care time - 35 mins. This represents my time independent of the NPs time taking care of the pt.  Kaitlyn Bergquist MD [facility] 04/09/2024, 9:25 AM

## 2024-04-09 NOTE — Consult Note (Signed)
 Cardiology Consultation   Patient ID: Kaitlyn Wright MRN: [id number]; DOB: April 10, 1946  Admit date: 04/06/2024 Date of Consult: 04/09/2024  PCP:  Arloa Elsie SAUNDERS, MD   [facility] Providers Cardiologist:  None        Patient Profile: Kaitlyn Wright is a 78 y.o. female with a hx of CABG, postoperative AFIB, temporary RV failure with cardiogenic shock  who is being seen 04/09/2024 for the evaluation of severe MR at the request of Dr. Theophilus.  History of Present Illness: Patient underwent coronary artery bypass surgery in May 2025 after presenting with severe substernal chest pressure. Initial workup in the emergency department showed elevated troponin levels and an EKG with T wave inversion in inferolateral leads. Cardiac catheterization revealed severe diffuse coronary calcification, leading to the recommendation for bypass surgery. She underwent surgery with LIMA to LAD, SVG to PDA, and SVG to ramus, with endoscopic harvest of the right greater saphenous vein. Postoperatively, she required vasopressor and inotropic support due to pulmonary edema and respiratory failure but was eventually weaned off medications and discharged to rehab on Feb 15, 2024. Today patient reports having a frustrating experience at rehab. She is now ambulating with a rollator though has limited mobility due to chronic back pain and longstanding balance issues.   Since discharge, she was seen in the emergency department on March 13, 2024, for abdominal pain, frequent nausea, and decreased oral intake. Lab work showed transaminitis with elevated AST, ALT, and ALK phos levels. I saw patient in clinic follow up on 6/30, at which time I noted patient confusion regarding her medications/dosing. This had prompted her to stop all medications except for her Xanax . She was otherwise doing well at that visit, didn't report dyspnea, chest pain, edema. In order to improve compliance, I discontinued several non-essential  medications including Amiodarone , Midodrine , Torsemide /potassium. She was to continue with ASA, Plavix , Atorvastatin , Metoprolol .   Patient was then seen by [facility] on 7/1, still doing well following above medication changes.   Patient was brought to the emergency department in setting of altered mental status and falls.  Per notes, patient had been found on the ground with a malodorous smell.  Apparently had last been seen by neighbor 3 days prior.  EMS found patient to be hypotensive administered 2 L of lactated Ringer 's.  While in the emergency department, patient received a further 2.5 L.  Per notes, emergency department provider noted patient to be having trouble forming complete sentences.  She was found with bruising to her right hip.  CT head without obvious intracranial pathology.  Right hip x-ray without fracture.  Given significant departure from baseline cognitive status, patient admitted.  Following her admission, notes indicate patient with continued soft blood pressures, difficulty maintaining MAP of 65.  This was despite albumin  25 g and midodrine  10 mg.  Given persistent hypotension, critical care service consulted.  Patient was ultimately moved to the ICU where she was continued on her lactated Ringer  infusion and placed on empiric antibiotics for possible infection Patient has also been treated with Levophed  intermittently. TTE completed this admission showed LVEF 65-70% with new suspected flail middle scallop of posterior mitral leaflet., severe MR. Not previously seen on echo's during CABG admission.  Cardiology asked to evaluate patient given this new echo finding.    On exam, patient provides further clarification to above and states that prior to arrival of EMS, she recalls waking up on the floor with glass all around me and her neighbor advising her  to not move. Patient believes this glass came from a large mirror that she thinks she may have tripped and fallen into.   Today she  denies dyspnea or chest pain. Her primary concerns are area of irritation on left forearm and ongoing weakness of her right hand.   Past Medical History:  Diagnosis Date   Anxiety    Depression    Hypothyroidism     Past Surgical History:  Procedure Laterality Date   CHOLECYSTECTOMY     CORONARY ARTERY BYPASS GRAFT N/A 01/27/2024   Procedure: CORONARY ARTERY BYPASS GRAFTING X 4, USING LEFT INTERNAL MAMMARY ARTERY AND RIGHT ENDOSCOPIC HARVESTED GREATER SAPHENOUS VEIN;  Surgeon: Kerrin Elspeth BROCKS, MD;  Location: [facility] OR;  Service: Open Heart Surgery;  Laterality: N/A;   IR THORACENTESIS ASP PLEURAL SPACE W/IMG GUIDE  02/07/2024   LEFT HEART CATH AND CORONARY ANGIOGRAPHY N/A 01/24/2024   Procedure: LEFT HEART CATH AND CORONARY ANGIOGRAPHY;  Surgeon: Ladona Heinz, MD;  Location: [facility] INVASIVE CV LAB;  Service: Cardiovascular;  Laterality: N/A;   TEE WITHOUT CARDIOVERSION N/A 01/27/2024   Procedure: ECHOCARDIOGRAM, TRANSESOPHAGEAL;  Surgeon: Kerrin Elspeth BROCKS, MD;  Location: [facility] OR;  Service: Open Heart Surgery;  Laterality: N/A;     Home Medications:  Prior to Admission medications   Medication Sig Start Date End Date Taking? Authorizing Provider  aspirin  EC 81 MG tablet Take 1 tablet (81 mg total) by mouth daily. Swallow whole. 02/15/24   Raguel Con RAMAN, PA-C  atorvastatin  (LIPITOR ) 80 MG tablet Take 1 tablet (80 mg total) by mouth daily at 6 PM. 03/28/24   Fitz Matsuo, PA-C  B Complex-C (B-COMPLEX WITH VITAMIN C) tablet Take 1 tablet by mouth daily.    [provider]  cholecalciferol  (VITAMIN D ) 1000 UNITS tablet Take 1,000 Units by mouth daily.    [provider]  Cinnamon 500 MG capsule Take 500 mg by mouth daily.    [provider]  clopidogrel  (PLAVIX ) 75 MG tablet Take 1 tablet (75 mg total) by mouth daily. 03/28/24   Adreanna Fickel, PA-C  cyclobenzaprine (FLEXERIL) 5 MG tablet Take 5 mg by mouth at bedtime as needed. 03/30/24   [provider]   hydrOXYzine  (ATARAX ) 10 MG tablet Take 10 mg by mouth at bedtime. 01/03/24   [provider]  Anselm Oil 1000 MG CAPS Take 1 capsule by mouth daily.    [provider]  latanoprost  (XALATAN ) 0.005 % ophthalmic solution Place 1 drop into both eyes at bedtime. 09/16/23   [provider]  levothyroxine  (SYNTHROID ) 88 MCG tablet Take 1 tablet (88 mcg total) by mouth daily. 03/26/24   Doyt Castellana, PA-C  loperamide  (IMODIUM ) 1 MG/5ML solution Take 5 mLs by mouth daily as needed for diarrhea or loose stools.    [provider]  metoprolol  tartrate (LOPRESSOR ) 25 MG tablet Take 0.5 tablets (12.5 mg total) by mouth 2 (two) times daily. 03/28/24   Trudy Birmingham, PA-C  Omega-3 Fatty Acids (FISH OIL) 1000 MG CPDR Take 1 capsule by mouth 2 (two) times daily.    [provider]  ondansetron  (ZOFRAN ) 4 MG tablet Take 4 mg by mouth every 8 (eight) hours as needed for nausea or vomiting. 03/12/24   [provider]  pantoprazole  (PROTONIX ) 40 MG tablet Take 1 tablet (40 mg total) by mouth daily. 03/28/24   Taivon Haroon, PA-C  Polyethyl Glycol-Propyl Glycol (SYSTANE FREE OP) Place 1 drop into both eyes 2 (two) times daily.    [provider]  Red Yeast Rice 600 MG CAPS Take 2 capsules by mouth 2 (two) times daily.    [provider]  traMADol  (ULTRAM ) 50 MG tablet Take 1 tablet (50 mg total) by mouth every 6 (six) hours as needed for moderate pain (pain score 4-6). 02/15/24   Raguel Con RAMAN, PA-C  vitamin C (ASCORBIC ACID) 500 MG tablet Take 500 mg by mouth daily.    [provider]    Scheduled Meds:  aspirin  EC  81 mg Oral Daily   Chlorhexidine  Gluconate Cloth  6 each Topical Daily   clopidogrel   75 mg Oral Daily   feeding supplement  237 mL Oral BID BM   heparin   5,000 Units Subcutaneous Q8H   latanoprost   1 drop Both Eyes QHS   levothyroxine   88 mcg Oral Q0600   midodrine   10 mg Oral TID WC   nitroGLYCERIN   1 inch Topical Q8H    pantoprazole   40 mg Oral Daily   potassium chloride   40 mEq Oral Once   Continuous Infusions:  PRN Meds: ALPRAZolam , docusate sodium , polyethylene glycol  Allergies:    Allergies  Allergen Reactions   Naprosyn  [Naproxen ]     Upset stomach    Latex Rash    Social History:   Social History   Socioeconomic History   Marital status: Married    Spouse name: Not on file   Number of children: Not on file   Years of education: Not on file   Highest education level: Not on file  Occupational History   Not on file  Tobacco Use   Smoking status: Never   Smokeless tobacco: Never  Vaping Use   Vaping status: Never Used  Substance and Sexual Activity   Alcohol use: No   Drug use: Never   Sexual activity: Not on file  Other Topics Concern   Not on file  Social History Narrative   Not on file   Social Drivers of Health   Financial Resource Strain: Not on file  Food Insecurity: No Food Insecurity (04/07/2024)   Hunger Vital Sign    Worried About Running Out of Food in the Last Year: Never true    Ran Out of Food in the Last Year: Never true  Transportation Needs: No Transportation Needs (04/07/2024)   PRAPARE - Administrator, Civil Service (Medical): No    Lack of Transportation (Non-Medical): No  Physical Activity: Not on file  Stress: Not on file  Social Connections: Moderately Isolated (04/07/2024)   Social Connection and Isolation Panel    Frequency of Communication with Friends and Family: More than three times a week    Frequency of Social Gatherings with Friends and Family: More than three times a week    Attends Religious Services: 1 to 4 times per year    Active Member of [employer] or Organizations: No    Attends Banker Meetings: Never    Marital Status: Widowed  Intimate Partner Violence: Not At Risk (04/07/2024)   Humiliation, Afraid, Rape, and Kick questionnaire    Fear of Current or Ex-Partner: No    Emotionally Abused: No     Physically Abused: No    Sexually Abused: No    Family History:    Family History  Problem Relation Age of Onset   Stroke Father      ROS:  Please see the history of present illness.   All other ROS reviewed and negative.     Physical  Exam/Data: Vitals:   04/09/24 1100 04/09/24 1120 04/09/24 1200 04/09/24 1300  BP: 124/63  (!) 121/57 (!) 119/55  Pulse:      Resp: (!) 21  (!) 21 19  Temp:  98.3 F (36.8 C)    TempSrc:  Oral    SpO2:      Weight:      Height:        Intake/Output Summary (Last 24 hours) at 04/09/2024 1352 Last data filed at 04/09/2024 1300 Gross per 24 hour  Intake 1045.16 ml  Output --  Net 1045.16 ml      04/09/2024    5:00 AM 04/08/2024    5:00 AM 04/06/2024    2:44 PM  Last 3 Weights  Weight (lbs) 113 lb 15.7 oz 119 lb 7.8 oz 115 lb 1.3 oz  Weight (kg) 51.7 kg 54.2 kg 52.2 kg     Body mass index is 20.19 kg/m.  General:  Well nourished, well developed, in no acute distress HEENT: normal Neck: no JVD Vascular: No carotid bruits; Distal pulses 2+ bilaterally Cardiac:  normal S1, S2; RRR; holosystolic murmur over apex Lungs:  clear to auscultation bilaterally, no wheezing, rhonchi or rales  Abd: soft, nontender, no hepatomegaly  Ext: no edema Musculoskeletal:  No deformities, BUE and BLE strength normal and equal Skin: warm and dry  Neuro:  CNs 2-12 intact, no focal abnormalities noted. Focal weakness of right hand noted with obviously deficit in grip strength in comparison to left hand. Psych:  Normal affect   EKG:  The EKG was personally reviewed and demonstrates:  sinus rhythm, long QT Telemetry:  Telemetry was personally reviewed and demonstrates:  sinus rhythm  Relevant CV Studies:  04/07/24 TTE  IMPRESSIONS     1. Left ventricular ejection fraction, by estimation, is 65 to 70%. The  left ventricle has normal function. The left ventricle has no regional  wall motion abnormalities. Left ventricular diastolic parameters are   consistent with Grade II diastolic  dysfunction (pseudonormalization). Elevated left atrial pressure.   2. Right ventricular systolic function is normal. The right ventricular  size is normal.   3. Suspect flail middle (P2) scallop of the posterior mitral leaflet,  although chordal rupture is not clearly visualized. The mitral valve is  abnormal. Severe mitral valve regurgitation.   4. Tricuspid valve regurgitation is mild to moderate.   5. The aortic valve is tricuspid. There is mild calcification of the  aortic valve. Aortic valve regurgitation is mild. Aortic valve  sclerosis/calcification is present, without any evidence of aortic  stenosis.   Comparison(s): Changes from prior study are noted. There is now severe  eccentric mitral insufficiency. Recommend TEE if the patient is a  candidate for surgical repair or TEER (mitraClip).   FINDINGS   Left Ventricle: Left ventricular ejection fraction, by estimation, is 65  to 70%. The left ventricle has normal function. The left ventricle has no  regional wall motion abnormalities. The left ventricular internal cavity  size was normal in size. There is   no left ventricular hypertrophy. Left ventricular diastolic parameters  are consistent with Grade II diastolic dysfunction (pseudonormalization).  Elevated left atrial pressure.   Right Ventricle: The right ventricular size is normal. No increase in  right ventricular wall thickness. Right ventricular systolic function is  normal.   Left Atrium: Left atrial size was normal in size.   Right Atrium: Right atrial size was normal in size.   Pericardium: There is no evidence of pericardial effusion.  Mitral Valve: Suspect flail middle (P2) scallop of the posterior mitral  leaflet, although chordal rupture is not clearly visualized. The mitral  valve is abnormal. Mild mitral annular calcification. Severe mitral valve  regurgitation, with eccentric  anteriorly directed jet.    Tricuspid Valve: The tricuspid valve is normal in structure. Tricuspid  valve regurgitation is mild to moderate.   Aortic Valve: The aortic valve is tricuspid. There is mild calcification  of the aortic valve. Aortic valve regurgitation is mild. Aortic valve  sclerosis/calcification is present, without any evidence of aortic  stenosis. Aortic valve mean gradient  measures 3.0 mmHg. Aortic valve peak gradient measures 4.6 mmHg. Aortic  valve area, by VTI measures 2.43 cm.   Pulmonic Valve: The pulmonic valve was not well visualized. Pulmonic valve  regurgitation is not visualized. No evidence of pulmonic stenosis.   Aorta: The aortic root is normal in size and structure.   Venous: A pattern of systolic flow reversal, suggestive of severe mitral  regurgitation is recorded from the right upper pulmonary vein.   IAS/Shunts: No atrial level shunt detected by color flow Doppler.   Laboratory Data: High Sensitivity Troponin:   Recent Labs  Lab 04/07/24 0005  TROPONINIHS 97*     Chemistry Recent Labs  Lab 04/07/24 0247 04/08/24 0259 04/09/24 0734  NA 140 136 140  K 3.4* 2.8* 3.7  CL 104 104 105  CO2 24 23 26   GLUCOSE 105* 117* 130*  BUN 12 15 13   CREATININE 0.72 0.76 0.86  CALCIUM  8.2* 8.1* 8.3*  MG 1.6*  --   --   GFRNONAA >60 >60 >60  ANIONGAP 12 9 9     Recent Labs  Lab 04/06/24 1430 04/07/24 0247 04/09/24 0734  PROT 7.3 5.7* 5.6*  ALBUMIN  3.2* 3.1* 2.6*  AST 298* 176* 153*  ALT 390* 236* 185*  ALKPHOS 169* 122 121  BILITOT 3.0* 2.1* 0.9   Lipids No results for input(s): CHOL, TRIG, HDL, LABVLDL, LDLCALC, CHOLHDL in the last 168 hours.  Hematology Recent Labs  Lab 04/07/24 0247 04/08/24 0259 04/09/24 0734  WBC 15.3* 13.9* 10.8*  RBC 3.88 3.34* 3.68*  HGB 11.9* 10.1* 11.1*  HCT 35.7* 30.3* 33.8*  MCV 92.0 90.7 91.8  MCH 30.7 30.2 30.2  MCHC 33.3 33.3 32.8  RDW 15.6* 15.4 15.8*  PLT 271 238 242   Thyroid   Recent Labs  Lab  04/06/24 2125 04/07/24 0247  TSH 3.772  --   FREET4  --  1.00    BNPNo results for input(s): BNP, PROBNP in the last 168 hours.  DDimer No results for input(s): DDIMER in the last 168 hours.  Radiology/Studies:  US  Abdomen Limited RUQ (LIVER/GB) Result Date: 04/08/2024 CLINICAL DATA:  78 year old female with altered mental status. Found down. Possible sepsis. Cholecystectomy. Recent abdominal pain and nausea. EXAM: ULTRASOUND ABDOMEN LIMITED RIGHT UPPER QUADRANT COMPARISON:  CT Abdomen and Pelvis 03/14/2024. FINDINGS: Gallbladder: Surgically absent. Common bile duct: Appears decreased in size from the CT last month, estimated 6 mm diameter (normal). Liver: No focal lesion identified. Within normal limits in parenchymal echogenicity. Portal vein is patent on color Doppler imaging with normal direction of blood flow towards the liver. Other: Negative visible right kidney. No free fluid. Possible small right pleural effusion (image 17). IMPRESSION: 1. Possible small Right pleural effusion. 2. Otherwise negative right upper quadrant ultrasound status post Cholecystectomy. Electronically Signed   By: VEAR Hurst M.D.   On: 04/08/2024 09:15   ECHOCARDIOGRAM COMPLETE Result Date: 04/07/2024  ECHOCARDIOGRAM REPORT   Patient Name:   MEI Markov Date of Exam: 04/07/2024 Medical Rec #:  [id number]     Height:       63.0 in Accession #:    [id number]    Weight:       115.1 lb Date of Birth:  09-Dec-1945     BSA:          1.529 m Patient Age:    78 years      BP:           105/81 mmHg Patient Gender: F             HR:           71 bpm. Exam Location:  Inpatient Procedure: 2D Echo, Cardiac Doppler and Color Doppler (Both Spectral and Color            Flow Doppler were utilized during procedure). Indications:    Syncope  History:        Patient has prior history of Echocardiogram examinations, most                 recent 02/01/2024. Prior CABG.  Sonographer:    Jayson Gaskins Referring Phys: [id number] TINNIE FORBES FURTH  IMPRESSIONS  1. Left ventricular ejection fraction, by estimation, is 65 to 70%. The left ventricle has normal function. The left ventricle has no regional wall motion abnormalities. Left ventricular diastolic parameters are consistent with Grade II diastolic dysfunction (pseudonormalization). Elevated left atrial pressure.  2. Right ventricular systolic function is normal. The right ventricular size is normal.  3. Suspect flail middle (P2) scallop of the posterior mitral leaflet, although chordal rupture is not clearly visualized. The mitral valve is abnormal. Severe mitral valve regurgitation.  4. Tricuspid valve regurgitation is mild to moderate.  5. The aortic valve is tricuspid. There is mild calcification of the aortic valve. Aortic valve regurgitation is mild. Aortic valve sclerosis/calcification is present, without any evidence of aortic stenosis. Comparison(s): Changes from prior study are noted. There is now severe eccentric mitral insufficiency. Recommend TEE if the patient is a candidate for surgical repair or TEER (mitraClip). FINDINGS  Left Ventricle: Left ventricular ejection fraction, by estimation, is 65 to 70%. The left ventricle has normal function. The left ventricle has no regional wall motion abnormalities. The left ventricular internal cavity size was normal in size. There is  no left ventricular hypertrophy. Left ventricular diastolic parameters are consistent with Grade II diastolic dysfunction (pseudonormalization). Elevated left atrial pressure. Right Ventricle: The right ventricular size is normal. No increase in right ventricular wall thickness. Right ventricular systolic function is normal. Left Atrium: Left atrial size was normal in size. Right Atrium: Right atrial size was normal in size. Pericardium: There is no evidence of pericardial effusion. Mitral Valve: Suspect flail middle (P2) scallop of the posterior mitral leaflet, although chordal rupture is not clearly visualized. The  mitral valve is abnormal. Mild mitral annular calcification. Severe mitral valve regurgitation, with eccentric anteriorly directed jet. Tricuspid Valve: The tricuspid valve is normal in structure. Tricuspid valve regurgitation is mild to moderate. Aortic Valve: The aortic valve is tricuspid. There is mild calcification of the aortic valve. Aortic valve regurgitation is mild. Aortic valve sclerosis/calcification is present, without any evidence of aortic stenosis. Aortic valve mean gradient measures 3.0 mmHg. Aortic valve peak gradient measures 4.6 mmHg. Aortic valve area, by VTI measures 2.43 cm. Pulmonic Valve: The pulmonic valve was not well visualized. Pulmonic valve regurgitation is not visualized. No  evidence of pulmonic stenosis. Aorta: The aortic root is normal in size and structure. Venous: A pattern of systolic flow reversal, suggestive of severe mitral regurgitation is recorded from the right upper pulmonary vein. IAS/Shunts: No atrial level shunt detected by color flow Doppler.  LEFT VENTRICLE PLAX 2D LVIDd:         4.30 cm   Diastology LVIDs:         2.60 cm   LV e' medial:    9.14 cm/s LV PW:         1.00 cm   LV E/e' medial:  10.0 LV IVS:        0.90 cm   LV e' lateral:   18.10 cm/s LVOT diam:     1.80 cm   LV E/e' lateral: 5.0 LV SV:         56 LV SV Index:   37 LVOT Area:     2.54 cm  RIGHT VENTRICLE RV S prime:     9.03 cm/s TAPSE (M-mode): 1.4 cm LEFT ATRIUM             Index        RIGHT ATRIUM           Index LA Vol (A2C):   31.8 ml 20.80 ml/m  RA Area:     12.70 cm LA Vol (A4C):   22.8 ml 14.91 ml/m  RA Volume:   30.70 ml  20.08 ml/m LA Biplane Vol: 28.1 ml 18.38 ml/m  AORTIC VALVE AV Area (Vmax):    2.20 cm AV Area (Vmean):   2.06 cm AV Area (VTI):     2.43 cm AV Vmax:           107.00 cm/s AV Vmean:          83.600 cm/s AV VTI:            0.231 m AV Peak Grad:      4.6 mmHg AV Mean Grad:      3.0 mmHg LVOT Vmax:         92.60 cm/s LVOT Vmean:        67.700 cm/s LVOT VTI:           0.221 m LVOT/AV VTI ratio: 0.96  AORTA Ao Root diam: 2.60 cm MITRAL VALVE               TRICUSPID VALVE MV Area (PHT): 6.27 cm    TR Peak grad:   21.9 mmHg MV Decel Time: 121 msec    TR Vmax:        234.00 cm/s MV E velocity: 91.20 cm/s MV A velocity: 88.00 cm/s  SHUNTS MV E/A ratio:  1.04        Systemic VTI:  0.22 m                            Systemic Diam: 1.80 cm Jerel Croitoru MD Electronically signed by Jerel Balding MD Signature Date/Time: 04/07/2024/1:31:55 PM    Final    DG Hip Unilat W or Wo Pelvis 2-3 Views Right Result Date: 04/06/2024 CLINICAL DATA:  Hip pain post fall EXAM: DG HIP (WITH OR WITHOUT PELVIS) 2-3V RIGHT COMPARISON:  None Available. FINDINGS: SI joints are non widened. Pubic symphysis and rami appear intact. Limited assessment of femoral neck on the left due to positioning. No definitive fracture or malalignment on the right. IMPRESSION: No definitive fracture or malalignment on the right. Limited  assessment of the left femoral neck due to positioning. Cross-sectional imaging follow-up persistent concern for hip fracture Electronically Signed   By: Luke Bun M.D.   On: 04/06/2024 18:24   CT Maxillofacial WO CM Result Date: 04/06/2024 CLINICAL DATA:  Facial trauma, blunt EXAM: CT MAXILLOFACIAL WITHOUT CONTRAST TECHNIQUE: Multidetector CT imaging of the maxillofacial structures was performed. Multiplanar CT image reconstructions were also generated. RADIATION DOSE REDUCTION: This exam was performed according to the departmental dose-optimization program which includes automated exposure control, adjustment of the mA and/or kV according to patient size and/or use of iterative reconstruction technique. COMPARISON:  None Available. FINDINGS: Osseous: The facial bones are intact. The patient is edentulous. There are no osseous lesions present. Orbits: Normal. Sinuses: Minimal mucosal disease within the floor the right maxillary sinus. The paranasal sinuses and mastoid air cells are  otherwise clear. Soft tissues: No significant soft tissue injury demonstrated. Limited intracranial: Mild periventricular white matter disease. IMPRESSION: 1. No evidence of acute traumatic injury. Electronically Signed   By: Evalene Coho M.D.   On: 04/06/2024 16:38   CT Head Wo Contrast Result Date: 04/06/2024 CLINICAL DATA:  Mental status change, unknown cause Polytrauma, blunt EXAM: CT HEAD WITHOUT CONTRAST TECHNIQUE: Contiguous axial images were obtained from the base of the skull through the vertex without intravenous contrast. RADIATION DOSE REDUCTION: This exam was performed according to the departmental dose-optimization program which includes automated exposure control, adjustment of the mA and/or kV according to patient size and/or use of iterative reconstruction technique. COMPARISON:  None Available. FINDINGS: Brain: Age-related atrophy. Mild periventricular white matter disease. No evidence of hemorrhage, mass, acute cortical infarct or hydrocephalus. Vascular: Mild calcific plaque. Skull: Intact and unremarkable. Sinuses/Orbits: Minimal mucosal disease within the right maxillary sinus. The paranasal sinuses are otherwise clear. The orbits are unremarkable. Other: None. IMPRESSION: 1. Age-related atrophy and mild periventricular white matter disease. No evidence of acute traumatic injury. Electronically Signed   By: Evalene Coho M.D.   On: 04/06/2024 16:36   DG Chest Port 1 View Result Date: 04/06/2024 CLINICAL DATA:  Questionable sepsis.  Found down. EXAM: PORTABLE CHEST 1 VIEW COMPARISON:  03/20/2024. FINDINGS: Trachea is midline. Heart size stable. Thoracic aorta is calcified. Biapical pleural thickening. No airspace consolidation. There may be trace left pleural fluid. Chronically elevated left hemidiaphragm. IMPRESSION: Possible trace left pleural fluid. Electronically Signed   By: Newell Eke M.D.   On: 04/06/2024 14:48     Assessment and Plan:  CABG Status post coronary  artery bypass grafting (CABG) with LIMA to LAD, SVG to PDA, and SVG to ramus May 2025. Postoperative recovery complicated by shock and RV failure but had recovery for stable discharge. - Continue ASA, Plavix , Atorvastatin   Severe MR with flail middle scallop of posterior mitral leaflet TTE completed this admission showed LVEF 65-70% with new suspected flail middle scallop of posterior mitral leaflet., severe MR. No clear chordal rupture noted. Systolic flow reversal seen. These abnormalities not previously noted on May 2025 cardiac ultrasounds. - Unclear to what degree this newly noted MR has contributed to patient's hypotension/shock. She has had clinical improvement with fluid resuscitation suggesting at least a component of dehydration.  - Given fairly independent functional status up until this admission, would favor inpatient TEE to further assess patient's valve/better determine repair options. On exam, patient clearly expresses no interest in further management/evaluation of valve at this time.   Post-operative cardiogenic shock with RV failure May 2025 CABG complicated by cardiogenic shock. Echo 5/14 EF 60-65  and normal-low RV, stable symptoms at time of discharge  Postoperative atrial fibrillation Patient with brief postoperative atrial fibrillation managed with amiodarone  following CABG. Was in sinus rhythm and without symptoms at clinic visit on 6/30. Decision made at that visit to discontinue amiodarone  due to potential gastrointestinal side effects, transaminitis. - Remains in sinus rhythm this admission.  Focal right hand weakness Reported by patient today on my exam, says this developed after her recent fall and has resulted in need to eat with her left hand. Physical exam notable for right grip strength deficit.  - No other obvious neurological deficits. Will inform primary team of this concern.  Risk Assessment/Risk Scores:     For questions or updates, please contact [facility] Please consult [email or web address] for contact info under    Signed, Artist Pouch, PA-C  04/09/2024 1:52 PM

## 2024-04-09 NOTE — Plan of Care (Signed)

## 2024-04-10 ENCOUNTER — Inpatient Hospital Stay (HOSPITAL_COMMUNITY)

## 2024-04-10 DIAGNOSIS — G9340 Encephalopathy, unspecified: Secondary | ICD-10-CM | POA: Diagnosis not present

## 2024-04-10 LAB — CBC
HCT: 36.1 % (ref 36.0–46.0)
Hemoglobin: 12 g/dL (ref 12.0–15.0)
MCH: 30.8 pg (ref 26.0–34.0)
MCHC: 33.2 g/dL (ref 30.0–36.0)
MCV: 92.8 fL (ref 80.0–100.0)
Platelets: 244 K/uL (ref 150–400)
RBC: 3.89 MIL/uL (ref 3.87–5.11)
RDW: 15.9 % — ABNORMAL HIGH (ref 11.5–15.5)
WBC: 10.7 K/uL — ABNORMAL HIGH (ref 4.0–10.5)
nRBC: 0 % (ref 0.0–0.2)

## 2024-04-10 LAB — COMPREHENSIVE METABOLIC PANEL WITH GFR
ALT: 153 U/L — ABNORMAL HIGH (ref 0–44)
AST: 124 U/L — ABNORMAL HIGH (ref 15–41)
Albumin: 2.4 g/dL — ABNORMAL LOW (ref 3.5–5.0)
Alkaline Phosphatase: 103 U/L (ref 38–126)
Anion gap: 9 (ref 5–15)
BUN: 14 mg/dL (ref 8–23)
CO2: 23 mmol/L (ref 22–32)
Calcium: 8.4 mg/dL — ABNORMAL LOW (ref 8.9–10.3)
Chloride: 105 mmol/L (ref 98–111)
Creatinine, Ser: 0.86 mg/dL (ref 0.44–1.00)
GFR, Estimated: >60 mL/min (ref >60)
Glucose, Bld: 86 mg/dL (ref 70–99)
Potassium: 3.9 mmol/L (ref 3.5–5.1)
Sodium: 137 mmol/L (ref 135–145)
Total Bilirubin: 0.8 mg/dL (ref 0.0–1.2)
Total Protein: 5.3 g/dL — ABNORMAL LOW (ref 6.5–8.1)

## 2024-04-10 NOTE — Progress Notes (Signed)
 PT Cancellation Note  Patient Details Name: Kaitlyn Wright MRN: [id number] DOB: Feb 09, 1946   Cancelled Treatment:    Reason Eval/Treat Not Completed: Fatigue/lethargy limiting ability to participate. Pt declining. States she just returned from MRI and needs a break. Pt amb to/from bathroom with RN just prior to PT attempt.    Erven Sari Shaker 04/10/2024, 9:46 AM

## 2024-04-10 NOTE — Plan of Care (Signed)
   Problem: Education: Goal: Knowledge of General Education information will improve Description: Including pain rating scale, medication(s)/side effects and non-pharmacologic comfort measures Outcome: Progressing   Problem: Clinical Measurements: Goal: Ability to maintain clinical measurements within normal limits will improve Outcome: Progressing Goal: Respiratory complications will improve Outcome: Progressing   Problem: Activity: Goal: Risk for activity intolerance will decrease Outcome: Progressing   Problem: Nutrition: Goal: Adequate nutrition will be maintained Outcome: Progressing

## 2024-04-10 NOTE — TOC Initial Note (Addendum)
 Transition of Care [facility]) - Initial/Assessment Note    Patient Details  Name: Kaitlyn Wright MRN: [id number] Date of Birth: Dec 31, 1945  [facility]) CM/SW Contact:    Lauraine FORBES Saa, LCSW Phone Number: 04/10/2024, 11:41 AM  Clinical Narrative:                  11:41 AM CSW introduced self and role to patient. CSW informed patient of therapy's recommendation of patient discharging to SNF. Patient consented CSW to send referrals to SNFs within [facility] (prefers [facility], does not want to return to [place]). Patient was agreeable with discharging home with [facility] if unsatisfied with SNF options and/or improves with therapy prior to discharge. Per [facility] SNF admissions, patient was at [facility] 02/15/24-03/02/24.  3:02 PM [facility] informed CSW that patient has four days covered by insurance prior to paying $10 a day co pay for 1-20 days after, then $214 a day copay 21-100 days after. [facility] SNF informed CSW that facility would require upfront payment of $3,036 prior to discharge. CSW relayed information to patient. Patient accepted bed offer at [facility] and stated that she is able to write a check. CSW informed [facility]. Patient declined reviewing additional SNF offers.   4:52 PM Per hospitalist, patient is expected to discharge prior to Friday. CSW informed [facility]. CSW to submit insurance authorization request upon updated physical therapy note.  Expected Discharge Plan: Skilled Nursing Facility Barriers to Discharge: Continued Medical Work up, English as a second language teacher, SNF Pending bed offer   Patient Goals and CMS Choice Patient states their goals for this hospitalization and ongoing recovery are:: SNF          Expected Discharge Plan and Services In-house Referral: Clinical Social Work   Post Acute Care Choice: Skilled Nursing Facility Living arrangements for the past 2 months: Single Family Home                                       Prior Living Arrangements/Services Living arrangements for the past 2 months: Single Family Home Lives with:: Self Patient language and need for interpreter reviewed:: Yes Do you feel safe going back to the place where you live?: Yes      Need for Family Participation in Patient Care: No (Comment)     Criminal Activity/Legal Involvement Pertinent to Current Situation/Hospitalization: No - Comment as needed  Activities of Daily Living   ADL Screening (condition at time of admission) Independently performs ADLs?: No Does the patient have a NEW difficulty with bathing/dressing/toileting/self-feeding that is expected to last >3 days?: Yes (Initiates electronic notice to provider for possible OT consult) Does the patient have a NEW difficulty with getting in/out of bed, walking, or climbing stairs that is expected to last >3 days?: Yes (Initiates electronic notice to provider for possible PT consult) Does the patient have a NEW difficulty with communication that is expected to last >3 days?: No Is the patient deaf or have difficulty hearing?: No Does the patient have difficulty seeing, even when wearing glasses/contacts?: No Does the patient have difficulty concentrating, remembering, or making decisions?: Yes  Permission Sought/Granted Permission sought to share information with : Family Supports, Oceanographer granted to share information with : No (Contact information on chart)  Share Information with NAME: [name] Pauleen Khan granted to share info w AGENCY: SNF  Permission granted  to share info w Relationship: Daughter  Permission granted to share info w Contact Information: [phone]  Emotional Assessment Appearance:: Appears stated age Attitude/Demeanor/Rapport: Engaged Affect (typically observed): Accepting, Adaptable, Stable, Calm, Appropriate, Pleasant Orientation: : Oriented to Self, Oriented to Place, Oriented to  Time,  Oriented to Situation Alcohol / Substance Use: Not Applicable Psych Involvement: No (comment)  Admission diagnosis:  Delirium [R41.0] Acute encephalopathy [G93.40] Hypotension [I95.9] Patient Active Problem List   Diagnosis Date Noted   Acute encephalopathy 04/06/2024   Rhabdomyolysis 04/06/2024   Elevated liver enzymes 04/06/2024   QT prolongation 04/06/2024   CAD (coronary artery disease) 04/06/2024   Hypotension 04/06/2024   Postoperative atrial fibrillation (HCC) 03/19/2024   S/P CABG x 4 01/27/2024   Anxiety and depression 01/25/2024   Hypothyroidism 01/25/2024   NSTEMI (non-ST elevated myocardial infarction) (HCC) 01/23/2024   Elevated troponin 01/23/2024   PCP:  Arloa Elsie SAUNDERS, MD Pharmacy:   [id number] [place],  - [address] AT [facility] [address] [place] [address] Phone: [phone] Fax: [phone]     Social Drivers of Health (SDOH) Social History: SDOH Screenings   Food Insecurity: No Food Insecurity (04/07/2024)  Housing: Low Risk  (04/07/2024)  Transportation Needs: No Transportation Needs (04/07/2024)  Utilities: Not At Risk (04/07/2024)  Social Connections: Moderately Isolated (04/07/2024)  Tobacco Use: Low Risk  (04/06/2024)   SDOH Interventions:     Readmission Risk Interventions     No data to display

## 2024-04-10 NOTE — Progress Notes (Signed)
 PROGRESS NOTE  Kaitlyn Wright FMW:[id number] DOB: 1946-08-19 DOA: 04/06/2024 PCP: Arloa Elsie SAUNDERS, MD   LOS: 4 days   Brief Narrative / Interim history: 78 year old female with history of CAD status post CABG in May 2025, postop PAF, elevated LFTs (status post amiodarone  discontinuation end of June), anxiety, depression, hypothyroidism comes into the hospital with acute encephalopathy, falls.  Apparently postoperatively she was on midodrine , amiodarone , but on follow-up with cardiology as an outpatient on 6/30 patient seem to be somewhat confused about what she was supposed to take, and for simplification of her home regimen some of the medications were discontinued such as amiodarone , midodrine , torsemide /potassium but she was continued on aspirin , Plavix , atorvastatin , metoprolol .  At that time he was also noted that she was having nausea, decreased oral intake.  Following that she was seen by cardiothoracic surgery and doing well.  She presents now to the hospital with falls, confusion and found on the ground.  It is unclear how long she was down and apparently a neighbor saw her 3 days prior to admission.  She was admitted to the hospital and required ICU due to soft blood pressures and requirements for low-dose vasopressors.  She was restarted on midodrine , improved, and transferred to the hospitalist service on 7/22.  Subjective / 24h Interval events: Very perseverant this morning about as finding her shoes.  She denies any chest pain, denies any shortness of breath, no nausea or vomiting.  She complains of right arm weakness to the point that she is unable to eat breakfast anymore and unable to coordinate that arm.  She also complaining of numbness of digits 3 4 and 5.  She denies any pain.  Assesement and Plan: Principal Problem:   Acute encephalopathy Active Problems:   Rhabdomyolysis   Elevated liver enzymes   QT prolongation   CAD (coronary artery disease)   Hypotension  Principal  problem Shock, hypotension -etiology not entirely clear, perhaps due to dehydration, recent falls, perhaps GI illness with nausea and poor p.o. intake.  Initially admitted to the ICU and placed on pressors, now weaned off and her midodrine  was resumed - Blood pressure is much improved today into the 120s-130s systolic. - Will monitor trends, okay to hold metoprolol  per cardiology - Cortisol was checked 7/19, appropriately elevated at 42.1.  TSH unremarkable at 3.7 with normal free T4.  Active problems New severe mitral regurgitation -echocardiogram done 7/19 shows LVEF 65-70%, no WMA, grade 2 diastolic dysfunction.  RV was normal.  She has severe mitral valve regurgitation which appears to be new.  Cardiology consulted and evaluated patient 7/21, then signed off.  She appears to be asymptomatic with this, and per cardiology, patient does not want a TEE at this point or further workup, and she will be monitored clinically as an outpatient.    CAD status post CABG-no chest pain, this is stable, continue aspirin , Plavix .  Hypothyroidism-continue Synthroid   Hypokalemia-replenish potassium and continue to monitor  Elevated LFTs-flat/downward trend, possibly shock liver.  Right upper quadrant ultrasound fairly unremarkable, she has a history of cholecystectomy  Weakness, falls-she had hip bruising on admission, x-ray without fractures.  PT evaluated patient, recommending SNF.  Consult TOC  Right arm numbness, weakness -onset unclear, she tells me it is since they put this blood pressure cuff on, then later since she  fell at home, and at 1 point told me she is not sure when it started but maybe 2 to 3 days ago.  Given unclarity and potential associated  with a fall, obtain an MR of the brain and C-spine.  She tells me she will decline the studies because it is all from her pressure cuff  Anxiety/depression -continue home Xanax   Scheduled Meds:  aspirin  EC  81 mg Oral Daily   Chlorhexidine   Gluconate Cloth  6 each Topical Daily   clopidogrel   75 mg Oral Daily   feeding supplement  237 mL Oral BID BM   heparin   5,000 Units Subcutaneous Q8H   latanoprost   1 drop Both Eyes QHS   levothyroxine   88 mcg Oral Q0600   midodrine   10 mg Oral TID WC   pantoprazole   40 mg Oral Daily   potassium chloride   40 mEq Oral Once   Continuous Infusions: PRN Meds:.ALPRAZolam , docusate sodium , polyethylene glycol  Current Outpatient Medications  Medication Instructions   ascorbic acid (VITAMIN C) 500 mg, Daily   aspirin  EC 81 mg, Oral, Daily, Swallow whole.   atorvastatin  (LIPITOR ) 80 mg, Oral, Daily-1800   B Complex-C (B-COMPLEX WITH VITAMIN C) tablet 1 tablet, Daily   cholecalciferol  (VITAMIN D ) 1,000 Units, Daily   Cinnamon 500 mg, Daily   clopidogrel  (PLAVIX ) 75 mg, Oral, Daily   cyclobenzaprine (FLEXERIL) 5 mg, At bedtime PRN   hydrOXYzine  (ATARAX ) 10 mg, Daily at bedtime   Krill Oil 1000 MG CAPS 1 capsule, Daily   latanoprost  (XALATAN ) 0.005 % ophthalmic solution 1 drop, Daily at bedtime   levothyroxine  (SYNTHROID ) 88 mcg, Oral, Daily   loperamide  (IMODIUM ) 1 MG/5ML solution 5 mLs, Daily PRN   metoprolol  tartrate (LOPRESSOR ) 12.5 mg, Oral, 2 times daily   Omega-3 Fatty Acids (FISH OIL) 1000 MG CPDR 1 capsule, 2 times daily   ondansetron  (ZOFRAN ) 4 mg, Oral, Every 8 hours PRN   pantoprazole  (PROTONIX ) 40 mg, Oral, Daily   Polyethyl Glycol-Propyl Glycol (SYSTANE FREE OP) 1 drop, 2 times daily   Red Yeast Rice 600 MG CAPS 2 capsules, 2 times daily   traMADol  (ULTRAM ) 50 mg, Oral, Every 6 hours PRN    Diet Orders (From admission, onward)     Start     Ordered   04/08/24 0849  Diet regular Room service appropriate? Yes; Fluid consistency: Thin  Diet effective now       Question Answer Comment  Room service appropriate? Yes   Fluid consistency: Thin      04/08/24 0848            DVT prophylaxis: heparin  injection 5,000 Units Start: 04/07/24 0600 SCDs Start: 04/06/24  2351   Lab Results  Component Value Date   PLT 244 04/10/2024      Code Status: Full Code  Family Communication: No family at bedside  Status is: Inpatient Remains inpatient appropriate because: Severity of illness, needs SNF   Level of care: Telemetry Cardiac  Consultants:  PCCM Cardiology  Objective: Vitals:   04/10/24 0308 04/10/24 0400 04/10/24 0453 04/10/24 0600  BP:  (!) 121/53  (!) 134/56  Pulse:  60  63  Resp:  18  19  Temp: 98.1 F (36.7 C)     TempSrc: Axillary     SpO2:  98%  99%  Weight:   51.5 kg   Height:        Intake/Output Summary (Last 24 hours) at 04/10/2024 0605 Last data filed at 04/10/2024 0453 Gross per 24 hour  Intake 854.11 ml  Output 150 ml  Net 704.11 ml   Wt Readings from Last 3 Encounters:  04/10/24 51.5 kg  03/20/24 52.2  kg  03/19/24 52.2 kg    Examination:  Constitutional: NAD Eyes: no scleral icterus ENMT: Mucous membranes are moist.  Neck: normal, supple Respiratory: clear to auscultation bilaterally, no wheezing, no crackles. Normal respiratory effort. No accessory muscle use.  Cardiovascular: Regular rate and rhythm, no murmurs / rubs / gallops. No LE edema.  Abdomen: non distended, no tenderness. Bowel sounds positive.  Musculoskeletal: no clubbing / cyanosis.  Neuro: Noticeable right-sided upper extremity weakness, dysmetria, decreased sensation on digits 3 4 and 5   Data Reviewed: I have independently reviewed following labs and imaging studies   CBC Recent Labs  Lab 04/06/24 1430 04/07/24 0002 04/07/24 0005 04/07/24 0247 04/08/24 0259 04/09/24 0734 04/10/24 0437  WBC 16.5*  --  14.7* 15.3* 13.9* 10.8* 10.7*  HGB 13.5   < > 11.0* 11.9* 10.1* 11.1* 12.0  HCT 39.6   < > 33.7* 35.7* 30.3* 33.8* 36.1  PLT 332  --  235 271 238 242 244  MCV 91.0  --  92.8 92.0 90.7 91.8 92.8  MCH 31.0  --  30.3 30.7 30.2 30.2 30.8  MCHC 34.1  --  32.6 33.3 33.3 32.8 33.2  RDW 15.5  --  15.6* 15.6* 15.4 15.8* 15.9*   LYMPHSABS 0.8  --   --   --   --   --   --   MONOABS 1.1*  --   --   --   --   --   --   EOSABS 0.0  --   --   --   --   --   --   BASOSABS 0.0  --   --   --   --   --   --    < > = values in this interval not displayed.    Recent Labs  Lab 04/06/24 1430 04/06/24 1442 04/06/24 2125 04/06/24 2317 04/07/24 0002 04/07/24 0005 04/07/24 0247 04/08/24 0259 04/09/24 0734 04/10/24 0437  NA 138  --   --   --  140  --  140 136 140 137  K 4.1  --   --   --  3.1*  --  3.4* 2.8* 3.7 3.9  CL 96*  --   --   --   --   --  104 104 105 105  CO2 28  --   --   --   --   --  24 23 26 23   GLUCOSE 127*  --   --   --   --   --  105* 117* 130* 86  BUN 11  --   --   --   --   --  12 15 13 14   CREATININE 0.89  --   --   --   --  0.66 0.72 0.76 0.86 0.86  CALCIUM  9.2  --   --   --   --   --  8.2* 8.1* 8.3* 8.4*  AST 298*  --   --   --   --   --  176*  --  153* 124*  ALT 390*  --   --   --   --   --  236*  --  185* 153*  ALKPHOS 169*  --   --   --   --   --  122  --  121 103  BILITOT 3.0*  --   --   --   --   --  2.1*  --  0.9 0.8  ALBUMIN  3.2*  --   --   --   --   --  3.1*  --  2.6* 2.4*  MG  --   --   --   --   --   --  1.6*  --   --   --   PROCALCITON  --   --   --   --   --  0.13  --   --   --   --   LATICACIDVEN  --  1.8  --   --   --  1.3  --   --   --   --   INR  --   --   --  1.2  --   --   --   --   --   --   TSH  --   --  3.772  --   --   --   --   --   --   --   AMMONIA  --   --   --   --   --   --  29  --   --   --     ------------------------------------------------------------------------------------------------------------------ No results for input(s): CHOL, HDL, LDLCALC, TRIG, CHOLHDL, LDLDIRECT in the last 72 hours.  Lab Results  Component Value Date   HGBA1C 5.6 01/24/2024   ------------------------------------------------------------------------------------------------------------------ No results for input(s): TSH, T4TOTAL, T3FREE, THYROIDAB in the last  72 hours.  Invalid input(s): FREET3  Cardiac Enzymes No results for input(s): CKMB, TROPONINI, MYOGLOBIN in the last 168 hours.  Invalid input(s): CK ------------------------------------------------------------------------------------------------------------------ No results found for: BNP  CBG: Recent Labs  Lab 04/07/24 0027 04/08/24 2314  GLUCAP 97 136*    Recent Results (from the past 240 hours)  Blood Culture (routine x 2)     Status: None (Preliminary result)   Collection Time: 04/06/24  2:35 PM   Specimen: BLOOD  Result Value Ref Range Status   Specimen Description BLOOD RIGHT ANTECUBITAL  Final   Special Requests   Final    BOTTLES DRAWN AEROBIC AND ANAEROBIC Blood Culture results may not be optimal due to an inadequate volume of blood received in culture bottles   Culture   Final    NO GROWTH 3 DAYS Performed at [facility], [address], [place], [place] [address]    Report Status PENDING  Incomplete  Blood Culture (routine x 2)     Status: Abnormal   Collection Time: 04/06/24  3:30 PM   Specimen: BLOOD LEFT ARM  Result Value Ref Range Status   Specimen Description BLOOD LEFT ARM  Final   Special Requests   Final    BOTTLES DRAWN AEROBIC AND ANAEROBIC Blood Culture adequate volume   Culture  Setup Time   Final    GRAM POSITIVE COCCI ANAEROBIC BOTTLE ONLY CRITICAL RESULT CALLED TO, READ BACK BY AND VERIFIED WITH: PHARMD C AMEND 04/07/2024 @ 2249 BY AB    Culture (A)  Final    STAPHYLOCOCCUS EPIDERMIDIS THE SIGNIFICANCE OF ISOLATING THIS ORGANISM FROM A SINGLE SET OF BLOOD CULTURES WHEN MULTIPLE SETS ARE DRAWN IS UNCERTAIN. PLEASE NOTIFY THE MICROBIOLOGY DEPARTMENT WITHIN ONE WEEK IF SPECIATION AND SENSITIVITIES ARE REQUIRED. Performed at [facility], [address] [place], [place] [address]    Report Status 04/09/2024 FINAL  Final  Blood Culture ID Panel (Reflexed)     Status: Abnormal   Collection Time: 04/06/24  3:30 PM   Result Value Ref Range  Status   Enterococcus faecalis NOT DETECTED NOT DETECTED Final   Enterococcus Faecium NOT DETECTED NOT DETECTED Final   Listeria monocytogenes NOT DETECTED NOT DETECTED Final   Staphylococcus species DETECTED (A) NOT DETECTED Final    Comment: CRITICAL RESULT CALLED TO, READ BACK BY AND VERIFIED WITH: PHARMD C AMEND 04/07/2024 @ 2249 BY AB    Staphylococcus aureus (BCID) NOT DETECTED NOT DETECTED Final   Staphylococcus epidermidis DETECTED (A) NOT DETECTED Final    Comment: Methicillin (oxacillin) resistant coagulase negative staphylococcus. Possible blood culture contaminant (unless isolated from more than one blood culture draw or clinical case suggests pathogenicity). No antibiotic treatment is indicated for blood  culture contaminants. CRITICAL RESULT CALLED TO, READ BACK BY AND VERIFIED WITH: PHARMD C AMEND 04/07/2024 @ 2249 BY AB    Staphylococcus lugdunensis NOT DETECTED NOT DETECTED Final   Streptococcus species NOT DETECTED NOT DETECTED Final   Streptococcus agalactiae NOT DETECTED NOT DETECTED Final   Streptococcus pneumoniae NOT DETECTED NOT DETECTED Final   Streptococcus pyogenes NOT DETECTED NOT DETECTED Final   A.calcoaceticus-baumannii NOT DETECTED NOT DETECTED Final   Bacteroides fragilis NOT DETECTED NOT DETECTED Final   Enterobacterales NOT DETECTED NOT DETECTED Final   Enterobacter cloacae complex NOT DETECTED NOT DETECTED Final   Escherichia coli NOT DETECTED NOT DETECTED Final   Klebsiella aerogenes NOT DETECTED NOT DETECTED Final   Klebsiella oxytoca NOT DETECTED NOT DETECTED Final   Klebsiella pneumoniae NOT DETECTED NOT DETECTED Final   Proteus species NOT DETECTED NOT DETECTED Final   Salmonella species NOT DETECTED NOT DETECTED Final   Serratia marcescens NOT DETECTED NOT DETECTED Final   Haemophilus influenzae NOT DETECTED NOT DETECTED Final   Neisseria meningitidis NOT DETECTED NOT DETECTED Final   Pseudomonas aeruginosa NOT DETECTED  NOT DETECTED Final   Stenotrophomonas maltophilia NOT DETECTED NOT DETECTED Final   Candida albicans NOT DETECTED NOT DETECTED Final   Candida auris NOT DETECTED NOT DETECTED Final   Candida glabrata NOT DETECTED NOT DETECTED Final   Candida krusei NOT DETECTED NOT DETECTED Final   Candida parapsilosis NOT DETECTED NOT DETECTED Final   Candida tropicalis NOT DETECTED NOT DETECTED Final   Cryptococcus neoformans/gattii NOT DETECTED NOT DETECTED Final   Methicillin resistance mecA/C DETECTED (A) NOT DETECTED Final    Comment: CRITICAL RESULT CALLED TO, READ BACK BY AND VERIFIED WITH: PHARMD C AMEND 04/07/2024 @ 2249 BY AB Performed at [facility], [address] [place], [place] [address]   MRSA Next Gen by PCR, Nasal     Status: None   Collection Time: 04/06/24 11:50 PM   Specimen: Nasal Mucosa; Nasal Swab  Result Value Ref Range Status   MRSA by PCR Next Gen NOT DETECTED NOT DETECTED Final    Comment: (NOTE) The GeneXpert MRSA Assay (FDA approved for NASAL specimens only), is one component of a comprehensive MRSA colonization surveillance program. It is not intended to diagnose MRSA infection nor to guide or monitor treatment for MRSA infections. Test performance is not FDA approved in patients less than 62 years old. Performed at [facility], [address] [place], [place] [address]      Radiology Studies: No results found.   Nilda Fendt, MD, PhD [facility]  Between 7 am - 7 pm I am available, please contact me via Amion (for emergencies) or Securechat (non urgent messages)  Between 7 pm - 7 am I am not available, please contact night coverage MD/APP via Amion

## 2024-04-10 NOTE — NC FL2 (Signed)
 [place]  MEDICAID FL2 LEVEL OF CARE FORM     IDENTIFICATION  Patient Name: Kaitlyn Wright Birthdate: [birth year] Sex: female Admission Date (Current Location): 04/06/2024  [facility] and [place] Number:  Producer, television/film/video and Address:  [facility] [place], [address], [place], [place] [address]      Provider Number: [id number]  Attending Physician Name and Address:  Trixie Nilda HERO, MD  Relative Name and Phone Number:  [name] Boettcher; Daughter; [phone]    Current Level of Care: Hospital Recommended Level of Care: Skilled Nursing Facility Prior Approval Number:    Date Approved/Denied:   PASRR Number: [id number]  Discharge Plan: SNF    Current Diagnoses: Patient Active Problem List   Diagnosis Date Noted   Acute encephalopathy 04/06/2024   Rhabdomyolysis 04/06/2024   Elevated liver enzymes 04/06/2024   QT prolongation 04/06/2024   CAD (coronary artery disease) 04/06/2024   Hypotension 04/06/2024   Postoperative atrial fibrillation (HCC) 03/19/2024   S/P CABG x 4 01/27/2024   Anxiety and depression 01/25/2024   Hypothyroidism 01/25/2024   NSTEMI (non-ST elevated myocardial infarction) (HCC) 01/23/2024   Elevated troponin 01/23/2024    Orientation RESPIRATION BLADDER Height & Weight     Self, Time, Place, Situation  Normal (Room Air) Continent Weight: 113 lb 8.6 oz (51.5 kg) Height:  5' 3 (160 cm)  BEHAVIORAL SYMPTOMS/MOOD NEUROLOGICAL BOWEL NUTRITION STATUS      Continent Diet (Please see discharge summary)  AMBULATORY STATUS COMMUNICATION OF NEEDS Skin   Limited Assist Verbally Normal                       Personal Care Assistance Level of Assistance  Bathing, Feeding, Dressing Bathing Assistance: Limited assistance Feeding assistance: Limited assistance Dressing Assistance: Limited assistance     Functional Limitations Info             SPECIAL CARE FACTORS FREQUENCY  PT (By licensed PT), OT (By licensed OT)     PT  Frequency: 5x OT Frequency: 5x            Contractures Contractures Info: Not present    Additional Factors Info  Code Status, Allergies, Insulin  Sliding Scale Code Status Info: Full Code Allergies Info: Naprosyn  (naproxen ) and Latex   Insulin  Sliding Scale Info: Please see discharge summary       Current Medications (04/10/2024):  This is the current hospital active medication list Current Facility-Administered Medications  Medication Dose Route Frequency Provider Last Rate Last Admin   ALPRAZolam  (XANAX ) tablet 0.25 mg  0.25 mg Oral BID PRN Hunsucker, Donnice SAUNDERS, MD   0.25 mg at 04/09/24 2342   aspirin  EC tablet 81 mg  81 mg Oral Daily Autry, Lauren E, PA-C   81 mg at 04/10/24 0800   Chlorhexidine  Gluconate Cloth 2 % PADS 6 each  6 each Topical Daily Albustami, Omar M, MD   6 each at 04/10/24 0801   clopidogrel  (PLAVIX ) tablet 75 mg  75 mg Oral Daily Hunsucker, Donnice SAUNDERS, MD   75 mg at 04/10/24 0800   docusate sodium  (COLACE) capsule 100 mg  100 mg Oral BID PRN Autry, Lauren E, PA-C       feeding supplement (ENSURE PLUS HIGH PROTEIN) liquid 237 mL  237 mL Oral BID BM Hunsucker, Donnice SAUNDERS, MD   237 mL at 04/10/24 0933   heparin  injection 5,000 Units  5,000 Units Subcutaneous Q8H Autry, Lauren E, PA-C   5,000 Units at 04/10/24  9488   latanoprost  (XALATAN ) 0.005 % ophthalmic solution 1 drop  1 drop Both Eyes QHS Mannam, Praveen, MD   1 drop at 04/09/24 2058   levothyroxine  (SYNTHROID ) tablet 88 mcg  88 mcg Oral Q0600 Sundil, Subrina, MD   88 mcg at 04/10/24 0511   midodrine  (PROAMATINE ) tablet 10 mg  10 mg Oral TID WC Autry, Lauren E, PA-C   10 mg at 04/10/24 1144   pantoprazole  (PROTONIX ) EC tablet 40 mg  40 mg Oral Daily Autry, Lauren E, PA-C   40 mg at 04/10/24 0800   polyethylene glycol (MIRALAX  / GLYCOLAX ) packet 17 g  17 g Oral Daily PRN Autry, Lauren E, PA-C       potassium chloride  SA (KLOR-CON  M) CR tablet 40 mEq  40 mEq Oral Once A[place]ura, Emily T, MD         Discharge  Medications: Please see discharge summary for a list of discharge medications.  Relevant Imaging Results:  Relevant Lab Results:   Additional Information SS#; [id number]  Lauraine FORBES Saa, LCSW

## 2024-04-11 DIAGNOSIS — G9340 Encephalopathy, unspecified: Secondary | ICD-10-CM | POA: Diagnosis not present

## 2024-04-11 LAB — CULTURE, BLOOD (ROUTINE X 2): Culture: NO GROWTH

## 2024-04-11 LAB — COMPREHENSIVE METABOLIC PANEL WITH GFR
ALT: 138 U/L — ABNORMAL HIGH (ref 0–44)
AST: 113 U/L — ABNORMAL HIGH (ref 15–41)
Albumin: 2.6 g/dL — ABNORMAL LOW (ref 3.5–5.0)
Alkaline Phosphatase: 105 U/L (ref 38–126)
Anion gap: 10 (ref 5–15)
BUN: 12 mg/dL (ref 8–23)
CO2: 24 mmol/L (ref 22–32)
Calcium: 8.8 mg/dL — ABNORMAL LOW (ref 8.9–10.3)
Chloride: 104 mmol/L (ref 98–111)
Creatinine, Ser: 1.04 mg/dL — ABNORMAL HIGH (ref 0.44–1.00)
GFR, Estimated: 54 mL/min — ABNORMAL LOW (ref >60)
Glucose, Bld: 93 mg/dL (ref 70–99)
Potassium: 4 mmol/L (ref 3.5–5.1)
Sodium: 138 mmol/L (ref 135–145)
Total Bilirubin: 0.9 mg/dL (ref 0.0–1.2)
Total Protein: 5.7 g/dL — ABNORMAL LOW (ref 6.5–8.1)

## 2024-04-11 LAB — CBC
HCT: 36.6 % (ref 36.0–46.0)
Hemoglobin: 11.9 g/dL — ABNORMAL LOW (ref 12.0–15.0)
MCH: 30.5 pg (ref 26.0–34.0)
MCHC: 32.5 g/dL (ref 30.0–36.0)
MCV: 93.8 fL (ref 80.0–100.0)
Platelets: 265 K/uL (ref 150–400)
RBC: 3.9 MIL/uL (ref 3.87–5.11)
RDW: 16 % — ABNORMAL HIGH (ref 11.5–15.5)
WBC: 8.9 K/uL (ref 4.0–10.5)
nRBC: 0 % (ref 0.0–0.2)

## 2024-04-11 LAB — MAGNESIUM: Magnesium: 1.8 mg/dL (ref 1.7–2.4)

## 2024-04-11 LAB — PHOSPHORUS: Phosphorus: 2 mg/dL — ABNORMAL LOW (ref 2.5–4.6)

## 2024-04-11 MED ORDER — ACETAMINOPHEN 325 MG PO TABS
650.0000 mg | ORAL_TABLET | Freq: Four times a day (QID) | ORAL | Status: DC | PRN
  Administered 2024-04-11 – 2024-04-16 (×3): 650 mg via ORAL
  Filled 2024-04-11 (×3): qty 2

## 2024-04-11 MED ORDER — K PHOS MONO-SOD PHOS DI & MONO 155-852-130 MG PO TABS
500.0000 mg | ORAL_TABLET | Freq: Once | ORAL | Status: AC
  Administered 2024-04-11: 500 mg via ORAL
  Filled 2024-04-11: qty 2

## 2024-04-11 NOTE — Progress Notes (Signed)
 Physical Therapy Treatment Patient Details Name: Kaitlyn Wright MRN: [id number] DOB: 1946/06/27 Today's Date: 04/11/2024   History of Present Illness Pt is an 78 y.o. female presenting with AMS, hypotensive 7/18 after being found down by her neighbor. Workup showed leukocytosis (14.7), lactate 1.3. CPK 855, Cr 0.66, UA with 0-5 WBC, CXR with trace L pleural effusion. PMH significant for CAD s/p CABG 5/9, afib, elevated LFTs, anxiety, depression, hypothyroidism, HLD.    PT Comments  The pt is making gradual progress towards her PT goals. She was able to progress to only needing minA to transition supine to sit EOB and to transfer to stand today, demonstrating and reporting deficits in R UE strength to push her trunk up to sit from supine. She reports her R middle, ring, and pinky fingers have been numb and her hand has been weak since falling and hitting it at home. Educated pt to work on squeezing towel rolls to try to improve her strength. She progressed to ambulating x3 bouts ~12-35 ft each bout before fatiguing and needing to sit to rest. She demonstrates deficits in endurance and balance that place her at risk for falls. Will continue to follow acutely. Noted a bloody mucous drainage from her peri area while ambulating and then after trying to have a BM. Notified RN who notified MD.   If plan is discharge home, recommend the following: Assistance with cooking/housework;Direct supervision/assist for medications management;Direct supervision/assist for financial management;Assist for transportation;Help with stairs or ramp for entrance;Supervision due to cognitive status;A little help with walking and/or transfers;A little help with bathing/dressing/bathroom   Can travel by private vehicle     Yes  Equipment Recommendations  Rolling walker (2 wheels) (vs rollator)    Recommendations for Other Services       Precautions / Restrictions Precautions Precautions: Fall Precaution/Restrictions  Comments: no sternal precautions per secure chat Dr. Lucas 7/23, can gradually increase lifting & UE exercises as tolerated Restrictions Weight Bearing Restrictions Per Provider Order: No     Mobility  Bed Mobility Overal bed mobility: Needs Assistance Bed Mobility: Supine to Sit     Supine to sit: Min assist, HOB elevated, Used rails     General bed mobility comments: Cued provided to reach L hand to R rail and get R elbow > hand under body to push up to sit up, needing minA at trunk to complete.    Transfers Overall transfer level: Needs assistance Equipment used: Rolling walker (2 wheels) Transfers: Sit to/from Stand Sit to Stand: Min assist           General transfer comment: Cues provided to scoot to edge of seat and push up with one hand on sitting surface and one hand on RW. MinA needed to power up to stand each rep, 1x from EOB, 1x from recliner, and 1x from toilet. Cues needed to back up to seat for bil legs to touch seat before reaching back and sitting to transfer stand to sit.    Ambulation/Gait Ambulation/Gait assistance: Contact guard assist Gait Distance (Feet): 35 Feet (x3 bouts of ~30 ft > ~35 ft > ~12 ft) Assistive device: Rolling walker (2 wheels) Gait Pattern/deviations: Step-through pattern, Decreased step length - right, Decreased step length - left, Shuffle, Narrow base of support, Trunk flexed Gait velocity: slowed Gait velocity interpretation: <1.31 ft/sec, indicative of household ambulator   General Gait Details: Repeated VCs provided to remain proximal to RW, take wider steps, and stand upright. Momentary success noted. No LOB, CGA for  safety.   Stairs             Wheelchair Mobility     Tilt Bed    Modified Rankin (Stroke Patients Only)       Balance Overall balance assessment: Needs assistance Sitting-balance support: No upper extremity supported, Feet supported Sitting balance-Leahy Scale: Fair Sitting balance - Comments:  Static sitting EOB with supervision for safety   Standing balance support: During functional activity, Bilateral upper extremity supported, Reliant on assistive device for balance Standing balance-Leahy Scale: Poor Standing balance comment: reliant on RW                            Communication Communication Communication: Impaired Factors Affecting Communication: Hearing impaired  Cognition Arousal: Alert Behavior During Therapy: Anxious   PT - Cognitive impairments: Safety/Judgement, Problem solving, Awareness, Sequencing, Memory, Attention                       PT - Cognition Comments: Pt tangential at times and needs redirecting. Pt repeats her concerns and fear of falling and needs encouragement and repeated education of precautions in place to ensure her safety. Pt reports she has not been OOB yet, however she got up with PT on 7/19 and reportedly had been going to the bathroom with nursing Following commands: Impaired Following commands impaired: Follows multi-step commands with increased time, Follows multi-step commands inconsistently, Only follows one step commands consistently    Cueing Cueing Techniques: Verbal cues, Tactile cues  Exercises      General Comments General comments (skin integrity, edema, etc.): pleth unreliable while ambulating, in 90s% on RA at rest with good pleth; encouraged pt to sit up and get OOB as much as able      Pertinent Vitals/Pain Pain Assessment Pain Assessment: Faces Faces Pain Scale: Hurts little more Pain Location: buttocks, peri area Pain Descriptors / Indicators: Sore, Discomfort Pain Intervention(s): Limited activity within patient's tolerance, Monitored during session, Repositioned, Other (comment) (placed pillows under buttocks in chair)    Home Living                          Prior Function            PT Goals (current goals can now be found in the care plan section) Acute Rehab PT  Goals Patient Stated Goal: to improve and not fall PT Goal Formulation: With patient Time For Goal Achievement: 04/21/24 Potential to Achieve Goals: Fair Progress towards PT goals: Progressing toward goals    Frequency    Min 2X/week      PT Plan      Co-evaluation              AM-PAC PT 6 Clicks Mobility   Outcome Measure  Help needed turning from your back to your side while in a flat bed without using bedrails?: None Help needed moving from lying on your back to sitting on the side of a flat bed without using bedrails?: A Little Help needed moving to and from a bed to a chair (including a wheelchair)?: A Little Help needed standing up from a chair using your arms (e.g., wheelchair or bedside chair)?: A Little Help needed to walk in hospital room?: A Little Help needed climbing 3-5 steps with a railing? : Total 6 Click Score: 17    End of Session Equipment Utilized During Treatment: Gait belt Activity Tolerance:  Patient tolerated treatment well Patient left: with call bell/phone within reach;in chair;with chair alarm set Nurse Communication: Mobility status;Other (comment) (bloody mucous from buttocks) PT Visit Diagnosis: Unsteadiness on feet (R26.81);Other abnormalities of gait and mobility (R26.89);Muscle weakness (generalized) (M62.81);History of falling (Z91.81);Repeated falls (R29.6);Difficulty in walking, not elsewhere classified (R26.2);Adult, failure to thrive (R62.7);Pain Pain - part of body:  (peri area)     Time: 9183-9148 PT Time Calculation (min) (ACUTE ONLY): 35 min  Charges:    $Gait Training: 8-22 mins $Therapeutic Activity: 8-22 mins PT General Charges $$ ACUTE PT VISIT: 1 Visit                     Theo Ferretti, PT, DPT Acute Rehabilitation Services  Office: [phone]    Theo CHRISTELLA Ferretti 04/11/2024, 10:22 AM

## 2024-04-11 NOTE — TOC Progression Note (Signed)
 Transition of Care [facility]) - Progression Note    Patient Details  Name: Kaitlyn Wright MRN: [id number] Date of Birth: [birth year]  [facility] CM/SW Contact  Lauraine FORBES Saa, LCSW Phone Number: 04/11/2024, 1:21 PM  Clinical Narrative:     1:21 PM CSW submitted SNF insurance authorization (reference [id number]) for [facility]. SNF insurance authorization remains pending.  Expected Discharge Plan: Skilled Nursing Facility Barriers to Discharge: Continued Medical Work up, English as a second language teacher               Expected Discharge Plan and Services In-house Referral: Clinical Social Work   Post Acute Care Choice: Skilled Nursing Facility Living arrangements for the past 2 months: Single Family Home                                       Social Drivers of Health (SDOH) Interventions SDOH Screenings   Food Insecurity: No Food Insecurity (04/07/2024)  Housing: Low Risk  (04/07/2024)  Transportation Needs: No Transportation Needs (04/07/2024)  Utilities: Not At Risk (04/07/2024)  Social Connections: Moderately Isolated (04/07/2024)  Tobacco Use: Low Risk  (04/06/2024)    Readmission Risk Interventions     No data to display

## 2024-04-11 NOTE — Plan of Care (Signed)
   Problem: Education: Goal: Knowledge of General Education information will improve Description Including pain rating scale, medication(s)/side effects and non-pharmacologic comfort measures Outcome: Progressing

## 2024-04-11 NOTE — Progress Notes (Signed)
 Occupational Therapy Treatment Patient Details Name: Kaitlyn Wright MRN: [id number] DOB: 1945-09-23 Today's Date: 04/11/2024   History of present illness Pt is an 78 y.o. female presenting with AMS, hypotensive 7/18 after being found down by her neighbor. Workup showed leukocytosis (14.7), lactate 1.3. CPK 855, Cr 0.66, UA with 0-5 WBC, CXR with trace L pleural effusion. PMH significant for CAD s/p CABG 5/9, afib, elevated LFTs, anxiety, depression, hypothyroidism, HLD.   OT comments  Patient received up in recliner and agreeable to work with OT from recliner and declined further mobility due to fatigue from working with PT. Patient required setup to perform grooming tasks seated in recliner and asked for exercises to RUE hand due to numbness and decreased FMC and grasp strength. Patient was provided pink foam and instructions for grasp/release exercises and level one therapy putty. Patient instructed on exercises to increase [facility] and grasp. Patient will benefit from continued inpatient follow up therapy, <3 hours/day.  Acute OT to continue to follow to address established goals to facilitate DC to next venue of care.         If plan is discharge home, recommend the following:  A lot of help with walking and/or transfers;A lot of help with bathing/dressing/bathroom;Assistance with cooking/housework;Direct supervision/assist for financial management;Direct supervision/assist for medications management;Assist for transportation;Help with stairs or ramp for entrance   Equipment Recommendations  Other (comment) (defer)    Recommendations for Other Services      Precautions / Restrictions Precautions Precautions: Fall Precaution/Restrictions Comments: no sternal precautions per secure chat Dr. Lucas 7/23, can gradually increase lifting & UE exercises as tolerated Restrictions Weight Bearing Restrictions Per Provider Order: No       Mobility Bed Mobility Overal bed mobility: Needs Assistance              General bed mobility comments: OOB in recliner    Transfers Overall transfer level: Needs assistance                 General transfer comment: declined addressing transfers, fatigued from working with PT     Balance                                           ADL either performed or assessed with clinical judgement   ADL Overall ADL's : Needs assistance/impaired     Grooming: Wash/dry hands;Wash/dry face;Oral care;Set up;Sitting                                 General ADL Comments: focused on RUE hand exercises    Extremity/Trunk Assessment Upper Extremity Assessment Upper Extremity Assessment: Right hand dominant;RUE deficits/detail RUE Deficits / Details: digits 3,4, and 5 numbness and decreased strength RUE Sensation: decreased light touch RUE Coordination: decreased fine motor;decreased gross motor            Vision       Perception     Praxis     Communication Communication Communication: Impaired Factors Affecting Communication: Hearing impaired   Cognition Arousal: Alert Behavior During Therapy: Anxious Cognition: Cognition impaired   Orientation impairments: Time Awareness: Intellectual awareness impaired, Online awareness impaired Memory impairment (select all impairments): Short-term memory, Working memory Attention impairment (select first level of impairment): Focused attention, Sustained attention Executive functioning impairment (select all impairments): Organization, Problem solving, Sequencing, Reasoning OT -  Cognition Comments: able to give correct day of the week but believed it was June and gave wrong year                 Following commands: Impaired Following commands impaired: Follows multi-step commands with increased time, Follows multi-step commands inconsistently, Only follows one step commands consistently      Cueing   Cueing Techniques: Verbal cues, Tactile cues   Exercises Exercises: Other exercises Other Exercises Other Exercises: pink foam provided to address gross grasp Other Exercises: level one therapy putty provided with instructions on exercises to address gross grasp and [facility]    Shoulder Instructions       General Comments improvement with following commands and able to answer 3/4 orientation questions correctly    Pertinent Vitals/ Pain       Pain Assessment Pain Assessment: Faces Faces Pain Scale: Hurts little more Pain Location: buttocks, peri area, and right hand Pain Descriptors / Indicators: Sore, Discomfort Pain Intervention(s): Limited activity within patient's tolerance, Monitored during session, Repositioned  Home Living                                          Prior Functioning/Environment              Frequency  Min 2X/week        Progress Toward Goals  OT Goals(current goals can now be found in the care plan section)  Progress towards OT goals: Progressing toward goals  Acute Rehab OT Goals Patient Stated Goal: to be able to use Right hand more OT Goal Formulation: With patient Time For Goal Achievement: 04/21/24 Potential to Achieve Goals: Fair ADL Goals Pt Will Perform Grooming: with supervision;standing Pt Will Perform Lower Body Dressing: with supervision;sit to/from stand Pt Will Transfer to Toilet: with supervision;ambulating Additional ADL Goal #1: Pt will follow two step commands during ADL with min cues Additional ADL Goal #2: Pt will demonstrate improved orientation answering 4/4 orientation questions correctly without external cues  Plan      Co-evaluation                 AM-PAC OT 6 Clicks Daily Activity     Outcome Measure   Help from another person eating meals?: A Little Help from another person taking care of personal grooming?: A Little Help from another person toileting, which includes using toliet, bedpan, or urinal?: A Lot Help from another  person bathing (including washing, rinsing, drying)?: A Lot Help from another person to put on and taking off regular upper body clothing?: A Lot Help from another person to put on and taking off regular lower body clothing?: A Lot 6 Click Score: 14    End of Session    OT Visit Diagnosis: Unsteadiness on feet (R26.81);Muscle weakness (generalized) (M62.81);Pain;Other symptoms and signs involving cognitive function Pain - Right/Left: Right Pain - part of body: Hand   Activity Tolerance Patient tolerated treatment well   Patient Left in chair;with call bell/phone within reach;with chair alarm set   Nurse Communication Mobility status        Time: 9142-9077 OT Time Calculation (min): 25 min  Charges: OT General Charges $OT Visit: 1 Visit OT Treatments $Self Care/Home Management : 8-22 mins $Therapeutic Exercise: 8-22 mins  Dick Wright, OTA Acute Rehabilitation Services  Office [phone]   Kaitlyn Wright 04/11/2024, 12:06 PM

## 2024-04-11 NOTE — Progress Notes (Signed)
 Progress Note    Kaitlyn Wright  FMW:[id number] DOB: July 01, 1946  DOA: 04/06/2024 PCP: Arloa Elsie SAUNDERS, MD      Brief Narrative:    Medical records reviewed and are as summarized below:  Kaitlyn Wright is a 78 y.o. female with history of CAD status post CABG in May 2025, postop PAF, elevated LFTs (status post amiodarone  discontinuation end of June), anxiety, depression, hypothyroidism comes into the hospital with acute encephalopathy, falls. Apparently postoperatively she was on midodrine , amiodarone , but on follow-up with cardiology as an outpatient on 6/30 patient seem to be somewhat confused about what she was supposed to take, and for simplification of her home regimen some of the medications were discontinued such as amiodarone , midodrine , torsemide /potassium but she was continued on aspirin , Plavix , atorvastatin , metoprolol . At that time he was also noted that she was having nausea, decreased oral intake. Following that she was seen by cardiothoracic surgery and doing well.  She presented to the hospital with falls, confusion.  She was apparently found on the ground.  It was unclear how long she had been down for.  Apparently, a neighbor found her 3 days prior to admission. She was admitted to the hospital and required ICU due to soft blood pressures and requirements for low-dose vasopressors. She was restarted on midodrine , improved, and transferred to the hospitalist service on 7/22.      Assessment/Plan:   Principal Problem:   Acute encephalopathy Active Problems:   Rhabdomyolysis   Elevated liver enzymes   QT prolongation   CAD (coronary artery disease)   Hypotension    Body mass index is 18.94 kg/m.   Shock, hypotension -etiology not entirely clear, Initially admitted to the ICU and treated with IV vasopressor. Continue midodrine  Will consider restarting metoprolol  if blood pressure remains stable. - Cortisol was checked 7/19, appropriately elevated at 42.1.   TSH unremarkable at 3.7 with normal free T4.    New severe mitral regurgitation -echocardiogram done 7/19 shows LVEF 65-70%, no WMA, grade 2 diastolic dysfunction.  RV was normal.   Cardiology consulted and evaluated patient 7/21, then signed off.  She appears to be asymptomatic with this, and per cardiology, patient does not want a TEE at this point or further workup, and she will be monitored clinically as an outpatient.      CAD status post CABG-no chest pain, this is stable Continue aspirin  and Plavix     Hypokalemia-improved    Elevated LFTs-liver enzymes trending downward.  Probably from shock liver.   Right upper quadrant ultrasound fairly unremarkable, she has a history of cholecystectomy   Weakness, falls-she had hip bruising on admission, x-ray without fractures.  PT and OT recommended discharge to SNF.  Follow-up with TOC to assist with disposition    Right arm numbness, weakness - Cervical spondylosis, multilevel foraminal stenosis.  Thoracic right C3-C4, bilaterally at C4-C5, bilaterally at C5-C6 and left C6-C7 Degenerative disc disease of cervical spine, multi-level mild grade 1 anterolisthesis.  Findings on MRI cervical spine may be contributing to right arm weakness and numbness. Continue PT and OT    Comorbidities include anxiety, depression, hypothyroidism   Diet Order             Diet regular Room service appropriate? Yes; Fluid consistency: Thin  Diet effective now                            Consultants: Cardiologist Intensivist  Procedures: None  Medications:    aspirin  EC  81 mg Oral Daily   Chlorhexidine  Gluconate Cloth  6 each Topical Daily   clopidogrel   75 mg Oral Daily   feeding supplement  237 mL Oral BID BM   heparin   5,000 Units Subcutaneous Q8H   latanoprost   1 drop Both Eyes QHS   levothyroxine   88 mcg Oral Q0600   midodrine   10 mg Oral TID WC   pantoprazole   40 mg Oral Daily   potassium chloride   40 mEq Oral Once    Continuous Infusions:   Anti-infectives (From admission, onward)    Start     Dose/Rate Route Frequency Ordered Stop   04/07/24 0000  cefTRIAXone  (ROCEPHIN ) 1 g in sodium chloride  0.9 % 100 mL IVPB        1 g 200 mL/hr over 30 Minutes Intravenous  Once 04/06/24 2354 04/07/24 0215              Family Communication/Anticipated D/C date and plan/Code Status   DVT prophylaxis: heparin  injection 5,000 Units Start: 04/07/24 0600 SCDs Start: 04/06/24 2351     Code Status: Full Code  Family Communication: None Disposition Plan: Discharge to SNF   Status is: Inpatient Remains inpatient appropriate because: Awaiting placement to SNF       Subjective:   Interval events noted.  She feels better.  She said this is the first time she has gotten out of bed to walk.  Objective:    Vitals:   04/11/24 0800 04/11/24 1109 04/11/24 1200 04/11/24 1206  BP: (!) 135/56  (!) 127/103 (!) 145/88  Pulse: 73   93  Resp: 20  18 (!) 24  Temp:  98.4 F (36.9 C)    TempSrc:  Oral    SpO2: 98%   98%  Weight:      Height:       No data found.   Intake/Output Summary (Last 24 hours) at 04/11/2024 1255 Last data filed at 04/11/2024 0800 Gross per 24 hour  Intake 480 ml  Output 500 ml  Net -20 ml   Filed Weights   04/09/24 0500 04/10/24 0453 04/11/24 0531  Weight: 51.7 kg 51.5 kg 48.5 kg    Exam:  GEN: NAD, sitting up in the chair SKIN: Warm and dry EYES: No pallor or icterus ENT: MMM CV: RRR PULM: CTA B ABD: soft, ND, NT, +BS CNS: AAO x 3, non focal EXT: No edema or tenderness        Data Reviewed:   I have personally reviewed following labs and imaging studies:  Labs: Labs show the following:   Basic Metabolic Panel: Recent Labs  Lab 04/07/24 0247 04/08/24 0259 04/09/24 0734 04/10/24 0437 04/11/24 0411  NA 140 136 140 137 138  K 3.4* 2.8* 3.7 3.9 4.0  CL 104 104 105 105 104  CO2 24 23 26 23 24   GLUCOSE 105* 117* 130* 86 93  BUN 12 15 13 14  12   CREATININE 0.72 0.76 0.86 0.86 1.04*  CALCIUM  8.2* 8.1* 8.3* 8.4* 8.8*  MG 1.6*  --   --   --  1.8  PHOS  --   --   --   --  2.0*   GFR Estimated Creatinine Clearance: 33 mL/min (A) (by C-G formula based on SCr of 1.04 mg/dL (H)). Liver Function Tests: Recent Labs  Lab 04/06/24 1430 04/07/24 0247 04/09/24 0734 04/10/24 0437 04/11/24 0411  AST 298* 176* 153* 124* 113*  ALT 390* 236* 185* 153*  138*  ALKPHOS 169* 122 121 103 105  BILITOT 3.0* 2.1* 0.9 0.8 0.9  PROT 7.3 5.7* 5.6* 5.3* 5.7*  ALBUMIN  3.2* 3.1* 2.6* 2.4* 2.6*   No results for input(s): LIPASE, AMYLASE in the last 168 hours. Recent Labs  Lab 04/07/24 0247  AMMONIA 29   Coagulation profile Recent Labs  Lab 04/06/24 2317  INR 1.2    CBC: Recent Labs  Lab 04/06/24 1430 04/07/24 0002 04/07/24 0247 04/08/24 0259 04/09/24 0734 04/10/24 0437 04/11/24 0411  WBC 16.5*   < > 15.3* 13.9* 10.8* 10.7* 8.9  NEUTROABS 14.6*  --   --   --   --   --   --   HGB 13.5   < > 11.9* 10.1* 11.1* 12.0 11.9*  HCT 39.6   < > 35.7* 30.3* 33.8* 36.1 36.6  MCV 91.0   < > 92.0 90.7 91.8 92.8 93.8  PLT 332   < > 271 238 242 244 265   < > = values in this interval not displayed.   Cardiac Enzymes: Recent Labs  Lab 04/06/24 1430 04/07/24 0247  CKTOTAL 855* 843*   BNP (last 3 results) No results for input(s): PROBNP in the last 8760 hours. CBG: Recent Labs  Lab 04/07/24 0027 04/08/24 2314  GLUCAP 97 136*   D-Dimer: No results for input(s): DDIMER in the last 72 hours. Hgb A1c: No results for input(s): HGBA1C in the last 72 hours. Lipid Profile: No results for input(s): CHOL, HDL, LDLCALC, TRIG, CHOLHDL, LDLDIRECT in the last 72 hours. Thyroid  function studies: No results for input(s): TSH, T4TOTAL, T3FREE, THYROIDAB in the last 72 hours.  Invalid input(s): FREET3 Anemia work up: No results for input(s): VITAMINB12, FOLATE, FERRITIN, TIBC, IRON, RETICCTPCT in the  last 72 hours. Sepsis Labs: Recent Labs  Lab 04/06/24 1442 04/07/24 0005 04/07/24 0247 04/08/24 0259 04/09/24 0734 04/10/24 0437 04/11/24 0411  PROCALCITON  --  0.13  --   --   --   --   --   WBC  --  14.7*   < > 13.9* 10.8* 10.7* 8.9  LATICACIDVEN 1.8 1.3  --   --   --   --   --    < > = values in this interval not displayed.    Microbiology Recent Results (from the past 240 hours)  Blood Culture (routine x 2)     Status: None   Collection Time: 04/06/24  2:35 PM   Specimen: BLOOD  Result Value Ref Range Status   Specimen Description BLOOD RIGHT ANTECUBITAL  Final   Special Requests   Final    BOTTLES DRAWN AEROBIC AND ANAEROBIC Blood Culture results may not be optimal due to an inadequate volume of blood received in culture bottles   Culture   Final    NO GROWTH 5 DAYS Performed at [facility], [address] [place], [place] [address]    Report Status 04/11/2024 FINAL  Final  Blood Culture (routine x 2)     Status: Abnormal   Collection Time: 04/06/24  3:30 PM   Specimen: BLOOD LEFT ARM  Result Value Ref Range Status   Specimen Description BLOOD LEFT ARM  Final   Special Requests   Final    BOTTLES DRAWN AEROBIC AND ANAEROBIC Blood Culture adequate volume   Culture  Setup Time   Final    GRAM POSITIVE COCCI ANAEROBIC BOTTLE ONLY CRITICAL RESULT CALLED TO, READ BACK BY AND VERIFIED WITH: PHARMD C AMEND 04/07/2024 @  2249 BY AB    Culture (A)  Final    STAPHYLOCOCCUS EPIDERMIDIS THE SIGNIFICANCE OF ISOLATING THIS ORGANISM FROM A SINGLE SET OF BLOOD CULTURES WHEN MULTIPLE SETS ARE DRAWN IS UNCERTAIN. PLEASE NOTIFY THE MICROBIOLOGY DEPARTMENT WITHIN ONE WEEK IF SPECIATION AND SENSITIVITIES ARE REQUIRED. Performed at [facility], [address] [place], [place] [address]    Report Status 04/09/2024 FINAL  Final  Blood Culture ID Panel (Reflexed)     Status: Abnormal   Collection Time: 04/06/24  3:30 PM  Result Value Ref Range Status   Enterococcus  faecalis NOT DETECTED NOT DETECTED Final   Enterococcus Faecium NOT DETECTED NOT DETECTED Final   Listeria monocytogenes NOT DETECTED NOT DETECTED Final   Staphylococcus species DETECTED (A) NOT DETECTED Final    Comment: CRITICAL RESULT CALLED TO, READ BACK BY AND VERIFIED WITH: PHARMD C AMEND 04/07/2024 @ 2249 BY AB    Staphylococcus aureus (BCID) NOT DETECTED NOT DETECTED Final   Staphylococcus epidermidis DETECTED (A) NOT DETECTED Final    Comment: Methicillin (oxacillin) resistant coagulase negative staphylococcus. Possible blood culture contaminant (unless isolated from more than one blood culture draw or clinical case suggests pathogenicity). No antibiotic treatment is indicated for blood  culture contaminants. CRITICAL RESULT CALLED TO, READ BACK BY AND VERIFIED WITH: PHARMD C AMEND 04/07/2024 @ 2249 BY AB    Staphylococcus lugdunensis NOT DETECTED NOT DETECTED Final   Streptococcus species NOT DETECTED NOT DETECTED Final   Streptococcus agalactiae NOT DETECTED NOT DETECTED Final   Streptococcus pneumoniae NOT DETECTED NOT DETECTED Final   Streptococcus pyogenes NOT DETECTED NOT DETECTED Final   A.calcoaceticus-baumannii NOT DETECTED NOT DETECTED Final   Bacteroides fragilis NOT DETECTED NOT DETECTED Final   Enterobacterales NOT DETECTED NOT DETECTED Final   Enterobacter cloacae complex NOT DETECTED NOT DETECTED Final   Escherichia coli NOT DETECTED NOT DETECTED Final   Klebsiella aerogenes NOT DETECTED NOT DETECTED Final   Klebsiella oxytoca NOT DETECTED NOT DETECTED Final   Klebsiella pneumoniae NOT DETECTED NOT DETECTED Final   Proteus species NOT DETECTED NOT DETECTED Final   Salmonella species NOT DETECTED NOT DETECTED Final   Serratia marcescens NOT DETECTED NOT DETECTED Final   Haemophilus influenzae NOT DETECTED NOT DETECTED Final   Neisseria meningitidis NOT DETECTED NOT DETECTED Final   Pseudomonas aeruginosa NOT DETECTED NOT DETECTED Final   Stenotrophomonas  maltophilia NOT DETECTED NOT DETECTED Final   Candida albicans NOT DETECTED NOT DETECTED Final   Candida auris NOT DETECTED NOT DETECTED Final   Candida glabrata NOT DETECTED NOT DETECTED Final   Candida krusei NOT DETECTED NOT DETECTED Final   Candida parapsilosis NOT DETECTED NOT DETECTED Final   Candida tropicalis NOT DETECTED NOT DETECTED Final   Cryptococcus neoformans/gattii NOT DETECTED NOT DETECTED Final   Methicillin resistance mecA/C DETECTED (A) NOT DETECTED Final    Comment: CRITICAL RESULT CALLED TO, READ BACK BY AND VERIFIED WITH: PHARMD C AMEND 04/07/2024 @ 2249 BY AB Performed at [facility], [address], [place], [place] [address]   MRSA Next Gen by PCR, Nasal     Status: None   Collection Time: 04/06/24 11:50 PM   Specimen: Nasal Mucosa; Nasal Swab  Result Value Ref Range Status   MRSA by PCR Next Gen NOT DETECTED NOT DETECTED Final    Comment: (NOTE) The GeneXpert MRSA Assay (FDA approved for NASAL specimens only), is one component of a comprehensive MRSA colonization surveillance program. It is not intended to diagnose MRSA infection  nor to guide or monitor treatment for MRSA infections. Test performance is not FDA approved in patients less than 74 years old. Performed at [facility], [address], [place], [place] [address]     Procedures and diagnostic studies:  MR BRAIN WO CONTRAST Result Date: 04/10/2024 CLINICAL DATA:  Provided history: Right arm weakness. Additional history provided: Patient found down. Confusion. EXAM: MRI HEAD WITHOUT CONTRAST MRI CERVICAL SPINE WITHOUT CONTRAST TECHNIQUE: Multiplanar, multiecho pulse sequences of the brain and surrounding structures, and cervical spine, to include the craniocervical junction and cervicothoracic junction, were obtained without intravenous contrast. COMPARISON:  Head CT 04/06/2024. FINDINGS: MRI HEAD FINDINGS Brain: Generalized cerebral atrophy. Multifocal T2 FLAIR hyperintense signal  abnormality within the cerebral white matter, nonspecific but compatible with mild chronic small vessel ischemic disease. Few nonspecific punctate chronic microhemorrhages scattered within the supratentorial and infratentorial brain. There is no acute infarct. No evidence of an intracranial mass. No chronic intracranial blood products. No extra-axial fluid collection. No midline shift. Vascular: Maintained flow voids within the proximal large arterial vessels. Skull and upper cervical spine: No focal worrisome lesion. Sinuses/Orbits: No mass or acute finding within the imaged orbits. No significant paranasal sinus disease. MRI CERVICAL SPINE FINDINGS Alignment: Mild grade 1 anterolisthesis at C2-C3, C7-T1, T1-T2, T2-T3 and T3-T4. Vertebrae: Cervical vertebral body height is maintained. No significant marrow edema or focal worrisome marrow lesion. Cord: No signal abnormality identified within the cervical spinal cord. Posterior Fossa, vertebral arteries, paraspinal tissues: Posterior fossa assessed on same-day brain MRI. Flow voids preserved within portions of the cervical vertebral arteries. No paraspinal mass or collection. Disc levels: Multilevel disc degeneration, greatest at C4-C5 and C5-C6 (moderate-to-advanced at these levels). C2-C3: Mild grade 1 anterolisthesis. Slight disc uncovering. No significant disc herniation or stenosis. C3-C4: Shallow disc bulge. Uncovertebral hypertrophy (greater on the right). Facet hypertrophy (predominantly on the right). No significant spinal canal stenosis. Moderate-to-severe right neural foraminal narrowing. C4-C5: Posterior disc osteophyte complex with bilateral disc osteophyte ridge/uncinate hypertrophy. No significant spinal canal stenosis. Bilateral neural foraminal narrowing (moderate right, severe left). C5-C6: Posterior disc osteophyte complex with left greater than right disc osteophyte ridge/uncinate hypertrophy. No significant spinal canal stenosis. Bilateral neural  foraminal narrowing (moderate right, severe left). C6-C7: Posterior disc osteophyte complex with bilateral disc osteophyte ridge/uncinate hypertrophy. Ligamentum flavum thickening. Mild spinal canal stenosis. Bilateral neural foraminal narrowing (mild-to-moderate right, moderate left). C7-T1: Mild grade 1 anterolisthesis. Mild facet arthropathy (greater on the right). Slight disc uncovering. No significant disc herniation or stenosis. IMPRESSION: MRI brain: 1. No evidence of an acute intracranial abnormality. 2. Mild chronic small vessel ischemic changes within cerebral white matter. 3. Few nonspecific punctate chronic microhemorrhages scattered within the supratentorial and infratentorial brain. 4. Generalized cerebral atrophy. MRI cervical spine: 1. Cervical spondylosis as outlined within the body of the report. 2. No more than mild spinal canal stenosis. 3. Multilevel foraminal stenosis, greatest on the right at C3-C4 (moderate-to-severe), bilaterally at C4-C5 (moderate right, severe left), bilaterally at C5-C6 (moderate right, severe left) and on the left at C6-C7 (moderate). 4. Disc degeneration is greatest at C4-C5 and C5-C6 (moderate-to-advanced at these levels). 5. Mild grade 1 anterolisthesis at C2-C3, C7-T1, T1-T2, T2-T3 and T3-T4. Electronically Signed   By: Rockey Childs D.O.   On: 04/10/2024 09:19   MR CERVICAL SPINE WO CONTRAST Result Date: 04/10/2024 CLINICAL DATA:  Provided history: Right arm weakness. Additional history provided: Patient found down. Confusion. EXAM: MRI HEAD WITHOUT CONTRAST MRI CERVICAL SPINE WITHOUT CONTRAST TECHNIQUE: Multiplanar, multiecho  pulse sequences of the brain and surrounding structures, and cervical spine, to include the craniocervical junction and cervicothoracic junction, were obtained without intravenous contrast. COMPARISON:  Head CT 04/06/2024. FINDINGS: MRI HEAD FINDINGS Brain: Generalized cerebral atrophy. Multifocal T2 FLAIR hyperintense signal abnormality  within the cerebral white matter, nonspecific but compatible with mild chronic small vessel ischemic disease. Few nonspecific punctate chronic microhemorrhages scattered within the supratentorial and infratentorial brain. There is no acute infarct. No evidence of an intracranial mass. No chronic intracranial blood products. No extra-axial fluid collection. No midline shift. Vascular: Maintained flow voids within the proximal large arterial vessels. Skull and upper cervical spine: No focal worrisome lesion. Sinuses/Orbits: No mass or acute finding within the imaged orbits. No significant paranasal sinus disease. MRI CERVICAL SPINE FINDINGS Alignment: Mild grade 1 anterolisthesis at C2-C3, C7-T1, T1-T2, T2-T3 and T3-T4. Vertebrae: Cervical vertebral body height is maintained. No significant marrow edema or focal worrisome marrow lesion. Cord: No signal abnormality identified within the cervical spinal cord. Posterior Fossa, vertebral arteries, paraspinal tissues: Posterior fossa assessed on same-day brain MRI. Flow voids preserved within portions of the cervical vertebral arteries. No paraspinal mass or collection. Disc levels: Multilevel disc degeneration, greatest at C4-C5 and C5-C6 (moderate-to-advanced at these levels). C2-C3: Mild grade 1 anterolisthesis. Slight disc uncovering. No significant disc herniation or stenosis. C3-C4: Shallow disc bulge. Uncovertebral hypertrophy (greater on the right). Facet hypertrophy (predominantly on the right). No significant spinal canal stenosis. Moderate-to-severe right neural foraminal narrowing. C4-C5: Posterior disc osteophyte complex with bilateral disc osteophyte ridge/uncinate hypertrophy. No significant spinal canal stenosis. Bilateral neural foraminal narrowing (moderate right, severe left). C5-C6: Posterior disc osteophyte complex with left greater than right disc osteophyte ridge/uncinate hypertrophy. No significant spinal canal stenosis. Bilateral neural foraminal  narrowing (moderate right, severe left). C6-C7: Posterior disc osteophyte complex with bilateral disc osteophyte ridge/uncinate hypertrophy. Ligamentum flavum thickening. Mild spinal canal stenosis. Bilateral neural foraminal narrowing (mild-to-moderate right, moderate left). C7-T1: Mild grade 1 anterolisthesis. Mild facet arthropathy (greater on the right). Slight disc uncovering. No significant disc herniation or stenosis. IMPRESSION: MRI brain: 1. No evidence of an acute intracranial abnormality. 2. Mild chronic small vessel ischemic changes within cerebral white matter. 3. Few nonspecific punctate chronic microhemorrhages scattered within the supratentorial and infratentorial brain. 4. Generalized cerebral atrophy. MRI cervical spine: 1. Cervical spondylosis as outlined within the body of the report. 2. No more than mild spinal canal stenosis. 3. Multilevel foraminal stenosis, greatest on the right at C3-C4 (moderate-to-severe), bilaterally at C4-C5 (moderate right, severe left), bilaterally at C5-C6 (moderate right, severe left) and on the left at C6-C7 (moderate). 4. Disc degeneration is greatest at C4-C5 and C5-C6 (moderate-to-advanced at these levels). 5. Mild grade 1 anterolisthesis at C2-C3, C7-T1, T1-T2, T2-T3 and T3-T4. Electronically Signed   By: Rockey Childs D.O.   On: 04/10/2024 09:19               LOS: 5 days   Zanaria Morell  [facility]   Pager on [email or web address]. If 7PM-7AM, please contact night-coverage at [email or web address]     04/11/2024, 12:55 PM

## 2024-04-12 DIAGNOSIS — G9340 Encephalopathy, unspecified: Secondary | ICD-10-CM | POA: Diagnosis not present

## 2024-04-12 LAB — URINALYSIS, W/ REFLEX TO CULTURE (INFECTION SUSPECTED)
Bilirubin Urine: NEGATIVE
Glucose, UA: NEGATIVE mg/dL
Ketones, ur: NEGATIVE mg/dL
Nitrite: NEGATIVE
Protein, ur: NEGATIVE mg/dL
Specific Gravity, Urine: 1.008 (ref 1.005–1.030)
pH: 7 (ref 5.0–8.0)

## 2024-04-12 LAB — RAPID URINE DRUG SCREEN, HOSP PERFORMED
Amphetamines: NOT DETECTED
Barbiturates: NOT DETECTED
Benzodiazepines: POSITIVE — AB
Cocaine: NOT DETECTED
Opiates: NOT DETECTED
Tetrahydrocannabinol: NOT DETECTED

## 2024-04-12 MED ORDER — MIDODRINE HCL 10 MG PO TABS: 10.0000 mg | ORAL_TABLET | Freq: Three times a day (TID) | ORAL | 0 refills | Status: DC

## 2024-04-12 NOTE — Progress Notes (Signed)
 Mobility Specialist Progress Note:    04/12/24 1143  Mobility  Activity Ambulated with assistance in room;Ambulated with assistance in hallway  Level of Assistance Contact guard assist, steadying assist  Assistive Device Front wheel walker  Distance Ambulated (ft) 200 ft  Activity Response Tolerated well  Mobility Referral Yes  Mobility visit 1 Mobility  Mobility Specialist Start Time (ACUTE ONLY) 1143  Mobility Specialist Stop Time (ACUTE ONLY) 1210  Mobility Specialist Time Calculation (min) (ACUTE ONLY) 27 min   Pt agreeable to session but seems somewhat confused and very anxious/ nervous about wellbeing and going home w/ no therapy. Needs constant cueing and reassuring to stay on track and help her relax. No c/o any symptoms during session. Pt able to move well and stand well w/ CGA. Left pt in recliner w/ CSW and all needs met.   Venetia Keel Mobility Specialist Please Neurosurgeon or Rehab Office at [phone]

## 2024-04-12 NOTE — Discharge Summary (Incomplete)
 Physician Discharge Summary   Patient: Kaitlyn Wright MRN: [id number] DOB: 1946-08-10  Admit date:     04/06/2024  Discharge date: 04/12/24  Discharge Physician: AIDA CHO   PCP: Arloa Elsie SAUNDERS, MD   Recommendations at discharge:  {Tip this will not be part of the note when signed- Example include specific recommendations for outpatient follow-up, pending tests to follow-up on. (Optional):26781} Follow-up with PCP in 1 week  Discharge Diagnoses: Principal Problem:   Acute encephalopathy Active Problems:   Rhabdomyolysis   Elevated liver enzymes   QT prolongation   CAD (coronary artery disease)   Hypotension  Resolved Problems:   * No resolved hospital problems. *  Hospital Course:  MARYAH Wright is a 78 y.o. female with history of CAD status post CABG in May 2025, postop PAF, elevated LFTs (status post amiodarone  discontinuation end of June), anxiety, depression, hypothyroidism comes into the hospital with acute encephalopathy, falls. Apparently postoperatively she was on midodrine , amiodarone , but on follow-up with cardiology as an outpatient on 6/30 patient seem to be somewhat confused about what she was supposed to take, and for simplification of her home regimen some of the medications were discontinued such as amiodarone , midodrine , torsemide /potassium but she was continued on aspirin , Plavix , atorvastatin , metoprolol . At that time he was also noted that she was having nausea, decreased oral intake. Following that she was seen by cardiothoracic surgery and doing well.  She presented to the hospital with falls, confusion.  She was apparently found on the ground.  It was unclear how long she had been down for.  Apparently, a neighbor found her 3 days prior to admission. She was admitted to the hospital and required ICU due to soft blood pressures and requirements for low-dose vasopressors. She was restarted on midodrine , improved, and transferred to the hospitalist service on  7/22.       Assessment and Plan:   Shock, hypotension -etiology not entirely clear, Initially admitted to the ICU and treated with IV vasopressor. Continue midodrine  - Cortisol was checked 7/19, appropriately elevated at 42.1.  TSH unremarkable at 3.7 with normal free T4.     New severe mitral regurgitation -echocardiogram done 7/19 shows LVEF 65-70%, no WMA, grade 2 diastolic dysfunction.  RV was normal.   Cardiology consulted and evaluated patient 7/21, then signed off.  She appears to be asymptomatic with this, and per cardiology, patient does not want a TEE at this point or further workup.  Outpatient follow-up with [facility] cardiology group in [place].,      CAD status post CABG-no chest pain, this is stable Continue aspirin  and Plavix      Hypokalemia-improved     Elevated LFTs-liver enzymes trending downward.  Probably from shock liver.   Right upper quadrant ultrasound fairly unremarkable, she has a history of cholecystectomy    Weakness, falls-she had hip bruising on admission, x-ray without fractures.  PT and OT recommended discharge to SNF.  However, patient refuses to go to SNF.  She prefers to go home with home health therapy.  She said all she needs is for somebody to come to my house about 2-3 times a week for a few hours .  She said she only tripped and fell in her room, and her daughter has gone to her room to remove anything that will be a trip or fall hazard. She understands that official recommendation was for her to go to SNF and she may be at increased risk for falls at home at this time.  She will be discharged home with home PT and OT.    Right arm numbness, weakness - Cervical spondylosis, multilevel foraminal stenosis.  Thoracic right C3-C4, bilaterally at C4-C5, bilaterally at C5-C6 and left C6-C7 Degenerative disc disease of cervical spine, multi-level mild grade 1 anterolisthesis.  Findings on MRI cervical spine may be contributing to right arm weakness  and numbness. I told patient persistent right arm numbness may be due to degenerative disc disease of the cervical spine.  However, Patient said there is nothing wrong with my neck.  My neck is fine. Outpatient follow-up with PCP recommended   Comorbidities include anxiety, depression, hypothyroidism     Condition is improved.  She is asymptomatic and she is deemed stable for discharge to SNF.  I called [name], daughter, at [phone] at 10:55 AM today but there was no response.    {Tip this will not be part of the note when signed Body mass index is 20.05 kg/m. , ,  (Optional):26781}   Consultants: Cardiologist Procedures performed: None Disposition: Home health Diet recommendation:  Discharge Diet Orders (From admission, onward)     Start     Ordered   04/12/24 0000  Diet - low sodium heart healthy        04/12/24 1059           Cardiac diet DISCHARGE MEDICATION: Allergies as of 04/12/2024       Reactions   Naprosyn  [naproxen ]    Upset stomach   Latex Rash        Medication List     STOP taking these medications    Cinnamon 500 MG capsule   Krill Oil 1000 MG Caps   metoprolol  tartrate 25 MG tablet Commonly known as: LOPRESSOR    Red Yeast Rice 600 MG Caps       TAKE these medications    ascorbic acid 500 MG tablet Commonly known as: VITAMIN C Take 500 mg by mouth daily.   aspirin  EC 81 MG tablet Take 1 tablet (81 mg total) by mouth daily. Swallow whole.   atorvastatin  80 MG tablet Commonly known as: LIPITOR  Take 1 tablet (80 mg total) by mouth daily at 6 PM.   B-complex with vitamin C tablet Take 1 tablet by mouth daily.   cholecalciferol  1000 units tablet Commonly known as: VITAMIN D  Take 1,000 Units by mouth daily.   clopidogrel  75 MG tablet Commonly known as: PLAVIX  Take 1 tablet (75 mg total) by mouth daily.   cyclobenzaprine 5 MG tablet Commonly known as: FLEXERIL Take 5 mg by mouth at bedtime as needed.   Fish Oil 1000  MG Cpdr Take 1 capsule by mouth 2 (two) times daily.   hydrOXYzine  10 MG tablet Commonly known as: ATARAX  Take 10 mg by mouth at bedtime.   latanoprost  0.005 % ophthalmic solution Commonly known as: XALATAN  Place 1 drop into both eyes at bedtime.   levothyroxine  88 MCG tablet Commonly known as: SYNTHROID  Take 1 tablet (88 mcg total) by mouth daily.   loperamide  1 MG/5ML solution Commonly known as: IMODIUM  Take 5 mLs by mouth daily as needed for diarrhea or loose stools.   midodrine  10 MG tablet Commonly known as: PROAMATINE  Take 1 tablet (10 mg total) by mouth 3 (three) times daily with meals.   ondansetron  4 MG tablet Commonly known as: ZOFRAN  Take 4 mg by mouth every 8 (eight) hours as needed for nausea or vomiting.   pantoprazole  40 MG tablet Commonly known as: PROTONIX  Take 1 tablet (40  mg total) by mouth daily.   SYSTANE FREE OP Place 1 drop into both eyes 2 (two) times daily.   traMADol  50 MG tablet Commonly known as: ULTRAM  Take 1 tablet (50 mg total) by mouth every 6 (six) hours as needed for moderate pain (pain score 4-6).        Contact information for after-discharge care     Destination     [facility] [place] .   Service: Skilled Nursing Contact information: [address] [place]  [phone]                    Discharge Exam: Fredricka Weights   04/11/24 0531 04/11/24 1700 04/12/24 0352  Weight: 48.5 kg 51.8 kg 51.3 kg   GEN: NAD SKIN: Warm and dry EYES: No pallor or icterus ENT: MMM CV: RRR PULM: CTA B ABD: soft, ND, NT, +BS CNS: AAO x 3, non focal EXT: No edema or tenderness   Condition at discharge: good  The results of significant diagnostics from this hospitalization (including imaging, microbiology, ancillary and laboratory) are listed below for reference.   Imaging Studies: MR BRAIN WO CONTRAST Result Date: 04/10/2024 CLINICAL DATA:  Provided history: Right arm weakness.  Additional history provided: Patient found down. Confusion. EXAM: MRI HEAD WITHOUT CONTRAST MRI CERVICAL SPINE WITHOUT CONTRAST TECHNIQUE: Multiplanar, multiecho pulse sequences of the brain and surrounding structures, and cervical spine, to include the craniocervical junction and cervicothoracic junction, were obtained without intravenous contrast. COMPARISON:  Head CT 04/06/2024. FINDINGS: MRI HEAD FINDINGS Brain: Generalized cerebral atrophy. Multifocal T2 FLAIR hyperintense signal abnormality within the cerebral white matter, nonspecific but compatible with mild chronic small vessel ischemic disease. Few nonspecific punctate chronic microhemorrhages scattered within the supratentorial and infratentorial brain. There is no acute infarct. No evidence of an intracranial mass. No chronic intracranial blood products. No extra-axial fluid collection. No midline shift. Vascular: Maintained flow voids within the proximal large arterial vessels. Skull and upper cervical spine: No focal worrisome lesion. Sinuses/Orbits: No mass or acute finding within the imaged orbits. No significant paranasal sinus disease. MRI CERVICAL SPINE FINDINGS Alignment: Mild grade 1 anterolisthesis at C2-C3, C7-T1, T1-T2, T2-T3 and T3-T4. Vertebrae: Cervical vertebral body height is maintained. No significant marrow edema or focal worrisome marrow lesion. Cord: No signal abnormality identified within the cervical spinal cord. Posterior Fossa, vertebral arteries, paraspinal tissues: Posterior fossa assessed on same-day brain MRI. Flow voids preserved within portions of the cervical vertebral arteries. No paraspinal mass or collection. Disc levels: Multilevel disc degeneration, greatest at C4-C5 and C5-C6 (moderate-to-advanced at these levels). C2-C3: Mild grade 1 anterolisthesis. Slight disc uncovering. No significant disc herniation or stenosis. C3-C4: Shallow disc bulge. Uncovertebral hypertrophy (greater on the right). Facet hypertrophy  (predominantly on the right). No significant spinal canal stenosis. Moderate-to-severe right neural foraminal narrowing. C4-C5: Posterior disc osteophyte complex with bilateral disc osteophyte ridge/uncinate hypertrophy. No significant spinal canal stenosis. Bilateral neural foraminal narrowing (moderate right, severe left). C5-C6: Posterior disc osteophyte complex with left greater than right disc osteophyte ridge/uncinate hypertrophy. No significant spinal canal stenosis. Bilateral neural foraminal narrowing (moderate right, severe left). C6-C7: Posterior disc osteophyte complex with bilateral disc osteophyte ridge/uncinate hypertrophy. Ligamentum flavum thickening. Mild spinal canal stenosis. Bilateral neural foraminal narrowing (mild-to-moderate right, moderate left). C7-T1: Mild grade 1 anterolisthesis. Mild facet arthropathy (greater on the right). Slight disc uncovering. No significant disc herniation or stenosis. IMPRESSION: MRI brain: 1. No evidence of an acute intracranial abnormality. 2. Mild chronic small vessel ischemic  changes within cerebral white matter. 3. Few nonspecific punctate chronic microhemorrhages scattered within the supratentorial and infratentorial brain. 4. Generalized cerebral atrophy. MRI cervical spine: 1. Cervical spondylosis as outlined within the body of the report. 2. No more than mild spinal canal stenosis. 3. Multilevel foraminal stenosis, greatest on the right at C3-C4 (moderate-to-severe), bilaterally at C4-C5 (moderate right, severe left), bilaterally at C5-C6 (moderate right, severe left) and on the left at C6-C7 (moderate). 4. Disc degeneration is greatest at C4-C5 and C5-C6 (moderate-to-advanced at these levels). 5. Mild grade 1 anterolisthesis at C2-C3, C7-T1, T1-T2, T2-T3 and T3-T4. Electronically Signed   By: Rockey Childs D.O.   On: 04/10/2024 09:19   MR CERVICAL SPINE WO CONTRAST Result Date: 04/10/2024 CLINICAL DATA:  Provided history: Right arm weakness.  Additional history provided: Patient found down. Confusion. EXAM: MRI HEAD WITHOUT CONTRAST MRI CERVICAL SPINE WITHOUT CONTRAST TECHNIQUE: Multiplanar, multiecho pulse sequences of the brain and surrounding structures, and cervical spine, to include the craniocervical junction and cervicothoracic junction, were obtained without intravenous contrast. COMPARISON:  Head CT 04/06/2024. FINDINGS: MRI HEAD FINDINGS Brain: Generalized cerebral atrophy. Multifocal T2 FLAIR hyperintense signal abnormality within the cerebral white matter, nonspecific but compatible with mild chronic small vessel ischemic disease. Few nonspecific punctate chronic microhemorrhages scattered within the supratentorial and infratentorial brain. There is no acute infarct. No evidence of an intracranial mass. No chronic intracranial blood products. No extra-axial fluid collection. No midline shift. Vascular: Maintained flow voids within the proximal large arterial vessels. Skull and upper cervical spine: No focal worrisome lesion. Sinuses/Orbits: No mass or acute finding within the imaged orbits. No significant paranasal sinus disease. MRI CERVICAL SPINE FINDINGS Alignment: Mild grade 1 anterolisthesis at C2-C3, C7-T1, T1-T2, T2-T3 and T3-T4. Vertebrae: Cervical vertebral body height is maintained. No significant marrow edema or focal worrisome marrow lesion. Cord: No signal abnormality identified within the cervical spinal cord. Posterior Fossa, vertebral arteries, paraspinal tissues: Posterior fossa assessed on same-day brain MRI. Flow voids preserved within portions of the cervical vertebral arteries. No paraspinal mass or collection. Disc levels: Multilevel disc degeneration, greatest at C4-C5 and C5-C6 (moderate-to-advanced at these levels). C2-C3: Mild grade 1 anterolisthesis. Slight disc uncovering. No significant disc herniation or stenosis. C3-C4: Shallow disc bulge. Uncovertebral hypertrophy (greater on the right). Facet hypertrophy  (predominantly on the right). No significant spinal canal stenosis. Moderate-to-severe right neural foraminal narrowing. C4-C5: Posterior disc osteophyte complex with bilateral disc osteophyte ridge/uncinate hypertrophy. No significant spinal canal stenosis. Bilateral neural foraminal narrowing (moderate right, severe left). C5-C6: Posterior disc osteophyte complex with left greater than right disc osteophyte ridge/uncinate hypertrophy. No significant spinal canal stenosis. Bilateral neural foraminal narrowing (moderate right, severe left). C6-C7: Posterior disc osteophyte complex with bilateral disc osteophyte ridge/uncinate hypertrophy. Ligamentum flavum thickening. Mild spinal canal stenosis. Bilateral neural foraminal narrowing (mild-to-moderate right, moderate left). C7-T1: Mild grade 1 anterolisthesis. Mild facet arthropathy (greater on the right). Slight disc uncovering. No significant disc herniation or stenosis. IMPRESSION: MRI brain: 1. No evidence of an acute intracranial abnormality. 2. Mild chronic small vessel ischemic changes within cerebral white matter. 3. Few nonspecific punctate chronic microhemorrhages scattered within the supratentorial and infratentorial brain. 4. Generalized cerebral atrophy. MRI cervical spine: 1. Cervical spondylosis as outlined within the body of the report. 2. No more than mild spinal canal stenosis. 3. Multilevel foraminal stenosis, greatest on the right at C3-C4 (moderate-to-severe), bilaterally at C4-C5 (moderate right, severe left), bilaterally at C5-C6 (moderate right, severe left) and on the left at C6-C7 (moderate). 4. Disc degeneration  is greatest at C4-C5 and C5-C6 (moderate-to-advanced at these levels). 5. Mild grade 1 anterolisthesis at C2-C3, C7-T1, T1-T2, T2-T3 and T3-T4. Electronically Signed   By: Rockey Childs D.O.   On: 04/10/2024 09:19   US  Abdomen Limited RUQ (LIVER/GB) Result Date: 04/08/2024 CLINICAL DATA:  78 year old female with altered mental  status. Found down. Possible sepsis. Cholecystectomy. Recent abdominal pain and nausea. EXAM: ULTRASOUND ABDOMEN LIMITED RIGHT UPPER QUADRANT COMPARISON:  CT Abdomen and Pelvis 03/14/2024. FINDINGS: Gallbladder: Surgically absent. Common bile duct: Appears decreased in size from the CT last month, estimated 6 mm diameter (normal). Liver: No focal lesion identified. Within normal limits in parenchymal echogenicity. Portal vein is patent on color Doppler imaging with normal direction of blood flow towards the liver. Other: Negative visible right kidney. No free fluid. Possible small right pleural effusion (image 17). IMPRESSION: 1. Possible small Right pleural effusion. 2. Otherwise negative right upper quadrant ultrasound status post Cholecystectomy. Electronically Signed   By: VEAR Hurst M.D.   On: 04/08/2024 09:15   ECHOCARDIOGRAM COMPLETE Result Date: 04/07/2024    ECHOCARDIOGRAM REPORT   Patient Name:   DIERA WIRKKALA Date of Exam: 04/07/2024 Medical Rec #:  [id number]     Height:       63.0 in Accession #:    [id number]    Weight:       115.1 lb Date of Birth:  10/04/1945     BSA:          1.529 m Patient Age:    78 years      BP:           105/81 mmHg Patient Gender: F             HR:           71 bpm. Exam Location:  Inpatient Procedure: 2D Echo, Cardiac Doppler and Color Doppler (Both Spectral and Color            Flow Doppler were utilized during procedure). Indications:    Syncope  History:        Patient has prior history of Echocardiogram examinations, most                 recent 02/01/2024. Prior CABG.  Sonographer:    Jayson Gaskins Referring Phys: [id number] TINNIE FORBES FURTH IMPRESSIONS  1. Left ventricular ejection fraction, by estimation, is 65 to 70%. The left ventricle has normal function. The left ventricle has no regional wall motion abnormalities. Left ventricular diastolic parameters are consistent with Grade II diastolic dysfunction (pseudonormalization). Elevated left atrial pressure.  2. Right  ventricular systolic function is normal. The right ventricular size is normal.  3. Suspect flail middle (P2) scallop of the posterior mitral leaflet, although chordal rupture is not clearly visualized. The mitral valve is abnormal. Severe mitral valve regurgitation.  4. Tricuspid valve regurgitation is mild to moderate.  5. The aortic valve is tricuspid. There is mild calcification of the aortic valve. Aortic valve regurgitation is mild. Aortic valve sclerosis/calcification is present, without any evidence of aortic stenosis. Comparison(s): Changes from prior study are noted. There is now severe eccentric mitral insufficiency. Recommend TEE if the patient is a candidate for surgical repair or TEER (mitraClip). FINDINGS  Left Ventricle: Left ventricular ejection fraction, by estimation, is 65 to 70%. The left ventricle has normal function. The left ventricle has no regional wall motion abnormalities. The left ventricular internal cavity size was normal in size. There is  no left ventricular hypertrophy. Left  ventricular diastolic parameters are consistent with Grade II diastolic dysfunction (pseudonormalization). Elevated left atrial pressure. Right Ventricle: The right ventricular size is normal. No increase in right ventricular wall thickness. Right ventricular systolic function is normal. Left Atrium: Left atrial size was normal in size. Right Atrium: Right atrial size was normal in size. Pericardium: There is no evidence of pericardial effusion. Mitral Valve: Suspect flail middle (P2) scallop of the posterior mitral leaflet, although chordal rupture is not clearly visualized. The mitral valve is abnormal. Mild mitral annular calcification. Severe mitral valve regurgitation, with eccentric anteriorly directed jet. Tricuspid Valve: The tricuspid valve is normal in structure. Tricuspid valve regurgitation is mild to moderate. Aortic Valve: The aortic valve is tricuspid. There is mild calcification of the aortic  valve. Aortic valve regurgitation is mild. Aortic valve sclerosis/calcification is present, without any evidence of aortic stenosis. Aortic valve mean gradient measures 3.0 mmHg. Aortic valve peak gradient measures 4.6 mmHg. Aortic valve area, by VTI measures 2.43 cm. Pulmonic Valve: The pulmonic valve was not well visualized. Pulmonic valve regurgitation is not visualized. No evidence of pulmonic stenosis. Aorta: The aortic root is normal in size and structure. Venous: A pattern of systolic flow reversal, suggestive of severe mitral regurgitation is recorded from the right upper pulmonary vein. IAS/Shunts: No atrial level shunt detected by color flow Doppler.  LEFT VENTRICLE PLAX 2D LVIDd:         4.30 cm   Diastology LVIDs:         2.60 cm   LV e' medial:    9.14 cm/s LV PW:         1.00 cm   LV E/e' medial:  10.0 LV IVS:        0.90 cm   LV e' lateral:   18.10 cm/s LVOT diam:     1.80 cm   LV E/e' lateral: 5.0 LV SV:         56 LV SV Index:   37 LVOT Area:     2.54 cm  RIGHT VENTRICLE RV S prime:     9.03 cm/s TAPSE (M-mode): 1.4 cm LEFT ATRIUM             Index        RIGHT ATRIUM           Index LA Vol (A2C):   31.8 ml 20.80 ml/m  RA Area:     12.70 cm LA Vol (A4C):   22.8 ml 14.91 ml/m  RA Volume:   30.70 ml  20.08 ml/m LA Biplane Vol: 28.1 ml 18.38 ml/m  AORTIC VALVE AV Area (Vmax):    2.20 cm AV Area (Vmean):   2.06 cm AV Area (VTI):     2.43 cm AV Vmax:           107.00 cm/s AV Vmean:          83.600 cm/s AV VTI:            0.231 m AV Peak Grad:      4.6 mmHg AV Mean Grad:      3.0 mmHg LVOT Vmax:         92.60 cm/s LVOT Vmean:        67.700 cm/s LVOT VTI:          0.221 m LVOT/AV VTI ratio: 0.96  AORTA Ao Root diam: 2.60 cm MITRAL VALVE               TRICUSPID VALVE MV Area (PHT): 6.27  cm    TR Peak grad:   21.9 mmHg MV Decel Time: 121 msec    TR Vmax:        234.00 cm/s MV E velocity: 91.20 cm/s MV A velocity: 88.00 cm/s  SHUNTS MV E/A ratio:  1.04        Systemic VTI:  0.22 m                             Systemic Diam: 1.80 cm Jerel Croitoru MD Electronically signed by Jerel Balding MD Signature Date/Time: 04/07/2024/1:31:55 PM    Final    DG Hip Unilat W or Wo Pelvis 2-3 Views Right Result Date: 04/06/2024 CLINICAL DATA:  Hip pain post fall EXAM: DG HIP (WITH OR WITHOUT PELVIS) 2-3V RIGHT COMPARISON:  None Available. FINDINGS: SI joints are non widened. Pubic symphysis and rami appear intact. Limited assessment of femoral neck on the left due to positioning. No definitive fracture or malalignment on the right. IMPRESSION: No definitive fracture or malalignment on the right. Limited assessment of the left femoral neck due to positioning. Cross-sectional imaging follow-up persistent concern for hip fracture Electronically Signed   By: Luke Bun M.D.   On: 04/06/2024 18:24   CT Maxillofacial WO CM Result Date: 04/06/2024 CLINICAL DATA:  Facial trauma, blunt EXAM: CT MAXILLOFACIAL WITHOUT CONTRAST TECHNIQUE: Multidetector CT imaging of the maxillofacial structures was performed. Multiplanar CT image reconstructions were also generated. RADIATION DOSE REDUCTION: This exam was performed according to the departmental dose-optimization program which includes automated exposure control, adjustment of the mA and/or kV according to patient size and/or use of iterative reconstruction technique. COMPARISON:  None Available. FINDINGS: Osseous: The facial bones are intact. The patient is edentulous. There are no osseous lesions present. Orbits: Normal. Sinuses: Minimal mucosal disease within the floor the right maxillary sinus. The paranasal sinuses and mastoid air cells are otherwise clear. Soft tissues: No significant soft tissue injury demonstrated. Limited intracranial: Mild periventricular white matter disease. IMPRESSION: 1. No evidence of acute traumatic injury. Electronically Signed   By: Evalene Coho M.D.   On: 04/06/2024 16:38   CT Head Wo Contrast Result Date: 04/06/2024 CLINICAL DATA:   Mental status change, unknown cause Polytrauma, blunt EXAM: CT HEAD WITHOUT CONTRAST TECHNIQUE: Contiguous axial images were obtained from the base of the skull through the vertex without intravenous contrast. RADIATION DOSE REDUCTION: This exam was performed according to the departmental dose-optimization program which includes automated exposure control, adjustment of the mA and/or kV according to patient size and/or use of iterative reconstruction technique. COMPARISON:  None Available. FINDINGS: Brain: Age-related atrophy. Mild periventricular white matter disease. No evidence of hemorrhage, mass, acute cortical infarct or hydrocephalus. Vascular: Mild calcific plaque. Skull: Intact and unremarkable. Sinuses/Orbits: Minimal mucosal disease within the right maxillary sinus. The paranasal sinuses are otherwise clear. The orbits are unremarkable. Other: None. IMPRESSION: 1. Age-related atrophy and mild periventricular white matter disease. No evidence of acute traumatic injury. Electronically Signed   By: Evalene Coho M.D.   On: 04/06/2024 16:36   DG Chest Port 1 View Result Date: 04/06/2024 CLINICAL DATA:  Questionable sepsis.  Found down. EXAM: PORTABLE CHEST 1 VIEW COMPARISON:  03/20/2024. FINDINGS: Trachea is midline. Heart size stable. Thoracic aorta is calcified. Biapical pleural thickening. No airspace consolidation. There may be trace left pleural fluid. Chronically elevated left hemidiaphragm. IMPRESSION: Possible trace left pleural fluid. Electronically Signed   By: Newell Eke M.D.   On: 04/06/2024  14:48   DG Chest 2 View Result Date: 03/20/2024 CLINICAL DATA:  cabg x 4 EXAM: CHEST - 2 VIEW COMPARISON:  02/14/2024 FINDINGS: Lungs clear. Heart size and mediastinal contours are within normal limits. CABG markers. Aortic Atherosclerosis (ICD10-170.0). No effusion. Sternotomy wires. IMPRESSION: No acute cardiopulmonary disease. Electronically Signed   By: JONETTA Faes M.D.   On: 03/20/2024 13:49    CT ABDOMEN PELVIS W CONTRAST Result Date: 03/14/2024 CLINICAL DATA:  Acute abdominal pain, history of recent coronary bypass surgery, nausea EXAM: CT ABDOMEN AND PELVIS WITH CONTRAST TECHNIQUE: Multidetector CT imaging of the abdomen and pelvis was performed using the standard protocol following bolus administration of intravenous contrast. RADIATION DOSE REDUCTION: This exam was performed according to the departmental dose-optimization program which includes automated exposure control, adjustment of the mA and/or kV according to patient size and/or use of iterative reconstruction technique. CONTRAST:  50mL OMNIPAQUE  IOHEXOL  300 MG/ML  SOLN COMPARISON:  None Available. FINDINGS: Lower chest: Bibasilar atelectasis is seen. Hepatobiliary: Gallbladder has been surgically removed. Biliary ductal dilatation is seen consistent with the post cholecystectomy state. Pancreas: Unremarkable. No pancreatic ductal dilatation or surrounding inflammatory changes. Spleen: Multiple calcified granulomas are identified throughout the spleen. Adrenals/Urinary Tract: Adrenal glands are within normal limits. Kidneys show normal enhancement pattern bilaterally. No obstructive changes are seen. The bladder is partially distended. Stomach/Bowel: Scattered diverticular change of the colon is noted. Fecal material is noted throughout the colon without obstructive change. The appendix is within normal limits. Small bowel and stomach are unremarkable. Vascular/Lymphatic: Atherosclerotic calcifications of the aorta are noted. No adenopathy is seen. Reproductive: Uterus and bilateral adnexa are unremarkable. Other: No abdominal wall hernia or abnormality. No abdominopelvic ascites. Musculoskeletal: No acute or significant osseous findings. IMPRESSION: Changes of prior granulomatous disease. Diverticulosis without diverticulitis. No other focal abnormality is noted. Electronically Signed   By: Oneil Devonshire M.D.   On: 03/14/2024 01:19     Microbiology: Results for orders placed or performed during the hospital encounter of 04/06/24  Blood Culture (routine x 2)     Status: None   Collection Time: 04/06/24  2:35 PM   Specimen: BLOOD  Result Value Ref Range Status   Specimen Description BLOOD RIGHT ANTECUBITAL  Final   Special Requests   Final    BOTTLES DRAWN AEROBIC AND ANAEROBIC Blood Culture results may not be optimal due to an inadequate volume of blood received in culture bottles   Culture   Final    NO GROWTH 5 DAYS Performed at [facility], [address], [place], [place] [address]    Report Status 04/11/2024 FINAL  Final  Blood Culture (routine x 2)     Status: Abnormal   Collection Time: 04/06/24  3:30 PM   Specimen: BLOOD LEFT ARM  Result Value Ref Range Status   Specimen Description BLOOD LEFT ARM  Final   Special Requests   Final    BOTTLES DRAWN AEROBIC AND ANAEROBIC Blood Culture adequate volume   Culture  Setup Time   Final    GRAM POSITIVE COCCI ANAEROBIC BOTTLE ONLY CRITICAL RESULT CALLED TO, READ BACK BY AND VERIFIED WITH: PHARMD C AMEND 04/07/2024 @ 2249 BY AB    Culture (A)  Final    STAPHYLOCOCCUS EPIDERMIDIS THE SIGNIFICANCE OF ISOLATING THIS ORGANISM FROM A SINGLE SET OF BLOOD CULTURES WHEN MULTIPLE SETS ARE DRAWN IS UNCERTAIN. PLEASE NOTIFY THE MICROBIOLOGY DEPARTMENT WITHIN ONE WEEK IF SPECIATION AND SENSITIVITIES ARE REQUIRED. Performed at [facility], [address], [place], [place] [address]    Report Status 04/09/2024 FINAL  Final  Blood Culture ID Panel (Reflexed)     Status: Abnormal   Collection Time: 04/06/24  3:30 PM  Result Value Ref Range Status   Enterococcus faecalis NOT DETECTED NOT DETECTED Final   Enterococcus Faecium NOT DETECTED NOT DETECTED Final   Listeria monocytogenes NOT DETECTED NOT DETECTED Final   Staphylococcus species DETECTED (A) NOT DETECTED Final    Comment: CRITICAL RESULT CALLED TO, READ BACK BY AND VERIFIED WITH: PHARMD C AMEND  04/07/2024 @ 2249 BY AB    Staphylococcus aureus (BCID) NOT DETECTED NOT DETECTED Final   Staphylococcus epidermidis DETECTED (A) NOT DETECTED Final    Comment: Methicillin (oxacillin) resistant coagulase negative staphylococcus. Possible blood culture contaminant (unless isolated from more than one blood culture draw or clinical case suggests pathogenicity). No antibiotic treatment is indicated for blood  culture contaminants. CRITICAL RESULT CALLED TO, READ BACK BY AND VERIFIED WITH: PHARMD C AMEND 04/07/2024 @ 2249 BY AB    Staphylococcus lugdunensis NOT DETECTED NOT DETECTED Final   Streptococcus species NOT DETECTED NOT DETECTED Final   Streptococcus agalactiae NOT DETECTED NOT DETECTED Final   Streptococcus pneumoniae NOT DETECTED NOT DETECTED Final   Streptococcus pyogenes NOT DETECTED NOT DETECTED Final   A.calcoaceticus-baumannii NOT DETECTED NOT DETECTED Final   Bacteroides fragilis NOT DETECTED NOT DETECTED Final   Enterobacterales NOT DETECTED NOT DETECTED Final   Enterobacter cloacae complex NOT DETECTED NOT DETECTED Final   Escherichia coli NOT DETECTED NOT DETECTED Final   Klebsiella aerogenes NOT DETECTED NOT DETECTED Final   Klebsiella oxytoca NOT DETECTED NOT DETECTED Final   Klebsiella pneumoniae NOT DETECTED NOT DETECTED Final   Proteus species NOT DETECTED NOT DETECTED Final   Salmonella species NOT DETECTED NOT DETECTED Final   Serratia marcescens NOT DETECTED NOT DETECTED Final   Haemophilus influenzae NOT DETECTED NOT DETECTED Final   Neisseria meningitidis NOT DETECTED NOT DETECTED Final   Pseudomonas aeruginosa NOT DETECTED NOT DETECTED Final   Stenotrophomonas maltophilia NOT DETECTED NOT DETECTED Final   Candida albicans NOT DETECTED NOT DETECTED Final   Candida auris NOT DETECTED NOT DETECTED Final   Candida glabrata NOT DETECTED NOT DETECTED Final   Candida krusei NOT DETECTED NOT DETECTED Final   Candida parapsilosis NOT DETECTED NOT DETECTED Final    Candida tropicalis NOT DETECTED NOT DETECTED Final   Cryptococcus neoformans/gattii NOT DETECTED NOT DETECTED Final   Methicillin resistance mecA/C DETECTED (A) NOT DETECTED Final    Comment: CRITICAL RESULT CALLED TO, READ BACK BY AND VERIFIED WITH: PHARMD C AMEND 04/07/2024 @ 2249 BY AB Performed at [facility] Lab, [address] [place], [place] [address]   MRSA Next Gen by PCR, Nasal     Status: None   Collection Time: 04/06/24 11:50 PM   Specimen: Nasal Mucosa; Nasal Swab  Result Value Ref Range Status   MRSA by PCR Next Gen NOT DETECTED NOT DETECTED Final    Comment: (NOTE) The GeneXpert MRSA Assay (FDA approved for NASAL specimens only), is one component of a comprehensive MRSA colonization surveillance program. It is not intended to diagnose MRSA infection nor to guide or monitor treatment for MRSA infections. Test performance is not FDA approved in patients less than 110 years old. Performed at [facility], [address] [place], [place] [address]     Labs: CBC: Recent Labs  Lab 04/06/24 1430 04/07/24 0002 04/07/24 0247 04/08/24 0259 04/09/24 9265 04/10/24 9562  04/11/24 0411  WBC 16.5*   < > 15.3* 13.9* 10.8* 10.7* 8.9  NEUTROABS 14.6*  --   --   --   --   --   --   HGB 13.5   < > 11.9* 10.1* 11.1* 12.0 11.9*  HCT 39.6   < > 35.7* 30.3* 33.8* 36.1 36.6  MCV 91.0   < > 92.0 90.7 91.8 92.8 93.8  PLT 332   < > 271 238 242 244 265   < > = values in this interval not displayed.   Basic Metabolic Panel: Recent Labs  Lab 04/07/24 0247 04/08/24 0259 04/09/24 0734 04/10/24 0437 04/11/24 0411  NA 140 136 140 137 138  K 3.4* 2.8* 3.7 3.9 4.0  CL 104 104 105 105 104  CO2 24 23 26 23 24   GLUCOSE 105* 117* 130* 86 93  BUN 12 15 13 14 12   CREATININE 0.72 0.76 0.86 0.86 1.04*  CALCIUM  8.2* 8.1* 8.3* 8.4* 8.8*  MG 1.6*  --   --   --  1.8  PHOS  --   --   --   --  2.0*   Liver Function Tests: Recent Labs  Lab 04/06/24 1430 04/07/24 0247  04/09/24 0734 04/10/24 0437 04/11/24 0411  AST 298* 176* 153* 124* 113*  ALT 390* 236* 185* 153* 138*  ALKPHOS 169* 122 121 103 105  BILITOT 3.0* 2.1* 0.9 0.8 0.9  PROT 7.3 5.7* 5.6* 5.3* 5.7*  ALBUMIN  3.2* 3.1* 2.6* 2.4* 2.6*   CBG: Recent Labs  Lab 04/07/24 0027 04/08/24 2314  GLUCAP 97 136*    Discharge time spent: greater than 30 minutes.  Signed: AIDA CHO, MD [facility] 04/12/2024

## 2024-04-12 NOTE — Progress Notes (Signed)
 Progress Note    Kaitlyn Wright  FMW:[id number] DOB: 04/23/1946  DOA: 04/06/2024 PCP: Arloa Elsie SAUNDERS, MD      Brief Narrative:    Medical records reviewed and are as summarized below:  Kaitlyn Wright is a 78 y.o. female with history of CAD status post CABG in May 2025, postop PAF, elevated LFTs (status post amiodarone  discontinuation end of June), anxiety, depression, hypothyroidism comes into the hospital with acute encephalopathy, falls. Apparently postoperatively she was on midodrine , amiodarone , but on follow-up with cardiology as an outpatient on 6/30 patient seem to be somewhat confused about what she was supposed to take, and for simplification of her home regimen some of the medications were discontinued such as amiodarone , midodrine , torsemide /potassium but she was continued on aspirin , Plavix , atorvastatin , metoprolol . At that time he was also noted that she was having nausea, decreased oral intake. Following that she was seen by cardiothoracic surgery and doing well.  She presented to the hospital with falls, confusion.  She was apparently found on the ground.  It was unclear how long she had been down for.  Apparently, a neighbor found her 3 days prior to admission. She was admitted to the hospital and required ICU due to soft blood pressures and requirements for low-dose vasopressors. She was restarted on midodrine , improved, and transferred to the hospitalist service on 7/22.      Assessment/Plan:   Principal Problem:   Acute encephalopathy Active Problems:   Rhabdomyolysis   Elevated liver enzymes   QT prolongation   CAD (coronary artery disease)   Hypotension    Body mass index is 20.05 kg/m.   Shock, hypotension -etiology not entirely clear, Initially admitted to the ICU and treated with IV vasopressor. Continue midodrine  - Cortisol was checked 7/19, appropriately elevated at 42.1.  TSH unremarkable at 3.7 with normal free T4.     New severe mitral  regurgitation -echocardiogram done 7/19 shows LVEF 65-70%, no WMA, grade 2 diastolic dysfunction.  RV was normal.   Cardiology consulted and evaluated patient 7/21, then signed off.  She appears to be asymptomatic with this, and per cardiology, patient does not want a TEE at this point or further workup.  Outpatient follow-up with [facility] cardiology group in [place].,      CAD status post CABG-no chest pain, this is stable Continue aspirin  and Plavix      Hypokalemia-improved     Elevated LFTs-liver enzymes trending downward.  Probably from shock liver.   Right upper quadrant ultrasound fairly unremarkable, she has a history of cholecystectomy     Weakness, falls-she had hip bruising on admission, x-ray without fractures.  PT and OT recommended discharge to SNF.  However, patient refuses to go to SNF.  She prefers to go home with home health therapy.  She said all she needs is for somebody to come to my house about 2-3 times a week for a few hours .  She said she only tripped and fell in her room, and her daughter has gone to her room to remove anything that will be a trip or fall hazard. She understands that official recommendation was for her to go to SNF and she may be at increased risk for falls at home at this time.     Right arm numbness, weakness - Cervical spondylosis, multilevel foraminal stenosis.  Thoracic right C3-C4, bilaterally at C4-C5, bilaterally at C5-C6 and left C6-C7 Degenerative disc disease of cervical spine, multi-level mild grade 1 anterolisthesis.  Findings  on MRI cervical spine may be contributing to right arm weakness and numbness. I told patient persistent right arm numbness may be due to degenerative disc disease of the cervical spine.  However, Patient said there is nothing wrong with my neck.  My neck is fine. Outpatient follow-up with PCP recommended     Comorbidities include anxiety, depression, hypothyroidism     Discharge order was placed for her to  go home with home health therapy as previously requested.  However, she later changed her mind because she is concerned about her ability to ambulate and appropriately care for herself without much support at home.  Discharge has been canceled.  Follow-up with [facility] team to assist with placement to SNF.  I called [name], daughter, at [phone] at 10:55 AM today but there was no response.    Diet Order             Diet - low sodium heart healthy           Diet regular Room service appropriate? Yes; Fluid consistency: Thin  Diet effective now                            Consultants: Cardiologist Intensivist  Procedures: None    Medications:    aspirin  EC  81 mg Oral Daily   Chlorhexidine  Gluconate Cloth  6 each Topical Daily   clopidogrel   75 mg Oral Daily   feeding supplement  237 mL Oral BID BM   heparin   5,000 Units Subcutaneous Q8H   latanoprost   1 drop Both Eyes QHS   levothyroxine   88 mcg Oral Q0600   midodrine   10 mg Oral TID WC   pantoprazole   40 mg Oral Daily   potassium chloride   40 mEq Oral Once   Continuous Infusions:   Anti-infectives (From admission, onward)    Start     Dose/Rate Route Frequency Ordered Stop   04/07/24 0000  cefTRIAXone  (ROCEPHIN ) 1 g in sodium chloride  0.9 % 100 mL IVPB        1 g 200 mL/hr over 30 Minutes Intravenous  Once 04/06/24 2354 04/07/24 0215              Family Communication/Anticipated D/C date and plan/Code Status   DVT prophylaxis: heparin  injection 5,000 Units Start: 04/07/24 0600 SCDs Start: 04/06/24 2351     Code Status: Full Code  Family Communication: I called [name], daughter, at [phone] at 10:55 AM today but there was no response.  Disposition Plan: Discharge to SNF   Status is: Inpatient Remains inpatient appropriate because: Awaiting placement to SNF       Subjective:   Interval events noted.  She still has some numbness in the right arm and around that she feels  good.  She insisted on going home with home health therapy rather than going to SNF.  Objective:    Vitals:   04/11/24 2024 04/11/24 2300 04/12/24 0352 04/12/24 0702  BP: (!) 138/58 (!) 151/68 132/68 125/69  Pulse:  78 83 75  Resp:  18 18 18   Temp:  97.7 F (36.5 C) 97.8 F (36.6 C) 98.1 F (36.7 C)  TempSrc:  Oral Oral Oral  SpO2:  99% 97% 98%  Weight:   51.3 kg   Height:       No data found.   Intake/Output Summary (Last 24 hours) at 04/12/2024 1144 Last data filed at 04/12/2024 0820 Gross per 24 hour  Intake  580 ml  Output 300 ml  Net 280 ml   Filed Weights   04/11/24 0531 04/11/24 1700 04/12/24 0352  Weight: 48.5 kg 51.8 kg 51.3 kg    Exam:  GEN: NAD SKIN: Warm and dry EYES: No pallor or icterus ENT: MMM CV: RRR PULM: CTA B ABD: soft, ND, NT, +BS CNS: AAO x 3, non focal EXT: No edema or tenderness      Data Reviewed:   I have personally reviewed following labs and imaging studies:  Labs: Labs show the following:   Basic Metabolic Panel: Recent Labs  Lab 04/07/24 0247 04/08/24 0259 04/09/24 0734 04/10/24 0437 04/11/24 0411  NA 140 136 140 137 138  K 3.4* 2.8* 3.7 3.9 4.0  CL 104 104 105 105 104  CO2 24 23 26 23 24   GLUCOSE 105* 117* 130* 86 93  BUN 12 15 13 14 12   CREATININE 0.72 0.76 0.86 0.86 1.04*  CALCIUM  8.2* 8.1* 8.3* 8.4* 8.8*  MG 1.6*  --   --   --  1.8  PHOS  --   --   --   --  2.0*   GFR Estimated Creatinine Clearance: 34.9 mL/min (A) (by C-G formula based on SCr of 1.04 mg/dL (H)). Liver Function Tests: Recent Labs  Lab 04/06/24 1430 04/07/24 0247 04/09/24 0734 04/10/24 0437 04/11/24 0411  AST 298* 176* 153* 124* 113*  ALT 390* 236* 185* 153* 138*  ALKPHOS 169* 122 121 103 105  BILITOT 3.0* 2.1* 0.9 0.8 0.9  PROT 7.3 5.7* 5.6* 5.3* 5.7*  ALBUMIN  3.2* 3.1* 2.6* 2.4* 2.6*   No results for input(s): LIPASE, AMYLASE in the last 168 hours. Recent Labs  Lab 04/07/24 0247  AMMONIA 29   Coagulation  profile Recent Labs  Lab 04/06/24 2317  INR 1.2    CBC: Recent Labs  Lab 04/06/24 1430 04/07/24 0002 04/07/24 0247 04/08/24 0259 04/09/24 0734 04/10/24 0437 04/11/24 0411  WBC 16.5*   < > 15.3* 13.9* 10.8* 10.7* 8.9  NEUTROABS 14.6*  --   --   --   --   --   --   HGB 13.5   < > 11.9* 10.1* 11.1* 12.0 11.9*  HCT 39.6   < > 35.7* 30.3* 33.8* 36.1 36.6  MCV 91.0   < > 92.0 90.7 91.8 92.8 93.8  PLT 332   < > 271 238 242 244 265   < > = values in this interval not displayed.   Cardiac Enzymes: Recent Labs  Lab 04/06/24 1430 04/07/24 0247  CKTOTAL 855* 843*   BNP (last 3 results) No results for input(s): PROBNP in the last 8760 hours. CBG: Recent Labs  Lab 04/07/24 0027 04/08/24 2314  GLUCAP 97 136*   D-Dimer: No results for input(s): DDIMER in the last 72 hours. Hgb A1c: No results for input(s): HGBA1C in the last 72 hours. Lipid Profile: No results for input(s): CHOL, HDL, LDLCALC, TRIG, CHOLHDL, LDLDIRECT in the last 72 hours. Thyroid  function studies: No results for input(s): TSH, T4TOTAL, T3FREE, THYROIDAB in the last 72 hours.  Invalid input(s): FREET3 Anemia work up: No results for input(s): VITAMINB12, FOLATE, FERRITIN, TIBC, IRON, RETICCTPCT in the last 72 hours. Sepsis Labs: Recent Labs  Lab 04/06/24 1442 04/07/24 0005 04/07/24 0247 04/08/24 0259 04/09/24 0734 04/10/24 0437 04/11/24 0411  PROCALCITON  --  0.13  --   --   --   --   --   WBC  --  14.7*   < >  13.9* 10.8* 10.7* 8.9  LATICACIDVEN 1.8 1.3  --   --   --   --   --    < > = values in this interval not displayed.    Microbiology Recent Results (from the past 240 hours)  Blood Culture (routine x 2)     Status: None   Collection Time: 04/06/24  2:35 PM   Specimen: BLOOD  Result Value Ref Range Status   Specimen Description BLOOD RIGHT ANTECUBITAL  Final   Special Requests   Final    BOTTLES DRAWN AEROBIC AND ANAEROBIC Blood Culture results  may not be optimal due to an inadequate volume of blood received in culture bottles   Culture   Final    NO GROWTH 5 DAYS Performed at [facility], [address] [place], [place] [address]    Report Status 04/11/2024 FINAL  Final  Blood Culture (routine x 2)     Status: Abnormal   Collection Time: 04/06/24  3:30 PM   Specimen: BLOOD LEFT ARM  Result Value Ref Range Status   Specimen Description BLOOD LEFT ARM  Final   Special Requests   Final    BOTTLES DRAWN AEROBIC AND ANAEROBIC Blood Culture adequate volume   Culture  Setup Time   Final    GRAM POSITIVE COCCI ANAEROBIC BOTTLE ONLY CRITICAL RESULT CALLED TO, READ BACK BY AND VERIFIED WITH: PHARMD C AMEND 04/07/2024 @ 2249 BY AB    Culture (A)  Final    STAPHYLOCOCCUS EPIDERMIDIS THE SIGNIFICANCE OF ISOLATING THIS ORGANISM FROM A SINGLE SET OF BLOOD CULTURES WHEN MULTIPLE SETS ARE DRAWN IS UNCERTAIN. PLEASE NOTIFY THE MICROBIOLOGY DEPARTMENT WITHIN ONE WEEK IF SPECIATION AND SENSITIVITIES ARE REQUIRED. Performed at [facility], [address] [place], [place] [address]    Report Status 04/09/2024 FINAL  Final  Blood Culture ID Panel (Reflexed)     Status: Abnormal   Collection Time: 04/06/24  3:30 PM  Result Value Ref Range Status   Enterococcus faecalis NOT DETECTED NOT DETECTED Final   Enterococcus Faecium NOT DETECTED NOT DETECTED Final   Listeria monocytogenes NOT DETECTED NOT DETECTED Final   Staphylococcus species DETECTED (A) NOT DETECTED Final    Comment: CRITICAL RESULT CALLED TO, READ BACK BY AND VERIFIED WITH: PHARMD C AMEND 04/07/2024 @ 2249 BY AB    Staphylococcus aureus (BCID) NOT DETECTED NOT DETECTED Final   Staphylococcus epidermidis DETECTED (A) NOT DETECTED Final    Comment: Methicillin (oxacillin) resistant coagulase negative staphylococcus. Possible blood culture contaminant (unless isolated from more than one blood culture draw or clinical case suggests pathogenicity). No antibiotic treatment  is indicated for blood  culture contaminants. CRITICAL RESULT CALLED TO, READ BACK BY AND VERIFIED WITH: PHARMD C AMEND 04/07/2024 @ 2249 BY AB    Staphylococcus lugdunensis NOT DETECTED NOT DETECTED Final   Streptococcus species NOT DETECTED NOT DETECTED Final   Streptococcus agalactiae NOT DETECTED NOT DETECTED Final   Streptococcus pneumoniae NOT DETECTED NOT DETECTED Final   Streptococcus pyogenes NOT DETECTED NOT DETECTED Final   A.calcoaceticus-baumannii NOT DETECTED NOT DETECTED Final   Bacteroides fragilis NOT DETECTED NOT DETECTED Final   Enterobacterales NOT DETECTED NOT DETECTED Final   Enterobacter cloacae complex NOT DETECTED NOT DETECTED Final   Escherichia coli NOT DETECTED NOT DETECTED Final   Klebsiella aerogenes NOT DETECTED NOT DETECTED Final   Klebsiella oxytoca NOT DETECTED NOT DETECTED Final   Klebsiella pneumoniae NOT DETECTED NOT DETECTED Final   Proteus species NOT  DETECTED NOT DETECTED Final   Salmonella species NOT DETECTED NOT DETECTED Final   Serratia marcescens NOT DETECTED NOT DETECTED Final   Haemophilus influenzae NOT DETECTED NOT DETECTED Final   Neisseria meningitidis NOT DETECTED NOT DETECTED Final   Pseudomonas aeruginosa NOT DETECTED NOT DETECTED Final   Stenotrophomonas maltophilia NOT DETECTED NOT DETECTED Final   Candida albicans NOT DETECTED NOT DETECTED Final   Candida auris NOT DETECTED NOT DETECTED Final   Candida glabrata NOT DETECTED NOT DETECTED Final   Candida krusei NOT DETECTED NOT DETECTED Final   Candida parapsilosis NOT DETECTED NOT DETECTED Final   Candida tropicalis NOT DETECTED NOT DETECTED Final   Cryptococcus neoformans/gattii NOT DETECTED NOT DETECTED Final   Methicillin resistance mecA/C DETECTED (A) NOT DETECTED Final    Comment: CRITICAL RESULT CALLED TO, READ BACK BY AND VERIFIED WITH: PHARMD C AMEND 04/07/2024 @ 2249 BY AB Performed at [facility], [address] [place], [place] [address]   MRSA Next Gen by  PCR, Nasal     Status: None   Collection Time: 04/06/24 11:50 PM   Specimen: Nasal Mucosa; Nasal Swab  Result Value Ref Range Status   MRSA by PCR Next Gen NOT DETECTED NOT DETECTED Final    Comment: (NOTE) The GeneXpert MRSA Assay (FDA approved for NASAL specimens only), is one component of a comprehensive MRSA colonization surveillance program. It is not intended to diagnose MRSA infection nor to guide or monitor treatment for MRSA infections. Test performance is not FDA approved in patients less than 66 years old. Performed at [facility], [address] [place], [place] [address]     Procedures and diagnostic studies:  No results found.              LOS: 6 days   Kailani Brass  [facility]   Pager on [email or web address]. If 7PM-7AM, please contact night-coverage at [email or web address]     04/12/2024, 11:44 AM

## 2024-04-12 NOTE — Plan of Care (Signed)
  Problem: Health Behavior/Discharge Planning: Goal: Ability to manage health-related needs will improve Outcome: Progressing   Problem: Activity: Goal: Risk for activity intolerance will decrease Outcome: Progressing   Problem: Nutrition: Goal: Adequate nutrition will be maintained Outcome: Progressing   

## 2024-04-12 NOTE — TOC Progression Note (Addendum)
 Transition of Care [facility]) - Progression Note    Patient Details  Name: Kaitlyn Wright MRN: [id number] Date of Birth: Apr 26, 1946  Transition of Care [facility]) CM/SW Contact  Kaitlyn Wright, [place] Phone Number: 04/12/2024, 8:57 AM  Clinical Narrative:   Shara pending for [facility].   10:40 AM Per daily meeting with treatment team, patient stated she would rather go home and not to SNF. CSW followed up with patient and patient said she did not want to go to SNF at this time. Patient would like HHPT instead. Patient stated her daughter cleaned her bedroom and removed anything that may cause patient to trip/fall. Patient did not have a preference of HH agency and would like RNCM to follow up. CSW informed RNCM.   12:24 PM CSW informed by bedside RN that patient wants to go to SNF. CSW has not cancelled anything for SNF discharge. Auth still pending. CSW clarifying with facility about upfront payment.   1:41 PM CSW confirmed with facility that patient has not met her out of pocket max for the year. [facility] will require $3,036 upfront payment. CSW will review this information with patient and see if she would like to review other bed offers that do not require upfront payment.   4:31 PM CSW called Navi with [employer] to check on auth status. Per Mayme shara is pending for [facility] and has gone up for medical review. Will continue to follow up.   TOC will continue to follow.    Expected Discharge Plan: Skilled Nursing Facility Barriers to Discharge: Continued Medical Work up, English as a second language teacher               Expected Discharge Plan and Services In-house Referral: Clinical Social Work   Post Acute Care Choice: Skilled Nursing Facility Living arrangements for the past 2 months: Single Family Home                                       Social Drivers of Health (SDOH) Interventions SDOH Screenings   Food Insecurity: No Food Insecurity (04/07/2024)  Housing: Low Risk  (04/07/2024)   Transportation Needs: No Transportation Needs (04/07/2024)  Utilities: Not At Risk (04/07/2024)  Social Connections: Moderately Isolated (04/07/2024)  Tobacco Use: Low Risk  (04/06/2024)    Readmission Risk Interventions     No data to display

## 2024-04-12 NOTE — Progress Notes (Signed)
 1137: Patient currently with discharge orders, but reports she is not ready to actually go home yet despite being set up with [facility], as she worries about ability to ambulate and care for herself at home without support. She reports she does not have someone who checks on her daily as daughter lives in [place].  Provider and case management team notified.

## 2024-04-12 NOTE — Progress Notes (Signed)
 1519: Holding afternoon dose of heparin  per MD verbalization due to acute GU bleed. GU bleed is silver dollar sized amount only when patient urinates. Frank red blood presenting mainly on tissue paper when patient wipes and two small clots found in BSC. MD aware of acute GU bleed. Okay to resume evening dose, per MD, as long as no further bleeding.

## 2024-04-13 DIAGNOSIS — G9340 Encephalopathy, unspecified: Secondary | ICD-10-CM | POA: Diagnosis not present

## 2024-04-13 LAB — URINE CULTURE: Culture: <10000 — AB

## 2024-04-13 MED ORDER — K PHOS MONO-SOD PHOS DI & MONO 155-852-130 MG PO TABS
500.0000 mg | ORAL_TABLET | Freq: Three times a day (TID) | ORAL | Status: AC
  Administered 2024-04-13 – 2024-04-14 (×4): 500 mg via ORAL
  Filled 2024-04-13 (×4): qty 2

## 2024-04-13 NOTE — Progress Notes (Signed)
 Pt still showing signs of a mucous , bloody discharge when urinating. Frank color blood in appearance. Not on any antibiotics, UA positive  No signs of hemorrhoids or blood in stool.    Latest Reference Range & Units 04/12/24 01:46  Urinalysis, w/ Reflex to Culture (Infection Suspected)  Rpt !  Appearance CLEAR  CLEAR  Bilirubin Urine NEGATIVE  NEGATIVE  Color, Urine YELLOW  YELLOW  Glucose, UA NEGATIVE mg/dL NEGATIVE  Hgb urine dipstick NEGATIVE  MODERATE !  Ketones, ur NEGATIVE mg/dL NEGATIVE  Leukocytes,Ua NEGATIVE  MODERATE !  Nitrite NEGATIVE  NEGATIVE  pH 5.0 - 8.0  7.0  Protein NEGATIVE mg/dL NEGATIVE  Specific Gravity, Urine 1.005 - 1.030  1.008  Specimen Source  URINE, CLEAN CATCH  Bacteria, UA NONE SEEN  RARE !  Mucus  PRESENT  RBC / HPF 0 - 5 RBC/hpf 21-50  Squamous Epithelial / HPF 0 - 5 /HPF 0-5  WBC, UA 0 - 5 WBC/hpf 21-50  !: Data is abnormal Rpt: View report in Results Review for more information

## 2024-04-13 NOTE — Progress Notes (Signed)
 Physical Therapy Treatment Patient Details Name: Kaitlyn Wright MRN: [id number] DOB: 1946-06-22 Today's Date: 04/13/2024   History of Present Illness 78 y.o. female adm 04/06/24 with found down by neighbor with AMS, hypotensive, shock, new mitral regurgitation. PMHx: CAD s/p CABG 01/27/24, Afib, anxiety, depression, hypothyroidism, HLD.    PT Comments  Pt pleasant stating she is eager for getting stronger and rehab. Reports sacral and Rt hand pain. Pt able to complete long hall ambulation with RW but denied further balance and gait challenges and needs education and supervision for transfers. Pt states she plans on short term rehab and this remains appropriate until additional assist arranged and mobility improved. Will continue to follow. Pt denied OOb to chair.   HR 108 with gait   If plan is discharge home, recommend the following: Assistance with cooking/housework;Direct supervision/assist for medications management;Direct supervision/assist for financial management;Assist for transportation;Help with stairs or ramp for entrance;Supervision due to cognitive status;A little help with walking and/or transfers;A little help with bathing/dressing/bathroom   Can travel by private vehicle     Yes  Equipment Recommendations  Rolling walker (2 wheels)    Recommendations for Other Services       Precautions / Restrictions Precautions Precautions: Fall Recall of Precautions/Restrictions: Intact Precaution/Restrictions Comments: no sternal precautions per secure chat Dr. Lucas 7/23, can gradually increase lifting & UE exercises as tolerated     Mobility  Bed Mobility Overal bed mobility: Modified Independent             General bed mobility comments: pt able to transition supine to sit without assist with HOB 10 degrees, return to bed pt required UB support to lift legs onto surface. Pt refused sitting up in chair end of session    Transfers Overall transfer level: Needs assistance    Transfers: Sit to/from Stand Sit to Stand: Contact guard assist           General transfer comment: cues for hand placement and safety to rise from bed, pivot with RW to BSC, rise from [facility] and perform 5 repeated sit to stands from recliner. Pt required education x 3 on task of repeated sit to stands as unable to recall and poor carryover of hand placement within trial    Ambulation/Gait Ambulation/Gait assistance: Contact guard assist Gait Distance (Feet): 350 Feet Assistive device: Rolling walker (2 wheels) Gait Pattern/deviations: Step-through pattern, Decreased stride length   Gait velocity interpretation: 1.31 - 2.62 ft/sec, indicative of limited community ambulator   General Gait Details: cues for proximity to RW, direction and supervision for safety. attempted to have pt try without RW or challenges like backward and sideways but she denied due to fear of falling. She walked 350' then additional 30' after seated rest   Stairs             Wheelchair Mobility     Tilt Bed    Modified Rankin (Stroke Patients Only)       Balance Overall balance assessment: Needs assistance Sitting-balance support: No upper extremity supported, Feet supported Sitting balance-Leahy Scale: Fair Sitting balance - Comments: Static sitting EOB with supervision for safety   Standing balance support: During functional activity, Bilateral upper extremity supported, Reliant on assistive device for balance Standing balance-Leahy Scale: Poor Standing balance comment: reliant on RW, can static stand at sink to wash hands                            Communication  Communication Communication: Impaired Factors Affecting Communication: Hearing impaired  Cognition Arousal: Alert Behavior During Therapy: WFL for tasks assessed/performed   PT - Cognitive impairments: Safety/Judgement, Problem solving, Awareness, Sequencing, Memory, Attention                       PT -  Cognition Comments: Pt tangential at times and needs redirecting, inconsistent as she requested chair follow due to feeling she might fatigue and need to sit but then stating she still works at [employer] after 32 yrs and has strong legs. Pt limited by her fear of falling and denied attempting further balance challenges to improve balance Following commands: Impaired Following commands impaired: Follows one step commands with increased time    Cueing Cueing Techniques: Verbal cues  Exercises      General Comments        Pertinent Vitals/Pain Pain Assessment Pain Assessment: 0-10 Pain Score: 2  Pain Location: buttocks, peri area, and right hand Pain Descriptors / Indicators: Sore, Discomfort, Aching Pain Intervention(s): Limited activity within patient's tolerance, Monitored during session, Repositioned    Home Living                          Prior Function            PT Goals (current goals can now be found in the care plan section) Progress towards PT goals: Progressing toward goals    Frequency    Min 2X/week      PT Plan      Co-evaluation              AM-PAC PT 6 Clicks Mobility   Outcome Measure  Help needed turning from your back to your side while in a flat bed without using bedrails?: None Help needed moving from lying on your back to sitting on the side of a flat bed without using bedrails?: None Help needed moving to and from a bed to a chair (including a wheelchair)?: A Little Help needed standing up from a chair using your arms (e.g., wheelchair or bedside chair)?: A Little Help needed to walk in hospital room?: A Little Help needed climbing 3-5 steps with a railing? : A Lot 6 Click Score: 19    End of Session Equipment Utilized During Treatment: Gait belt Activity Tolerance: Patient tolerated treatment well Patient left: in bed;with call bell/phone within reach;with bed alarm set Nurse Communication: Mobility status PT Visit  Diagnosis: Other abnormalities of gait and mobility (R26.89);Muscle weakness (generalized) (M62.81);History of falling (Z91.81);Difficulty in walking, not elsewhere classified (R26.2);Pain     Time: 9095-9065 PT Time Calculation (min) (ACUTE ONLY): 30 min  Charges:    $Gait Training: 8-22 mins $Therapeutic Activity: 8-22 mins PT General Charges $$ ACUTE PT VISIT: 1 Visit                     Lenoard SQUIBB, PT Acute Rehabilitation Services Office: [phone]    Lenoard NOVAK Tong[place] Prout 04/13/2024, 10:15 AM

## 2024-04-13 NOTE — Progress Notes (Signed)
 Mobility Specialist Progress Note:   04/13/24 1400  Mobility  Activity Ambulated with assistance in hallway  Level of Assistance Contact guard assist, steadying assist  Assistive Device Front wheel walker  Distance Ambulated (ft) 200 ft  Activity Response Tolerated well  Mobility Referral Yes  Mobility visit 1 Mobility  Mobility Specialist Start Time (ACUTE ONLY) 1400  Mobility Specialist Stop Time (ACUTE ONLY) 1416  Mobility Specialist Time Calculation (min) (ACUTE ONLY) 16 min   Pt agreeable to mobility session. Required CGA for safety. No overt LOB noted. Pt back in bed with all needs met.   Therisa Rana Mobility Specialist Please contact via SecureChat or  Rehab office at [phone]

## 2024-04-13 NOTE — Plan of Care (Signed)
   Problem: Education: Goal: Knowledge of General Education information will improve Description: Including pain rating scale, medication(s)/side effects and non-pharmacologic comfort measures Outcome: Progressing   Problem: Health Behavior/Discharge Planning: Goal: Ability to manage health-related needs will improve Outcome: Progressing   Problem: Activity: Goal: Risk for activity intolerance will decrease Outcome: Progressing

## 2024-04-13 NOTE — Progress Notes (Signed)
 1144: Orders clarified with MD. Lab called and orders clarified.

## 2024-04-13 NOTE — TOC Progression Note (Addendum)
 Transition of Care [facility]) - Progression Note    Patient Details  Name: Kaitlyn Wright MRN: [id number] Date of Birth: [birth year]  [facility]) CM/SW Contact  Luise JAYSON Pan, [place] Phone Number: 04/13/2024, 10:06 AM  Clinical Narrative:   Shara still pending for [facility].   11:50 AM CSW met with patient to explain process for SNF. Patient asked about why she was told things were still in place with her to go to [facility]. CSW explained that what was stated is that insurance authorization was not cancelled when patient discussed going home. Insurance authorization was still pending for [facility] at the time. CSW asked patients permission to call her daughter so that family is also informed. Patient gave CSW permission to call [name]. CSW informed patient that facilities do not always hold beds for patients. CSW inquired with patient about what she wants to do in the event she cannot go to [facility]; patient stated she would have to go home at that point and she has neighbors that could help assist her as she would not want to go to any other facility. Patient stated she would not want to go to [facility] or [facility].   CSW called Navi with [employer] to discuss the status of patients authorization. Shara has been approved for [facility]; approval dates 04/12/2024 - 04/16/2024. Auth id [id number].   CSW reached out to Springer with [facility] to inquire about bed availability.  CSW called [name] and updated her on current status of discharge plan. CSW explained insurance auth process and bed availability process at facility. [name] stated she understands and would like a call about what the finalized discharge plan is.   Per [place], facility does not have bed availability today. CSW inquired about when a bed will become available, awaiting answer. Per [place], there may potentially be bed availability Monday, CSW will need to follow up.   1:15PM CSW spoke with patient at bedside and explained that insurance auth was  approved for 7/24 - 7/28 (these dates are for the hospital to get patient over to the facility); however [facility] does not have a bed. CSW explained that facility informed CSW that they may have bed availability Monday. Patient stated she is not upset with CSW and is upset with insurance for taking so long. Patient would like to see if [name] has bed availability Monday. If not, will need to discuss plan for discharging home with [facility] as patient does not want to go to any other facility.   TOC will continue to follow.    Expected Discharge Plan: Skilled Nursing Facility Barriers to Discharge: Continued Medical Work up, English as a second language teacher               Expected Discharge Plan and Services In-house Referral: Clinical Social Work   Post Acute Care Choice: Skilled Nursing Facility Living arrangements for the past 2 months: Single Family Home Expected Discharge Date: 04/12/24                                     Social Drivers of Health (SDOH) Interventions SDOH Screenings   Food Insecurity: No Food Insecurity (04/07/2024)  Housing: Low Risk  (04/07/2024)  Transportation Needs: No Transportation Needs (04/07/2024)  Utilities: Not At Risk (04/07/2024)  Social Connections: Moderately Isolated (04/07/2024)  Tobacco Use: Low Risk  (04/06/2024)    Readmission Risk Interventions     No data to display

## 2024-04-13 NOTE — Progress Notes (Signed)
 Progress Note    Kaitlyn Wright  FMW:[id number] DOB: [birth year]  DOA: 04/06/2024 PCP: Arloa Elsie SAUNDERS, MD      Brief Narrative:  Patient is an 78 year old female past medical history significant for CAD status post CABG in May 2025, complicated by postop PAF; elevated LFTs leading to discontinuation of amiodarone  towards the end of June and hypothyroidism.  Patient was admitted with acute encephalopathy and fall.  Hospital course has been complicated by intermittent hypotension requiring vasopressors.  Patient was also noted to have severe MR, but patient does not want any further cardiac workup (as per prior documentation).  Patient is awaiting disposition to skilled nursing facility.  Insurance approval is being awaited.  04/13/2024: Patient seen.  No new complaints.  Low phosphorus noted (2).  Will replace phosphorus.  Pursue disposition.   Assessment/Plan:   Principal Problem:   Acute encephalopathy Active Problems:   Rhabdomyolysis   Elevated liver enzymes   QT prolongation   CAD (coronary artery disease)   Hypotension    Body mass index is 19.84 kg/m.   Hypertension/shock:  -Etiology not entirely clear, -Initially admitted to the ICU and treated with IV vasopressor. -Continue midodrine  - Cortisol was checked 7/19, appropriately elevated at 42.1.  TSH unremarkable at 3.7 with normal free T4.     New severe mitral regurgitation: -Echocardiogram done 7/19 shows LVEF 65-70%, no WMA, grade 2 diastolic dysfunction.  RV was normal.   -Cardiology consulted and evaluated patient 7/21, then signed off.  She appears to be asymptomatic with this, and per cardiology, patient does not want a TEE at this point or further workup.   -Outpatient follow-up with [facility] cardiology group in [place].,     CAD status post CABG-no chest pain, this is stable Continue aspirin  and Plavix    Hypokalemia: -Continue to monitor and replete.     Elevated LFTs: -Liver enzymes trending  downward.  Probably from shock liver.   -Right upper quadrant ultrasound fairly unremarkable, she has a history of cholecystectomy   Weakness/falls: -Pursue SNF.    Right arm numbness, weakness - Cervical spondylosis, multilevel foraminal stenosis.  Thoracic right C3-C4, bilaterally at C4-C5, bilaterally at C5-C6 and left C6-C7 Degenerative disc disease of cervical spine, multi-level mild grade 1 anterolisthesis.      Diet Order             Diet - low sodium heart healthy           Diet regular Room service appropriate? Yes; Fluid consistency: Thin  Diet effective now                   Consultants: Cardiology ICU team  Procedures: None    Medications:    aspirin  EC  81 mg Oral Daily   Chlorhexidine  Gluconate Cloth  6 each Topical Daily   clopidogrel   75 mg Oral Daily   feeding supplement  237 mL Oral BID BM   heparin   5,000 Units Subcutaneous Q8H   latanoprost   1 drop Both Eyes QHS   levothyroxine   88 mcg Oral Q0600   midodrine   10 mg Oral TID WC   pantoprazole   40 mg Oral Daily   potassium chloride   40 mEq Oral Once   Continuous Infusions:   Anti-infectives (From admission, onward)    Start     Dose/Rate Route Frequency Ordered Stop   04/07/24 0000  cefTRIAXone  (ROCEPHIN ) 1 g in sodium chloride  0.9 % 100 mL IVPB  1 g 200 mL/hr over 30 Minutes Intravenous  Once 04/06/24 2354 04/07/24 0215              Family Communication/Anticipated D/C date and plan/Code Status   DVT prophylaxis: heparin  injection 5,000 Units Start: 04/07/24 0600 SCDs Start: 04/06/24 2351     Code Status: Full Code  Family Communication: I called [name], daughter, at [phone] at 10:55 AM today but there was no response.  Disposition Plan: Discharge to SNF   Status is: Inpatient Remains inpatient appropriate because: Awaiting placement to SNF       Subjective:   Interval events noted.  She still has some numbness in the right arm and around that she  feels good.  She insisted on going home with home health therapy rather than going to SNF.  Objective:    Vitals:   04/12/24 2328 04/13/24 0326 04/13/24 0804 04/13/24 1007  BP: 139/62 128/64 133/68   Pulse: 70 72 99 (!) 108  Resp: 19 19 (!) 22   Temp: 97.9 F (36.6 C) 97.6 F (36.4 C) 98.2 F (36.8 C)   TempSrc: Oral Oral Oral   SpO2: 96% 95% 98%   Weight:  50.8 kg    Height:       No data found.   Intake/Output Summary (Last 24 hours) at 04/13/2024 1142 Last data filed at 04/13/2024 1047 Gross per 24 hour  Intake 480 ml  Output 550 ml  Net -70 ml   Filed Weights   04/11/24 1700 04/12/24 0352 04/13/24 0326  Weight: 51.8 kg 51.3 kg 50.8 kg    Exam:  GEN: Awake and alert.  Not in any distress. HEENT: Mild pallor.  No jaundice. Neck: Supple.  No raised JVD. Lungs: Clear to auscultation. CVS: S1-S2. Abdomen: Soft and nontender. Extremities: No leg edema. Neuro: Awake and alert.  Moves all extremities.    Data Reviewed:   I have personally reviewed following labs and imaging studies:  Labs: Labs show the following:   Basic Metabolic Panel: Recent Labs  Lab 04/07/24 0247 04/08/24 0259 04/09/24 0734 04/10/24 0437 04/11/24 0411  NA 140 136 140 137 138  K 3.4* 2.8* 3.7 3.9 4.0  CL 104 104 105 105 104  CO2 24 23 26 23 24   GLUCOSE 105* 117* 130* 86 93  BUN 12 15 13 14 12   CREATININE 0.72 0.76 0.86 0.86 1.04*  CALCIUM  8.2* 8.1* 8.3* 8.4* 8.8*  MG 1.6*  --   --   --  1.8  PHOS  --   --   --   --  2.0*   GFR Estimated Creatinine Clearance: 34.6 mL/min (A) (by C-G formula based on SCr of 1.04 mg/dL (H)). Liver Function Tests: Recent Labs  Lab 04/06/24 1430 04/07/24 0247 04/09/24 0734 04/10/24 0437 04/11/24 0411  AST 298* 176* 153* 124* 113*  ALT 390* 236* 185* 153* 138*  ALKPHOS 169* 122 121 103 105  BILITOT 3.0* 2.1* 0.9 0.8 0.9  PROT 7.3 5.7* 5.6* 5.3* 5.7*  ALBUMIN  3.2* 3.1* 2.6* 2.4* 2.6*   No results for input(s): LIPASE, AMYLASE in  the last 168 hours. Recent Labs  Lab 04/07/24 0247  AMMONIA 29   Coagulation profile Recent Labs  Lab 04/06/24 2317  INR 1.2    CBC: Recent Labs  Lab 04/06/24 1430 04/07/24 0002 04/07/24 0247 04/08/24 0259 04/09/24 0734 04/10/24 0437 04/11/24 0411  WBC 16.5*   < > 15.3* 13.9* 10.8* 10.7* 8.9  NEUTROABS 14.6*  --   --   --   --   --   --  HGB 13.5   < > 11.9* 10.1* 11.1* 12.0 11.9*  HCT 39.6   < > 35.7* 30.3* 33.8* 36.1 36.6  MCV 91.0   < > 92.0 90.7 91.8 92.8 93.8  PLT 332   < > 271 238 242 244 265   < > = values in this interval not displayed.   Cardiac Enzymes: Recent Labs  Lab 04/06/24 1430 04/07/24 0247  CKTOTAL 855* 843*   BNP (last 3 results) No results for input(s): PROBNP in the last 8760 hours. CBG: Recent Labs  Lab 04/07/24 0027 04/08/24 2314  GLUCAP 97 136*   D-Dimer: No results for input(s): DDIMER in the last 72 hours. Hgb A1c: No results for input(s): HGBA1C in the last 72 hours. Lipid Profile: No results for input(s): CHOL, HDL, LDLCALC, TRIG, CHOLHDL, LDLDIRECT in the last 72 hours. Thyroid  function studies: No results for input(s): TSH, T4TOTAL, T3FREE, THYROIDAB in the last 72 hours.  Invalid input(s): FREET3 Anemia work up: No results for input(s): VITAMINB12, FOLATE, FERRITIN, TIBC, IRON, RETICCTPCT in the last 72 hours. Sepsis Labs: Recent Labs  Lab 04/06/24 1442 04/07/24 0005 04/07/24 0247 04/08/24 0259 04/09/24 0734 04/10/24 0437 04/11/24 0411  PROCALCITON  --  0.13  --   --   --   --   --   WBC  --  14.7*   < > 13.9* 10.8* 10.7* 8.9  LATICACIDVEN 1.8 1.3  --   --   --   --   --    < > = values in this interval not displayed.    Microbiology Recent Results (from the past 240 hours)  Blood Culture (routine x 2)     Status: None   Collection Time: 04/06/24  2:35 PM   Specimen: BLOOD  Result Value Ref Range Status   Specimen Description BLOOD RIGHT ANTECUBITAL  Final    Special Requests   Final    BOTTLES DRAWN AEROBIC AND ANAEROBIC Blood Culture results may not be optimal due to an inadequate volume of blood received in culture bottles   Culture   Final    NO GROWTH 5 DAYS Performed at [facility], [address], [place], [place] [address]    Report Status 04/11/2024 FINAL  Final  Blood Culture (routine x 2)     Status: Abnormal   Collection Time: 04/06/24  3:30 PM   Specimen: BLOOD LEFT ARM  Result Value Ref Range Status   Specimen Description BLOOD LEFT ARM  Final   Special Requests   Final    BOTTLES DRAWN AEROBIC AND ANAEROBIC Blood Culture adequate volume   Culture  Setup Time   Final    GRAM POSITIVE COCCI ANAEROBIC BOTTLE ONLY CRITICAL RESULT CALLED TO, READ BACK BY AND VERIFIED WITH: PHARMD C AMEND 04/07/2024 @ 2249 BY AB    Culture (A)  Final    STAPHYLOCOCCUS EPIDERMIDIS THE SIGNIFICANCE OF ISOLATING THIS ORGANISM FROM A SINGLE SET OF BLOOD CULTURES WHEN MULTIPLE SETS ARE DRAWN IS UNCERTAIN. PLEASE NOTIFY THE MICROBIOLOGY DEPARTMENT WITHIN ONE WEEK IF SPECIATION AND SENSITIVITIES ARE REQUIRED. Performed at [facility], [address], [place], [place] [address]    Report Status 04/09/2024 FINAL  Final  Blood Culture ID Panel (Reflexed)     Status: Abnormal   Collection Time: 04/06/24  3:30 PM  Result Value Ref Range Status   Enterococcus faecalis NOT DETECTED NOT DETECTED Final   Enterococcus Faecium NOT DETECTED NOT DETECTED Final   Listeria monocytogenes NOT  DETECTED NOT DETECTED Final   Staphylococcus species DETECTED (A) NOT DETECTED Final    Comment: CRITICAL RESULT CALLED TO, READ BACK BY AND VERIFIED WITH: PHARMD C AMEND 04/07/2024 @ 2249 BY AB    Staphylococcus aureus (BCID) NOT DETECTED NOT DETECTED Final   Staphylococcus epidermidis DETECTED (A) NOT DETECTED Final    Comment: Methicillin (oxacillin) resistant coagulase negative staphylococcus. Possible blood culture contaminant (unless isolated from more than  one blood culture draw or clinical case suggests pathogenicity). No antibiotic treatment is indicated for blood  culture contaminants. CRITICAL RESULT CALLED TO, READ BACK BY AND VERIFIED WITH: PHARMD C AMEND 04/07/2024 @ 2249 BY AB    Staphylococcus lugdunensis NOT DETECTED NOT DETECTED Final   Streptococcus species NOT DETECTED NOT DETECTED Final   Streptococcus agalactiae NOT DETECTED NOT DETECTED Final   Streptococcus pneumoniae NOT DETECTED NOT DETECTED Final   Streptococcus pyogenes NOT DETECTED NOT DETECTED Final   A.calcoaceticus-baumannii NOT DETECTED NOT DETECTED Final   Bacteroides fragilis NOT DETECTED NOT DETECTED Final   Enterobacterales NOT DETECTED NOT DETECTED Final   Enterobacter cloacae complex NOT DETECTED NOT DETECTED Final   Escherichia coli NOT DETECTED NOT DETECTED Final   Klebsiella aerogenes NOT DETECTED NOT DETECTED Final   Klebsiella oxytoca NOT DETECTED NOT DETECTED Final   Klebsiella pneumoniae NOT DETECTED NOT DETECTED Final   Proteus species NOT DETECTED NOT DETECTED Final   Salmonella species NOT DETECTED NOT DETECTED Final   Serratia marcescens NOT DETECTED NOT DETECTED Final   Haemophilus influenzae NOT DETECTED NOT DETECTED Final   Neisseria meningitidis NOT DETECTED NOT DETECTED Final   Pseudomonas aeruginosa NOT DETECTED NOT DETECTED Final   Stenotrophomonas maltophilia NOT DETECTED NOT DETECTED Final   Candida albicans NOT DETECTED NOT DETECTED Final   Candida auris NOT DETECTED NOT DETECTED Final   Candida glabrata NOT DETECTED NOT DETECTED Final   Candida krusei NOT DETECTED NOT DETECTED Final   Candida parapsilosis NOT DETECTED NOT DETECTED Final   Candida tropicalis NOT DETECTED NOT DETECTED Final   Cryptococcus neoformans/gattii NOT DETECTED NOT DETECTED Final   Methicillin resistance mecA/C DETECTED (A) NOT DETECTED Final    Comment: CRITICAL RESULT CALLED TO, READ BACK BY AND VERIFIED WITH: PHARMD C AMEND 04/07/2024 @ 2249 BY AB Performed  at [facility], [address] [place], [place] [address]   MRSA Next Gen by PCR, Nasal     Status: None   Collection Time: 04/06/24 11:50 PM   Specimen: Nasal Mucosa; Nasal Swab  Result Value Ref Range Status   MRSA by PCR Next Gen NOT DETECTED NOT DETECTED Final    Comment: (NOTE) The GeneXpert MRSA Assay (FDA approved for NASAL specimens only), is one component of a comprehensive MRSA colonization surveillance program. It is not intended to diagnose MRSA infection nor to guide or monitor treatment for MRSA infections. Test performance is not FDA approved in patients less than 85 years old. Performed at [facility], [address] [place], [place] [address]   Urine Culture     Status: Abnormal   Collection Time: 04/12/24  1:46 AM   Specimen: Urine, Random  Result Value Ref Range Status   Specimen Description URINE, RANDOM  Final   Special Requests NONE Reflexed from [id number]  Final   Culture (A)  Final    <10,000 COLONIES/mL INSIGNIFICANT GROWTH Performed at [facility], [address] [place], [place] [address]    Report Status 04/13/2024 FINAL  Final  Procedures and diagnostic studies:  No results found.    LOS: 7 days   Kaitlyn Wright  [facility]   Pager on [email or web address]. If 7PM-7AM, please contact night-coverage at [email or web address]     04/13/2024, 11:42 AM

## 2024-04-14 DIAGNOSIS — G9340 Encephalopathy, unspecified: Secondary | ICD-10-CM | POA: Diagnosis not present

## 2024-04-14 NOTE — Progress Notes (Signed)
 Progress Note    Kaitlyn Wright  FMW:[id number] DOB: 02/09/1946  DOA: 04/06/2024 PCP: Arloa Elsie SAUNDERS, MD      Brief Narrative:  Patient is an 78 year old female past medical history significant for CAD status post CABG in May 2025, complicated by postop PAF; elevated LFTs leading to discontinuation of amiodarone  towards the end of June and hypothyroidism.  Patient was admitted with acute encephalopathy and fall.  Hospital course has been complicated by intermittent hypotension requiring vasopressors.  Patient was also noted to have severe MR, but patient does not want any further cardiac workup (as per prior documentation).  Patient is awaiting disposition to skilled nursing facility.  Insurance approval is being awaited.  04/14/2024: Patient seen.  No new complaints.  Will recheck phosphorus level in the morning.  Pursue disposition.   Assessment/Plan:   Principal Problem:   Acute encephalopathy Active Problems:   Rhabdomyolysis   Elevated liver enzymes   QT prolongation   CAD (coronary artery disease)   Hypotension    Body mass index is 19.6 kg/m.   Hypertension/shock:  -Etiology not entirely clear, -Initially admitted to the ICU and treated with IV vasopressor. -Continue midodrine  - Cortisol was checked 7/19, appropriately elevated at 42.1.  TSH unremarkable at 3.7 with normal free T4.     New severe mitral regurgitation: -Echocardiogram done 7/19 shows LVEF 65-70%, no WMA, grade 2 diastolic dysfunction.  RV was normal.   -Cardiology consulted and evaluated patient 7/21, then signed off.  She appears to be asymptomatic with this, and per cardiology, patient does not want a TEE at this point or further workup.   -Outpatient follow-up with [facility] cardiology group [place]     CAD status post CABG-no chest pain, this is stable Continue aspirin  and Plavix    Hypokalemia: -Continue to monitor and replete.     Elevated LFTs: -Liver enzymes trending downward.   Probably from shock liver.   -Right upper quadrant ultrasound fairly unremarkable, she has a history of cholecystectomy   Weakness/falls: -Pursue SNF.    Right arm numbness, weakness - Cervical spondylosis, multilevel foraminal stenosis.  Thoracic right C3-C4, bilaterally at C4-C5, bilaterally at C5-C6 and left C6-C7 Degenerative disc disease of cervical spine, multi-level mild grade 1 anterolisthesis.      Diet Order             Diet - low sodium heart healthy           Diet regular Room service appropriate? Yes; Fluid consistency: Thin  Diet effective now                   Consultants: Cardiology ICU team  Procedures: None    Medications:    aspirin  EC  81 mg Oral Daily   Chlorhexidine  Gluconate Cloth  6 each Topical Daily   clopidogrel   75 mg Oral Daily   feeding supplement  237 mL Oral BID BM   heparin   5,000 Units Subcutaneous Q8H   latanoprost   1 drop Both Eyes QHS   levothyroxine   88 mcg Oral Q0600   midodrine   10 mg Oral TID WC   pantoprazole   40 mg Oral Daily   potassium chloride   40 mEq Oral Once   Continuous Infusions:   Anti-infectives (From admission, onward)    Start     Dose/Rate Route Frequency Ordered Stop   04/07/24 0000  cefTRIAXone  (ROCEPHIN ) 1 g in sodium chloride  0.9 % 100 mL IVPB  1 g 200 mL/hr over 30 Minutes Intravenous  Once 04/06/24 2354 04/07/24 0215              Family Communication/Anticipated D/C date and plan/Code Status   DVT prophylaxis: heparin  injection 5,000 Units Start: 04/07/24 0600 SCDs Start: 04/06/24 2351     Code Status: Full Code  Family Communication: I called [name], daughter, at [phone] at 10:55 AM today but there was no response.  Disposition Plan: Discharge to SNF   Status is: Inpatient Remains inpatient appropriate because: Awaiting placement to SNF       Subjective:   No new complaints.  Objective:    Vitals:   04/14/24 0109 04/14/24 0500 04/14/24 0746 04/14/24  1150  BP: (!) 93/54  (!) 124/59   Pulse: 72  73   Resp: 19  20   Temp: 97.7 F (36.5 C)  97.9 F (36.6 C)   TempSrc: Oral  Oral Oral  SpO2: 94%  95%   Weight:  50.2 kg    Height:       No data found.   Intake/Output Summary (Last 24 hours) at 04/14/2024 1629 Last data filed at 04/14/2024 1500 Gross per 24 hour  Intake 480 ml  Output 775 ml  Net -295 ml   Filed Weights   04/12/24 0352 04/13/24 0326 04/14/24 0500  Weight: 51.3 kg 50.8 kg 50.2 kg    Exam:  GEN: Awake and alert.  Not in any distress. HEENT: Mild pallor.  No jaundice. Neck: Supple.  No raised JVD. Lungs: Clear to auscultation. CVS: S1-S2. Abdomen: Soft and nontender. Extremities: No leg edema. Neuro: Awake and alert.  Moves all extremities.    Data Reviewed:   I have personally reviewed following labs and imaging studies:  Labs: Labs show the following:   Basic Metabolic Panel: Recent Labs  Lab 04/08/24 0259 04/09/24 0734 04/10/24 0437 04/11/24 0411  NA 136 140 137 138  K 2.8* 3.7 3.9 4.0  CL 104 105 105 104  CO2 23 26 23 24   GLUCOSE 117* 130* 86 93  BUN 15 13 14 12   CREATININE 0.76 0.86 0.86 1.04*  CALCIUM  8.1* 8.3* 8.4* 8.8*  MG  --   --   --  1.8  PHOS  --   --   --  2.0*   GFR Estimated Creatinine Clearance: 34.2 mL/min (A) (by C-G formula based on SCr of 1.04 mg/dL (H)). Liver Function Tests: Recent Labs  Lab 04/09/24 0734 04/10/24 0437 04/11/24 0411  AST 153* 124* 113*  ALT 185* 153* 138*  ALKPHOS 121 103 105  BILITOT 0.9 0.8 0.9  PROT 5.6* 5.3* 5.7*  ALBUMIN  2.6* 2.4* 2.6*   No results for input(s): LIPASE, AMYLASE in the last 168 hours. No results for input(s): AMMONIA in the last 168 hours.  Coagulation profile No results for input(s): INR, PROTIME in the last 168 hours.   CBC: Recent Labs  Lab 04/08/24 0259 04/09/24 0734 04/10/24 0437 04/11/24 0411  WBC 13.9* 10.8* 10.7* 8.9  HGB 10.1* 11.1* 12.0 11.9*  HCT 30.3* 33.8* 36.1 36.6  MCV 90.7  91.8 92.8 93.8  PLT 238 242 244 265   Cardiac Enzymes: No results for input(s): CKTOTAL, CKMB, CKMBINDEX, TROPONINI in the last 168 hours.  BNP (last 3 results) No results for input(s): PROBNP in the last 8760 hours. CBG: Recent Labs  Lab 04/08/24 2314  GLUCAP 136*   D-Dimer: No results for input(s): DDIMER in the last 72 hours. Hgb A1c: No results  for input(s): HGBA1C in the last 72 hours. Lipid Profile: No results for input(s): CHOL, HDL, LDLCALC, TRIG, CHOLHDL, LDLDIRECT in the last 72 hours. Thyroid  function studies: No results for input(s): TSH, T4TOTAL, T3FREE, THYROIDAB in the last 72 hours.  Invalid input(s): FREET3 Anemia work up: No results for input(s): VITAMINB12, FOLATE, FERRITIN, TIBC, IRON, RETICCTPCT in the last 72 hours. Sepsis Labs: Recent Labs  Lab 04/08/24 0259 04/09/24 0734 04/10/24 0437 04/11/24 0411  WBC 13.9* 10.8* 10.7* 8.9    Microbiology Recent Results (from the past 240 hours)  Blood Culture (routine x 2)     Status: None   Collection Time: 04/06/24  2:35 PM   Specimen: BLOOD  Result Value Ref Range Status   Specimen Description BLOOD RIGHT ANTECUBITAL  Final   Special Requests   Final    BOTTLES DRAWN AEROBIC AND ANAEROBIC Blood Culture results may not be optimal due to an inadequate volume of blood received in culture bottles   Culture   Final    NO GROWTH 5 DAYS Performed at [facility], [address], [place], [place] [address]    Report Status 04/11/2024 FINAL  Final  Blood Culture (routine x 2)     Status: Abnormal   Collection Time: 04/06/24  3:30 PM   Specimen: BLOOD LEFT ARM  Result Value Ref Range Status   Specimen Description BLOOD LEFT ARM  Final   Special Requests   Final    BOTTLES DRAWN AEROBIC AND ANAEROBIC Blood Culture adequate volume   Culture  Setup Time   Final    GRAM POSITIVE COCCI ANAEROBIC BOTTLE ONLY CRITICAL RESULT CALLED TO, READ BACK BY AND  VERIFIED WITH: PHARMD C AMEND 04/07/2024 @ 2249 BY AB    Culture (A)  Final    STAPHYLOCOCCUS EPIDERMIDIS THE SIGNIFICANCE OF ISOLATING THIS ORGANISM FROM A SINGLE SET OF BLOOD CULTURES WHEN MULTIPLE SETS ARE DRAWN IS UNCERTAIN. PLEASE NOTIFY THE MICROBIOLOGY DEPARTMENT WITHIN ONE WEEK IF SPECIATION AND SENSITIVITIES ARE REQUIRED. Performed at [facility], [address], [place], [place] [address]    Report Status 04/09/2024 FINAL  Final  Blood Culture ID Panel (Reflexed)     Status: Abnormal   Collection Time: 04/06/24  3:30 PM  Result Value Ref Range Status   Enterococcus faecalis NOT DETECTED NOT DETECTED Final   Enterococcus Faecium NOT DETECTED NOT DETECTED Final   Listeria monocytogenes NOT DETECTED NOT DETECTED Final   Staphylococcus species DETECTED (A) NOT DETECTED Final    Comment: CRITICAL RESULT CALLED TO, READ BACK BY AND VERIFIED WITH: PHARMD C AMEND 04/07/2024 @ 2249 BY AB    Staphylococcus aureus (BCID) NOT DETECTED NOT DETECTED Final   Staphylococcus epidermidis DETECTED (A) NOT DETECTED Final    Comment: Methicillin (oxacillin) resistant coagulase negative staphylococcus. Possible blood culture contaminant (unless isolated from more than one blood culture draw or clinical case suggests pathogenicity). No antibiotic treatment is indicated for blood  culture contaminants. CRITICAL RESULT CALLED TO, READ BACK BY AND VERIFIED WITH: PHARMD C AMEND 04/07/2024 @ 2249 BY AB    Staphylococcus lugdunensis NOT DETECTED NOT DETECTED Final   Streptococcus species NOT DETECTED NOT DETECTED Final   Streptococcus agalactiae NOT DETECTED NOT DETECTED Final   Streptococcus pneumoniae NOT DETECTED NOT DETECTED Final   Streptococcus pyogenes NOT DETECTED NOT DETECTED Final   A.calcoaceticus-baumannii NOT DETECTED NOT DETECTED Final   Bacteroides fragilis NOT DETECTED NOT DETECTED Final   Enterobacterales NOT DETECTED NOT DETECTED Final   Enterobacter  cloacae complex NOT DETECTED  NOT DETECTED Final   Escherichia coli NOT DETECTED NOT DETECTED Final   Klebsiella aerogenes NOT DETECTED NOT DETECTED Final   Klebsiella oxytoca NOT DETECTED NOT DETECTED Final   Klebsiella pneumoniae NOT DETECTED NOT DETECTED Final   Proteus species NOT DETECTED NOT DETECTED Final   Salmonella species NOT DETECTED NOT DETECTED Final   Serratia marcescens NOT DETECTED NOT DETECTED Final   Haemophilus influenzae NOT DETECTED NOT DETECTED Final   Neisseria meningitidis NOT DETECTED NOT DETECTED Final   Pseudomonas aeruginosa NOT DETECTED NOT DETECTED Final   Stenotrophomonas maltophilia NOT DETECTED NOT DETECTED Final   Candida albicans NOT DETECTED NOT DETECTED Final   Candida auris NOT DETECTED NOT DETECTED Final   Candida glabrata NOT DETECTED NOT DETECTED Final   Candida krusei NOT DETECTED NOT DETECTED Final   Candida parapsilosis NOT DETECTED NOT DETECTED Final   Candida tropicalis NOT DETECTED NOT DETECTED Final   Cryptococcus neoformans/gattii NOT DETECTED NOT DETECTED Final   Methicillin resistance mecA/C DETECTED (A) NOT DETECTED Final    Comment: CRITICAL RESULT CALLED TO, READ BACK BY AND VERIFIED WITH: PHARMD C AMEND 04/07/2024 @ 2249 BY AB Performed at [facility], [address], [place], [place] [address]   MRSA Next Gen by PCR, Nasal     Status: None   Collection Time: 04/06/24 11:50 PM   Specimen: Nasal Mucosa; Nasal Swab  Result Value Ref Range Status   MRSA by PCR Next Gen NOT DETECTED NOT DETECTED Final    Comment: (NOTE) The GeneXpert MRSA Assay (FDA approved for NASAL specimens only), is one component of a comprehensive MRSA colonization surveillance program. It is not intended to diagnose MRSA infection nor to guide or monitor treatment for MRSA infections. Test performance is not FDA approved in patients less than 38 years old. Performed at [facility], [address], [place], [place] [address]   Urine Culture     Status: Abnormal    Collection Time: 04/12/24  1:46 AM   Specimen: Urine, Random  Result Value Ref Range Status   Specimen Description URINE, RANDOM  Final   Special Requests NONE Reflexed from [id number]  Final   Culture (A)  Final    <10,000 COLONIES/mL INSIGNIFICANT GROWTH Performed at [facility], [address] [place], [place] [address]    Report Status 04/13/2024 FINAL  Final    Procedures and diagnostic studies:  No results found.    LOS: 8 days   Kaitlyn Wright  [facility]   Pager on [email or web address]. If 7PM-7AM, please contact night-coverage at [email or web address]     04/14/2024, 4:29 PM

## 2024-04-14 NOTE — Progress Notes (Signed)
 Mobility Specialist Progress Note:    04/14/24 1343  Mobility  Activity Ambulated with assistance in hallway  Level of Assistance Contact guard assist, steadying assist  Assistive Device Front wheel walker  Distance Ambulated (ft) 200 ft  Activity Response Tolerated well  Mobility Referral Yes  Mobility visit 1 Mobility  Mobility Specialist Start Time (ACUTE ONLY) 1343  Mobility Specialist Stop Time (ACUTE ONLY) 1355  Mobility Specialist Time Calculation (min) (ACUTE ONLY) 12 min   Pt agreeable to session. Pt is moving a lot better. No c/o any symptoms. Left pt in recliner w/ all needs met and MD in room.   Venetia Keel Mobility Specialist Please Neurosurgeon or Rehab Office at [phone]

## 2024-04-15 DIAGNOSIS — G9340 Encephalopathy, unspecified: Secondary | ICD-10-CM | POA: Diagnosis not present

## 2024-04-15 LAB — RENAL FUNCTION PANEL
Albumin: 2.8 g/dL — ABNORMAL LOW (ref 3.5–5.0)
Anion gap: 9 (ref 5–15)
BUN: 27 mg/dL — ABNORMAL HIGH (ref 8–23)
CO2: 26 mmol/L (ref 22–32)
Calcium: 8.9 mg/dL (ref 8.9–10.3)
Chloride: 105 mmol/L (ref 98–111)
Creatinine, Ser: 0.95 mg/dL (ref 0.44–1.00)
GFR, Estimated: >60 mL/min (ref >60)
Glucose, Bld: 98 mg/dL (ref 70–99)
Phosphorus: 4.6 mg/dL (ref 2.5–4.6)
Potassium: 4 mmol/L (ref 3.5–5.1)
Sodium: 140 mmol/L (ref 135–145)

## 2024-04-15 LAB — MAGNESIUM: Magnesium: 2.1 mg/dL (ref 1.7–2.4)

## 2024-04-15 NOTE — Plan of Care (Signed)
  Problem: Coping: Goal: Level of anxiety will decrease Outcome: Progressing   Problem: Activity: Goal: Risk for activity intolerance will decrease Outcome: Progressing   Problem: Safety: Goal: Ability to remain free from injury will improve 04/15/2024 2147 by Edith Darice BROCKS, RN Outcome: Not Progressing Pt. Refuses safety precautions (bed alarm)

## 2024-04-15 NOTE — Progress Notes (Signed)
 Progress Note    Kaitlyn Wright  FMW:[id number] DOB: 03/26/1946  DOA: 04/06/2024 PCP: Arloa Elsie SAUNDERS, MD      Brief Narrative:  Patient is an 78 year old female past medical history significant for CAD status post CABG in May 2025, complicated by postop PAF; elevated LFTs leading to discontinuation of amiodarone  towards the end of June and hypothyroidism.  Patient was admitted with acute encephalopathy and fall.  Hospital course has been complicated by intermittent hypotension requiring vasopressors.  Patient was also noted to have severe MR, but patient does not want any further cardiac workup (as per prior documentation).  Patient is awaiting disposition to skilled nursing facility.  Insurance approval is being awaited.  04/15/2024: Patient seen.  No new complaints.  Phosphorus is 4.6 today.  Assessment/Plan:   Principal Problem:   Acute encephalopathy Active Problems:   Rhabdomyolysis   Elevated liver enzymes   QT prolongation   CAD (coronary artery disease)   Hypotension    Body mass index is 19.72 kg/m.   Hypertension/shock:  -Etiology not entirely clear, -Initially admitted to the ICU and treated with IV vasopressor. -Continue midodrine  - Cortisol was checked 7/19, appropriately elevated at 42.1.  TSH unremarkable at 3.7 with normal free T4.   New severe mitral regurgitation: -Echocardiogram done 7/19 shows LVEF 65-70%, no WMA, grade 2 diastolic dysfunction.  RV was normal.   -Cardiology consulted and evaluated patient 7/21, then signed off.  She appears to be asymptomatic with this, and per cardiology, patient does not want a TEE at this point or further workup.   -Outpatient follow-up with [facility] cardiology group in [place].,     CAD status post CABG-no chest pain, this is stable Continue aspirin  and Plavix    Hypokalemia: - Resolved. - Potassium of 4 today.      Elevated LFTs: -Liver enzymes trending downward.  Probably from shock liver.   -Right  upper quadrant ultrasound fairly unremarkable, she has a history of cholecystectomy   Weakness/falls: -Pursue SNF.    Right arm numbness, weakness - Cervical spondylosis, multilevel foraminal stenosis.  Thoracic right C3-C4, bilaterally at C4-C5, bilaterally at C5-C6 and left C6-C7 Degenerative disc disease of cervical spine, multi-level mild grade 1 anterolisthesis.      Diet Order             Diet - low sodium heart healthy           Diet regular Room service appropriate? Yes; Fluid consistency: Thin  Diet effective now                   Consultants: Cardiology ICU team  Procedures: None    Medications:    aspirin  EC  81 mg Oral Daily   clopidogrel   75 mg Oral Daily   feeding supplement  237 mL Oral BID BM   heparin   5,000 Units Subcutaneous Q8H   latanoprost   1 drop Both Eyes QHS   levothyroxine   88 mcg Oral Q0600   midodrine   10 mg Oral TID WC   pantoprazole   40 mg Oral Daily   potassium chloride   40 mEq Oral Once   Continuous Infusions:   Anti-infectives (From admission, onward)    Start     Dose/Rate Route Frequency Ordered Stop   04/07/24 0000  cefTRIAXone  (ROCEPHIN ) 1 g in sodium chloride  0.9 % 100 mL IVPB        1 g 200 mL/hr over 30 Minutes Intravenous  Once 04/06/24 2354 04/07/24 0215  Family Communication/Anticipated D/C date and plan/Code Status   DVT prophylaxis: heparin  injection 5,000 Units Start: 04/07/24 0600 SCDs Start: 04/06/24 2351     Code Status: Full Code  Family Communication: I called [name], daughter, at [phone] at 10:55 AM today but there was no response.  Disposition Plan: Discharge to SNF   Status is: Inpatient Remains inpatient appropriate because: Awaiting placement to SNF       Subjective:   No new complaints.  Objective:    Vitals:   04/15/24 0342 04/15/24 0500 04/15/24 0758 04/15/24 1138  BP: (!) 108/47  (!) 117/56 126/69  Pulse: 62  74 84  Resp: 16  20 20   Temp: 98.4 F  (36.9 C)  98.7 F (37.1 C) 98 F (36.7 C)  TempSrc: Oral  Oral Oral  SpO2: 97%  97% 96%  Weight:  50.5 kg    Height:       No data found.   Intake/Output Summary (Last 24 hours) at 04/15/2024 1256 Last data filed at 04/15/2024 1100 Gross per 24 hour  Intake 240 ml  Output 800 ml  Net -560 ml   Filed Weights   04/13/24 0326 04/14/24 0500 04/15/24 0500  Weight: 50.8 kg 50.2 kg 50.5 kg    Exam:  GEN: Awake and alert.  Not in any distress. HEENT: Mild pallor.  No jaundice. Neck: Supple.  No raised JVD. Lungs: Clear to auscultation. CVS: S1-S2. Abdomen: Soft and nontender. Extremities: No leg edema. Neuro: Awake and alert.  Moves all extremities.    Data Reviewed:   I have personally reviewed following labs and imaging studies:  Labs: Labs show the following:   Basic Metabolic Panel: Recent Labs  Lab 04/09/24 0734 04/10/24 0437 04/11/24 0411 04/15/24 0335  NA 140 137 138 140  K 3.7 3.9 4.0 4.0  CL 105 105 104 105  CO2 26 23 24 26   GLUCOSE 130* 86 93 98  BUN 13 14 12  27*  CREATININE 0.86 0.86 1.04* 0.95  CALCIUM  8.3* 8.4* 8.8* 8.9  MG  --   --  1.8 2.1  PHOS  --   --  2.0* 4.6   GFR Estimated Creatinine Clearance: 37.7 mL/min (by C-G formula based on SCr of 0.95 mg/dL). Liver Function Tests: Recent Labs  Lab 04/09/24 0734 04/10/24 0437 04/11/24 0411 04/15/24 0335  AST 153* 124* 113*  --   ALT 185* 153* 138*  --   ALKPHOS 121 103 105  --   BILITOT 0.9 0.8 0.9  --   PROT 5.6* 5.3* 5.7*  --   ALBUMIN  2.6* 2.4* 2.6* 2.8*   No results for input(s): LIPASE, AMYLASE in the last 168 hours. No results for input(s): AMMONIA in the last 168 hours.  Coagulation profile No results for input(s): INR, PROTIME in the last 168 hours.   CBC: Recent Labs  Lab 04/09/24 0734 04/10/24 0437 04/11/24 0411  WBC 10.8* 10.7* 8.9  HGB 11.1* 12.0 11.9*  HCT 33.8* 36.1 36.6  MCV 91.8 92.8 93.8  PLT 242 244 265   Cardiac Enzymes: No results for  input(s): CKTOTAL, CKMB, CKMBINDEX, TROPONINI in the last 168 hours.  BNP (last 3 results) No results for input(s): PROBNP in the last 8760 hours. CBG: Recent Labs  Lab 04/08/24 2314  GLUCAP 136*   D-Dimer: No results for input(s): DDIMER in the last 72 hours. Hgb A1c: No results for input(s): HGBA1C in the last 72 hours. Lipid Profile: No results for input(s): CHOL, HDL, LDLCALC, TRIG, CHOLHDL,  LDLDIRECT in the last 72 hours. Thyroid  function studies: No results for input(s): TSH, T4TOTAL, T3FREE, THYROIDAB in the last 72 hours.  Invalid input(s): FREET3 Anemia work up: No results for input(s): VITAMINB12, FOLATE, FERRITIN, TIBC, IRON, RETICCTPCT in the last 72 hours. Sepsis Labs: Recent Labs  Lab 04/09/24 0734 04/10/24 0437 04/11/24 0411  WBC 10.8* 10.7* 8.9    Microbiology Recent Results (from the past 240 hours)  Blood Culture (routine x 2)     Status: None   Collection Time: 04/06/24  2:35 PM   Specimen: BLOOD  Result Value Ref Range Status   Specimen Description BLOOD RIGHT ANTECUBITAL  Final   Special Requests   Final    BOTTLES DRAWN AEROBIC AND ANAEROBIC Blood Culture results may not be optimal due to an inadequate volume of blood received in culture bottles   Culture   Final    NO GROWTH 5 DAYS Performed at [facility], [address], [place], [place] [address]    Report Status 04/11/2024 FINAL  Final  Blood Culture (routine x 2)     Status: Abnormal   Collection Time: 04/06/24  3:30 PM   Specimen: BLOOD LEFT ARM  Result Value Ref Range Status   Specimen Description BLOOD LEFT ARM  Final   Special Requests   Final    BOTTLES DRAWN AEROBIC AND ANAEROBIC Blood Culture adequate volume   Culture  Setup Time   Final    GRAM POSITIVE COCCI ANAEROBIC BOTTLE ONLY CRITICAL RESULT CALLED TO, READ BACK BY AND VERIFIED WITH: PHARMD C AMEND 04/07/2024 @ 2249 BY AB    Culture (A)  Final    STAPHYLOCOCCUS  EPIDERMIDIS THE SIGNIFICANCE OF ISOLATING THIS ORGANISM FROM A SINGLE SET OF BLOOD CULTURES WHEN MULTIPLE SETS ARE DRAWN IS UNCERTAIN. PLEASE NOTIFY THE MICROBIOLOGY DEPARTMENT WITHIN ONE WEEK IF SPECIATION AND SENSITIVITIES ARE REQUIRED. Performed at [facility], [address] [place], [place] [address]    Report Status 04/09/2024 FINAL  Final  Blood Culture ID Panel (Reflexed)     Status: Abnormal   Collection Time: 04/06/24  3:30 PM  Result Value Ref Range Status   Enterococcus faecalis NOT DETECTED NOT DETECTED Final   Enterococcus Faecium NOT DETECTED NOT DETECTED Final   Listeria monocytogenes NOT DETECTED NOT DETECTED Final   Staphylococcus species DETECTED (A) NOT DETECTED Final    Comment: CRITICAL RESULT CALLED TO, READ BACK BY AND VERIFIED WITH: PHARMD C AMEND 04/07/2024 @ 2249 BY AB    Staphylococcus aureus (BCID) NOT DETECTED NOT DETECTED Final   Staphylococcus epidermidis DETECTED (A) NOT DETECTED Final    Comment: Methicillin (oxacillin) resistant coagulase negative staphylococcus. Possible blood culture contaminant (unless isolated from more than one blood culture draw or clinical case suggests pathogenicity). No antibiotic treatment is indicated for blood  culture contaminants. CRITICAL RESULT CALLED TO, READ BACK BY AND VERIFIED WITH: PHARMD C AMEND 04/07/2024 @ 2249 BY AB    Staphylococcus lugdunensis NOT DETECTED NOT DETECTED Final   Streptococcus species NOT DETECTED NOT DETECTED Final   Streptococcus agalactiae NOT DETECTED NOT DETECTED Final   Streptococcus pneumoniae NOT DETECTED NOT DETECTED Final   Streptococcus pyogenes NOT DETECTED NOT DETECTED Final   A.calcoaceticus-baumannii NOT DETECTED NOT DETECTED Final   Bacteroides fragilis NOT DETECTED NOT DETECTED Final   Enterobacterales NOT DETECTED NOT DETECTED Final   Enterobacter cloacae complex NOT DETECTED NOT DETECTED Final   Escherichia coli NOT DETECTED NOT DETECTED Final   Klebsiella aerogenes  NOT  DETECTED NOT DETECTED Final   Klebsiella oxytoca NOT DETECTED NOT DETECTED Final   Klebsiella pneumoniae NOT DETECTED NOT DETECTED Final   Proteus species NOT DETECTED NOT DETECTED Final   Salmonella species NOT DETECTED NOT DETECTED Final   Serratia marcescens NOT DETECTED NOT DETECTED Final   Haemophilus influenzae NOT DETECTED NOT DETECTED Final   Neisseria meningitidis NOT DETECTED NOT DETECTED Final   Pseudomonas aeruginosa NOT DETECTED NOT DETECTED Final   Stenotrophomonas maltophilia NOT DETECTED NOT DETECTED Final   Candida albicans NOT DETECTED NOT DETECTED Final   Candida auris NOT DETECTED NOT DETECTED Final   Candida glabrata NOT DETECTED NOT DETECTED Final   Candida krusei NOT DETECTED NOT DETECTED Final   Candida parapsilosis NOT DETECTED NOT DETECTED Final   Candida tropicalis NOT DETECTED NOT DETECTED Final   Cryptococcus neoformans/gattii NOT DETECTED NOT DETECTED Final   Methicillin resistance mecA/C DETECTED (A) NOT DETECTED Final    Comment: CRITICAL RESULT CALLED TO, READ BACK BY AND VERIFIED WITH: PHARMD C AMEND 04/07/2024 @ 2249 BY AB Performed at [facility], [address], [place], [place] [address]   MRSA Next Gen by PCR, Nasal     Status: None   Collection Time: 04/06/24 11:50 PM   Specimen: Nasal Mucosa; Nasal Swab  Result Value Ref Range Status   MRSA by PCR Next Gen NOT DETECTED NOT DETECTED Final    Comment: (NOTE) The GeneXpert MRSA Assay (FDA approved for NASAL specimens only), is one component of a comprehensive MRSA colonization surveillance program. It is not intended to diagnose MRSA infection nor to guide or monitor treatment for MRSA infections. Test performance is not FDA approved in patients less than 92 years old. Performed at [facility], [address] [place], [place] [address]   Urine Culture     Status: Abnormal   Collection Time: 04/12/24  1:46 AM   Specimen: Urine, Random  Result Value Ref Range Status    Specimen Description URINE, RANDOM  Final   Special Requests NONE Reflexed from [id number]  Final   Culture (A)  Final    <10,000 COLONIES/mL INSIGNIFICANT GROWTH Performed at [facility], [address], [place], [place] [address]    Report Status 04/13/2024 FINAL  Final    Procedures and diagnostic studies:  No results found.    LOS: 9 days   Kaitlyn Wright  [facility]   Pager on [email or web address]. If 7PM-7AM, please contact night-coverage at [email or web address]     04/15/2024, 12:56 PM

## 2024-04-15 NOTE — Progress Notes (Signed)
 Mobility Specialist Progress Note:    04/15/24 1046  Mobility  Activity Ambulated with assistance in hallway  Level of Assistance Standby assist, set-up cues, supervision of patient - no hands on  Assistive Device Front wheel walker  Distance Ambulated (ft) 500 ft  Activity Response Tolerated well  Mobility Referral Yes  Mobility visit 1 Mobility  Mobility Specialist Start Time (ACUTE ONLY) 1046  Mobility Specialist Stop Time (ACUTE ONLY) 1128  Mobility Specialist Time Calculation (min) (ACUTE ONLY) 42 min   Pt agreeable and happy for session. Pt feeling better today wanting to walk further. Pt walked 221ft before needing to take a standing break. No c/o any symptoms. Left pt in bed w/ all needs met.   Venetia Keel Mobility Specialist Please Neurosurgeon or Rehab Office at [phone]

## 2024-04-16 ENCOUNTER — Other Ambulatory Visit (HOSPITAL_COMMUNITY): Payer: Self-pay

## 2024-04-16 DIAGNOSIS — G9340 Encephalopathy, unspecified: Secondary | ICD-10-CM | POA: Diagnosis not present

## 2024-04-16 MED ORDER — ENSURE PLUS HIGH PROTEIN PO LIQD
237.0000 mL | Freq: Two times a day (BID) | ORAL | 1 refills | Status: AC
  Filled 2024-04-16: qty 14220, 30d supply, fill #0

## 2024-04-16 MED ORDER — ACETAMINOPHEN 325 MG PO TABS: 650.0000 mg | ORAL_TABLET | Freq: Four times a day (QID) | ORAL | Status: AC | PRN

## 2024-04-16 MED ORDER — POLYETHYLENE GLYCOL 3350 17 GM/SCOOP PO POWD
17.0000 g | Freq: Every day | ORAL | 0 refills | Status: AC | PRN
  Filled 2024-04-16: qty 238, 14d supply, fill #0

## 2024-04-16 NOTE — TOC Progression Note (Addendum)
 Transition of Care [facility]) - Progression Note    Patient Details  Name: Kaitlyn Wright MRN: [id number] Date of Birth: [birth year]  Transition of Care [facility]) CM/SW Contact  Luise JAYSON Pan, [place] Phone Number: 04/16/2024, 11:01 AM  Clinical Narrative:   8:30AM CSW messaged facility admissions coordinator and awaiting response.   11:00 AM CSW left VM for Starr with admissions at [facility]. Awaiting to hear back on bed availability.   12:42 PM No bed at [facility] at this time. CSW notified patient and she stated she would like to go home with [facility]. CSW notified RNCM.   TOC will continue to follow.    Expected Discharge Plan: Skilled Nursing Facility Barriers to Discharge: Other (must enter comment) ([facility] bed availability)               Expected Discharge Plan and Services In-house Referral: Clinical Social Work   Post Acute Care Choice: Skilled Nursing Facility Living arrangements for the past 2 months: Single Family Home Expected Discharge Date: 04/12/24                                     Social Drivers of Health (SDOH) Interventions SDOH Screenings   Food Insecurity: No Food Insecurity (04/07/2024)  Housing: Low Risk  (04/07/2024)  Transportation Needs: No Transportation Needs (04/07/2024)  Utilities: Not At Risk (04/07/2024)  Social Connections: Moderately Isolated (04/07/2024)  Tobacco Use: Low Risk  (04/06/2024)    Readmission Risk Interventions     No data to display

## 2024-04-16 NOTE — Progress Notes (Signed)
 Reviewed AVS, patient expressed understanding of medications, MD follow up reviewed.   Removed IV, Site clean, dry and intact.  Patient states all belongings brought to the hospital at time of admission are accounted for and packed to take home.  Picked up medications from [facility] pharmacy. Pt transported to entrance A where family member was waiting in vehicle to transport home.

## 2024-04-16 NOTE — Progress Notes (Signed)
 Mobility Specialist: Progress Note   04/16/24 1630  Mobility  Activity Ambulated with assistance in hallway  Level of Assistance Standby assist, set-up cues, supervision of patient - no hands on  Assistive Device Front wheel walker  Distance Ambulated (ft) 500 ft  Activity Response Tolerated well  Mobility Referral Yes  Mobility visit 1 Mobility  Mobility Specialist Start Time (ACUTE ONLY) 1240  Mobility Specialist Stop Time (ACUTE ONLY) 1255  Mobility Specialist Time Calculation (min) (ACUTE ONLY) 15 min    Pt received in bed, agreeable to mobility session. Took 2 brief standing breaks d/t fatigue. No other complaints. Returned to room without fault. Left on EOB with all needs met, call bell in reach.   Ileana Lute Mobility Specialist Please contact via SecureChat or Rehab office at [phone]

## 2024-04-16 NOTE — Progress Notes (Signed)
 Mobility Specialist: Progress Note   04/16/24 1631  Mobility  Activity Ambulated with assistance in hallway  Level of Assistance Standby assist, set-up cues, supervision of patient - no hands on  Assistive Device Front wheel walker  Distance Ambulated (ft) 400 ft  Activity Response Tolerated well  Mobility Referral Yes  Mobility visit 1 Mobility  Mobility Specialist Start Time (ACUTE ONLY) 1522  Mobility Specialist Stop Time (ACUTE ONLY) 1540  Mobility Specialist Time Calculation (min) (ACUTE ONLY) 18 min    Pt received on EOB, agreeable to mobility session. No complaints. VSS on RA. Returned to room without fault. Left on EOB with all needs met, call bell in reach.    Ileana Lute Mobility Specialist Please contact via SecureChat or Rehab office at [phone]

## 2024-04-16 NOTE — Discharge Summary (Signed)
 Physician Discharge Summary  Patient ID: Kaitlyn Wright MRN: [id number] DOB/AGE: [birth year] 80 y.o.  Admit date: 04/06/2024 Discharge date: 04/16/2024  Admission Diagnoses:  Discharge Diagnoses:  Principal Problem:   Acute encephalopathy Active Problems:   Rhabdomyolysis   Elevated liver enzymes   QT prolongation   CAD (coronary artery disease)   Hypotension   Discharged Condition: stable  Hospital Course:   Consults: cardiology and pulmonary/intensive care  Significant Diagnostic Studies:  CT head without contrast revealed: -Age-related atrophy and mild periventricular white matter disease. No evidence of acute traumatic injury.   CT maxillofacial without contrast revealed: 1. No evidence of acute traumatic injury.    MRI HEAD WITHOUT CONTRAST   MRI CERVICAL SPINE WITHOUT CONTRAST   TECHNIQUE: Multiplanar, multiecho pulse sequences of the brain and surrounding structures, and cervical spine, to include the craniocervical junction and cervicothoracic junction, were obtained without intravenous contrast.   COMPARISON:  Head CT 04/06/2024.   FINDINGS: MRI HEAD FINDINGS   Brain:   Generalized cerebral atrophy.   Multifocal T2 FLAIR hyperintense signal abnormality within the cerebral white matter, nonspecific but compatible with mild chronic small vessel ischemic disease.   Few nonspecific punctate chronic microhemorrhages scattered within the supratentorial and infratentorial brain.   There is no acute infarct.   No evidence of an intracranial mass.   No chronic intracranial blood products.   No extra-axial fluid collection.   No midline shift.   Vascular: Maintained flow voids within the proximal large arterial vessels.   Skull and upper cervical spine: No focal worrisome lesion.   Sinuses/Orbits: No mass or acute finding within the imaged orbits. No significant paranasal sinus disease.   MRI CERVICAL SPINE FINDINGS   Alignment: Mild grade 1  anterolisthesis at C2-C3, C7-T1, T1-T2, T2-T3 and T3-T4.   Vertebrae: Cervical vertebral body height is maintained. No significant marrow edema or focal worrisome marrow lesion.   Cord: No signal abnormality identified within the cervical spinal cord.   Posterior Fossa, vertebral arteries, paraspinal tissues: Posterior fossa assessed on same-day brain MRI. Flow voids preserved within portions of the cervical vertebral arteries. No paraspinal mass or collection.   Disc levels:   Multilevel disc degeneration, greatest at C4-C5 and C5-C6 (moderate-to-advanced at these levels).   C2-C3: Mild grade 1 anterolisthesis. Slight disc uncovering. No significant disc herniation or stenosis.   C3-C4: Shallow disc bulge. Uncovertebral hypertrophy (greater on the right). Facet hypertrophy (predominantly on the right). No significant spinal canal stenosis. Moderate-to-severe right neural foraminal narrowing.   C4-C5: Posterior disc osteophyte complex with bilateral disc osteophyte ridge/uncinate hypertrophy. No significant spinal canal stenosis. Bilateral neural foraminal narrowing (moderate right, severe left).   C5-C6: Posterior disc osteophyte complex with left greater than right disc osteophyte ridge/uncinate hypertrophy. No significant spinal canal stenosis. Bilateral neural foraminal narrowing (moderate right, severe left).   C6-C7: Posterior disc osteophyte complex with bilateral disc osteophyte ridge/uncinate hypertrophy. Ligamentum flavum thickening. Mild spinal canal stenosis. Bilateral neural foraminal narrowing (mild-to-moderate right, moderate left).   C7-T1: Mild grade 1 anterolisthesis. Mild facet arthropathy (greater on the right). Slight disc uncovering. No significant disc herniation or stenosis.   IMPRESSION: MRI brain:   1. No evidence of an acute intracranial abnormality. 2. Mild chronic small vessel ischemic changes within cerebral white matter. 3. Few  nonspecific punctate chronic microhemorrhages scattered within the supratentorial and infratentorial brain. 4. Generalized cerebral atrophy.   MRI cervical spine:   1. Cervical spondylosis as outlined within the body of the report. 2. No more than  mild spinal canal stenosis. 3. Multilevel foraminal stenosis, greatest on the right at C3-C4 (moderate-to-severe), bilaterally at C4-C5 (moderate right, severe left), bilaterally at C5-C6 (moderate right, severe left) and on the left at C6-C7 (moderate). 4. Disc degeneration is greatest at C4-C5 and C5-C6 (moderate-to-advanced at these levels). 5. Mild grade 1 anterolisthesis at C2-C3, C7-T1, T1-T2, T2-T3 and T3-T4.     Electronically Signed   By: Rockey Childs D.O.   On: 04/10/2024 09:19     ULTRASOUND ABDOMEN LIMITED RIGHT UPPER QUADRANT   COMPARISON:  CT Abdomen and Pelvis 03/14/2024.   FINDINGS: Gallbladder:   Surgically absent.   Common bile duct:   Appears decreased in size from the CT last month, estimated 6 mm diameter (normal).   Liver:   No focal lesion identified. Within normal limits in parenchymal echogenicity. Portal vein is patent on color Doppler imaging with normal direction of blood flow towards the liver.   Other: Negative visible right kidney. No free fluid. Possible small right pleural effusion (image 17).   IMPRESSION: 1. Possible small Right pleural effusion. 2. Otherwise negative right upper quadrant ultrasound status post Cholecystectomy.     Electronically Signed   By: VEAR Hurst M.D.   On: 04/08/2024 09:15    Discharge Exam: Blood pressure 121/60, pulse 72, temperature 98.3 F (36.8 C), temperature source Oral, resp. rate 20, height 5' 3 (1.6 m), weight 49.5 kg, SpO2 96%.   Disposition: Discharge disposition: 06-Home-Health Care Svc       Discharge Instructions     Diet - low sodium heart healthy   Complete by: As directed    Diet - low sodium heart healthy   Complete by: As  directed    Discharge wound care:   Complete by: As directed    Continue current wound care plan   Increase activity slowly   Complete by: As directed    Increase activity slowly   Complete by: As directed       Allergies as of 04/16/2024       Reactions   Naprosyn  [naproxen ]    Upset stomach   Latex Rash        Medication List     STOP taking these medications    ascorbic acid 500 MG tablet Commonly known as: VITAMIN C   Cinnamon 500 MG capsule   cyclobenzaprine 5 MG tablet Commonly known as: FLEXERIL   Fish Oil 1000 MG Cpdr   hydrOXYzine  10 MG tablet Commonly known as: ATARAX    Krill Oil 1000 MG Caps   loperamide  1 MG/5ML solution Commonly known as: IMODIUM    metoprolol  tartrate 25 MG tablet Commonly known as: LOPRESSOR    ondansetron  4 MG tablet Commonly known as: ZOFRAN    Red Yeast Rice 600 MG Caps   traMADol  50 MG tablet Commonly known as: ULTRAM        TAKE these medications    acetaminophen  325 MG tablet Commonly known as: TYLENOL  Take 2 tablets (650 mg total) by mouth every 6 (six) hours as needed for mild pain (pain score 1-3).   aspirin  EC 81 MG tablet Take 1 tablet (81 mg total) by mouth daily. Swallow whole.   atorvastatin  80 MG tablet Commonly known as: LIPITOR  Take 1 tablet (80 mg total) by mouth daily at 6 PM.   B-complex with vitamin C tablet Take 1 tablet by mouth daily.   cholecalciferol  1000 units tablet Commonly known as: VITAMIN D  Take 1,000 Units by mouth daily.   clopidogrel  75 MG tablet  Commonly known as: PLAVIX  Take 1 tablet (75 mg total) by mouth daily.   feeding supplement Liqd Take 237 mLs by mouth 2 (two) times daily between meals. Start taking on: April 17, 2024   latanoprost  0.005 % ophthalmic solution Commonly known as: XALATAN  Place 1 drop into both eyes at bedtime.   levothyroxine  88 MCG tablet Commonly known as: SYNTHROID  Take 1 tablet (88 mcg total) by mouth daily.   midodrine  10 MG  tablet Commonly known as: PROAMATINE  Take 1 tablet (10 mg total) by mouth 3 (three) times daily with meals.   pantoprazole  40 MG tablet Commonly known as: PROTONIX  Take 1 tablet (40 mg total) by mouth daily.   polyethylene glycol 17 g packet Commonly known as: MIRALAX  / GLYCOLAX  Take 17 g by mouth daily as needed for moderate constipation.   SYSTANE FREE OP Place 1 drop into both eyes 2 (two) times daily.               Discharge Care Instructions  (From admission, onward)           Start     Ordered   04/16/24 0000  Discharge wound care:       Comments: Continue current wound care plan   04/16/24 1543            Contact information for follow-up providers     [facility] [employer]. Schedule an appointment as soon as possible for a visit in 1 month(s).   Specialty: Cardiology Why: Severe mitral regurgitation Contact information: [address] [place]  [address] [phone]        Arloa Elsie SAUNDERS, MD. Go on 04/24/2024.   Specialty: Family Medicine Why: @10 :45am Contact information: [address] [place] [address] [phone]              Contact information for after-discharge care     Destination     [facility], [place] .   Service: Skilled Nursing Contact information: [address] [place]  [address] [phone]                    Time spent: 35 minutes.  SignedBETHA Leatrice LILLETTE Rosario 04/16/2024, 3:43 PM

## 2024-04-16 NOTE — Progress Notes (Signed)
    Durable Medical Equipment  (From admission, onward)           Start     Ordered   04/16/24 1615  For home use only DME Other see comment  Once       Comments: Showerchair  Question:  Length of Need  Answer:  6 Months   04/16/24 1615   04/16/24 1614  For home use only DME Bedside commode  Once       Comments: Confine to one room  Question:  Patient needs a bedside commode to treat with the following condition  Answer:  Gait instability   04/16/24 1615

## 2024-04-16 NOTE — TOC Transition Note (Signed)
 Transition of Care [facility]) - Discharge Note   Patient Details  Name: Kaitlyn Wright MRN: [id number] Date of Birth: Oct 05, 1945  Transition of Care [facility]) CM/SW Contact:  Rosalva Jon Bloch, RN Phone Number: 04/16/2024, 5:04 PM   Clinical Narrative:    Patient will DC to: home Anticipated DC date: 04/16/2024 Family notified: yes Transport by: car  Per MD patient ready for DC today. RN, patient, and  patient's daughter notified of DC. Pt states daughter and friend s to assist with care once home. Pt declined SNF PLACEMENT.Pt agreeable to home health services. Pt without provider preference. Referral made with [facility] and accepted. Referral made with [employer] for DME  needs , BSC and showerchair. Equipment will be delivered to pt's home., pt aware.  Post hospital f/u noted on AVS. Pt without RX med concerns. Friend to provide transportation to home .  RNCM will sign off for now as intervention is no longer needed. Please consult us  again if new needs arise.    Final next level of care: Home w Home Health Services Barriers to Discharge: No Barriers Identified   Patient Goals and CMS Choice Patient states their goals for this hospitalization and ongoing recovery are:: SNF CMS Medicare.gov Compare Post Acute Care list provided to:: Patient Choice offered to / list presented to : Patient      Discharge Placement                       Discharge Plan and Services Additional resources added to the After Visit Summary for   In-house Referral: Clinical Social Work   Post Acute Care Choice: Skilled Nursing Facility          DME Arranged: Bedside commode, Other see comment Hydrographic surveyor) DME Agency: [employer] Date DME Agency Contacted: 04/16/24 Time DME Agency Contacted: 1702   HH Arranged: PT, OT HH Agency: [facility], Other - See comment Date HH Agency Contacted: 04/16/24 Time HH Agency Contacted: 1612 Representative spoke with at [facility] Agency:  [place]  Social Drivers of Health (SDOH) Interventions SDOH Screenings   Food Insecurity: No Food Insecurity (04/07/2024)  Housing: Low Risk  (04/07/2024)  Transportation Needs: No Transportation Needs (04/07/2024)  Utilities: Not At Risk (04/07/2024)  Social Connections: Moderately Isolated (04/07/2024)  Tobacco Use: Low Risk  (04/06/2024)     Readmission Risk Interventions     No data to display

## 2024-04-18 DIAGNOSIS — I2510 Atherosclerotic heart disease of native coronary artery without angina pectoris: Secondary | ICD-10-CM | POA: Diagnosis not present

## 2024-04-18 DIAGNOSIS — I051 Rheumatic mitral insufficiency: Secondary | ICD-10-CM | POA: Diagnosis not present

## 2024-04-18 DIAGNOSIS — I252 Old myocardial infarction: Secondary | ICD-10-CM | POA: Diagnosis not present

## 2024-04-18 DIAGNOSIS — F419 Anxiety disorder, unspecified: Secondary | ICD-10-CM | POA: Diagnosis not present

## 2024-04-18 DIAGNOSIS — I959 Hypotension, unspecified: Secondary | ICD-10-CM | POA: Diagnosis not present

## 2024-04-18 DIAGNOSIS — I10 Essential (primary) hypertension: Secondary | ICD-10-CM | POA: Diagnosis not present

## 2024-04-18 DIAGNOSIS — F32A Depression, unspecified: Secondary | ICD-10-CM | POA: Diagnosis not present

## 2024-04-18 DIAGNOSIS — I480 Paroxysmal atrial fibrillation: Secondary | ICD-10-CM | POA: Diagnosis not present

## 2024-04-18 DIAGNOSIS — T797XXD Traumatic subcutaneous emphysema, subsequent encounter: Secondary | ICD-10-CM | POA: Diagnosis not present

## 2024-04-23 ENCOUNTER — Telehealth: Payer: Self-pay | Admitting: Pharmacist

## 2024-04-23 NOTE — Progress Notes (Addendum)
   04/23/2024  Patient ID: Kaitlyn Wright, female   DOB: [birth year], 78 y.o.   MRN: [id number]  Received referral from Dr. Arloa regarding medication review.  Tried calling the patient on both her home phone and cell phone per DPR. Only able to leave voicemail on the home phone. Left message reminding her of PCP visit tomorrow and strongly recommended she bring her medications to this appointment for reconciliation. Hydroxyzine  added to the list.   Recent hospital discharge on 7/19 to 7/28  Recent fill history: Xanax  7/11 or 6/13 30DS- advised to stop Atorvastatin  7/9 90DS Plavix  7/9 90DS Levothyroxine  7/3 90DS Lopressor  7/28 900DS (already told to stop)- should be doing Toprol  XL instead Midodrine  7/24 5DS (should've ran out) Vascepa 6/13 30DS (from rehab? should've ran out) Pantoprazole  7/8 90DS KDur + Torsemide  6/13 30DS (should've ran out) Escitalopram 8/6 90DS Hydroxyzine  7/11 90DS Cyclobenzaprine 7/11 90DS  Update from 8/12: Attempt #2. Unable to reach. But left another voicemail. Will try again in 1 week.    Aloysius Lewis, PharmD [facility]  Phone Number: [phone]

## 2024-04-24 DIAGNOSIS — E039 Hypothyroidism, unspecified: Secondary | ICD-10-CM | POA: Diagnosis not present

## 2024-04-24 DIAGNOSIS — Z0000 Encounter for general adult medical examination without abnormal findings: Secondary | ICD-10-CM | POA: Diagnosis not present

## 2024-04-24 DIAGNOSIS — E7800 Pure hypercholesterolemia, unspecified: Secondary | ICD-10-CM | POA: Diagnosis not present

## 2024-04-24 DIAGNOSIS — F5101 Primary insomnia: Secondary | ICD-10-CM | POA: Diagnosis not present

## 2024-04-24 DIAGNOSIS — K219 Gastro-esophageal reflux disease without esophagitis: Secondary | ICD-10-CM | POA: Diagnosis not present

## 2024-04-24 DIAGNOSIS — F419 Anxiety disorder, unspecified: Secondary | ICD-10-CM | POA: Diagnosis not present

## 2024-04-24 DIAGNOSIS — I2510 Atherosclerotic heart disease of native coronary artery without angina pectoris: Secondary | ICD-10-CM | POA: Diagnosis not present

## 2024-04-24 DIAGNOSIS — R7303 Prediabetes: Secondary | ICD-10-CM | POA: Diagnosis not present

## 2024-04-24 DIAGNOSIS — R945 Abnormal results of liver function studies: Secondary | ICD-10-CM | POA: Diagnosis not present

## 2024-04-26 DIAGNOSIS — I252 Old myocardial infarction: Secondary | ICD-10-CM | POA: Diagnosis not present

## 2024-04-26 DIAGNOSIS — F419 Anxiety disorder, unspecified: Secondary | ICD-10-CM | POA: Diagnosis not present

## 2024-04-26 DIAGNOSIS — I2510 Atherosclerotic heart disease of native coronary artery without angina pectoris: Secondary | ICD-10-CM | POA: Diagnosis not present

## 2024-04-26 DIAGNOSIS — I959 Hypotension, unspecified: Secondary | ICD-10-CM | POA: Diagnosis not present

## 2024-04-26 DIAGNOSIS — I051 Rheumatic mitral insufficiency: Secondary | ICD-10-CM | POA: Diagnosis not present

## 2024-04-26 DIAGNOSIS — I480 Paroxysmal atrial fibrillation: Secondary | ICD-10-CM | POA: Diagnosis not present

## 2024-04-26 DIAGNOSIS — I10 Essential (primary) hypertension: Secondary | ICD-10-CM | POA: Diagnosis not present

## 2024-04-26 DIAGNOSIS — F32A Depression, unspecified: Secondary | ICD-10-CM | POA: Diagnosis not present

## 2024-04-26 DIAGNOSIS — T797XXD Traumatic subcutaneous emphysema, subsequent encounter: Secondary | ICD-10-CM | POA: Diagnosis not present

## 2024-05-02 DIAGNOSIS — I480 Paroxysmal atrial fibrillation: Secondary | ICD-10-CM | POA: Diagnosis not present

## 2024-05-02 DIAGNOSIS — F32A Depression, unspecified: Secondary | ICD-10-CM | POA: Diagnosis not present

## 2024-05-02 DIAGNOSIS — T797XXD Traumatic subcutaneous emphysema, subsequent encounter: Secondary | ICD-10-CM | POA: Diagnosis not present

## 2024-05-02 DIAGNOSIS — F419 Anxiety disorder, unspecified: Secondary | ICD-10-CM | POA: Diagnosis not present

## 2024-05-02 DIAGNOSIS — I2510 Atherosclerotic heart disease of native coronary artery without angina pectoris: Secondary | ICD-10-CM | POA: Diagnosis not present

## 2024-05-02 DIAGNOSIS — I10 Essential (primary) hypertension: Secondary | ICD-10-CM | POA: Diagnosis not present

## 2024-05-02 DIAGNOSIS — I252 Old myocardial infarction: Secondary | ICD-10-CM | POA: Diagnosis not present

## 2024-05-02 DIAGNOSIS — I051 Rheumatic mitral insufficiency: Secondary | ICD-10-CM | POA: Diagnosis not present

## 2024-05-02 DIAGNOSIS — I959 Hypotension, unspecified: Secondary | ICD-10-CM | POA: Diagnosis not present

## 2024-05-05 DIAGNOSIS — T797XXD Traumatic subcutaneous emphysema, subsequent encounter: Secondary | ICD-10-CM | POA: Diagnosis not present

## 2024-05-05 DIAGNOSIS — I051 Rheumatic mitral insufficiency: Secondary | ICD-10-CM | POA: Diagnosis not present

## 2024-05-05 DIAGNOSIS — I959 Hypotension, unspecified: Secondary | ICD-10-CM | POA: Diagnosis not present

## 2024-05-05 DIAGNOSIS — I2510 Atherosclerotic heart disease of native coronary artery without angina pectoris: Secondary | ICD-10-CM | POA: Diagnosis not present

## 2024-05-05 DIAGNOSIS — I480 Paroxysmal atrial fibrillation: Secondary | ICD-10-CM | POA: Diagnosis not present

## 2024-05-05 DIAGNOSIS — F32A Depression, unspecified: Secondary | ICD-10-CM | POA: Diagnosis not present

## 2024-05-05 DIAGNOSIS — I252 Old myocardial infarction: Secondary | ICD-10-CM | POA: Diagnosis not present

## 2024-05-05 DIAGNOSIS — I10 Essential (primary) hypertension: Secondary | ICD-10-CM | POA: Diagnosis not present

## 2024-05-05 DIAGNOSIS — F419 Anxiety disorder, unspecified: Secondary | ICD-10-CM | POA: Diagnosis not present

## 2024-05-07 NOTE — Addendum Note (Signed)
 Addended by: Lizzet Hendley M on: 05/07/2024 03:53 PM   Modules accepted: Orders

## 2024-05-10 DIAGNOSIS — T797XXD Traumatic subcutaneous emphysema, subsequent encounter: Secondary | ICD-10-CM | POA: Diagnosis not present

## 2024-05-10 DIAGNOSIS — I252 Old myocardial infarction: Secondary | ICD-10-CM | POA: Diagnosis not present

## 2024-05-10 DIAGNOSIS — I480 Paroxysmal atrial fibrillation: Secondary | ICD-10-CM | POA: Diagnosis not present

## 2024-05-10 DIAGNOSIS — I10 Essential (primary) hypertension: Secondary | ICD-10-CM | POA: Diagnosis not present

## 2024-05-10 DIAGNOSIS — I959 Hypotension, unspecified: Secondary | ICD-10-CM | POA: Diagnosis not present

## 2024-05-10 DIAGNOSIS — I2510 Atherosclerotic heart disease of native coronary artery without angina pectoris: Secondary | ICD-10-CM | POA: Diagnosis not present

## 2024-05-10 DIAGNOSIS — I051 Rheumatic mitral insufficiency: Secondary | ICD-10-CM | POA: Diagnosis not present

## 2024-05-10 DIAGNOSIS — F32A Depression, unspecified: Secondary | ICD-10-CM | POA: Diagnosis not present

## 2024-05-10 DIAGNOSIS — F419 Anxiety disorder, unspecified: Secondary | ICD-10-CM | POA: Diagnosis not present

## 2024-05-10 NOTE — Progress Notes (Unsigned)
 Cardiology Office Note   Date:  05/11/2024  ID:  MONAE TOPPING, DOB May 15, 1946, MRN [id number] PCP: Arloa Elsie SAUNDERS, MD  [facility] Providers Cardiologist:  Gordy Bergamo, MD  Berg[place]  History of Present Illness Kaitlyn Wright is a 78 y.o. female with a past medical history of coronary artery disease status post bypass surgery, postoperative atrial fibrillation who presents for follow-up appointment.  She underwent coronary bypass grafting May 2025 after presenting with severe substernal chest pressure.  Initial workup in the ED showed elevated troponin levels and EKG with T wave inversions in inferior lateral leads.  Cardiac catheterization improved severe diffuse coronary calcification, leading to recommendation for bypass surgery.  Underwent surgery with LIMA to LAD, SVG to PDA, and SVG to ramus with endoscopic harvest of the right greater saphenous vein.  Postoperatively, she required vasopressor and inotropic support due to pulmonary edema and respiratory failure but was eventually weaned off medications discharged to rehab on Feb 15, 2024.  When she was last seen in the office she was having a frustrating experience at rehab.  She was ambulating with a rollator though has limited mobility due to chronic back pain and longstanding balance issues.  Since discharge she was seen in the emergency department March 13, 2024 for abdominal pain, frequent nausea, and decreased oral intake.  Lab work showed transaminitis with elevated AST, ALT, ALK Foss levels.  No no chest pain or shortness of breath, no palpitations or swelling in the legs since discharge.  She was confused about medication leading to fear of taking wrong pills and she has not taken anything except Xanax  for the last week.  Currently supposed to be on amiodarone  200 mg daily, aspirin  81 mg daily, atorvastatin  80 mg daily, Plavix  75 mg daily and torsemide  20 mg twice weekly with as needed doses for weight gain.  She wants to only take  necessary medications and prefers generic prescriptions due to cost concerns.  She is able to walk with a walker from chair to chair in her living room but experienced significant back pain which she attributes to not being able to visit her chiropractor since surgery.  She has used a walker since returning home from rehab.  No pain at the surgical incision site.  Today, she presents with coronary artery disease and atrial fibrillation who presents for medication review and management.  She has undergone coronary bypass grafting back in May and is currently on atorvastatin  80 mg, clopidogrel , and metoprolol  succinate. She experiences no recent episodes of irregular heartbeat, palpitations, or peripheral edema. Her medication regimen also includes Synthroid , Lexapro, hydroxyzine , and MiraLAX . She takes supplements including vitamin C, vitamin D , B complex, cinnamon, CoQ10, garlic, and krill oil. She was previously on midodrine  for hypotension but has discontinued it.  Reports no shortness of breath nor dyspnea on exertion. Reports no chest pain, pressure, or tightness. No orthopnea, PND. Reports no palpitations.   Discussed the use of AI scribe software for clinical note transcription with the patient, who gave verbal consent to proceed.  ROS: pertinent ROS in HPI  Studies Reviewed      IMPRESSIONS     1. Left ventricular ejection fraction, by estimation, is 65 to 70%. The  left ventricle has normal function. The left ventricle has no regional  wall motion abnormalities. Left ventricular diastolic parameters are  consistent with Grade II diastolic  dysfunction (pseudonormalization). Elevated left atrial pressure.   2. Right ventricular systolic function is normal. The right ventricular  size is normal.   3. Suspect flail middle (P2) scallop of the posterior mitral leaflet,  although chordal rupture is not clearly visualized. The mitral valve is  abnormal. Severe mitral valve  regurgitation.   4. Tricuspid valve regurgitation is mild to moderate.   5. The aortic valve is tricuspid. There is mild calcification of the  aortic valve. Aortic valve regurgitation is mild. Aortic valve  sclerosis/calcification is present, without any evidence of aortic  stenosis.   Comparison(s): Changes from prior study are noted. There is now severe  eccentric mitral insufficiency. Recommend TEE if the patient is a  candidate for surgical repair or TEER (mitraClip).        Physical Exam VS:  BP 124/74   Pulse 83   Ht 5' 3 (1.6 m)   Wt 110 lb (49.9 kg)   SpO2 97%   BMI 19.49 kg/m        Wt Readings from Last 3 Encounters:  05/11/24 110 lb (49.9 kg)  04/16/24 109 lb 1.6 oz (49.5 kg)  03/20/24 115 lb (52.2 kg)    GEN: Well nourished, well developed in no acute distress NECK: No JVD; + carotid bruits CARDIAC: RRR, + systolic murmur, rubs, gallops RESPIRATORY:  Clear to auscultation without rales, wheezing or rhonchi  ABDOMEN: Soft, non-tender, non-distended EXTREMITIES:  Trace bilateral edema; No deformity   ASSESSMENT AND PLAN  Severe mitral regurgitation - The mitral valve was completely normal back in May and now on recent echocardiogram last month it shows there is a suspected flail P2 scallop of the posterior mitral leaflet although chordal rupture is not clearly visualized and now mentions severe mitral valve regurgitation and possible need for MitraClip -Patient is asymptomatic with only mild lower extremity edema and no shortness of breath mention today -She tells me that she does not want further surgery -Will send this to Dr. Ladona to review echocardiogram since there is such a big difference -will defer TEE for now  Coronary artery disease s/p CABG in May 2025 -She preferred to simplify her medication regimen and was encouraged to continue aspirin , atorvastatin , clopidogrel , metoprolol . - Plan to check a lipid panel next week when she sees her PCP to check  LFTs - Highly recommended cardiac rehab for improvement of mobility and strength  Postoperative atrial fibrillation -no palpitations - Continue current medication regimen with metoprolol  succinate for rate control, normal sinus rhythm today  Postoperative cardiogenic shock with RV failure -Well compensated today without any significant shortness of breath or lower extremity edema -Continue current medication regimen  Transaminitis Likely due to frequent Tylenol  use. Advised to limit use to prevent liver damage. - Avoid Tylenol  use. - Recheck liver function tests during next lab visit. - Consider alternative pain management options such as pain patches, creams, and Flexeril.  Chronic musculoskeletal pain, right lower thoracic/rib area Possibly exacerbated by recent fall. Differential includes musculoskeletal pain, bone bruise, or hairline fracture. Advised to limit Tylenol  due to liver concerns. - Try pain patches or creams for pain relief. - Use Flexeril at night for muscle relaxation. - Consult primary care physician for further evaluation if pain persists.  Impaired mobility and right hand weakness, under therapy Under therapy with exercises for hand strength and mobility. Discussed potential benefits of cardiac rehab for overall improvement. She expressed reluctance but will consider. - Consider cardiac [facility] program at [facility]. - Discuss with primary care physician about current therapy effectiveness.  Bilateral carotid artery disease -recommended annual imaging  Dispo: She can follow-up in 3 months with Dr. Ladona  Signed, Orren LOISE Fabry, PA-C

## 2024-05-11 ENCOUNTER — Ambulatory Visit: Attending: Physician Assistant | Admitting: Physician Assistant

## 2024-05-11 ENCOUNTER — Telehealth (HOSPITAL_COMMUNITY): Payer: Self-pay

## 2024-05-11 ENCOUNTER — Encounter: Payer: Self-pay | Admitting: Physician Assistant

## 2024-05-11 VITALS — BP 124/74 | HR 83 | Ht 63.0 in | Wt 110.0 lb

## 2024-05-11 DIAGNOSIS — I214 Non-ST elevation (NSTEMI) myocardial infarction: Secondary | ICD-10-CM

## 2024-05-11 DIAGNOSIS — Z951 Presence of aortocoronary bypass graft: Secondary | ICD-10-CM

## 2024-05-11 DIAGNOSIS — I4891 Unspecified atrial fibrillation: Secondary | ICD-10-CM

## 2024-05-11 DIAGNOSIS — I9789 Other postprocedural complications and disorders of the circulatory system, not elsewhere classified: Secondary | ICD-10-CM | POA: Diagnosis not present

## 2024-05-11 NOTE — Telephone Encounter (Signed)
 Called patient to go over cardiac rehab program, patient is interested but only if her insurance covers the program at 100%. Will verify insurance. Per Carlette, patient is ready to schedule once an MD signs the referral.

## 2024-05-11 NOTE — Addendum Note (Signed)
 Addended by: VICCI ROXIE CROME on: 05/11/2024 12:38 PM   Modules accepted: Orders

## 2024-05-11 NOTE — Patient Instructions (Signed)
 Medication Instructions:  Your physician recommends that you continue on your current medications as directed. Please refer to the Current Medication list given to you today.  Please review your medication list.  *If you need a refill on your cardiac medications before your next appointment, please call your pharmacy*  Lab Work: Please have your primary care provider add a lipid panel to your labwork when you see them.  If you have labs (blood work) drawn today and your tests are completely normal, you will receive your results only by: MyChart Message (if you have MyChart) OR A paper copy in the mail If you have any lab test that is abnormal or we need to change your treatment, we will call you to review the results.  Follow-Up: At [facility], you and your health needs are our priority.  As part of our continuing mission to provide you with exceptional heart care, our providers are all part of one team.  This team includes your primary Cardiologist (physician) and Advanced Practice Providers or APPs (Physician Assistants and Nurse Practitioners) who all work together to provide you with the care you need, when you need it.  Your next appointment:   3 month(s)  Provider:   Gordy Bergamo, MD  We recommend signing up for the patient portal called MyChart.  Sign up information is provided on this After Visit Summary.  MyChart is used to connect with patients for Virtual Visits (Telemedicine).  Patients are able to view lab/test results, encounter notes, upcoming appointments, etc.  Non-urgent messages can be sent to your provider as well.    To learn more about what you can do with MyChart, go to [email or web address].   Other Instructions You have been referred to Cardiac Rehab with [facility]. A scheduler will call you to schedule this appointment. Please allow 2-4 weeks for them to call with a start date.

## 2024-05-14 ENCOUNTER — Telehealth (HOSPITAL_COMMUNITY): Payer: Self-pay

## 2024-05-14 DIAGNOSIS — E7800 Pure hypercholesterolemia, unspecified: Secondary | ICD-10-CM | POA: Diagnosis not present

## 2024-05-14 DIAGNOSIS — R945 Abnormal results of liver function studies: Secondary | ICD-10-CM | POA: Diagnosis not present

## 2024-05-14 NOTE — Telephone Encounter (Signed)
 Pt insurance is active and benefits verified through [facility]. Co-pay $25, DED $0/$0 met, out of pocket $6,750/$4,103.13 met, co-insurance 0%. No pre-authorization required. Passport, 05/14/2024 @ 12:29pm, REF# [id number].  TCR/ICR? ICR Visit(date of service)limitation? No Can multiple codes be used on the same date of service/visit?(IF ITS A LIMIT) Yes  Is this a lifetime maximum or an annual maximum? Annual Has the member used any of these services to date? No Is there a time limit (weeks/months) on start of program and/or program completion? No

## 2024-05-15 ENCOUNTER — Telehealth (HOSPITAL_COMMUNITY): Payer: Self-pay

## 2024-05-15 NOTE — Telephone Encounter (Signed)
 Called patient regarding cardiac rehab, went over her insurance benefits. Patient states she cannot afford any out of pocket costs at this time and declined cardiac rehab. Informed patient if she changes her mind she can call us  back, when she does meet her OOP max she will be covered at 100% for her classes.  Closing referral.

## 2024-06-02 ENCOUNTER — Emergency Department (HOSPITAL_COMMUNITY)

## 2024-06-02 ENCOUNTER — Inpatient Hospital Stay (HOSPITAL_COMMUNITY)
Admission: EM | Admit: 2024-06-02 | Discharge: 2024-06-11 | DRG: 871 | Disposition: A | Discharge status: Skilled Nursing Facility | Attending: Internal Medicine | Admitting: Internal Medicine

## 2024-06-02 ENCOUNTER — Other Ambulatory Visit: Payer: Self-pay

## 2024-06-02 DIAGNOSIS — R2681 Unsteadiness on feet: Secondary | ICD-10-CM | POA: Diagnosis not present

## 2024-06-02 DIAGNOSIS — M4856XA Collapsed vertebra, not elsewhere classified, lumbar region, initial encounter for fracture: Secondary | ICD-10-CM | POA: Diagnosis present

## 2024-06-02 DIAGNOSIS — Z91040 Latex allergy status: Secondary | ICD-10-CM

## 2024-06-02 DIAGNOSIS — D649 Anemia, unspecified: Secondary | ICD-10-CM

## 2024-06-02 DIAGNOSIS — R6520 Severe sepsis without septic shock: Secondary | ICD-10-CM | POA: Diagnosis present

## 2024-06-02 DIAGNOSIS — N179 Acute kidney failure, unspecified: Secondary | ICD-10-CM | POA: Diagnosis not present

## 2024-06-02 DIAGNOSIS — S0081XA Abrasion of other part of head, initial encounter: Secondary | ICD-10-CM | POA: Diagnosis present

## 2024-06-02 DIAGNOSIS — R41841 Cognitive communication deficit: Secondary | ICD-10-CM | POA: Diagnosis not present

## 2024-06-02 DIAGNOSIS — G9341 Metabolic encephalopathy: Secondary | ICD-10-CM | POA: Diagnosis present

## 2024-06-02 DIAGNOSIS — R609 Edema, unspecified: Secondary | ICD-10-CM | POA: Diagnosis not present

## 2024-06-02 DIAGNOSIS — I2510 Atherosclerotic heart disease of native coronary artery without angina pectoris: Secondary | ICD-10-CM | POA: Diagnosis not present

## 2024-06-02 DIAGNOSIS — S199XXA Unspecified injury of neck, initial encounter: Secondary | ICD-10-CM | POA: Diagnosis not present

## 2024-06-02 DIAGNOSIS — R748 Abnormal levels of other serum enzymes: Secondary | ICD-10-CM | POA: Diagnosis present

## 2024-06-02 DIAGNOSIS — E860 Dehydration: Secondary | ICD-10-CM | POA: Diagnosis present

## 2024-06-02 DIAGNOSIS — I700 Atherosclerosis of aorta: Secondary | ICD-10-CM | POA: Diagnosis not present

## 2024-06-02 DIAGNOSIS — K219 Gastro-esophageal reflux disease without esophagitis: Secondary | ICD-10-CM | POA: Diagnosis present

## 2024-06-02 DIAGNOSIS — Y92009 Unspecified place in unspecified non-institutional (private) residence as the place of occurrence of the external cause: Secondary | ICD-10-CM | POA: Diagnosis not present

## 2024-06-02 DIAGNOSIS — S3992XA Unspecified injury of lower back, initial encounter: Secondary | ICD-10-CM | POA: Diagnosis not present

## 2024-06-02 DIAGNOSIS — I6523 Occlusion and stenosis of bilateral carotid arteries: Secondary | ICD-10-CM | POA: Diagnosis not present

## 2024-06-02 DIAGNOSIS — Z7902 Long term (current) use of antithrombotics/antiplatelets: Secondary | ICD-10-CM

## 2024-06-02 DIAGNOSIS — Z043 Encounter for examination and observation following other accident: Secondary | ICD-10-CM | POA: Diagnosis not present

## 2024-06-02 DIAGNOSIS — R000 Tachycardia, unspecified: Secondary | ICD-10-CM | POA: Diagnosis not present

## 2024-06-02 DIAGNOSIS — R9431 Abnormal electrocardiogram [ECG] [EKG]: Secondary | ICD-10-CM | POA: Diagnosis not present

## 2024-06-02 DIAGNOSIS — S32020D Wedge compression fracture of second lumbar vertebra, subsequent encounter for fracture with routine healing: Secondary | ICD-10-CM | POA: Diagnosis not present

## 2024-06-02 DIAGNOSIS — M4854XA Collapsed vertebra, not elsewhere classified, thoracic region, initial encounter for fracture: Secondary | ICD-10-CM | POA: Diagnosis not present

## 2024-06-02 DIAGNOSIS — I4891 Unspecified atrial fibrillation: Secondary | ICD-10-CM | POA: Diagnosis not present

## 2024-06-02 DIAGNOSIS — A040 Enteropathogenic Escherichia coli infection: Secondary | ICD-10-CM | POA: Diagnosis present

## 2024-06-02 DIAGNOSIS — Z7982 Long term (current) use of aspirin: Secondary | ICD-10-CM

## 2024-06-02 DIAGNOSIS — R197 Diarrhea, unspecified: Secondary | ICD-10-CM

## 2024-06-02 DIAGNOSIS — I480 Paroxysmal atrial fibrillation: Secondary | ICD-10-CM | POA: Diagnosis present

## 2024-06-02 DIAGNOSIS — I340 Nonrheumatic mitral (valve) insufficiency: Secondary | ICD-10-CM | POA: Diagnosis present

## 2024-06-02 DIAGNOSIS — E876 Hypokalemia: Secondary | ICD-10-CM | POA: Diagnosis not present

## 2024-06-02 DIAGNOSIS — S80211A Abrasion, right knee, initial encounter: Secondary | ICD-10-CM | POA: Diagnosis present

## 2024-06-02 DIAGNOSIS — S3991XA Unspecified injury of abdomen, initial encounter: Secondary | ICD-10-CM | POA: Diagnosis not present

## 2024-06-02 DIAGNOSIS — R4182 Altered mental status, unspecified: Principal | ICD-10-CM | POA: Diagnosis present

## 2024-06-02 DIAGNOSIS — Z79890 Hormone replacement therapy (postmenopausal): Secondary | ICD-10-CM

## 2024-06-02 DIAGNOSIS — S299XXA Unspecified injury of thorax, initial encounter: Secondary | ICD-10-CM | POA: Diagnosis not present

## 2024-06-02 DIAGNOSIS — W1809XA Striking against other object with subsequent fall, initial encounter: Secondary | ICD-10-CM | POA: Diagnosis present

## 2024-06-02 DIAGNOSIS — G9340 Encephalopathy, unspecified: Secondary | ICD-10-CM | POA: Diagnosis not present

## 2024-06-02 DIAGNOSIS — Z23 Encounter for immunization: Secondary | ICD-10-CM

## 2024-06-02 DIAGNOSIS — E872 Acidosis, unspecified: Secondary | ICD-10-CM | POA: Diagnosis present

## 2024-06-02 DIAGNOSIS — M6282 Rhabdomyolysis: Secondary | ICD-10-CM | POA: Diagnosis present

## 2024-06-02 DIAGNOSIS — D72829 Elevated white blood cell count, unspecified: Secondary | ICD-10-CM

## 2024-06-02 DIAGNOSIS — M6281 Muscle weakness (generalized): Secondary | ICD-10-CM | POA: Diagnosis not present

## 2024-06-02 DIAGNOSIS — E039 Hypothyroidism, unspecified: Secondary | ICD-10-CM | POA: Diagnosis present

## 2024-06-02 DIAGNOSIS — Z7401 Bed confinement status: Secondary | ICD-10-CM | POA: Diagnosis not present

## 2024-06-02 DIAGNOSIS — Z638 Other specified problems related to primary support group: Secondary | ICD-10-CM

## 2024-06-02 DIAGNOSIS — Z9181 History of falling: Secondary | ICD-10-CM | POA: Diagnosis not present

## 2024-06-02 DIAGNOSIS — A4151 Sepsis due to Escherichia coli [E. coli]: Secondary | ICD-10-CM | POA: Diagnosis present

## 2024-06-02 DIAGNOSIS — R239 Unspecified skin changes: Secondary | ICD-10-CM | POA: Diagnosis not present

## 2024-06-02 DIAGNOSIS — S80212A Abrasion, left knee, initial encounter: Secondary | ICD-10-CM | POA: Diagnosis present

## 2024-06-02 DIAGNOSIS — S0990XA Unspecified injury of head, initial encounter: Secondary | ICD-10-CM | POA: Diagnosis not present

## 2024-06-02 DIAGNOSIS — Z79899 Other long term (current) drug therapy: Secondary | ICD-10-CM

## 2024-06-02 DIAGNOSIS — S32020A Wedge compression fracture of second lumbar vertebra, initial encounter for closed fracture: Secondary | ICD-10-CM | POA: Diagnosis not present

## 2024-06-02 DIAGNOSIS — Z951 Presence of aortocoronary bypass graft: Secondary | ICD-10-CM | POA: Diagnosis not present

## 2024-06-02 DIAGNOSIS — R531 Weakness: Secondary | ICD-10-CM | POA: Diagnosis not present

## 2024-06-02 DIAGNOSIS — J439 Emphysema, unspecified: Secondary | ICD-10-CM | POA: Diagnosis not present

## 2024-06-02 DIAGNOSIS — R54 Age-related physical debility: Secondary | ICD-10-CM | POA: Diagnosis present

## 2024-06-02 DIAGNOSIS — Z741 Need for assistance with personal care: Secondary | ICD-10-CM | POA: Diagnosis not present

## 2024-06-02 DIAGNOSIS — Z9049 Acquired absence of other specified parts of digestive tract: Secondary | ICD-10-CM

## 2024-06-02 DIAGNOSIS — Z823 Family history of stroke: Secondary | ICD-10-CM

## 2024-06-02 DIAGNOSIS — W19XXXA Unspecified fall, initial encounter: Secondary | ICD-10-CM | POA: Diagnosis not present

## 2024-06-02 DIAGNOSIS — R410 Disorientation, unspecified: Secondary | ICD-10-CM | POA: Diagnosis not present

## 2024-06-02 DIAGNOSIS — Z886 Allergy status to analgesic agent status: Secondary | ICD-10-CM

## 2024-06-02 LAB — CBC WITH DIFFERENTIAL/PLATELET
Abs Immature Granulocytes: 0.04 K/uL (ref 0.00–0.07)
Basophils Absolute: 0 K/uL (ref 0.0–0.1)
Basophils Relative: 0 %
Eosinophils Absolute: 0 K/uL (ref 0.0–0.5)
Eosinophils Relative: 0 %
HCT: 16.8 % — ABNORMAL LOW (ref 36.0–46.0)
Hemoglobin: 5 g/dL — CL (ref 12.0–15.0)
Immature Granulocytes: 1 %
Lymphocytes Relative: 5 %
Lymphs Abs: 0.4 K/uL — ABNORMAL LOW (ref 0.7–4.0)
MCH: 31.1 pg (ref 26.0–34.0)
MCHC: 29.8 g/dL — ABNORMAL LOW (ref 30.0–36.0)
MCV: 104.3 fL — ABNORMAL HIGH (ref 80.0–100.0)
Monocytes Absolute: 0.3 K/uL (ref 0.1–1.0)
Monocytes Relative: 4 %
Neutro Abs: 7.2 K/uL (ref 1.7–7.7)
Neutrophils Relative %: 90 %
Platelets: 148 K/uL — ABNORMAL LOW (ref 150–400)
RBC: 1.61 MIL/uL — ABNORMAL LOW (ref 3.87–5.11)
RDW: 18.4 % — ABNORMAL HIGH (ref 11.5–15.5)
WBC: 7.9 K/uL (ref 4.0–10.5)
nRBC: 0 % (ref 0.0–0.2)

## 2024-06-02 LAB — URINALYSIS, W/ REFLEX TO CULTURE (INFECTION SUSPECTED)
Glucose, UA: NEGATIVE mg/dL
Ketones, ur: 20 mg/dL — AB
Leukocytes,Ua: NEGATIVE
Nitrite: NEGATIVE
Protein, ur: >=300 mg/dL — AB
Specific Gravity, Urine: 1.027 (ref 1.005–1.030)
pH: 5 (ref 5.0–8.0)

## 2024-06-02 LAB — I-STAT VENOUS BLOOD GAS, ED
Acid-Base Excess: 4 mmol/L — ABNORMAL HIGH (ref 0.0–2.0)
Bicarbonate: 28.4 mmol/L — ABNORMAL HIGH (ref 20.0–28.0)
Calcium, Ion: 1.11 mmol/L — ABNORMAL LOW (ref 1.15–1.40)
HCT: 41 % (ref 36.0–46.0)
Hemoglobin: 13.9 g/dL (ref 12.0–15.0)
O2 Saturation: 74 %
Potassium: 3.9 mmol/L (ref 3.5–5.1)
Sodium: 144 mmol/L (ref 135–145)
TCO2: 30 mmol/L (ref 22–32)
pCO2, Ven: 42.1 mmHg — ABNORMAL LOW (ref 44–60)
pH, Ven: 7.438 — ABNORMAL HIGH (ref 7.25–7.43)
pO2, Ven: 38 mmHg (ref 32–45)

## 2024-06-02 LAB — COMPREHENSIVE METABOLIC PANEL WITH GFR
ALT: 75 U/L — ABNORMAL HIGH (ref 0–44)
AST: 86 U/L — ABNORMAL HIGH (ref 15–41)
Albumin: 3.1 g/dL — ABNORMAL LOW (ref 3.5–5.0)
Alkaline Phosphatase: 148 U/L — ABNORMAL HIGH (ref 38–126)
Anion gap: 20 — ABNORMAL HIGH (ref 5–15)
BUN: 26 mg/dL — ABNORMAL HIGH (ref 8–23)
CO2: 20 mmol/L — ABNORMAL LOW (ref 22–32)
Calcium: 9.1 mg/dL (ref 8.9–10.3)
Chloride: 103 mmol/L (ref 98–111)
Creatinine, Ser: 0.83 mg/dL (ref 0.44–1.00)
GFR, Estimated: >60 mL/min (ref >60)
Glucose, Bld: 99 mg/dL (ref 70–99)
Potassium: 3.9 mmol/L (ref 3.5–5.1)
Sodium: 143 mmol/L (ref 135–145)
Total Bilirubin: 0.9 mg/dL (ref 0.0–1.2)
Total Protein: 6.1 g/dL — ABNORMAL LOW (ref 6.5–8.1)

## 2024-06-02 LAB — I-STAT CHEM 8, ED
BUN: 37 mg/dL — ABNORMAL HIGH (ref 8–23)
Calcium, Ion: 1.09 mmol/L — ABNORMAL LOW (ref 1.15–1.40)
Chloride: 106 mmol/L (ref 98–111)
Creatinine, Ser: 0.6 mg/dL (ref 0.44–1.00)
Glucose, Bld: 106 mg/dL — ABNORMAL HIGH (ref 70–99)
HCT: 41 % (ref 36.0–46.0)
Hemoglobin: 13.9 g/dL (ref 12.0–15.0)
Potassium: 3.9 mmol/L (ref 3.5–5.1)
Sodium: 144 mmol/L (ref 135–145)
TCO2: 27 mmol/L (ref 22–32)

## 2024-06-02 LAB — TSH: TSH: 1.32 u[IU]/mL (ref 0.350–4.500)

## 2024-06-02 LAB — I-STAT CG4 LACTIC ACID, ED: Lactic Acid, Venous: 2.7 mmol/L (ref 0.5–1.9)

## 2024-06-02 LAB — CK: Total CK: 696 U/L — ABNORMAL HIGH (ref 38–234)

## 2024-06-02 LAB — TROPONIN I (HIGH SENSITIVITY): Troponin I (High Sensitivity): 51 ng/L — ABNORMAL HIGH (ref <18)

## 2024-06-02 LAB — MAGNESIUM: Magnesium: 0.6 mg/dL — CL (ref 1.7–2.4)

## 2024-06-02 LAB — LIPASE, BLOOD: Lipase: 16 U/L (ref 11–51)

## 2024-06-02 MED ORDER — IOHEXOL 350 MG/ML SOLN
75.0000 mL | Freq: Once | INTRAVENOUS | Status: AC | PRN
  Administered 2024-06-02: 75 mL via INTRAVENOUS

## 2024-06-02 MED ORDER — SODIUM CHLORIDE 0.9 % IV BOLUS: 1000.0000 mL | Freq: Once | INTRAVENOUS | Status: DC

## 2024-06-02 MED ORDER — LACTATED RINGERS IV BOLUS
1000.0000 mL | Freq: Once | INTRAVENOUS | Status: AC
Start: 2024-06-02 — End: 2024-06-02
  Administered 2024-06-02: 1000 mL via INTRAVENOUS

## 2024-06-02 MED ORDER — SODIUM CHLORIDE 0.9 % IV BOLUS
1000.0000 mL | Freq: Once | INTRAVENOUS | Status: AC
  Administered 2024-06-02: 1000 mL via INTRAVENOUS

## 2024-06-02 NOTE — ED Triage Notes (Addendum)
 Pt in as Lvl 2 fall on Plavix  from home (lives by self), found on the floor facedown by daughter who had to brwak into her house tonight. Pt's LSN was on Tuesday with friend [place]. Pt takes Plavix  s/p CABG a few months ago. House and clothes covered in urine/feces. Pt is GCS 13 on ED arrival, will not open eyes or follow commands.  VS w/EMS: 93%RA CBG 134 116/76 ETCO2 20

## 2024-06-02 NOTE — ED Provider Notes (Signed)
 [place] EMERGENCY DEPARTMENT AT [facility] Provider Note   CSN: [id number] Arrival date & time: 06/02/24  2105    Patient presents with: Fall on Plavix  and Code Sepsis   Kaitlyn Wright is a 78 y.o. female here for evaluation of altered mental status.  History obtained from EMS as patient is altered.  She was found facedown on the ground by [place]- family friend.  Apparently family is estranged and family had not talked to her for weeks.  She did talk with allegedly a friend on Tuesday however no one had heard from her since.  Covered in feces, surrounded by roaches per EMS.  Altered with EMS, spontaneously moves her extremities however would not follow commands, got combative with EMS requiring medication.  Apparently with EMS patient's floor, close in house covered in insects, urine, feces  Admitted for similar in July noted to have severe MR however declined any further cardiac workup, intermittently had hypotension requiring vasopressors.  SNF was recommended however patient elected to go back home.  Did not seem to have clear etiology of encephalopathy discharge.  She was discharged on midodrine .  Per prior outpatient notes patient had declined cardiac rehab after CABG over the summer as well as was intermittently compliant with her medications at home due to cost.  Patient made a level 2 fall on thinners as she is post be on Plavix .   HPI     Prior to Admission medications   Medication Sig Start Date End Date Taking? Authorizing Provider  acetaminophen  (TYLENOL ) 325 MG tablet Take 2 tablets (650 mg total) by mouth every 6 (six) hours as needed for mild pain (pain score 1-3). 04/16/24   Rosario Leatrice FERNS, MD  Ascorbic Acid (VITAMIN C) 100 MG tablet Take 100 mg by mouth daily. Patient not taking: Reported on 05/11/2024    [provider]  aspirin  EC 81 MG tablet Take 1 tablet (81 mg total) by mouth daily. Swallow whole. Patient not taking: Reported on 05/11/2024  02/15/24   Raguel Con RAMAN, PA-C  atorvastatin  (LIPITOR ) 80 MG tablet Take 1 tablet (80 mg total) by mouth daily at 6 PM. 03/28/24   Williams, Evan, PA-C  B Complex-C (B-COMPLEX WITH VITAMIN C) tablet Take 1 tablet by mouth daily.    [provider]  cholecalciferol  (VITAMIN D ) 1000 UNITS tablet Take 1,000 Units by mouth daily.    [provider]  Cinnamon 500 MG TABS Take 500 mg by mouth daily.    [provider]  clopidogrel  (PLAVIX ) 75 MG tablet Take 1 tablet (75 mg total) by mouth daily. 03/28/24   Trudy Birmingham, PA-C  Coenzyme Q10 (COQ-10) 100 MG CAPS Take 100 mg by mouth daily.    [provider]  cyclobenzaprine (FLEXERIL) 5 MG tablet 1 tablet at bedtime as needed Orally Once a day As needed    [provider]  escitalopram  (LEXAPRO ) 10 MG tablet Take 10 mg by mouth daily. 03/06/24   [provider]  feeding supplement (ENSURE PLUS HIGH PROTEIN) LIQD Take 237 mLs by mouth 2 (two) times daily between meals. 04/17/24 06/16/24  Rosario Leatrice I, MD  Flaxseed, Linseed, (FLAXSEED OIL) 1000 MG CAPS Take 1,000 mg by mouth daily. Patient not taking: Reported on 05/11/2024    [provider]  Garlic 100 MG TABS Take 100 mg by mouth daily.    [provider]  hydrOXYzine  (ATARAX ) 10 MG tablet 1 tablet Orally Once a day at bedtime; Duration: 30 days  04/23/24   [provider]  Anselm Oil 300 MG CAPS Take 300 mg by mouth daily.    [provider]  latanoprost  (XALATAN ) 0.005 % ophthalmic solution Place 1 drop into both eyes at bedtime. 09/16/23   [provider]  levothyroxine  (SYNTHROID ) 88 MCG tablet Take 1 tablet (88 mcg total) by mouth daily. 03/26/24   Trudy Birmingham, PA-C  metoprolol  succinate (TOPROL -XL) 25 MG 24 hr tablet Take 25 mg by mouth daily.    [provider]  ondansetron  (ZOFRAN ) 4 MG tablet Take 4 mg by mouth every 8 (eight) hours as needed for nausea or vomiting.    [provider]  pantoprazole  (PROTONIX ) 40 MG tablet Take 1 tablet (40 mg total) by mouth daily. 03/28/24   Williams, Evan, PA-C  polyethylene glycol powder (GLYCOLAX /MIRALAX ) 17 GM/SCOOP powder Take 17 g by mouth daily as needed for moderate constipation. 04/16/24   Rosario Leatrice FERNS, MD    Allergies: Naprosyn  [naproxen ] and Latex    Review of Systems  Unable to perform ROS: Mental status change    Updated Vital Signs BP 139/68   Pulse 84   Temp (!) 97.3 F (36.3 C) (Axillary)   Resp 18   Wt 49.9 kg   SpO2 99%   BMI 19.49 kg/m   Physical Exam Vitals and nursing note reviewed.  Constitutional:      Appearance: She is well-developed. She is ill-appearing.  HENT:     Head: Normocephalic and atraumatic.     Nose: Nose normal.     Mouth/Throat:     Mouth: Mucous membranes are dry.     Comments: Dry mucous membranes, tongue midline Eyes:     Pupils: Pupils are equal, round, and reactive to light.  Cardiovascular:     Rate and Rhythm: Normal rate.     Pulses: Normal pulses.     Heart sounds: Murmur heard.  Pulmonary:     Effort: Pulmonary effort is normal. No respiratory distress.     Breath sounds: Normal breath sounds.  Abdominal:     General: Bowel sounds are normal. There is no distension.     Palpations: Abdomen is soft.     Tenderness: There is no abdominal tenderness. There is no guarding or rebound.  Musculoskeletal:        General: Normal range of motion.     Cervical back: Normal range of motion.     Comments: Spontaneously moves extremities, bruising bilateral anterior knees with abrasion, bruising left lateral shoulder however appears old  Skin:    General: Skin is warm and dry.     Capillary Refill: Capillary refill takes 2 to 3 seconds.     Findings: Bruising present.  Neurological:     Mental Status: She is disoriented and confused.     GCS: GCS eye subscore is 2. GCS verbal subscore is 4. GCS motor subscore is 4.     Comments: No obvious facial droop,  withdraws to pain however does not answer questions, moans and yells no.  Squints eyes to pain, resists eye opening  Psychiatric:        Mood and Affect: Mood normal.    (all labs ordered are listed, but only abnormal results are displayed) Labs Reviewed  CBC WITH DIFFERENTIAL/PLATELET - Abnormal; Notable for the following components:      Result Value   RBC 1.61 (*)    Hemoglobin 5.0 (*)    HCT 16.8 (*)    MCV 104.3 (*)  MCHC 29.8 (*)    RDW 18.4 (*)    Platelets 148 (*)    Lymphs Abs 0.4 (*)    All other components within normal limits  COMPREHENSIVE METABOLIC PANEL WITH GFR - Abnormal; Notable for the following components:   CO2 20 (*)    BUN 26 (*)    Total Protein 6.1 (*)    Albumin  3.1 (*)    AST 86 (*)    ALT 75 (*)    Alkaline Phosphatase 148 (*)    Anion gap 20 (*)    All other components within normal limits  CK - Abnormal; Notable for the following components:   Total CK 696 (*)    All other components within normal limits  URINALYSIS, W/ REFLEX TO CULTURE (INFECTION SUSPECTED) - Abnormal; Notable for the following components:   Color, Urine AMBER (*)    APPearance CLOUDY (*)    Hgb urine dipstick LARGE (*)    Bilirubin Urine SMALL (*)    Ketones, ur 20 (*)    Protein, ur >=300 (*)    Bacteria, UA RARE (*)    All other components within normal limits  MAGNESIUM  - Abnormal; Notable for the following components:   Magnesium  0.6 (*)    All other components within normal limits  RAPID URINE DRUG SCREEN, HOSP PERFORMED - Abnormal; Notable for the following components:   Benzodiazepines POSITIVE (*)    All other components within normal limits  CBC WITH DIFFERENTIAL/PLATELET - Abnormal; Notable for the following components:   WBC 18.5 (*)    RDW 18.6 (*)    Neutro Abs 15.1 (*)    Monocytes Absolute 1.1 (*)    Eosinophils Absolute 1.1 (*)    Abs Immature Granulocytes 0.09 (*)    All other components within normal limits  I-STAT CHEM 8, ED - Abnormal;  Notable for the following components:   BUN 37 (*)    Glucose, Bld 106 (*)    Calcium , Ion 1.09 (*)    All other components within normal limits  I-STAT VENOUS BLOOD GAS, ED - Abnormal; Notable for the following components:   pH, Ven 7.438 (*)    pCO2, Ven 42.1 (*)    Bicarbonate 28.4 (*)    Acid-Base Excess 4.0 (*)    Calcium , Ion 1.11 (*)    All other components within normal limits  I-STAT CG4 LACTIC ACID, ED - Abnormal; Notable for the following components:   Lactic Acid, Venous 2.7 (*)    All other components within normal limits  TROPONIN I (HIGH SENSITIVITY) - Abnormal; Notable for the following components:   Troponin I (High Sensitivity) 51 (*)    All other components within normal limits  TROPONIN I (HIGH SENSITIVITY) - Abnormal; Notable for the following components:   Troponin I (High Sensitivity) 66 (*)    All other components within normal limits  CULTURE, BLOOD (ROUTINE X 2)  CULTURE, BLOOD (ROUTINE X 2)  LIPASE, BLOOD  TSH  AMMONIA  I-STAT CG4 LACTIC ACID, ED  TYPE AND SCREEN    EKG: None  Radiology: CT CHEST ABDOMEN PELVIS W CONTRAST Result Date: 06/02/2024 CLINICAL DATA:  Polytrauma, blunt fall on Plavix  from home (lives by self), found on the floor facedown by daughter who had to brwak into her house tonight EXAM: CT CHEST, ABDOMEN, AND PELVIS WITH CONTRAST TECHNIQUE: Multidetector CT imaging of the chest, abdomen and pelvis was performed following the standard protocol during bolus administration of intravenous contrast. RADIATION DOSE REDUCTION: This  exam was performed according to the departmental dose-optimization program which includes automated exposure control, adjustment of the mA and/or kV according to patient size and/or use of iterative reconstruction technique. CONTRAST:  75mL OMNIPAQUE  IOHEXOL  350 MG/ML SOLN COMPARISON:  None Available. FINDINGS: CHEST: Cardiovascular: No aortic injury. The thoracic aorta is normal in caliber. The heart is normal in  size. No significant pericardial effusion. Severe atherosclerotic plaque. Coronary artery calcifications status post coronary artery bypass graft. The main pulmonary artery is normal in caliber. No central or proximal segmental pulmonary embolus. Limited evaluation more distally due to timing of contrast. Mediastinum/Nodes: No pneumomediastinum. No mediastinal hematoma. The esophagus is unremarkable. The thyroid  is unremarkable. The central airways are patent. No mediastinal, hilar, or axillary lymphadenopathy. Lungs/Pleura: No focal consolidation. No pulmonary nodule. No pulmonary mass. No pulmonary contusion or laceration. No pneumatocele formation. No pleural effusion. No pneumothorax. No hemothorax. Musculoskeletal/Chest wall: No chest wall mass. No acute rib or sternal fracture. Please see separately dictated CT thoracolumbar spine. ABDOMEN / PELVIS: Hepatobiliary: Not enlarged. Focal fatty infiltration along falciform ligament. No focal lesion. No laceration or subcapsular hematoma. Status post cholecystectomy.  No biliary ductal dilatation. Pancreas: Normal pancreatic contour. No main pancreatic duct dilatation. Spleen: Not enlarged. Scattered calcifications consistent with sequelae of prior granulomatous disease. No focal lesion. No laceration, subcapsular hematoma, or vascular injury. Adrenals/Urinary Tract: No nodularity bilaterally. Bilateral kidneys enhance symmetrically. No hydronephrosis. No contusion, laceration, or subcapsular hematoma. No injury to the vascular structures or collecting systems. No hydroureter. The urinary bladder is unremarkable. Stomach/Bowel: No small or large bowel wall thickening or dilatation. Stool throughout the majority of the colon. Colonic diverticulosis. The appendix is unremarkable. Vasculature/Lymphatics: Severe atherosclerotic plaque. No abdominal aorta or iliac aneurysm. No active contrast extravasation or pseudoaneurysm. No abdominal, pelvic, inguinal  lymphadenopathy. Reproductive: Uterus is unremarkable.  No adnexal mass. Other: No simple free fluid ascites. No pneumoperitoneum. No hemoperitoneum. No mesenteric hematoma identified. No organized fluid collection. Musculoskeletal: No significant soft tissue hematoma. No acute pelvic fracture. Please see separately dictated CT thoracolumbar spine. Other ports and devices: None. IMPRESSION: 1. No acute intrathoracic, intra-abdominal, intrapelvic traumatic injury. 2. Please see separately dictated CT thoracolumbar spine. 3. Aortic Atherosclerosis (ICD10-I70.0) and Emphysema (ICD10-J43.9). Electronically Signed   By: Morgane  Naveau M.D.   On: 06/02/2024 23:46   CT L-SPINE NO CHARGE Result Date: 06/02/2024 CLINICAL DATA:  Neck trauma, midline tenderness (Age 41-64y) fall on Plavix  from home (lives by self), found on the floor facedown by daughter who had to brwak into her house tonight EXAM: CT CERVICAL, THORACIC, AND LUMBAR SPINE WITHOUT CONTRAST TECHNIQUE: Multidetector CT imaging of the cervical, thoracic and lumbar spine was performed without intravenous contrast. Multiplanar CT image reconstructions were also generated. RADIATION DOSE REDUCTION: This exam was performed according to the departmental dose-optimization program which includes automated exposure control, adjustment of the mA and/or kV according to patient size and/or use of iterative reconstruction technique. COMPARISON:  MRI cervical spine 04/10/2024. CT abdomen pelvis 03/14/2024 FINDINGS: CT CERVICAL SPINE FINDINGS Alignment: Normal. Skull base and vertebrae: Diffusely decreased bone density. Multilevel mild moderate degenerative changes of the spine. No associated severe osseous neural foraminal or central canal stenosis. No acute fracture. No aggressive appearing focal osseous lesion or focal pathologic process. Soft tissues and spinal canal: No prevertebral fluid or swelling. No visible canal hematoma. Upper chest: Biapical pleural/pulmonary  scarring. Centrilobular emphysematous changes. Other: None. CT THORACIC SPINE FINDINGS Alignment: Normal. Vertebrae: Diffusely decreased bone density. Chronic stable T11 compression fracture. No acute  fracture or focal pathologic process. Paraspinal and other soft tissues: Negative. Disc levels: Maintained. CT LUMBAR SPINE FINDINGS Segmentation: 5 lumbar type vertebrae. Alignment: Normal. Vertebrae: Diffusely decreased bone density. Multilevel moderate degenerative changes of the spine. Acute to subacute L2 superior endplate compression fracture. Focal pathologic process. Paraspinal and other soft tissues: Negative. Disc levels: Intervertebral disc space narrowing at the L1-L2 level. Intervertebral disc space vacuum phenomenon at the L4-L5 L5-S1 levels. IMPRESSION: 1.  Acute to subacute L2 superior endplate compression fracture. 2. No acute displaced fracture or traumatic listhesis of the cervical and thoracic spine. Chronic stable T11 compression fracture. 3. Diffusely decreased bone density. Electronically Signed   By: Morgane  Naveau M.D.   On: 06/02/2024 23:38   CT Cervical Spine Wo Contrast Result Date: 06/02/2024 CLINICAL DATA:  Neck trauma, midline tenderness (Age [age]y) fall on Plavix  from home (lives by self), found on the floor facedown by daughter who had to brwak into her house tonight EXAM: CT CERVICAL, THORACIC, AND LUMBAR SPINE WITHOUT CONTRAST TECHNIQUE: Multidetector CT imaging of the cervical, thoracic and lumbar spine was performed without intravenous contrast. Multiplanar CT image reconstructions were also generated. RADIATION DOSE REDUCTION: This exam was performed according to the departmental dose-optimization program which includes automated exposure control, adjustment of the mA and/or kV according to patient size and/or use of iterative reconstruction technique. COMPARISON:  MRI cervical spine 04/10/2024. CT abdomen pelvis 03/14/2024 FINDINGS: CT CERVICAL SPINE FINDINGS Alignment:  Normal. Skull base and vertebrae: Diffusely decreased bone density. Multilevel mild moderate degenerative changes of the spine. No associated severe osseous neural foraminal or central canal stenosis. No acute fracture. No aggressive appearing focal osseous lesion or focal pathologic process. Soft tissues and spinal canal: No prevertebral fluid or swelling. No visible canal hematoma. Upper chest: Biapical pleural/pulmonary scarring. Centrilobular emphysematous changes. Other: None. CT THORACIC SPINE FINDINGS Alignment: Normal. Vertebrae: Diffusely decreased bone density. Chronic stable T11 compression fracture. No acute fracture or focal pathologic process. Paraspinal and other soft tissues: Negative. Disc levels: Maintained. CT LUMBAR SPINE FINDINGS Segmentation: 5 lumbar type vertebrae. Alignment: Normal. Vertebrae: Diffusely decreased bone density. Multilevel moderate degenerative changes of the spine. Acute to subacute L2 superior endplate compression fracture. Focal pathologic process. Paraspinal and other soft tissues: Negative. Disc levels: Intervertebral disc space narrowing at the L1-L2 level. Intervertebral disc space vacuum phenomenon at the L4-L5 L5-S1 levels. IMPRESSION: 1.  Acute to subacute L2 superior endplate compression fracture. 2. No acute displaced fracture or traumatic listhesis of the cervical and thoracic spine. Chronic stable T11 compression fracture. 3. Diffusely decreased bone density. Electronically Signed   By: Morgane  Naveau M.D.   On: 06/02/2024 23:38   CT T-SPINE NO CHARGE Result Date: 06/02/2024 CLINICAL DATA:  Neck trauma, midline tenderness (Age [age]y) fall on Plavix  from home (lives by self), found on the floor facedown by daughter who had to brwak into her house tonight EXAM: CT CERVICAL, THORACIC, AND LUMBAR SPINE WITHOUT CONTRAST TECHNIQUE: Multidetector CT imaging of the cervical, thoracic and lumbar spine was performed without intravenous contrast. Multiplanar CT image  reconstructions were also generated. RADIATION DOSE REDUCTION: This exam was performed according to the departmental dose-optimization program which includes automated exposure control, adjustment of the mA and/or kV according to patient size and/or use of iterative reconstruction technique. COMPARISON:  MRI cervical spine 04/10/2024. CT abdomen pelvis 03/14/2024 FINDINGS: CT CERVICAL SPINE FINDINGS Alignment: Normal. Skull base and vertebrae: Diffusely decreased bone density. Multilevel mild moderate degenerative changes of the spine. No associated severe osseous neural foraminal  or central canal stenosis. No acute fracture. No aggressive appearing focal osseous lesion or focal pathologic process. Soft tissues and spinal canal: No prevertebral fluid or swelling. No visible canal hematoma. Upper chest: Biapical pleural/pulmonary scarring. Centrilobular emphysematous changes. Other: None. CT THORACIC SPINE FINDINGS Alignment: Normal. Vertebrae: Diffusely decreased bone density. Chronic stable T11 compression fracture. No acute fracture or focal pathologic process. Paraspinal and other soft tissues: Negative. Disc levels: Maintained. CT LUMBAR SPINE FINDINGS Segmentation: 5 lumbar type vertebrae. Alignment: Normal. Vertebrae: Diffusely decreased bone density. Multilevel moderate degenerative changes of the spine. Acute to subacute L2 superior endplate compression fracture. Focal pathologic process. Paraspinal and other soft tissues: Negative. Disc levels: Intervertebral disc space narrowing at the L1-L2 level. Intervertebral disc space vacuum phenomenon at the L4-L5 L5-S1 levels. IMPRESSION: 1.  Acute to subacute L2 superior endplate compression fracture. 2. No acute displaced fracture or traumatic listhesis of the cervical and thoracic spine. Chronic stable T11 compression fracture. 3. Diffusely decreased bone density. Electronically Signed   By: Morgane  Naveau M.D.   On: 06/02/2024 23:38   CT Head Wo  Contrast Result Date: 06/02/2024 CLINICAL DATA:  Head trauma, moderate-severe EXAM: CT HEAD WITHOUT CONTRAST TECHNIQUE: Contiguous axial images were obtained from the base of the skull through the vertex without intravenous contrast. RADIATION DOSE REDUCTION: This exam was performed according to the departmental dose-optimization program which includes automated exposure control, adjustment of the mA and/or kV according to patient size and/or use of iterative reconstruction technique. COMPARISON:  None Available. FINDINGS: Brain: Patchy and confluent areas of decreased attenuation are noted throughout the deep and periventricular white matter of the cerebral hemispheres bilaterally, compatible with chronic microvascular ischemic disease. No evidence of large-territorial acute infarction. No parenchymal hemorrhage. No mass lesion. No extra-axial collection. No mass effect or midline shift. No hydrocephalus. Basilar cisterns are patent. Vascular: No hyperdense vessel. Atherosclerotic calcifications are present within the cavernous internal carotid arteries. Skull: No acute fracture or focal lesion. Sinuses/Orbits: Paranasal sinuses and mastoid air cells are clear. The orbits are unremarkable. Other: None. IMPRESSION: No acute intracranial abnormality. Electronically Signed   By: Morgane  Naveau M.D.   On: 06/02/2024 23:30   DG Shoulder Left Result Date: 06/02/2024 CLINICAL DATA:  fall EXAM: LEFT SHOULDER - 2+ VIEW COMPARISON:  None Available. FINDINGS: There is no evidence of fracture or dislocation. There is no evidence of arthropathy or other focal bone abnormality. Soft tissues are unremarkable. IMPRESSION: Negative. Electronically Signed   By: Morgane  Naveau M.D.   On: 06/02/2024 21:58   DG Pelvis 1-2 Views Result Date: 06/02/2024 CLINICAL DATA:  fall EXAM: PELVIS - 1-2 VIEW COMPARISON:  X-ray pelvis 02/02/2023 FINDINGS: Limited evaluation due to overlapping osseous structures and overlying soft tissues.  There is no evidence of pelvic fracture or diastasis. No acute displaced fracture or dislocation of either hips. No pelvic bone lesions are seen. IMPRESSION: Negative for acute traumatic injury. Electronically Signed   By: Morgane  Naveau M.D.   On: 06/02/2024 21:51   DG Knee 1-2 Views Right Result Date: 06/02/2024 CLINICAL DATA:  [id number] Fall [id number] EXAM: RIGHT KNEE - 1-2 VIEW COMPARISON:  None Available. FINDINGS: No evidence of fracture, dislocation, or joint effusion. Mild to moderate medial tibiofemoral joint degenerative changes. Soft tissues are unremarkable. Vascular clips overlie the medial proximal leg. IMPRESSION: No acute displaced fracture or dislocation. Electronically Signed   By: Morgane  Naveau M.D.   On: 06/02/2024 21:50   DG Knee 1-2 Views Left Result Date: 06/02/2024 CLINICAL DATA:  [id number] Fall [id number] EXAM: LEFT KNEE - 1-2 VIEW COMPARISON:  None Available. FINDINGS: No evidence of fracture, dislocation, or joint effusion. No evidence of arthropathy or other focal bone abnormality. Soft tissues are unremarkable. Likely phlebolith posterior to the knee joint. IMPRESSION: No acute displaced fracture or dislocation. Electronically Signed   By: Morgane  Naveau M.D.   On: 06/02/2024 21:50   DG Chest Portable 1 View Result Date: 06/02/2024 CLINICAL DATA:  fall EXAM: PORTABLE CHEST 1 VIEW COMPARISON:  Chest x-ray 04/06/2024 FINDINGS: The heart and mediastinal contours are unchanged. Atherosclerotic plaque. No focal consolidation. No pulmonary edema. No pleural effusion. No pneumothorax. No acute osseous abnormality.  Sternotomy wires are intact. IMPRESSION: 1. No active disease. 2.  Aortic Atherosclerosis (ICD10-I70.0). Electronically Signed   By: Morgane  Naveau M.D.   On: 06/02/2024 21:49     .Critical Care  Performed by: Edie Rosebud LABOR, PA-C Authorized by: Edie Rosebud LABOR, PA-C   Critical care provider statement:    Critical care time (minutes):  35   Critical care was  necessary to treat or prevent imminent or life-threatening deterioration of the following conditions:  Sepsis   Critical care was time spent personally by me on the following activities:  Development of treatment plan with patient or surrogate, discussions with consultants, evaluation of patient's response to treatment, examination of patient, ordering and review of laboratory studies, ordering and review of radiographic studies, ordering and performing treatments and interventions, pulse oximetry, re-evaluation of patient's condition and review of old charts .Ortho Injury Treatment  Date/Time: 06/03/2024 12:28 AM  Performed by: Edie Rosebud A, PA-C Authorized by: Edie Rosebud LABOR, PA-C   Consent:    Consent obtained:  Verbal   Consent given by:  Patient   Risks discussed:  Nerve damage, restricted joint movement, vascular damage, fracture, irreducible dislocation, recurrent dislocation and stiffness   Alternatives discussed:  No treatment, alternative treatment, immobilization, referral and delayed treatmentInjury location: spine Location details L2 Injury type: fracture Fracture type: vertebral body Pre-procedure neurovascular assessment: neurovascularly intact Pre-procedure distal perfusion: normal Pre-procedure neurological function: normal Pre-procedure range of motion: normal  Anesthesia: Local anesthesia used: no  Patient sedated: NoManipulation performed: no Immobilization: brace Splint type: LSO brace.      Medications Ordered in the ED  ceFEPIme  (MAXIPIME ) 2 g in sodium chloride  0.9 % 100 mL IVPB (2 g Intravenous New Bag/Given 06/03/24 0020)  metroNIDAZOLE  (FLAGYL ) IVPB 500 mg (has no administration in time range)  vancomycin  (VANCOCIN ) IVPB 1000 mg/200 mL premix (has no administration in time range)  magnesium  sulfate IVPB 2 g 50 mL (2 g Intravenous New Bag/Given 06/03/24 0021)  lactated ringers  bolus 1,000 mL (0 mLs Intravenous Stopped 06/02/24 2354)  iohexol   (OMNIPAQUE ) 350 MG/ML injection 75 mL (75 mLs Intravenous Contrast Given 06/02/24 2312)  sodium chloride  0.9 % bolus 1,000 mL (1,000 mLs Intravenous New Bag/Given 06/02/24 369)   78 year old here for evaluation of altered mental status.  Found down by family friend.  Last spoke with someone on Tuesday.  She had similar admission in July with unknown cause of encephalopathy she did become hypotensive during her prior admission requiring vasopressors subsequently discharged on midodrine .  She was noted to have severe MR however declined further cardiac workup at that point.  Patient found by EMS covered in feces.  Allegedly insects crawling all over the house, as well as on patient.  Patient intermittently combative with EMS prior to arrival.  Here she is afebrile without tachycardia, tachypnea or hypoxia.  She  is altered.  She resists opening her eyes however will squint to pain.  Withdraws to pain.  Spontaneously moves all 4 extremities.  She was made a level 2 fall on thinners by EMS due to known history of Plavix .  GCS 10.  Has some various bruises to bilateral knees, left shoulder  Labs and imaging personally viewed interpreted.  CBC noted to have hemoglobin of 5.0 however according to staff blood was clotted, may be abnormal-straight stick i-STAT Chem-8 with hemoglobin 13.9--plan on recollect will also typed Metabolic panel elevated LFTs, anion gap 20 Mag 0.6 Lactic 2.7 Troponin 51 CK 696 UA In-N-Out Foley without infection TSH 1.32 X-ray imaging without significant findings  Reassessed.  Significant proved mentation however still altered.  States the year is 2003.  She states she trips and falls all the time.  She is adamant that she just tripped this afternoon however given her significantly dry mucous membranes, covered in dried feces I suspect she was on the ground for significantly longer.  Will admit for AMS.  Repeat CBC with leukocytosis 18.5, normal hemoglobin--given leukocytosis,  lactic acid, altered mental status will give broad-spectrum antibiotics for sepsis.  CBC shows normal hemoglobin.  Added mag for hypomagnesemia.  Discussed with Dr. Alfornia with medicine who is agreeable to evaluate patient for admission.  The patient appears reasonably stabilized for admission considering the current resources, flow, and capabilities available in the ED at this time, and I doubt any other [facility] requiring further screening and/or treatment in the ED prior to admission.                                    Medical Decision Making Amount and/or Complexity of Data Reviewed Independent Historian: EMS External Data Reviewed: labs, radiology, ECG and notes. Labs: ordered. Decision-making details documented in ED Course. Radiology: ordered and independent interpretation performed. Decision-making details documented in ED Course. ECG/medicine tests: ordered and independent interpretation performed. Decision-making details documented in ED Course.  Risk OTC drugs. Prescription drug management. Parenteral controlled substances. Decision regarding hospitalization. Diagnosis or treatment significantly limited by social determinants of health.       Final diagnoses:  Altered mental status, unspecified altered mental status type  Hypomagnesemia  Elevated CK  Closed compression fracture of L2 lumbar vertebra, initial encounter [facility])    ED Discharge Orders     None          Sierrah Luevano A, PA-C 06/03/24 0040    Elnor Bernarda SQUIBB, DO 06/11/24 [phone]

## 2024-06-03 ENCOUNTER — Encounter (HOSPITAL_COMMUNITY): Payer: Self-pay | Admitting: Internal Medicine

## 2024-06-03 DIAGNOSIS — R4182 Altered mental status, unspecified: Secondary | ICD-10-CM | POA: Diagnosis present

## 2024-06-03 DIAGNOSIS — E872 Acidosis, unspecified: Secondary | ICD-10-CM | POA: Diagnosis present

## 2024-06-03 DIAGNOSIS — E039 Hypothyroidism, unspecified: Secondary | ICD-10-CM | POA: Diagnosis present

## 2024-06-03 DIAGNOSIS — G9341 Metabolic encephalopathy: Secondary | ICD-10-CM

## 2024-06-03 DIAGNOSIS — M4856XA Collapsed vertebra, not elsewhere classified, lumbar region, initial encounter for fracture: Secondary | ICD-10-CM | POA: Diagnosis present

## 2024-06-03 DIAGNOSIS — S80212A Abrasion, left knee, initial encounter: Secondary | ICD-10-CM | POA: Diagnosis present

## 2024-06-03 DIAGNOSIS — I480 Paroxysmal atrial fibrillation: Secondary | ICD-10-CM | POA: Diagnosis present

## 2024-06-03 DIAGNOSIS — M6282 Rhabdomyolysis: Secondary | ICD-10-CM | POA: Diagnosis present

## 2024-06-03 DIAGNOSIS — I340 Nonrheumatic mitral (valve) insufficiency: Secondary | ICD-10-CM | POA: Diagnosis present

## 2024-06-03 DIAGNOSIS — S80211A Abrasion, right knee, initial encounter: Secondary | ICD-10-CM | POA: Diagnosis present

## 2024-06-03 DIAGNOSIS — A040 Enteropathogenic Escherichia coli infection: Secondary | ICD-10-CM | POA: Diagnosis present

## 2024-06-03 DIAGNOSIS — N179 Acute kidney failure, unspecified: Secondary | ICD-10-CM | POA: Diagnosis not present

## 2024-06-03 DIAGNOSIS — D72829 Elevated white blood cell count, unspecified: Secondary | ICD-10-CM

## 2024-06-03 DIAGNOSIS — A4151 Sepsis due to Escherichia coli [E. coli]: Secondary | ICD-10-CM | POA: Diagnosis present

## 2024-06-03 DIAGNOSIS — W1809XA Striking against other object with subsequent fall, initial encounter: Secondary | ICD-10-CM | POA: Diagnosis present

## 2024-06-03 DIAGNOSIS — Z951 Presence of aortocoronary bypass graft: Secondary | ICD-10-CM | POA: Diagnosis not present

## 2024-06-03 DIAGNOSIS — I2510 Atherosclerotic heart disease of native coronary artery without angina pectoris: Secondary | ICD-10-CM | POA: Diagnosis present

## 2024-06-03 DIAGNOSIS — Z79890 Hormone replacement therapy (postmenopausal): Secondary | ICD-10-CM | POA: Diagnosis not present

## 2024-06-03 DIAGNOSIS — S0081XA Abrasion of other part of head, initial encounter: Secondary | ICD-10-CM | POA: Diagnosis present

## 2024-06-03 DIAGNOSIS — E860 Dehydration: Secondary | ICD-10-CM | POA: Diagnosis present

## 2024-06-03 DIAGNOSIS — E876 Hypokalemia: Secondary | ICD-10-CM | POA: Diagnosis not present

## 2024-06-03 DIAGNOSIS — Y92009 Unspecified place in unspecified non-institutional (private) residence as the place of occurrence of the external cause: Secondary | ICD-10-CM | POA: Diagnosis not present

## 2024-06-03 DIAGNOSIS — R197 Diarrhea, unspecified: Secondary | ICD-10-CM

## 2024-06-03 DIAGNOSIS — Z23 Encounter for immunization: Secondary | ICD-10-CM | POA: Diagnosis present

## 2024-06-03 DIAGNOSIS — K219 Gastro-esophageal reflux disease without esophagitis: Secondary | ICD-10-CM | POA: Diagnosis present

## 2024-06-03 DIAGNOSIS — R6520 Severe sepsis without septic shock: Secondary | ICD-10-CM | POA: Diagnosis present

## 2024-06-03 LAB — CBC WITH DIFFERENTIAL/PLATELET
Abs Immature Granulocytes: 0.09 K/uL — ABNORMAL HIGH (ref 0.00–0.07)
Basophils Absolute: 0.1 K/uL (ref 0.0–0.1)
Basophils Relative: 0 %
Eosinophils Absolute: 1.1 K/uL — ABNORMAL HIGH (ref 0.0–0.5)
Eosinophils Relative: 6 %
HCT: 38.1 % (ref 36.0–46.0)
Hemoglobin: 12.5 g/dL (ref 12.0–15.0)
Immature Granulocytes: 1 %
Lymphocytes Relative: 6 %
Lymphs Abs: 1.1 K/uL (ref 0.7–4.0)
MCH: 31 pg (ref 26.0–34.0)
MCHC: 32.8 g/dL (ref 30.0–36.0)
MCV: 94.5 fL (ref 80.0–100.0)
Monocytes Absolute: 1.1 K/uL — ABNORMAL HIGH (ref 0.1–1.0)
Monocytes Relative: 6 %
Neutro Abs: 15.1 K/uL — ABNORMAL HIGH (ref 1.7–7.7)
Neutrophils Relative %: 81 %
Platelets: 334 K/uL (ref 150–400)
RBC: 4.03 MIL/uL (ref 3.87–5.11)
RDW: 18.6 % — ABNORMAL HIGH (ref 11.5–15.5)
WBC: 18.5 K/uL — ABNORMAL HIGH (ref 4.0–10.5)
nRBC: 0 % (ref 0.0–0.2)

## 2024-06-03 LAB — BLOOD CULTURE ID PANEL (REFLEXED) - BCID2

## 2024-06-03 LAB — COMPREHENSIVE METABOLIC PANEL WITH GFR
ALT: UNDETERMINED U/L (ref 0–44)
ALT: 166 U/L — ABNORMAL HIGH (ref 0–44)
AST: UNDETERMINED U/L (ref 15–41)
AST: 191 U/L — ABNORMAL HIGH (ref 15–41)
Albumin: UNDETERMINED g/dL (ref 3.5–5.0)
Albumin: 2 g/dL — ABNORMAL LOW (ref 3.5–5.0)
Alkaline Phosphatase: UNDETERMINED U/L (ref 38–126)
Alkaline Phosphatase: 83 U/L (ref 38–126)
Anion gap: UNDETERMINED (ref 5–15)
Anion gap: 9 (ref 5–15)
BUN: UNDETERMINED mg/dL (ref 8–23)
BUN: 18 mg/dL (ref 8–23)
CO2: UNDETERMINED mmol/L (ref 22–32)
CO2: 20 mmol/L — ABNORMAL LOW (ref 22–32)
Calcium: UNDETERMINED mg/dL (ref 8.9–10.3)
Calcium: 6.7 mg/dL — ABNORMAL LOW (ref 8.9–10.3)
Chloride: UNDETERMINED mmol/L (ref 98–111)
Chloride: 113 mmol/L — ABNORMAL HIGH (ref 98–111)
Creatinine, Ser: UNDETERMINED mg/dL (ref 0.44–1.00)
Creatinine, Ser: 0.6 mg/dL (ref 0.44–1.00)
GFR calc Af Amer: UNDETERMINED mL/min (ref >60)
GFR, Estimated: UNDETERMINED mL/min (ref >60)
GFR, Estimated: >60 mL/min (ref >60)
Glucose, Bld: UNDETERMINED mg/dL (ref 70–99)
Glucose, Bld: 96 mg/dL (ref 70–99)
Potassium: UNDETERMINED mmol/L (ref 3.5–5.1)
Potassium: 2.3 mmol/L — CL (ref 3.5–5.1)
Sodium: UNDETERMINED mmol/L (ref 135–145)
Sodium: 142 mmol/L (ref 135–145)
Total Bilirubin: UNDETERMINED mg/dL (ref 0.0–1.2)
Total Bilirubin: 1.5 mg/dL — ABNORMAL HIGH (ref 0.0–1.2)
Total Protein: UNDETERMINED g/dL (ref 6.5–8.1)
Total Protein: 4.2 g/dL — ABNORMAL LOW (ref 6.5–8.1)

## 2024-06-03 LAB — BASIC METABOLIC PANEL WITH GFR
Anion gap: 6 (ref 5–15)
BUN: 17 mg/dL (ref 8–23)
CO2: 23 mmol/L (ref 22–32)
Calcium: 8.3 mg/dL — ABNORMAL LOW (ref 8.9–10.3)
Chloride: 106 mmol/L (ref 98–111)
Creatinine, Ser: 0.78 mg/dL (ref 0.44–1.00)
GFR, Estimated: >60 mL/min (ref >60)
Glucose, Bld: 91 mg/dL (ref 70–99)
Potassium: 4.5 mmol/L (ref 3.5–5.1)
Sodium: 135 mmol/L (ref 135–145)

## 2024-06-03 LAB — GASTROINTESTINAL PANEL BY PCR, STOOL (REPLACES STOOL CULTURE)

## 2024-06-03 LAB — C DIFFICILE QUICK SCREEN W PCR REFLEX
C Diff antigen: NEGATIVE
C Diff interpretation: NOT DETECTED
C Diff toxin: NEGATIVE

## 2024-06-03 LAB — TROPONIN I (HIGH SENSITIVITY)
Troponin I (High Sensitivity): 63 ng/L — ABNORMAL HIGH (ref <18)
Troponin I (High Sensitivity): 66 ng/L — ABNORMAL HIGH (ref <18)

## 2024-06-03 LAB — MAGNESIUM
Magnesium: 2.9 mg/dL — ABNORMAL HIGH (ref 1.7–2.4)
Magnesium: 2.8 mg/dL — ABNORMAL HIGH (ref 1.7–2.4)

## 2024-06-03 LAB — CBC
HCT: 35.7 % — ABNORMAL LOW (ref 36.0–46.0)
Hemoglobin: 11.4 g/dL — ABNORMAL LOW (ref 12.0–15.0)
MCH: 30.8 pg (ref 26.0–34.0)
MCHC: 31.9 g/dL (ref 30.0–36.0)
MCV: 96.5 fL (ref 80.0–100.0)
Platelets: 327 K/uL (ref 150–400)
RBC: 3.7 MIL/uL — ABNORMAL LOW (ref 3.87–5.11)
RDW: 18.5 % — ABNORMAL HIGH (ref 11.5–15.5)
WBC: 18.6 K/uL — ABNORMAL HIGH (ref 4.0–10.5)
nRBC: 0 % (ref 0.0–0.2)

## 2024-06-03 LAB — AMMONIA: Ammonia: 18 umol/L (ref 9–35)

## 2024-06-03 LAB — CK: Total CK: 2721 U/L — ABNORMAL HIGH (ref 38–234)

## 2024-06-03 LAB — PROCALCITONIN: Procalcitonin: 0.11 ng/mL

## 2024-06-03 LAB — PHOSPHORUS: Phosphorus: 1.4 mg/dL — ABNORMAL LOW (ref 2.5–4.6)

## 2024-06-03 LAB — I-STAT CG4 LACTIC ACID, ED: Lactic Acid, Venous: 1 mmol/L (ref 0.5–1.9)

## 2024-06-03 LAB — RAPID URINE DRUG SCREEN, HOSP PERFORMED
Amphetamines: NOT DETECTED
Barbiturates: NOT DETECTED
Benzodiazepines: POSITIVE — AB
Cocaine: NOT DETECTED
Opiates: NOT DETECTED
Tetrahydrocannabinol: NOT DETECTED

## 2024-06-03 LAB — TYPE AND SCREEN

## 2024-06-03 MED ORDER — SODIUM CHLORIDE 0.9 % IV SOLN: INTRAVENOUS | Status: DC

## 2024-06-03 MED ORDER — ENOXAPARIN SODIUM 40 MG/0.4ML IJ SOSY
40.0000 mg | PREFILLED_SYRINGE | INTRAMUSCULAR | Status: DC
  Administered 2024-06-04 – 2024-06-11 (×8): 40 mg via SUBCUTANEOUS
  Filled 2024-06-03 (×8): qty 0.4

## 2024-06-03 MED ORDER — POTASSIUM CHLORIDE CRYS ER 20 MEQ PO TBCR
40.0000 meq | EXTENDED_RELEASE_TABLET | Freq: Once | ORAL | Status: AC
  Administered 2024-06-03: 40 meq via ORAL
  Filled 2024-06-03: qty 2

## 2024-06-03 MED ORDER — METOPROLOL SUCCINATE ER 25 MG PO TB24
25.0000 mg | ORAL_TABLET | Freq: Every day | ORAL | Status: DC
Start: 2024-06-03 — End: 2024-06-04
  Administered 2024-06-03: 25 mg via ORAL
  Filled 2024-06-03: qty 1

## 2024-06-03 MED ORDER — MAGNESIUM SULFATE 2 GM/50ML IV SOLN
2.0000 g | Freq: Once | INTRAVENOUS | Status: AC
  Administered 2024-06-03: 2 g via INTRAVENOUS
  Filled 2024-06-03: qty 50

## 2024-06-03 MED ORDER — ASPIRIN 81 MG PO TBEC: 81.0000 mg | DELAYED_RELEASE_TABLET | Freq: Every day | ORAL | Status: DC

## 2024-06-03 MED ORDER — PANTOPRAZOLE SODIUM 40 MG PO TBEC
40.0000 mg | DELAYED_RELEASE_TABLET | Freq: Every day | ORAL | Status: DC
  Administered 2024-06-03 – 2024-06-11 (×9): 40 mg via ORAL
  Filled 2024-06-03 (×9): qty 1

## 2024-06-03 MED ORDER — HYDRALAZINE HCL 20 MG/ML IJ SOLN: 10.0000 mg | INTRAMUSCULAR | Status: DC | PRN

## 2024-06-03 MED ORDER — VANCOMYCIN HCL IN DEXTROSE 1-5 GM/200ML-% IV SOLN
1000.0000 mg | Freq: Once | INTRAVENOUS | Status: AC
  Administered 2024-06-03: 1000 mg via INTRAVENOUS
  Filled 2024-06-03: qty 200

## 2024-06-03 MED ORDER — CLOPIDOGREL BISULFATE 75 MG PO TABS
75.0000 mg | ORAL_TABLET | Freq: Every day | ORAL | Status: DC
  Administered 2024-06-03 – 2024-06-11 (×9): 75 mg via ORAL
  Filled 2024-06-03 (×9): qty 1

## 2024-06-03 MED ORDER — POTASSIUM CHLORIDE CRYS ER 20 MEQ PO TBCR
60.0000 meq | EXTENDED_RELEASE_TABLET | Freq: Once | ORAL | Status: AC
  Administered 2024-06-03: 60 meq via ORAL
  Filled 2024-06-03: qty 3

## 2024-06-03 MED ORDER — SODIUM CHLORIDE 0.9 % IV SOLN
2.0000 g | Freq: Once | INTRAVENOUS | Status: AC
  Administered 2024-06-03: 2 g via INTRAVENOUS
  Filled 2024-06-03: qty 12.5

## 2024-06-03 MED ORDER — ENOXAPARIN SODIUM 30 MG/0.3ML IJ SOSY: 30.0000 mg | PREFILLED_SYRINGE | INTRAMUSCULAR | Status: DC

## 2024-06-03 MED ORDER — METRONIDAZOLE 500 MG/100ML IV SOLN
500.0000 mg | Freq: Once | INTRAVENOUS | Status: AC
  Administered 2024-06-03: 500 mg via INTRAVENOUS
  Filled 2024-06-03: qty 100

## 2024-06-03 MED ORDER — METOPROLOL TARTRATE 5 MG/5ML IV SOLN: 5.0000 mg | INTRAVENOUS | Status: DC | PRN

## 2024-06-03 MED ORDER — IPRATROPIUM-ALBUTEROL 0.5-2.5 (3) MG/3ML IN SOLN: 3.0000 mL | RESPIRATORY_TRACT | Status: DC | PRN

## 2024-06-03 MED ORDER — LEVOTHYROXINE SODIUM 88 MCG PO TABS
88.0000 ug | ORAL_TABLET | Freq: Every day | ORAL | Status: DC
  Administered 2024-06-03 – 2024-06-11 (×7): 88 ug via ORAL
  Filled 2024-06-03 (×9): qty 1

## 2024-06-03 MED ORDER — GLUCAGON HCL RDNA (DIAGNOSTIC) 1 MG IJ SOLR: 1.0000 mg | INTRAMUSCULAR | Status: DC | PRN

## 2024-06-03 MED ORDER — INFLUENZA VAC SPLIT HIGH-DOSE 0.5 ML IM SUSY
0.5000 mL | PREFILLED_SYRINGE | INTRAMUSCULAR | Status: AC
  Administered 2024-06-04: 0.5 mL via INTRAMUSCULAR
  Filled 2024-06-03: qty 0.5

## 2024-06-03 MED ORDER — PNEUMOCOCCAL 20-VAL CONJ VACC 0.5 ML IM SUSY
0.5000 mL | PREFILLED_SYRINGE | INTRAMUSCULAR | Status: AC
  Administered 2024-06-04: 0.5 mL via INTRAMUSCULAR
  Filled 2024-06-03: qty 0.5

## 2024-06-03 MED ORDER — POTASSIUM CHLORIDE 10 MEQ/100ML IV SOLN
10.0000 meq | INTRAVENOUS | Status: AC
  Administered 2024-06-03 (×6): 10 meq via INTRAVENOUS
  Filled 2024-06-03 (×5): qty 100

## 2024-06-03 MED ORDER — SODIUM CHLORIDE 0.9 % IV BOLUS
500.0000 mL | Freq: Once | INTRAVENOUS | Status: AC
Start: 2024-06-03 — End: 2024-06-03
  Administered 2024-06-03: 500 mL via INTRAVENOUS

## 2024-06-03 MED ORDER — CALCIUM GLUCONATE-NACL 2-0.675 GM/100ML-% IV SOLN
2.0000 g | Freq: Once | INTRAVENOUS | Status: AC
  Administered 2024-06-03: 2000 mg via INTRAVENOUS
  Filled 2024-06-03: qty 100

## 2024-06-03 NOTE — ED Notes (Signed)
 Patient called out stating brief was saturated, unable to measure. Cleaned and new brief applied.

## 2024-06-03 NOTE — Progress Notes (Signed)
 Orthopedic Tech Progress Note Patient Details:  Kaitlyn Wright 1946/05/22 [id number]  Ortho Devices Type of Ortho Device: Lumbar corsett Ortho Device/Splint Location: adjusted, now at bedside Ortho Device/Splint Interventions: Ordered, Adjustment   Post Interventions Patient Tolerated: Well Instructions Provided: Care of device, Adjustment of device  Kaitlyn Wright Ronal Brasil 06/03/2024, 1:18 PM

## 2024-06-03 NOTE — Plan of Care (Signed)
 Pharmacy reported that micro lab informed 1 of 3 blood cultures is strep epidermidis which is contaminant and no antibiotic is recommended.  Reviewed patient's clinical scenario.  Patient is afebrile.  Leukocytosis in the setting of rhabdomyolysis and reactive.  At this time there is no need for IV antibiotic.

## 2024-06-03 NOTE — Progress Notes (Signed)
 PROGRESS NOTE    Kaitlyn Wright  FMW:[id number] DOB: 13-Jul-1946 DOA: 06/02/2024 PCP: Arloa Elsie SAUNDERS, MD    Brief Narrative:  78 year old with history of CAD status post CABG May 2025 on aspirin  and Plavix  complicated by postop A-fib not on anticoagulation, transaminitis requiring discontinuation of amiodarone , hypothyroidism, severe MR, cervical spondylosis, anxiety/depression comes to the hospital due to altered mental status.  She was admitted about 2 months ago due to unknown cause of encephalopathy but was found to be hypotensive requiring vasopressors and eventually discharged on midodrine .  She was noted to have severe MR however declined any further workup.  SNF was recommended but patient eventually decided to go home with home health services. Upon admission noted to have some leukocytosis, UDS positive for benzodiazepine.  CT lumbar spine shows acute/subacute L2 compression fracture.  Rest of the trauma workup is unremarkable.   Assessment & Plan:    Acute metabolic encephalopathy Trauma workup is negative.  Likely from dehydration.  UDS positive for benzodiazepine but did receive Versed  in EMS.  Currently patient is alert awake and oriented X3.  Ammonia and TSH are normal.    Leukocytosis and lactic acidosis Could be reactive in nature versus dehydration.  Lactic acidosis is already resolved.  Follow culture data.  No meningeal signs.  Procalcitonin 0.11.  Received vancomycin  and cefepime  in the ER but will hold off on further antibiotics at this time    Rhabdomyolysis Initial CK level 696 currently trending upwards.  Continue IV fluids   Elevated liver enzymes Could be in the setting of rhabdomyolysis.  Continue IV fluids   Diarrhea No evidence of colitis on CT.  C. difficile PCR and GI pathogen panel, enteric precautions   Elevated troponin CAD status post CABG 01/27/2024 Troponin 51> 66 in the setting of fall/rhabdomyolysis.  EKG without STEMI and patient is not  endorsing anginal symptoms.  Continue to trend troponin. Hold statin given elevated liver enzymes/rhabdomyolysis.  Continue aspirin , Plavix , and metoprolol .  If necessary will update echocardiogram   Hypomagnesemia/hypocalcemia/hypokalemia QT prolongation Aggressive repletion.  Check phosphorus   Acute to subacute L2 superior endplate compression fracture PT/OT Vitamin D  levels   Fall PT/OT eval, fall precautions.  TOC consulted for placement.   Paroxysmal A-fib Not on anticoagulation.  Continue metoprolol .   Hypothyroidism Continue Synthroid .   Severe mitral regurgitation Patient has previously declined further cardiac workup.   GERD Continue Protonix .   DVT prophylaxis: Lovenox  Code Status: Full Code (discussed with the patient) Family Communication: No family available at this time.    PT Follow up Recs:   Subjective: Seen at bedside in the ER.  Alert awake oriented X3.  Feeling better. Tells me she had fallen at home due to multiple throw rugs   Examination:  General exam: Appears calm and comfortable  Respiratory system: Clear to auscultation. Respiratory effort normal. Cardiovascular system: S1 & S2 heard, RRR. No JVD, murmurs, rubs, gallops or clicks. No pedal edema. Gastrointestinal system: Abdomen is nondistended, soft and nontender. No organomegaly or masses felt. Normal bowel sounds heard. Central nervous system: Alert and oriented. No focal neurological deficits. Extremities: Symmetric 5 x 5 power. Skin: No rashes, lesions or ulcers Psychiatry: Judgement and insight appear normal. Mood & affect appropriate.                Diet Orders (From admission, onward)     Start     Ordered   06/03/24 0134  Diet Heart Room service appropriate? Yes; Fluid consistency: Thin  Diet effective now       Question Answer Comment  Room service appropriate? Yes   Fluid consistency: Thin      06/03/24 0136            Objective: Vitals:   06/03/24  0500 06/03/24 0600 06/03/24 0745 06/03/24 0815  BP: (!) 117/55 118/61 124/64 127/67  Pulse: 81 84 85   Resp: 20 17 (!) 23   Temp:      TempSrc:      SpO2: 100% 98% 100%   Weight:        Intake/Output Summary (Last 24 hours) at 06/03/2024 0929 Last data filed at 06/03/2024 0852 Gross per 24 hour  Intake 3190.66 ml  Output --  Net 3190.66 ml   Filed Weights   06/02/24 2111  Weight: 49.9 kg    Scheduled Meds:  clopidogrel   75 mg Oral Daily   enoxaparin  (LOVENOX ) injection  30 mg Subcutaneous Q24H   levothyroxine   88 mcg Oral Q0600   metoprolol  succinate  25 mg Oral Daily   pantoprazole   40 mg Oral Daily   potassium chloride   40 mEq Oral Once   Continuous Infusions:  sodium chloride  75 mL/hr at 06/03/24 0839   calcium  gluconate 2,000 mg (06/03/24 0908)   magnesium  sulfate bolus IVPB 2 g (06/03/24 0838)   potassium chloride  10 mEq (06/03/24 0855)    Nutritional status     Body mass index is 19.49 kg/m.  Data Reviewed:   CBC: Recent Labs  Lab 06/02/24 2138 06/02/24 2144 06/02/24 2341  WBC  --  7.9 18.5*  NEUTROABS  --  7.2 15.1*  HGB 13.9  13.9 5.0* 12.5  HCT 41.0  41.0 16.8* 38.1  MCV  --  104.3* 94.5  PLT  --  148* 334   Basic Metabolic Panel: Recent Labs  Lab 06/02/24 2138 06/02/24 2144 06/03/24 0259 06/03/24 0351  NA 144  144 143 SPECIMEN CONTAMINATED, UNABLE TO PERFORM TEST(S). 142  K 3.9  3.9 3.9 SPECIMEN CONTAMINATED, UNABLE TO PERFORM TEST(S). 2.3*  CL 106 103 SPECIMEN CONTAMINATED, UNABLE TO PERFORM TEST(S). 113*  CO2  --  20* SPECIMEN CONTAMINATED, UNABLE TO PERFORM TEST(S). 20*  GLUCOSE 106* 99 SPECIMEN CONTAMINATED, UNABLE TO PERFORM TEST(S). 96  BUN 37* 26* SPECIMEN CONTAMINATED, UNABLE TO PERFORM TEST(S). 18  CREATININE 0.60 0.83 SPECIMEN CONTAMINATED, UNABLE TO PERFORM TEST(S). 0.60  CALCIUM   --  9.1 SPECIMEN CONTAMINATED, UNABLE TO PERFORM TEST(S). 6.7*  MG  --  0.6*  --  2.9*   GFR: Estimated Creatinine Clearance: 44.2 mL/min  (by C-G formula based on SCr of 0.6 mg/dL). Liver Function Tests: Recent Labs  Lab 06/02/24 2144 06/03/24 0259 06/03/24 0351  AST 86* SPECIMEN CONTAMINATED, UNABLE TO PERFORM TEST(S). 191*  ALT 75* SPECIMEN CONTAMINATED, UNABLE TO PERFORM TEST(S). 166*  ALKPHOS 148* SPECIMEN CONTAMINATED, UNABLE TO PERFORM TEST(S). 83  BILITOT 0.9 SPECIMEN CONTAMINATED, UNABLE TO PERFORM TEST(S). 1.5*  PROT 6.1* SPECIMEN CONTAMINATED, UNABLE TO PERFORM TEST(S). 4.2*  ALBUMIN  3.1* SPECIMEN CONTAMINATED, UNABLE TO PERFORM TEST(S). 2.0*   Recent Labs  Lab 06/02/24 2144  LIPASE 16   Recent Labs  Lab 06/02/24 2341  AMMONIA 18   Coagulation Profile: No results for input(s): INR, PROTIME in the last 168 hours. Cardiac Enzymes: Recent Labs  Lab 06/02/24 2144 06/03/24 0351  CKTOTAL 696* 2,721*   BNP (last 3 results) No results for input(s): PROBNP in the last 8760 hours. HbA1C: No results for input(s): HGBA1C in the last 72 hours.  CBG: No results for input(s): GLUCAP in the last 168 hours. Lipid Profile: No results for input(s): CHOL, HDL, LDLCALC, TRIG, CHOLHDL, LDLDIRECT in the last 72 hours. Thyroid  Function Tests: Recent Labs    06/02/24 2155  TSH 1.320   Anemia Panel: No results for input(s): VITAMINB12, FOLATE, FERRITIN, TIBC, IRON, RETICCTPCT in the last 72 hours. Sepsis Labs: Recent Labs  Lab 06/02/24 2138 06/03/24 0127 06/03/24 0351  PROCALCITON  --   --  0.11  LATICACIDVEN 2.7* 1.0  --     Recent Results (from the past 240 hours)  Blood culture (routine x 2)     Status: None (Preliminary result)   Collection Time: 06/02/24  9:55 PM   Specimen: BLOOD RIGHT FOREARM  Result Value Ref Range Status   Specimen Description BLOOD RIGHT FOREARM  Final   Special Requests   Final    BOTTLES DRAWN AEROBIC AND ANAEROBIC Blood Culture adequate volume   Culture   Final    NO GROWTH < 12 HOURS Performed at [facility], [address] [place], [place] [address]    Report Status PENDING  Incomplete  Blood culture (routine x 2)     Status: None (Preliminary result)   Collection Time: 06/02/24  9:55 PM   Specimen: BLOOD RIGHT ARM  Result Value Ref Range Status   Specimen Description BLOOD RIGHT ARM  Final   Special Requests   Final    BOTTLES DRAWN AEROBIC ONLY Blood Culture adequate volume   Culture   Final    NO GROWTH < 12 HOURS Performed at [facility], [address] [place], [place] [address]    Report Status PENDING  Incomplete         Radiology Studies: CT CHEST ABDOMEN PELVIS W CONTRAST Result Date: 06/02/2024 CLINICAL DATA:  Polytrauma, blunt fall on Plavix  from home (lives by self), found on the floor facedown by daughter who had to brwak into her house tonight EXAM: CT CHEST, ABDOMEN, AND PELVIS WITH CONTRAST TECHNIQUE: Multidetector CT imaging of the chest, abdomen and pelvis was performed following the standard protocol during bolus administration of intravenous contrast. RADIATION DOSE REDUCTION: This exam was performed according to the departmental dose-optimization program which includes automated exposure control, adjustment of the mA and/or kV according to patient size and/or use of iterative reconstruction technique. CONTRAST:  75mL OMNIPAQUE  IOHEXOL  350 MG/ML SOLN COMPARISON:  None Available. FINDINGS: CHEST: Cardiovascular: No aortic injury. The thoracic aorta is normal in caliber. The heart is normal in size. No significant pericardial effusion. Severe atherosclerotic plaque. Coronary artery calcifications status post coronary artery bypass graft. The main pulmonary artery is normal in caliber. No central or proximal segmental pulmonary embolus. Limited evaluation more distally due to timing of contrast. Mediastinum/Nodes: No pneumomediastinum. No mediastinal hematoma. The esophagus is unremarkable. The thyroid  is unremarkable. The central airways are patent. No mediastinal, hilar, or axillary  lymphadenopathy. Lungs/Pleura: No focal consolidation. No pulmonary nodule. No pulmonary mass. No pulmonary contusion or laceration. No pneumatocele formation. No pleural effusion. No pneumothorax. No hemothorax. Musculoskeletal/Chest wall: No chest wall mass. No acute rib or sternal fracture. Please see separately dictated CT thoracolumbar spine. ABDOMEN / PELVIS: Hepatobiliary: Not enlarged. Focal fatty infiltration along falciform ligament. No focal lesion. No laceration or subcapsular hematoma. Status post cholecystectomy.  No biliary ductal dilatation. Pancreas: Normal pancreatic contour. No main pancreatic duct dilatation. Spleen: Not enlarged. Scattered calcifications consistent with sequelae of prior granulomatous disease. No focal lesion. No laceration, subcapsular hematoma,  or vascular injury. Adrenals/Urinary Tract: No nodularity bilaterally. Bilateral kidneys enhance symmetrically. No hydronephrosis. No contusion, laceration, or subcapsular hematoma. No injury to the vascular structures or collecting systems. No hydroureter. The urinary bladder is unremarkable. Stomach/Bowel: No small or large bowel wall thickening or dilatation. Stool throughout the majority of the colon. Colonic diverticulosis. The appendix is unremarkable. Vasculature/Lymphatics: Severe atherosclerotic plaque. No abdominal aorta or iliac aneurysm. No active contrast extravasation or pseudoaneurysm. No abdominal, pelvic, inguinal lymphadenopathy. Reproductive: Uterus is unremarkable.  No adnexal mass. Other: No simple free fluid ascites. No pneumoperitoneum. No hemoperitoneum. No mesenteric hematoma identified. No organized fluid collection. Musculoskeletal: No significant soft tissue hematoma. No acute pelvic fracture. Please see separately dictated CT thoracolumbar spine. Other ports and devices: None. IMPRESSION: 1. No acute intrathoracic, intra-abdominal, intrapelvic traumatic injury. 2. Please see separately dictated CT  thoracolumbar spine. 3. Aortic Atherosclerosis (ICD10-I70.0) and Emphysema (ICD10-J43.9). Electronically Signed   By: Morgane  Naveau M.D.   On: 06/02/2024 23:46   CT L-SPINE NO CHARGE Result Date: 06/02/2024 CLINICAL DATA:  Neck trauma, midline tenderness (Age [age]y) fall on Plavix  from home (lives by self), found on the floor facedown by daughter who had to brwak into her house tonight EXAM: CT CERVICAL, THORACIC, AND LUMBAR SPINE WITHOUT CONTRAST TECHNIQUE: Multidetector CT imaging of the cervical, thoracic and lumbar spine was performed without intravenous contrast. Multiplanar CT image reconstructions were also generated. RADIATION DOSE REDUCTION: This exam was performed according to the departmental dose-optimization program which includes automated exposure control, adjustment of the mA and/or kV according to patient size and/or use of iterative reconstruction technique. COMPARISON:  MRI cervical spine 04/10/2024. CT abdomen pelvis 03/14/2024 FINDINGS: CT CERVICAL SPINE FINDINGS Alignment: Normal. Skull base and vertebrae: Diffusely decreased bone density. Multilevel mild moderate degenerative changes of the spine. No associated severe osseous neural foraminal or central canal stenosis. No acute fracture. No aggressive appearing focal osseous lesion or focal pathologic process. Soft tissues and spinal canal: No prevertebral fluid or swelling. No visible canal hematoma. Upper chest: Biapical pleural/pulmonary scarring. Centrilobular emphysematous changes. Other: None. CT THORACIC SPINE FINDINGS Alignment: Normal. Vertebrae: Diffusely decreased bone density. Chronic stable T11 compression fracture. No acute fracture or focal pathologic process. Paraspinal and other soft tissues: Negative. Disc levels: Maintained. CT LUMBAR SPINE FINDINGS Segmentation: 5 lumbar type vertebrae. Alignment: Normal. Vertebrae: Diffusely decreased bone density. Multilevel moderate degenerative changes of the spine. Acute to  subacute L2 superior endplate compression fracture. Focal pathologic process. Paraspinal and other soft tissues: Negative. Disc levels: Intervertebral disc space narrowing at the L1-L2 level. Intervertebral disc space vacuum phenomenon at the L4-L5 L5-S1 levels. IMPRESSION: 1.  Acute to subacute L2 superior endplate compression fracture. 2. No acute displaced fracture or traumatic listhesis of the cervical and thoracic spine. Chronic stable T11 compression fracture. 3. Diffusely decreased bone density. Electronically Signed   By: Morgane  Naveau M.D.   On: 06/02/2024 23:38   CT Cervical Spine Wo Contrast Result Date: 06/02/2024 CLINICAL DATA:  Neck trauma, midline tenderness (Age [age]y) fall on Plavix  from home (lives by self), found on the floor facedown by daughter who had to brwak into her house tonight EXAM: CT CERVICAL, THORACIC, AND LUMBAR SPINE WITHOUT CONTRAST TECHNIQUE: Multidetector CT imaging of the cervical, thoracic and lumbar spine was performed without intravenous contrast. Multiplanar CT image reconstructions were also generated. RADIATION DOSE REDUCTION: This exam was performed according to the departmental dose-optimization program which includes automated exposure control, adjustment of the mA and/or kV according to patient size and/or use  of iterative reconstruction technique. COMPARISON:  MRI cervical spine 04/10/2024. CT abdomen pelvis 03/14/2024 FINDINGS: CT CERVICAL SPINE FINDINGS Alignment: Normal. Skull base and vertebrae: Diffusely decreased bone density. Multilevel mild moderate degenerative changes of the spine. No associated severe osseous neural foraminal or central canal stenosis. No acute fracture. No aggressive appearing focal osseous lesion or focal pathologic process. Soft tissues and spinal canal: No prevertebral fluid or swelling. No visible canal hematoma. Upper chest: Biapical pleural/pulmonary scarring. Centrilobular emphysematous changes. Other: None. CT THORACIC SPINE  FINDINGS Alignment: Normal. Vertebrae: Diffusely decreased bone density. Chronic stable T11 compression fracture. No acute fracture or focal pathologic process. Paraspinal and other soft tissues: Negative. Disc levels: Maintained. CT LUMBAR SPINE FINDINGS Segmentation: 5 lumbar type vertebrae. Alignment: Normal. Vertebrae: Diffusely decreased bone density. Multilevel moderate degenerative changes of the spine. Acute to subacute L2 superior endplate compression fracture. Focal pathologic process. Paraspinal and other soft tissues: Negative. Disc levels: Intervertebral disc space narrowing at the L1-L2 level. Intervertebral disc space vacuum phenomenon at the L4-L5 L5-S1 levels. IMPRESSION: 1.  Acute to subacute L2 superior endplate compression fracture. 2. No acute displaced fracture or traumatic listhesis of the cervical and thoracic spine. Chronic stable T11 compression fracture. 3. Diffusely decreased bone density. Electronically Signed   By: Morgane  Naveau M.D.   On: 06/02/2024 23:38   CT T-SPINE NO CHARGE Result Date: 06/02/2024 CLINICAL DATA:  Neck trauma, midline tenderness (Age 26-64y) fall on Plavix  from home (lives by self), found on the floor facedown by daughter who had to brwak into her house tonight EXAM: CT CERVICAL, THORACIC, AND LUMBAR SPINE WITHOUT CONTRAST TECHNIQUE: Multidetector CT imaging of the cervical, thoracic and lumbar spine was performed without intravenous contrast. Multiplanar CT image reconstructions were also generated. RADIATION DOSE REDUCTION: This exam was performed according to the departmental dose-optimization program which includes automated exposure control, adjustment of the mA and/or kV according to patient size and/or use of iterative reconstruction technique. COMPARISON:  MRI cervical spine 04/10/2024. CT abdomen pelvis 03/14/2024 FINDINGS: CT CERVICAL SPINE FINDINGS Alignment: Normal. Skull base and vertebrae: Diffusely decreased bone density. Multilevel mild moderate  degenerative changes of the spine. No associated severe osseous neural foraminal or central canal stenosis. No acute fracture. No aggressive appearing focal osseous lesion or focal pathologic process. Soft tissues and spinal canal: No prevertebral fluid or swelling. No visible canal hematoma. Upper chest: Biapical pleural/pulmonary scarring. Centrilobular emphysematous changes. Other: None. CT THORACIC SPINE FINDINGS Alignment: Normal. Vertebrae: Diffusely decreased bone density. Chronic stable T11 compression fracture. No acute fracture or focal pathologic process. Paraspinal and other soft tissues: Negative. Disc levels: Maintained. CT LUMBAR SPINE FINDINGS Segmentation: 5 lumbar type vertebrae. Alignment: Normal. Vertebrae: Diffusely decreased bone density. Multilevel moderate degenerative changes of the spine. Acute to subacute L2 superior endplate compression fracture. Focal pathologic process. Paraspinal and other soft tissues: Negative. Disc levels: Intervertebral disc space narrowing at the L1-L2 level. Intervertebral disc space vacuum phenomenon at the L4-L5 L5-S1 levels. IMPRESSION: 1.  Acute to subacute L2 superior endplate compression fracture. 2. No acute displaced fracture or traumatic listhesis of the cervical and thoracic spine. Chronic stable T11 compression fracture. 3. Diffusely decreased bone density. Electronically Signed   By: Morgane  Naveau M.D.   On: 06/02/2024 23:38   CT Head Wo Contrast Result Date: 06/02/2024 CLINICAL DATA:  Head trauma, moderate-severe EXAM: CT HEAD WITHOUT CONTRAST TECHNIQUE: Contiguous axial images were obtained from the base of the skull through the vertex without intravenous contrast. RADIATION DOSE REDUCTION: This exam was performed  according to the departmental dose-optimization program which includes automated exposure control, adjustment of the mA and/or kV according to patient size and/or use of iterative reconstruction technique. COMPARISON:  None Available.  FINDINGS: Brain: Patchy and confluent areas of decreased attenuation are noted throughout the deep and periventricular white matter of the cerebral hemispheres bilaterally, compatible with chronic microvascular ischemic disease. No evidence of large-territorial acute infarction. No parenchymal hemorrhage. No mass lesion. No extra-axial collection. No mass effect or midline shift. No hydrocephalus. Basilar cisterns are patent. Vascular: No hyperdense vessel. Atherosclerotic calcifications are present within the cavernous internal carotid arteries. Skull: No acute fracture or focal lesion. Sinuses/Orbits: Paranasal sinuses and mastoid air cells are clear. The orbits are unremarkable. Other: None. IMPRESSION: No acute intracranial abnormality. Electronically Signed   By: Morgane  Naveau M.D.   On: 06/02/2024 23:30   DG Shoulder Left Result Date: 06/02/2024 CLINICAL DATA:  fall EXAM: LEFT SHOULDER - 2+ VIEW COMPARISON:  None Available. FINDINGS: There is no evidence of fracture or dislocation. There is no evidence of arthropathy or other focal bone abnormality. Soft tissues are unremarkable. IMPRESSION: Negative. Electronically Signed   By: Morgane  Naveau M.D.   On: 06/02/2024 21:58   DG Pelvis 1-2 Views Result Date: 06/02/2024 CLINICAL DATA:  fall EXAM: PELVIS - 1-2 VIEW COMPARISON:  X-ray pelvis 02/02/2023 FINDINGS: Limited evaluation due to overlapping osseous structures and overlying soft tissues. There is no evidence of pelvic fracture or diastasis. No acute displaced fracture or dislocation of either hips. No pelvic bone lesions are seen. IMPRESSION: Negative for acute traumatic injury. Electronically Signed   By: Morgane  Naveau M.D.   On: 06/02/2024 21:51   DG Knee 1-2 Views Right Result Date: 06/02/2024 CLINICAL DATA:  [id number] Fall [id number] EXAM: RIGHT KNEE - 1-2 VIEW COMPARISON:  None Available. FINDINGS: No evidence of fracture, dislocation, or joint effusion. Mild to moderate medial tibiofemoral joint  degenerative changes. Soft tissues are unremarkable. Vascular clips overlie the medial proximal leg. IMPRESSION: No acute displaced fracture or dislocation. Electronically Signed   By: Morgane  Naveau M.D.   On: 06/02/2024 21:50   DG Knee 1-2 Views Left Result Date: 06/02/2024 CLINICAL DATA:  [id number] Fall [id number] EXAM: LEFT KNEE - 1-2 VIEW COMPARISON:  None Available. FINDINGS: No evidence of fracture, dislocation, or joint effusion. No evidence of arthropathy or other focal bone abnormality. Soft tissues are unremarkable. Likely phlebolith posterior to the knee joint. IMPRESSION: No acute displaced fracture or dislocation. Electronically Signed   By: Morgane  Naveau M.D.   On: 06/02/2024 21:50   DG Chest Portable 1 View Result Date: 06/02/2024 CLINICAL DATA:  fall EXAM: PORTABLE CHEST 1 VIEW COMPARISON:  Chest x-ray 04/06/2024 FINDINGS: The heart and mediastinal contours are unchanged. Atherosclerotic plaque. No focal consolidation. No pulmonary edema. No pleural effusion. No pneumothorax. No acute osseous abnormality.  Sternotomy wires are intact. IMPRESSION: 1. No active disease. 2.  Aortic Atherosclerosis (ICD10-I70.0). Electronically Signed   By: Morgane  Naveau M.D.   On: 06/02/2024 21:49           LOS: 0 days   Time spent= 35 mins    [place]ess JAYSON Dare, MD [facility]  If 7PM-7AM, please contact night-coverage  06/03/2024, 9:29 AM

## 2024-06-03 NOTE — Hospital Course (Addendum)
 Brief Narrative:  78 year old with history of CAD status post CABG May 2025 on aspirin  and Plavix  complicated by postop A-fib not on anticoagulation, transaminitis requiring discontinuation of amiodarone , hypothyroidism, severe MR, cervical spondylosis, anxiety/depression comes to the hospital due to altered mental status.  She was admitted about 2 months ago due to unknown cause of encephalopathy but was found to be hypotensive requiring vasopressors and eventually discharged on midodrine .  She was noted to have severe MR however declined any further workup.  SNF was recommended but patient eventually decided to go home with home health services. Upon admission noted to have some leukocytosis, UDS positive for benzodiazepine.  CT lumbar spine shows acute/subacute L2 compression fracture.  Rest of the trauma workup is unremarkable.   Assessment & Plan:    Acute metabolic encephalopathy Trauma workup is negative.  Likely from dehydration.  UDS positive for benzodiazepine but did receive Versed  in EMS.  Currently patient is alert awake and oriented X3.  Ammonia and TSH are normal.    Leukocytosis and lactic acidosis Could be reactive in nature versus dehydration.  Lactic acidosis is already resolved.  Follow culture data.  No meningeal signs.  Procalcitonin 0.11.  Received vancomycin  and cefepime  in the ER but will hold off on further antibiotics at this time    Rhabdomyolysis Initial CK level 696 currently trending upwards.  Continue IV fluids   Elevated liver enzymes Could be in the setting of rhabdomyolysis.  Continue IV fluids   Diarrhea No evidence of colitis on CT.  C. difficile PCR and GI pathogen panel, enteric precautions   Elevated troponin CAD status post CABG 01/27/2024 Troponin 51> 66 in the setting of fall/rhabdomyolysis.  EKG without STEMI and patient is not endorsing anginal symptoms.  Continue to trend troponin. Hold statin given elevated liver enzymes/rhabdomyolysis.   Continue aspirin , Plavix , and metoprolol .  If necessary will update echocardiogram   Hypomagnesemia/hypocalcemia/hypokalemia QT prolongation Aggressive repletion.  Check phosphorus   Acute to subacute L2 superior endplate compression fracture PT/OT Vitamin D  levels   Fall PT/OT eval, fall precautions.  TOC consulted for placement.   Paroxysmal A-fib Not on anticoagulation.  Continue metoprolol .   Hypothyroidism Continue Synthroid .   Severe mitral regurgitation Patient has previously declined further cardiac workup.   GERD Continue Protonix .   DVT prophylaxis: Lovenox  Code Status: Full Code (discussed with the patient) Family Communication: No family available at this time.    PT Follow up Recs:   Subjective: Seen at bedside in the ER.  Alert awake oriented X3.  Feeling better. Tells me she had fallen at home due to multiple throw rugs   Examination:  General exam: Appears calm and comfortable  Respiratory system: Clear to auscultation. Respiratory effort normal. Cardiovascular system: S1 & S2 heard, RRR. No JVD, murmurs, rubs, gallops or clicks. No pedal edema. Gastrointestinal system: Abdomen is nondistended, soft and nontender. No organomegaly or masses felt. Normal bowel sounds heard. Central nervous system: Alert and oriented. No focal neurological deficits. Extremities: Symmetric 5 x 5 power. Skin: No rashes, lesions or ulcers Psychiatry: Judgement and insight appear normal. Mood & affect appropriate.

## 2024-06-03 NOTE — Progress Notes (Signed)
   06/02/24 2300  Spiritual Encounters  Type of Visit Initial  Care provided to: Pt and family  Reason for visit Trauma  OnCall Visit Yes    Chaplain was paged for level 2. Her daughter was at the bedside. She said that after calling her mother many times, she couldn't reach out to her and she broke into her house. She found her lying face down on the floor.  The patient's eyes were close at the time. She was responsive only to her daughter.  Chaplain was compassionately present, listened to her story, provided emotional support, and prayer upon her daughter's request.    Zachary Susanna Kerry Resident [phone]

## 2024-06-03 NOTE — Progress Notes (Signed)
 PHARMACY - PHYSICIAN COMMUNICATION CRITICAL VALUE ALERT - BLOOD CULTURE IDENTIFICATION (BCID)  Kaitlyn Wright is an 78 y.o. female who presented to [facility] on 06/02/2024 with a chief complaint of AMS  Assessment:  1 of 3 blood cultures with staph epi, no resistance  Name of physician (or Provider) Contacted: Sundil  Current antibiotics: none  Changes to prescribed antibiotics recommended:  None, likely is a contaminant.  Results for orders placed or performed during the hospital encounter of 06/02/24  Blood Culture ID Panel (Reflexed) (Collected: 06/02/2024  9:55 PM)  Result Value Ref Range   Enterococcus faecalis NOT DETECTED NOT DETECTED   Enterococcus Faecium NOT DETECTED NOT DETECTED   Listeria monocytogenes NOT DETECTED NOT DETECTED   Staphylococcus species DETECTED (A) NOT DETECTED   Staphylococcus aureus (BCID) NOT DETECTED NOT DETECTED   Staphylococcus epidermidis DETECTED (A) NOT DETECTED   Staphylococcus lugdunensis NOT DETECTED NOT DETECTED   Streptococcus species NOT DETECTED NOT DETECTED   Streptococcus agalactiae NOT DETECTED NOT DETECTED   Streptococcus pneumoniae NOT DETECTED NOT DETECTED   Streptococcus pyogenes NOT DETECTED NOT DETECTED   A.calcoaceticus-baumannii NOT DETECTED NOT DETECTED   Bacteroides fragilis NOT DETECTED NOT DETECTED   Enterobacterales NOT DETECTED NOT DETECTED   Enterobacter cloacae complex NOT DETECTED NOT DETECTED   Escherichia coli NOT DETECTED NOT DETECTED   Klebsiella aerogenes NOT DETECTED NOT DETECTED   Klebsiella oxytoca NOT DETECTED NOT DETECTED   Klebsiella pneumoniae NOT DETECTED NOT DETECTED   Proteus species NOT DETECTED NOT DETECTED   Salmonella species NOT DETECTED NOT DETECTED   Serratia marcescens NOT DETECTED NOT DETECTED   Haemophilus influenzae NOT DETECTED NOT DETECTED   Neisseria meningitidis NOT DETECTED NOT DETECTED   Pseudomonas aeruginosa NOT DETECTED NOT DETECTED   Stenotrophomonas maltophilia NOT DETECTED  NOT DETECTED   Candida albicans NOT DETECTED NOT DETECTED   Candida auris NOT DETECTED NOT DETECTED   Candida glabrata NOT DETECTED NOT DETECTED   Candida krusei NOT DETECTED NOT DETECTED   Candida parapsilosis NOT DETECTED NOT DETECTED   Candida tropicalis NOT DETECTED NOT DETECTED   Cryptococcus neoformans/gattii NOT DETECTED NOT DETECTED   Methicillin resistance mecA/C NOT DETECTED NOT DETECTED    Harlene Denna Berdine JONETTA, BCPS, BCCP Clinical Pharmacist  06/03/2024 9:40 PM   [facility] pharmacy phone numbers are listed on [email or web address]

## 2024-06-03 NOTE — Evaluation (Signed)
 Physical Therapy Evaluation Patient Details Name: Kaitlyn Wright MRN: [id number] DOB: September 09, 1946 Today's Date: 06/03/2024  History of Present Illness  Pt is 78 year old presented to [facility] on  06/02/24 for fall and AMS. Pt found face down at home with unknown time down. Pt found to have acute to subacute L2 endplate compression fx. PMH - CAD s/p CABG 01/27/24, Afib, anxiety, depression, hypothyroidism, HLD.  Clinical Impression  Pt admitted with above diagnosis and presents to PT with functional limitations due to deficits listed below (See PT problem list). Pt needs skilled PT to maximize independence and safety. Pt lives at home alone with some intermittent assist of a friend. Currently requiring mod assist to stand. Patient will benefit from continued inpatient follow up therapy, <3 hours/day which was recommended last admission and pt opted to return home.           If plan is discharge home, recommend the following: A lot of help with walking and/or transfers;A lot of help with bathing/dressing/bathroom;Assistance with cooking/housework;Assist for transportation   Can travel by private vehicle   No    Equipment Recommendations None recommended by PT  Recommendations for Other Services       Functional Status Assessment Patient has had a recent decline in their functional status and demonstrates the ability to make significant improvements in function in a reasonable and predictable amount of time.     Precautions / Restrictions Precautions Precautions: Fall Required Braces or Orthoses: Spinal Brace Spinal Brace: Applied in sitting position (no specific orders) Restrictions Weight Bearing Restrictions Per Provider Order: No      Mobility  Bed Mobility Overal bed mobility: Needs Assistance Bed Mobility: Rolling, Sidelying to Sit, Sit to Sidelying Rolling: Mod assist Sidelying to sit: Max assist     Sit to sidelying: Max assist General bed mobility comments: Assist to bring  shoulders/hips over, elevate trunk and bring hips to EOB.    Transfers Overall transfer level: Needs assistance Equipment used: Rolling walker (2 wheels), 1 person hand held assist Transfers: Sit to/from Stand, Bed to chair/wheelchair/BSC Sit to Stand: Mod assist   Step pivot transfers: Mod assist       General transfer comment: Assist to power up and for balance. Bed to bsc with bil shelf arm support with pt having difficulty moving feet. BSC to bed with walker.    Ambulation/Gait               General Gait Details: Pt declined  Stairs            Wheelchair Mobility     Tilt Bed    Modified Rankin (Stroke Patients Only)       Balance Overall balance assessment: Needs assistance Sitting-balance support: Bilateral upper extremity supported, Feet supported Sitting balance-Leahy Scale: Poor Sitting balance - Comments: UE support   Standing balance support: Bilateral upper extremity supported, During functional activity Standing balance-Leahy Scale: Poor Standing balance comment: walker and min assist for static standing                             Pertinent Vitals/Pain Pain Assessment Pain Assessment: 0-10 Pain Location: bil hips/buttocks Pain Descriptors / Indicators: Aching, Sore Pain Intervention(s): Limited activity within patient's tolerance, Monitored during session, Repositioned    Home Living Family/patient expects to be discharged to:: Private residence Living Arrangements: Alone Available Help at Discharge: Family;Friend(s);Available PRN/intermittently Type of Home: Other(Comment) (condominium) Home Access: Level entry  Home Layout: One level Home Equipment: BSC/3in1;Rolling Walker (2 wheels);Rollator (4 wheels);Shower seat;Cane - single point      Prior Function Prior Level of Function : Independent/Modified Independent             Mobility Comments: Furniture walks in home       Extremity/Trunk Assessment    Upper Extremity Assessment Upper Extremity Assessment: Defer to OT evaluation    Lower Extremity Assessment Lower Extremity Assessment: Generalized weakness       Communication   Communication Communication: Impaired Factors Affecting Communication: Hearing impaired    Cognition Arousal: Alert Behavior During Therapy: WFL for tasks assessed/performed   PT - Cognitive impairments: Awareness, Memory, Attention, Problem solving, Safety/Judgement                       PT - Cognition Comments: Poor overall awareness of her functional limitations regarding safe return to home alone. Following commands: Impaired Following commands impaired: Only follows one step commands consistently     Cueing Cueing Techniques: Tactile cues, Verbal cues     General Comments General comments (skin integrity, edema, etc.): VSS on RA    Exercises     Assessment/Plan    PT Assessment Patient needs continued PT services  PT Problem List Decreased strength;Decreased activity tolerance;Decreased balance;Decreased mobility;Decreased cognition;Decreased safety awareness;Pain       PT Treatment Interventions DME instruction;Gait training;Functional mobility training;Therapeutic activities;Therapeutic exercise;Balance training;Patient/family education    PT Goals (Current goals can be found in the Care Plan section)  Acute Rehab PT Goals Patient Stated Goal: go home PT Goal Formulation: With patient Time For Goal Achievement: 06/17/24 Potential to Achieve Goals: Fair    Frequency Min 2X/week     Co-evaluation               AM-PAC PT 6 Clicks Mobility  Outcome Measure Help needed turning from your back to your side while in a flat bed without using bedrails?: A Lot Help needed moving from lying on your back to sitting on the side of a flat bed without using bedrails?: A Lot Help needed moving to and from a bed to a chair (including a wheelchair)?: A Lot Help needed  standing up from a chair using your arms (e.g., wheelchair or bedside chair)?: A Lot Help needed to walk in hospital room?: Total Help needed climbing 3-5 steps with a railing? : Total 6 Click Score: 10    End of Session Equipment Utilized During Treatment: Back brace Activity Tolerance: Patient tolerated treatment well Patient left: in bed;with call bell/phone within reach;with bed alarm set Nurse Communication: Mobility status;Other (comment) (stool in bsc for stool sample) PT Visit Diagnosis: Unsteadiness on feet (R26.81);Other abnormalities of gait and mobility (R26.89);History of falling (Z91.81);Muscle weakness (generalized) (M62.81);Pain Pain - Right/Left:  (bil) Pain - part of body: Hip    Time: 8551-8461 PT Time Calculation (min) (ACUTE ONLY): 50 min   Charges:   PT Evaluation $PT Eval Moderate Complexity: 1 Mod PT Treatments $Therapeutic Activity: 23-37 mins PT General Charges $$ ACUTE PT VISIT: 1 Visit         [facility] PT Acute Rehabilitation Services Office [phone]   [facility] ORN [facility] 06/03/2024, 3:48 PM

## 2024-06-03 NOTE — Progress Notes (Signed)
 Pt being followed by [facility] for Sepsis protocol.

## 2024-06-03 NOTE — Plan of Care (Signed)
 Hypokalemia - Low potassium 2.3.  Ordered both IV and oral Kcl.  Pending mag level.

## 2024-06-03 NOTE — Progress Notes (Signed)
 Transition of Care [facility]) - CAGE-AID Screening   Patient Details  Name: KELLY RANIERI MRN: [id number] Date of Birth: 05/18/1946  [facility] CM/SW Contact:    Sallyanne MALVA Mettle, RN Phone Number: 06/03/2024, 8:15 PM   Clinical Narrative:  Denies drug and alcohol use, no resources indicated.  CAGE-AID Screening:    Have You Ever Felt You Ought to Cut Down on Your Drinking or Drug Use?: No Have People Annoyed You By Critizing Your Drinking Or Drug Use?: No Have You Felt Bad Or Guilty About Your Drinking Or Drug Use?: No Have You Ever Had a Drink or Used Drugs First Thing In The Morning to Steady Your Nerves or to Get Rid of a Hangover?: No CAGE-AID Score: 0  Substance Abuse Education Offered: No

## 2024-06-03 NOTE — H&P (Signed)
 History and Physical    [name]:[id number] DOB: 28-Jan-1946 DOA: 06/02/2024  PCP: Arloa Elsie SAUNDERS, MD  Patient coming from: Home  Chief Complaint: AMS  HPI: Kaitlyn Wright is a 78 y.o. female with medical history significant of CAD status post CABG 01/27/2024 on aspirin  and Plavix , complicated by postop paroxysmal A-fib not on anticoagulation, elevated LFTs leading to discontinuation of amiodarone  towards the end of June, hypothyroidism, severe mitral regurgitation, cervical spondylosis and multilevel foraminal stenosis, anxiety/depression presenting to the ED via EMS for evaluation of altered mental status.  Patient was found facedown on the ground at home by a family friend.  Reportedly family is estranged and has not talked to the patient for weeks.  Patient last spoke to a friend on Tuesday and nobody had heard from her since.  She was found covered in urine and feces and surrounded by cockroaches/insects per EMS.  Altered with EMS, spontaneously moving her extremities but would not follow commands, got combative and was given Versed .  She did have similar admission in July of this year with unknown cause of encephalopathy, found to be hypotensive requiring vasopressors and subsequently discharged on midodrine .  She was noted to have severe MR, however, declined further cardiac workup.  Skilled nursing facility was recommended but patient had elected to be discharged back home with PT/OT.   Patient is currently AAO x 3 and answering questions appropriately.  States she has a lot of rugs at home.  She tripped over a rug and fell and did not have the energy to get up from the floor.  Unclear how long she was on the floor.  Denies any pain anywhere.  Reports having non-bloody diarrhea for the past few days but denies vomiting or abdominal pain.  Denies recent antibiotic use.  No other complaints.  Denies fevers, chills, cough, shortness of breath, or chest pain.  Denies any urinary  symptoms.  ED course:  Vital signs stable.  Labs notable for WBC count 18.5, hemoglobin 12.5, platelet count 334k, bicarb 20, glucose 99, BUN 26, creatinine 0.8, AST 86, ALT 75, alk phos 148, T. bili 0.9, VBG with pH 7.43 and pCO2 42.1, initial lactic acid 2.7 and repeat pending, initial troponin 51 and repeat pending, CK 696, magnesium  0.6, blood cultures in process, TSH normal, UA not suggestive of infection, UDS positive for benzodiazepines, ammonia level pending.  CT of L-spine showing acute to subacute L2 superior endplate compression fracture.  Chest x-ray showing no active disease.  X-rays of pelvis, left shoulder, bilateral knees, and CT scans of head/C-spine/T-spine/chest/abdomen/pelvis negative for acute traumatic injury.  EKG showing A-fib, no STEMI, and QTc 576.  Patient was given vancomycin , cefepime , metronidazole , 2 L IV fluids, and IV mag.  Review of Systems:  Review of Systems  All other systems reviewed and are negative.   Past Medical History:  Diagnosis Date   Anxiety    Depression    Hypothyroidism     Past Surgical History:  Procedure Laterality Date   CHOLECYSTECTOMY     CORONARY ARTERY BYPASS GRAFT N/A 01/27/2024   Procedure: CORONARY ARTERY BYPASS GRAFTING X 4, USING LEFT INTERNAL MAMMARY ARTERY AND RIGHT ENDOSCOPIC HARVESTED GREATER SAPHENOUS VEIN;  Surgeon: Kerrin Elspeth BROCKS, MD;  Location: [facility] OR;  Service: Open Heart Surgery;  Laterality: N/A;   IR THORACENTESIS ASP PLEURAL SPACE W/IMG GUIDE  02/07/2024   LEFT HEART CATH AND CORONARY ANGIOGRAPHY N/A 01/24/2024   Procedure: LEFT HEART CATH AND CORONARY ANGIOGRAPHY;  Surgeon: Ladona,  Gordy, MD;  Location: [facility] INVASIVE CV LAB;  Service: Cardiovascular;  Laterality: N/A;   TEE WITHOUT CARDIOVERSION N/A 01/27/2024   Procedure: ECHOCARDIOGRAM, TRANSESOPHAGEAL;  Surgeon: Kerrin Elspeth BROCKS, MD;  Location: [facility] OR;  Service: Open Heart Surgery;  Laterality: N/A;     reports that she has never smoked. She has never used  smokeless tobacco. She reports that she does not drink alcohol and does not use drugs.  Allergies  Allergen Reactions   Naprosyn  [Naproxen ]     Upset stomach    Latex Rash    Family History  Problem Relation Age of Onset   Stroke Father     Prior to Admission medications   Medication Sig Start Date End Date Taking? Authorizing Provider  acetaminophen  (TYLENOL ) 325 MG tablet Take 2 tablets (650 mg total) by mouth every 6 (six) hours as needed for mild pain (pain score 1-3). 04/16/24   Rosario Leatrice FERNS, MD  Ascorbic Acid (VITAMIN C) 100 MG tablet Take 100 mg by mouth daily. Patient not taking: Reported on 05/11/2024    [provider]  aspirin  EC 81 MG tablet Take 1 tablet (81 mg total) by mouth daily. Swallow whole. Patient not taking: Reported on 05/11/2024 02/15/24   Raguel Con RAMAN, PA-C  atorvastatin  (LIPITOR ) 80 MG tablet Take 1 tablet (80 mg total) by mouth daily at 6 PM. 03/28/24   Williams, Evan, PA-C  B Complex-C (B-COMPLEX WITH VITAMIN C) tablet Take 1 tablet by mouth daily.    [provider]  cholecalciferol  (VITAMIN D ) 1000 UNITS tablet Take 1,000 Units by mouth daily.    [provider]  Cinnamon 500 MG TABS Take 500 mg by mouth daily.    [provider]  clopidogrel  (PLAVIX ) 75 MG tablet Take 1 tablet (75 mg total) by mouth daily. 03/28/24   Trudy Birmingham, PA-C  Coenzyme Q10 (COQ-10) 100 MG CAPS Take 100 mg by mouth daily.    [provider]  cyclobenzaprine (FLEXERIL) 5 MG tablet 1 tablet at bedtime as needed Orally Once a day As needed    [provider]  escitalopram (LEXAPRO) 10 MG tablet Take 10 mg by mouth daily. 03/06/24   [provider]  feeding supplement (ENSURE PLUS HIGH PROTEIN) LIQD Take 237 mLs by mouth 2 (two) times daily between meals. 04/17/24 06/16/24  Rosario Leatrice I, MD  Flaxseed, Linseed, (FLAXSEED OIL) 1000 MG CAPS Take 1,000 mg by mouth daily. Patient not taking: Reported on 05/11/2024     [provider]  Garlic 100 MG TABS Take 100 mg by mouth daily.    [provider]  hydrOXYzine  (ATARAX ) 10 MG tablet 1 tablet Orally Once a day at bedtime; Duration: 30 days 04/23/24   [provider]  Anselm Oil 300 MG CAPS Take 300 mg by mouth daily.    [provider]  latanoprost  (XALATAN ) 0.005 % ophthalmic solution Place 1 drop into both eyes at bedtime. 09/16/23   [provider]  levothyroxine  (SYNTHROID ) 88 MCG tablet Take 1 tablet (88 mcg total) by mouth daily. 03/26/24   Trudy Birmingham, PA-C  metoprolol  succinate (TOPROL -XL) 25 MG 24 hr tablet Take 25 mg by mouth daily.    [provider]  ondansetron  (ZOFRAN ) 4 MG tablet Take 4 mg by mouth every 8 (eight) hours as needed for nausea or vomiting.    [provider]  pantoprazole  (PROTONIX ) 40 MG tablet Take 1 tablet (40 mg total) by mouth daily. 03/28/24   Trudy Birmingham,  PA-C  polyethylene glycol powder (GLYCOLAX /MIRALAX ) 17 GM/SCOOP powder Take 17 g by mouth daily as needed for moderate constipation. 04/16/24   Rosario Leatrice FERNS, MD    Physical Exam: Vitals:   06/02/24 2110 06/02/24 2111 06/02/24 2120 06/02/24 2345  BP:   130/86 139/68  Pulse:   83 84  Resp: 19  18 18   Temp: (!) 97.3 F (36.3 C)     TempSrc: Axillary     SpO2:   100% 99%  Weight:  49.9 kg      Physical Exam Vitals reviewed.  Constitutional:      General: She is not in acute distress.    Comments: Covered in feces  HENT:     Head: Normocephalic and atraumatic.     Mouth/Throat:     Mouth: Mucous membranes are dry.     Comments: Very dry mucous membranes Eyes:     Extraocular Movements: Extraocular movements intact.  Cardiovascular:     Rate and Rhythm: Normal rate and regular rhythm.     Heart sounds: Murmur heard.  Pulmonary:     Effort: Pulmonary effort is normal. No respiratory distress.     Breath sounds: Normal breath sounds. No stridor. No wheezing, rhonchi or rales.  Abdominal:      General: Bowel sounds are normal. There is no distension.     Palpations: Abdomen is soft.     Tenderness: There is no abdominal tenderness. There is no guarding.  Musculoskeletal:     Cervical back: Normal range of motion.     Right lower leg: No edema.     Left lower leg: No edema.  Skin:    General: Skin is warm and dry.  Neurological:     General: No focal deficit present.     Mental Status: She is alert and oriented to person, place, and time.     Cranial Nerves: No cranial nerve deficit.     Sensory: No sensory deficit.     Motor: No weakness.     Labs on Admission: I have personally reviewed following labs and imaging studies  CBC: Recent Labs  Lab 06/02/24 2138 06/02/24 2144 06/02/24 2341  WBC  --  7.9 18.5*  NEUTROABS  --  7.2 15.1*  HGB 13.9  13.9 5.0* 12.5  HCT 41.0  41.0 16.8* 38.1  MCV  --  104.3* 94.5  PLT  --  148* 334   Basic Metabolic Panel: Recent Labs  Lab 06/02/24 2138 06/02/24 2144  NA 144  144 143  K 3.9  3.9 3.9  CL 106 103  CO2  --  20*  GLUCOSE 106* 99  BUN 37* 26*  CREATININE 0.60 0.83  CALCIUM   --  9.1  MG  --  0.6*   GFR: Estimated Creatinine Clearance: 42.6 mL/min (by C-G formula based on SCr of 0.83 mg/dL). Liver Function Tests: Recent Labs  Lab 06/02/24 2144  AST 86*  ALT 75*  ALKPHOS 148*  BILITOT 0.9  PROT 6.1*  ALBUMIN  3.1*   Recent Labs  Lab 06/02/24 2144  LIPASE 16   Recent Labs  Lab 06/02/24 2341  AMMONIA 18   Coagulation Profile: No results for input(s): INR, PROTIME in the last 168 hours. Cardiac Enzymes: Recent Labs  Lab 06/02/24 2144  CKTOTAL 696*   BNP (last 3 results) No results for input(s): PROBNP in the last 8760 hours. HbA1C: No results for input(s): HGBA1C in the last 72 hours. CBG: No results for input(s): GLUCAP in the  last 168 hours. Lipid Profile: No results for input(s): CHOL, HDL, LDLCALC, TRIG, CHOLHDL, LDLDIRECT in the last 72 hours. Thyroid   Function Tests: Recent Labs    06/02/24 2155  TSH 1.320   Anemia Panel: No results for input(s): VITAMINB12, FOLATE, FERRITIN, TIBC, IRON, RETICCTPCT in the last 72 hours. Urine analysis:    Component Value Date/Time   COLORURINE AMBER (A) 06/02/2024 2222   APPEARANCEUR CLOUDY (A) 06/02/2024 2222   LABSPEC 1.027 06/02/2024 2222   PHURINE 5.0 06/02/2024 2222   GLUCOSEU NEGATIVE 06/02/2024 2222   HGBUR LARGE (A) 06/02/2024 2222   BILIRUBINUR SMALL (A) 06/02/2024 2222   KETONESUR 20 (A) 06/02/2024 2222   PROTEINUR >=300 (A) 06/02/2024 2222   NITRITE NEGATIVE 06/02/2024 2222   LEUKOCYTESUR NEGATIVE 06/02/2024 2222    Radiological Exams on Admission: CT CHEST ABDOMEN PELVIS W CONTRAST Result Date: 06/02/2024 CLINICAL DATA:  Polytrauma, blunt fall on Plavix  from home (lives by self), found on the floor facedown by daughter who had to brwak into her house tonight EXAM: CT CHEST, ABDOMEN, AND PELVIS WITH CONTRAST TECHNIQUE: Multidetector CT imaging of the chest, abdomen and pelvis was performed following the standard protocol during bolus administration of intravenous contrast. RADIATION DOSE REDUCTION: This exam was performed according to the departmental dose-optimization program which includes automated exposure control, adjustment of the mA and/or kV according to patient size and/or use of iterative reconstruction technique. CONTRAST:  75mL OMNIPAQUE  IOHEXOL  350 MG/ML SOLN COMPARISON:  None Available. FINDINGS: CHEST: Cardiovascular: No aortic injury. The thoracic aorta is normal in caliber. The heart is normal in size. No significant pericardial effusion. Severe atherosclerotic plaque. Coronary artery calcifications status post coronary artery bypass graft. The main pulmonary artery is normal in caliber. No central or proximal segmental pulmonary embolus. Limited evaluation more distally due to timing of contrast. Mediastinum/Nodes: No pneumomediastinum. No mediastinal hematoma.  The esophagus is unremarkable. The thyroid  is unremarkable. The central airways are patent. No mediastinal, hilar, or axillary lymphadenopathy. Lungs/Pleura: No focal consolidation. No pulmonary nodule. No pulmonary mass. No pulmonary contusion or laceration. No pneumatocele formation. No pleural effusion. No pneumothorax. No hemothorax. Musculoskeletal/Chest wall: No chest wall mass. No acute rib or sternal fracture. Please see separately dictated CT thoracolumbar spine. ABDOMEN / PELVIS: Hepatobiliary: Not enlarged. Focal fatty infiltration along falciform ligament. No focal lesion. No laceration or subcapsular hematoma. Status post cholecystectomy.  No biliary ductal dilatation. Pancreas: Normal pancreatic contour. No main pancreatic duct dilatation. Spleen: Not enlarged. Scattered calcifications consistent with sequelae of prior granulomatous disease. No focal lesion. No laceration, subcapsular hematoma, or vascular injury. Adrenals/Urinary Tract: No nodularity bilaterally. Bilateral kidneys enhance symmetrically. No hydronephrosis. No contusion, laceration, or subcapsular hematoma. No injury to the vascular structures or collecting systems. No hydroureter. The urinary bladder is unremarkable. Stomach/Bowel: No small or large bowel wall thickening or dilatation. Stool throughout the majority of the colon. Colonic diverticulosis. The appendix is unremarkable. Vasculature/Lymphatics: Severe atherosclerotic plaque. No abdominal aorta or iliac aneurysm. No active contrast extravasation or pseudoaneurysm. No abdominal, pelvic, inguinal lymphadenopathy. Reproductive: Uterus is unremarkable.  No adnexal mass. Other: No simple free fluid ascites. No pneumoperitoneum. No hemoperitoneum. No mesenteric hematoma identified. No organized fluid collection. Musculoskeletal: No significant soft tissue hematoma. No acute pelvic fracture. Please see separately dictated CT thoracolumbar spine. Other ports and devices: None.  IMPRESSION: 1. No acute intrathoracic, intra-abdominal, intrapelvic traumatic injury. 2. Please see separately dictated CT thoracolumbar spine. 3. Aortic Atherosclerosis (ICD10-I70.0) and Emphysema (ICD10-J43.9). Electronically Signed   By: Morgane  Naveau M.D.  On: 06/02/2024 23:46   CT L-SPINE NO CHARGE Result Date: 06/02/2024 CLINICAL DATA:  Neck trauma, midline tenderness (Age [age]y) fall on Plavix  from home (lives by self), found on the floor facedown by daughter who had to brwak into her house tonight EXAM: CT CERVICAL, THORACIC, AND LUMBAR SPINE WITHOUT CONTRAST TECHNIQUE: Multidetector CT imaging of the cervical, thoracic and lumbar spine was performed without intravenous contrast. Multiplanar CT image reconstructions were also generated. RADIATION DOSE REDUCTION: This exam was performed according to the departmental dose-optimization program which includes automated exposure control, adjustment of the mA and/or kV according to patient size and/or use of iterative reconstruction technique. COMPARISON:  MRI cervical spine 04/10/2024. CT abdomen pelvis 03/14/2024 FINDINGS: CT CERVICAL SPINE FINDINGS Alignment: Normal. Skull base and vertebrae: Diffusely decreased bone density. Multilevel mild moderate degenerative changes of the spine. No associated severe osseous neural foraminal or central canal stenosis. No acute fracture. No aggressive appearing focal osseous lesion or focal pathologic process. Soft tissues and spinal canal: No prevertebral fluid or swelling. No visible canal hematoma. Upper chest: Biapical pleural/pulmonary scarring. Centrilobular emphysematous changes. Other: None. CT THORACIC SPINE FINDINGS Alignment: Normal. Vertebrae: Diffusely decreased bone density. Chronic stable T11 compression fracture. No acute fracture or focal pathologic process. Paraspinal and other soft tissues: Negative. Disc levels: Maintained. CT LUMBAR SPINE FINDINGS Segmentation: 5 lumbar type vertebrae. Alignment:  Normal. Vertebrae: Diffusely decreased bone density. Multilevel moderate degenerative changes of the spine. Acute to subacute L2 superior endplate compression fracture. Focal pathologic process. Paraspinal and other soft tissues: Negative. Disc levels: Intervertebral disc space narrowing at the L1-L2 level. Intervertebral disc space vacuum phenomenon at the L4-L5 L5-S1 levels. IMPRESSION: 1.  Acute to subacute L2 superior endplate compression fracture. 2. No acute displaced fracture or traumatic listhesis of the cervical and thoracic spine. Chronic stable T11 compression fracture. 3. Diffusely decreased bone density. Electronically Signed   By: Morgane  Naveau M.D.   On: 06/02/2024 23:38   CT Cervical Spine Wo Contrast Result Date: 06/02/2024 CLINICAL DATA:  Neck trauma, midline tenderness (Age [age]y) fall on Plavix  from home (lives by self), found on the floor facedown by daughter who had to brwak into her house tonight EXAM: CT CERVICAL, THORACIC, AND LUMBAR SPINE WITHOUT CONTRAST TECHNIQUE: Multidetector CT imaging of the cervical, thoracic and lumbar spine was performed without intravenous contrast. Multiplanar CT image reconstructions were also generated. RADIATION DOSE REDUCTION: This exam was performed according to the departmental dose-optimization program which includes automated exposure control, adjustment of the mA and/or kV according to patient size and/or use of iterative reconstruction technique. COMPARISON:  MRI cervical spine 04/10/2024. CT abdomen pelvis 03/14/2024 FINDINGS: CT CERVICAL SPINE FINDINGS Alignment: Normal. Skull base and vertebrae: Diffusely decreased bone density. Multilevel mild moderate degenerative changes of the spine. No associated severe osseous neural foraminal or central canal stenosis. No acute fracture. No aggressive appearing focal osseous lesion or focal pathologic process. Soft tissues and spinal canal: No prevertebral fluid or swelling. No visible canal hematoma.  Upper chest: Biapical pleural/pulmonary scarring. Centrilobular emphysematous changes. Other: None. CT THORACIC SPINE FINDINGS Alignment: Normal. Vertebrae: Diffusely decreased bone density. Chronic stable T11 compression fracture. No acute fracture or focal pathologic process. Paraspinal and other soft tissues: Negative. Disc levels: Maintained. CT LUMBAR SPINE FINDINGS Segmentation: 5 lumbar type vertebrae. Alignment: Normal. Vertebrae: Diffusely decreased bone density. Multilevel moderate degenerative changes of the spine. Acute to subacute L2 superior endplate compression fracture. Focal pathologic process. Paraspinal and other soft tissues: Negative. Disc levels: Intervertebral disc space narrowing at  the L1-L2 level. Intervertebral disc space vacuum phenomenon at the L4-L5 L5-S1 levels. IMPRESSION: 1.  Acute to subacute L2 superior endplate compression fracture. 2. No acute displaced fracture or traumatic listhesis of the cervical and thoracic spine. Chronic stable T11 compression fracture. 3. Diffusely decreased bone density. Electronically Signed   By: Morgane  Naveau M.D.   On: 06/02/2024 23:38   CT T-SPINE NO CHARGE Result Date: 06/02/2024 CLINICAL DATA:  Neck trauma, midline tenderness (Age [age]y) fall on Plavix  from home (lives by self), found on the floor facedown by daughter who had to brwak into her house tonight EXAM: CT CERVICAL, THORACIC, AND LUMBAR SPINE WITHOUT CONTRAST TECHNIQUE: Multidetector CT imaging of the cervical, thoracic and lumbar spine was performed without intravenous contrast. Multiplanar CT image reconstructions were also generated. RADIATION DOSE REDUCTION: This exam was performed according to the departmental dose-optimization program which includes automated exposure control, adjustment of the mA and/or kV according to patient size and/or use of iterative reconstruction technique. COMPARISON:  MRI cervical spine 04/10/2024. CT abdomen pelvis 03/14/2024 FINDINGS: CT CERVICAL  SPINE FINDINGS Alignment: Normal. Skull base and vertebrae: Diffusely decreased bone density. Multilevel mild moderate degenerative changes of the spine. No associated severe osseous neural foraminal or central canal stenosis. No acute fracture. No aggressive appearing focal osseous lesion or focal pathologic process. Soft tissues and spinal canal: No prevertebral fluid or swelling. No visible canal hematoma. Upper chest: Biapical pleural/pulmonary scarring. Centrilobular emphysematous changes. Other: None. CT THORACIC SPINE FINDINGS Alignment: Normal. Vertebrae: Diffusely decreased bone density. Chronic stable T11 compression fracture. No acute fracture or focal pathologic process. Paraspinal and other soft tissues: Negative. Disc levels: Maintained. CT LUMBAR SPINE FINDINGS Segmentation: 5 lumbar type vertebrae. Alignment: Normal. Vertebrae: Diffusely decreased bone density. Multilevel moderate degenerative changes of the spine. Acute to subacute L2 superior endplate compression fracture. Focal pathologic process. Paraspinal and other soft tissues: Negative. Disc levels: Intervertebral disc space narrowing at the L1-L2 level. Intervertebral disc space vacuum phenomenon at the L4-L5 L5-S1 levels. IMPRESSION: 1.  Acute to subacute L2 superior endplate compression fracture. 2. No acute displaced fracture or traumatic listhesis of the cervical and thoracic spine. Chronic stable T11 compression fracture. 3. Diffusely decreased bone density. Electronically Signed   By: Morgane  Naveau M.D.   On: 06/02/2024 23:38   CT Head Wo Contrast Result Date: 06/02/2024 CLINICAL DATA:  Head trauma, moderate-severe EXAM: CT HEAD WITHOUT CONTRAST TECHNIQUE: Contiguous axial images were obtained from the base of the skull through the vertex without intravenous contrast. RADIATION DOSE REDUCTION: This exam was performed according to the departmental dose-optimization program which includes automated exposure control, adjustment of  the mA and/or kV according to patient size and/or use of iterative reconstruction technique. COMPARISON:  None Available. FINDINGS: Brain: Patchy and confluent areas of decreased attenuation are noted throughout the deep and periventricular white matter of the cerebral hemispheres bilaterally, compatible with chronic microvascular ischemic disease. No evidence of large-territorial acute infarction. No parenchymal hemorrhage. No mass lesion. No extra-axial collection. No mass effect or midline shift. No hydrocephalus. Basilar cisterns are patent. Vascular: No hyperdense vessel. Atherosclerotic calcifications are present within the cavernous internal carotid arteries. Skull: No acute fracture or focal lesion. Sinuses/Orbits: Paranasal sinuses and mastoid air cells are clear. The orbits are unremarkable. Other: None. IMPRESSION: No acute intracranial abnormality. Electronically Signed   By: Morgane  Naveau M.D.   On: 06/02/2024 23:30   DG Shoulder Left Result Date: 06/02/2024 CLINICAL DATA:  fall EXAM: LEFT SHOULDER - 2+ VIEW COMPARISON:  None  Available. FINDINGS: There is no evidence of fracture or dislocation. There is no evidence of arthropathy or other focal bone abnormality. Soft tissues are unremarkable. IMPRESSION: Negative. Electronically Signed   By: Morgane  Naveau M.D.   On: 06/02/2024 21:58   DG Pelvis 1-2 Views Result Date: 06/02/2024 CLINICAL DATA:  fall EXAM: PELVIS - 1-2 VIEW COMPARISON:  X-ray pelvis 02/02/2023 FINDINGS: Limited evaluation due to overlapping osseous structures and overlying soft tissues. There is no evidence of pelvic fracture or diastasis. No acute displaced fracture or dislocation of either hips. No pelvic bone lesions are seen. IMPRESSION: Negative for acute traumatic injury. Electronically Signed   By: Morgane  Naveau M.D.   On: 06/02/2024 21:51   DG Knee 1-2 Views Right Result Date: 06/02/2024 CLINICAL DATA:  [id number] Fall [id number] EXAM: RIGHT KNEE - 1-2 VIEW COMPARISON:   None Available. FINDINGS: No evidence of fracture, dislocation, or joint effusion. Mild to moderate medial tibiofemoral joint degenerative changes. Soft tissues are unremarkable. Vascular clips overlie the medial proximal leg. IMPRESSION: No acute displaced fracture or dislocation. Electronically Signed   By: Morgane  Naveau M.D.   On: 06/02/2024 21:50   DG Knee 1-2 Views Left Result Date: 06/02/2024 CLINICAL DATA:  [id number] Fall [id number] EXAM: LEFT KNEE - 1-2 VIEW COMPARISON:  None Available. FINDINGS: No evidence of fracture, dislocation, or joint effusion. No evidence of arthropathy or other focal bone abnormality. Soft tissues are unremarkable. Likely phlebolith posterior to the knee joint. IMPRESSION: No acute displaced fracture or dislocation. Electronically Signed   By: Morgane  Naveau M.D.   On: 06/02/2024 21:50   DG Chest Portable 1 View Result Date: 06/02/2024 CLINICAL DATA:  fall EXAM: PORTABLE CHEST 1 VIEW COMPARISON:  Chest x-ray 04/06/2024 FINDINGS: The heart and mediastinal contours are unchanged. Atherosclerotic plaque. No focal consolidation. No pulmonary edema. No pleural effusion. No pneumothorax. No acute osseous abnormality.  Sternotomy wires are intact. IMPRESSION: 1. No active disease. 2.  Aortic Atherosclerosis (ICD10-I70.0). Electronically Signed   By: Morgane  Naveau M.D.   On: 06/02/2024 21:49    Assessment and Plan  Acute metabolic encephalopathy Dehydration likely contributing.  UDS positive for benzodiazepines but she did receive Versed  by EMS.  No benzodiazepines listed in her home medications.  TSH and ammonia level normal.  UA not suggestive of infection.  CT head negative for acute intracranial abnormality and no focal neurodeficit on exam.  Patient is currently AAO x 3 and answering questions appropriately.  Leukocytosis and lactic acidosis No fever, tachycardia, tachypnea, or hypotension.  Mild lactic acidosis could be due to dehydration but given leukocytosis, patient  was given broad-spectrum antibiotics in the ED although no infectious source identified on workup so far.  UA not suggestive of infection.  No obvious infectious source on CT scans of chest/abdomen/pelvis and entire spine.  No meningeal signs.  Check procalcitonin level.  Trend WBC count and lactate.  Follow-up blood cultures.  Will also add on urine culture.  Rhabdomyolysis Patient had a mechanical fall at home and reportedly on the floor for an extended period of time.  CK 696.  Creatinine stable.  Continue IV fluid hydration and trend CK.  Elevated liver enzymes In the setting of rhabdomyolysis. AST 86, ALT 75, alk phos 148, T. bili 0.9.  Continue to monitor LFTs.  Diarrhea No evidence of colitis on CT.  C. difficile PCR and GI pathogen panel, enteric precautions  Elevated troponin CAD status post CABG 01/27/2024 Troponin 51> 66 in the setting of fall/rhabdomyolysis.  EKG without  STEMI and patient is not endorsing anginal symptoms.  Continue to trend troponin. Hold statin given elevated liver enzymes/rhabdomyolysis.  Continue aspirin , Plavix , and metoprolol .  Hypomagnesemia QT prolongation Replace magnesium  and monitor labs.  Avoid QT prolonging drugs.  Acute to subacute L2 superior endplate compression fracture Secondary to a mechanical fall.  Patient is not endorsing any pain at this time and appears comfortable.  PT/OT eval.  Fall PT/OT eval, fall precautions.  TOC consulted for placement.  Paroxysmal A-fib Not on anticoagulation.  Continue metoprolol .  Hypothyroidism Continue Synthroid .  Severe mitral regurgitation Patient has previously declined further cardiac workup.  GERD Continue Protonix .  DVT prophylaxis: Lovenox  Code Status: Full Code (discussed with the patient) Family Communication: No family available at this time. Level of care: Progressive Care Unit Admission status: It is my clinical opinion that referral for OBSERVATION is reasonable and necessary in this  patient based on the above information provided. The aforementioned taken together are felt to place the patient at high risk for further clinical deterioration. However, it is anticipated that the patient may be medically stable for discharge from the hospital within 24 to 48 hours.  Editha Ram MD [facility]  If 7PM-7AM, please contact night-coverage [email or web address]  06/03/2024, 12:56 AM

## 2024-06-03 NOTE — ED Notes (Signed)
 Per ortho tech, patient will receive back brace upon arriving to inpatient room.

## 2024-06-04 DIAGNOSIS — G9341 Metabolic encephalopathy: Secondary | ICD-10-CM

## 2024-06-04 LAB — CBC
HCT: 32.6 % — ABNORMAL LOW (ref 36.0–46.0)
Hemoglobin: 10.6 g/dL — ABNORMAL LOW (ref 12.0–15.0)
MCH: 30.9 pg (ref 26.0–34.0)
MCHC: 32.5 g/dL (ref 30.0–36.0)
MCV: 95 fL (ref 80.0–100.0)
Platelets: 281 K/uL (ref 150–400)
RBC: 3.43 MIL/uL — ABNORMAL LOW (ref 3.87–5.11)
RDW: 18.6 % — ABNORMAL HIGH (ref 11.5–15.5)
WBC: 10.3 K/uL (ref 4.0–10.5)
nRBC: 0 % (ref 0.0–0.2)

## 2024-06-04 LAB — COMPREHENSIVE METABOLIC PANEL WITH GFR
ALT: 171 U/L — ABNORMAL HIGH (ref 0–44)
AST: 204 U/L — ABNORMAL HIGH (ref 15–41)
Albumin: 2.1 g/dL — ABNORMAL LOW (ref 3.5–5.0)
Alkaline Phosphatase: 101 U/L (ref 38–126)
Anion gap: 10 (ref 5–15)
BUN: 19 mg/dL (ref 8–23)
CO2: 23 mmol/L (ref 22–32)
Calcium: 8.1 mg/dL — ABNORMAL LOW (ref 8.9–10.3)
Chloride: 109 mmol/L (ref 98–111)
Creatinine, Ser: 1.07 mg/dL — ABNORMAL HIGH (ref 0.44–1.00)
GFR, Estimated: 53 mL/min — ABNORMAL LOW (ref >60)
Glucose, Bld: 82 mg/dL (ref 70–99)
Potassium: 4.2 mmol/L (ref 3.5–5.1)
Sodium: 142 mmol/L (ref 135–145)
Total Bilirubin: 0.7 mg/dL (ref 0.0–1.2)
Total Protein: 4.6 g/dL — ABNORMAL LOW (ref 6.5–8.1)

## 2024-06-04 LAB — URINE CULTURE: Culture: NO GROWTH

## 2024-06-04 LAB — GLUCOSE, CAPILLARY
Glucose-Capillary: 56 mg/dL — ABNORMAL LOW (ref 70–99)
Glucose-Capillary: 59 mg/dL — ABNORMAL LOW (ref 70–99)
Glucose-Capillary: 97 mg/dL (ref 70–99)
Glucose-Capillary: 78 mg/dL (ref 70–99)
Glucose-Capillary: 81 mg/dL (ref 70–99)

## 2024-06-04 LAB — VITAMIN D 25 HYDROXY (VIT D DEFICIENCY, FRACTURES): Vit D, 25-Hydroxy: 44.03 ng/mL (ref 30–100)

## 2024-06-04 LAB — CK: Total CK: 2377 U/L — ABNORMAL HIGH (ref 38–234)

## 2024-06-04 LAB — PHOSPHORUS: Phosphorus: 1.6 mg/dL — ABNORMAL LOW (ref 2.5–4.6)

## 2024-06-04 LAB — MAGNESIUM: Magnesium: 2 mg/dL (ref 1.7–2.4)

## 2024-06-04 MED ORDER — ALPRAZOLAM 0.5 MG PO TABS: 0.5000 mg | ORAL_TABLET | Freq: Two times a day (BID) | ORAL | Status: DC

## 2024-06-04 MED ORDER — ALPRAZOLAM 0.5 MG PO TABS
1.0000 mg | ORAL_TABLET | Freq: Two times a day (BID) | ORAL | Status: DC
  Administered 2024-06-04 – 2024-06-06 (×4): 1 mg via ORAL
  Filled 2024-06-04 (×5): qty 2

## 2024-06-04 MED ORDER — LACTATED RINGERS IV SOLN: INTRAVENOUS | Status: AC

## 2024-06-04 MED ORDER — QUETIAPINE FUMARATE 50 MG PO TABS
50.0000 mg | ORAL_TABLET | Freq: Two times a day (BID) | ORAL | Status: DC
  Administered 2024-06-04 – 2024-06-06 (×4): 50 mg via ORAL
  Filled 2024-06-04 (×5): qty 1

## 2024-06-04 MED ORDER — SODIUM CHLORIDE 0.9 % IV SOLN: INTRAVENOUS | Status: DC

## 2024-06-04 MED ORDER — SODIUM CHLORIDE 0.9 % IV SOLN
2.0000 g | INTRAVENOUS | Status: DC
  Administered 2024-06-04 – 2024-06-05 (×2): 2 g via INTRAVENOUS
  Filled 2024-06-04 (×2): qty 20

## 2024-06-04 MED ORDER — SODIUM PHOSPHATES 45 MMOLE/15ML IV SOLN
30.0000 mmol | Freq: Once | INTRAVENOUS | Status: AC
  Administered 2024-06-04: 30 mmol via INTRAVENOUS
  Filled 2024-06-04: qty 10

## 2024-06-04 MED ORDER — ESCITALOPRAM OXALATE 10 MG PO TABS
10.0000 mg | ORAL_TABLET | Freq: Every day | ORAL | Status: DC
  Administered 2024-06-05 – 2024-06-11 (×7): 10 mg via ORAL
  Filled 2024-06-04 (×7): qty 1

## 2024-06-04 MED ORDER — HYDROXYZINE HCL 10 MG PO TABS
10.0000 mg | ORAL_TABLET | Freq: Every day | ORAL | Status: DC
  Administered 2024-06-04 – 2024-06-10 (×6): 10 mg via ORAL
  Filled 2024-06-04 (×6): qty 1

## 2024-06-04 MED ORDER — HALOPERIDOL LACTATE 5 MG/ML IJ SOLN
2.0000 mg | Freq: Four times a day (QID) | INTRAMUSCULAR | Status: DC | PRN
  Administered 2024-06-05: 2 mg via INTRAMUSCULAR
  Filled 2024-06-04: qty 1

## 2024-06-04 NOTE — Progress Notes (Signed)
 PROGRESS NOTE                                                                                                                                                                                                             Patient Demographics:    Kaitlyn Wright, is a 78 y.o. female, DOB - 07/26/1946, FMW:[id number]  Outpatient Primary MD for the patient is Arloa Elsie SAUNDERS, MD    LOS - 1  Admit date - 06/02/2024    Chief Complaint  Patient presents with   Fall on Plavix    Code Sepsis       Brief Narrative (HPI from H&P)   78 y.o. female with medical history significant of CAD status post CABG 01/27/2024 on aspirin  and Plavix , complicated by postop paroxysmal A-fib not on anticoagulation, elevated LFTs leading to discontinuation of amiodarone  towards the end of June, hypothyroidism, severe mitral regurgitation, cervical spondylosis and multilevel foraminal stenosis, anxiety/depression presenting to the ED via EMS for evaluation of altered mental status.  Patient was found facedown on the ground at home by a family friend.  Reportedly family is estranged and has not talked to the patient for weeks.  Patient last spoke to a friend on Tuesday and nobody had heard from her since.  She was found covered in urine and feces and surrounded by cockroaches/insects per EMS.  Altered with EMS, spontaneously moving her extremities but would not follow commands, got combative and was given Versed .    She did have similar admission in July of this year with unknown cause of encephalopathy, found to be hypotensive requiring vasopressors and subsequently discharged on midodrine .  She was noted to have severe MR, however, declined further cardiac workup.  Skilled nursing facility was recommended but patient had elected to be discharged back home with PT/OT.    Subjective:    Rock Cummins today has, No headache, No chest pain, No abdominal pain - No Nausea,  No new weakness tingling or numbness, no SOB   Assessment  & Plan :   Fall, dehydration, rhabdomyolysis, multiple skin lacerations and abrasions.  Acute metabolic encephalopathy  Dehydration likely contributing.  UDS positive for benzodiazepines, was noted to be abusing benzodiazepines in the hospital, hydrate, supportive care, PT OT and monitor.  Follow cultures closely.  UA stable, head CT stable, no headache or focal deficits, no  aches or pains.  Continue to monitor with IV fluids.   Leukocytosis and lactic acidosis  No fever, tachycardia, tachypnea, or hypotension.  Mild lactic acidosis could be due to dehydration but given leukocytosis, does not appear toxic, taper down antibiotics.  Blood culture appears showing Staph epidermidis but 2 out of 2 positive, will discuss with ID about her cultures and GI pathogen panel.   Rhabdomyolysis  Patient had a mechanical fall at home and reportedly on the floor for an extended period of time.  CK 696.  Creatinine stable.  Continue IV fluid hydration and trend CK.   Diarrhea GI pathogen panel positive for enteropathogenic E. coli, being hydrated, will discuss with ID.   Elevated troponin, CAD status post CABG 01/27/2024  Troponin 51> 66 in the setting of fall/rhabdomyolysis.  EKG without STEMI and patient is not endorsing anginal symptoms.  Flat and low troponin in non-ACS pattern.  Remains symptom-free, continue aspirin , Plavix , and metoprolol .  Will resume statin once LFTs have stabilized.   Elevated liver enzymes  In the setting of rhabdomyolysis. AST 86, ALT 75, alk phos 148, T. bili 0.9.  Continue to monitor LFTs.     Hypomagnesemia, hypophosphatemia QT prolongation  Replace magnesium  and phosphorus and monitor labs.  Monitor on telemetry.   Acute to subacute L2 superior endplate compression fracture, chronic T11 fracture.  Secondary to a mechanical fall.  Patient is not endorsing any pain at this time and appears comfortable.  PT/OT eval. TLSO brace.   Will discuss with neurosurgery do not think she needs surgical intervention   Fall  PT/OT eval, fall precautions.  TOC consulted for placement.   Paroxysmal A-fib  Not on anticoagulation.  Continue metoprolol .   Hypothyroidism  Continue Synthroid .   Severe mitral regurgitation  Patient has previously declined further cardiac workup.   GERD  Continue Protonix .   Patient Lines/Drains/Airways Status     Active Line/Drains/Airways     Name Placement date Placement time Site Days   Peripheral IV 06/02/24 20 G Posterior;Right Hand 06/02/24  --  Hand  2   Peripheral IV 06/02/24 22 G 1.75 Anterior;Right Forearm 06/02/24  2152  Forearm  2   External Urinary Catheter 06/03/24  1300  --  1   Wound 04/11/24 1900 Traumatic Back Left;Upper 04/11/24  1900  Back  54   Wound 06/03/24 1051 Traumatic Face Right 06/03/24  1051  Face  1   Wound 06/03/24 1053 Traumatic Face Medial 06/03/24  1053  Face  1   Wound 06/03/24 1054 Traumatic Knee Right 06/03/24  1054  Knee  1   Wound 06/03/24 1102 Knee Left 06/03/24  1102  Knee  1                     Condition -  Guarded  Family Communication  : Daughter [name] [phone]  on 06/04/2024 at 10:10 AM message left.  Code Status :  Full  Consults  :    PUD Prophylaxis :     Procedures  :     CT head - non acute  CT chest abdomen pelvis.  Nonacute.  CT T and L-spine.  1.  Acute to subacute L2 superior endplate compression fracture. 2. No acute displaced fracture or traumatic listhesis of the cervical and thoracic spine. Chronic stable T11 compression fracture. 3. Diffusely decreased bone density.      Disposition Plan  :    Status is: Inpatient   DVT Prophylaxis  :  enoxaparin  (LOVENOX ) injection 40 mg Start: 06/04/24 1400    Lab Results  Component Value Date   PLT 281 06/04/2024    Diet :  Diet Order             Diet Heart Room service appropriate? No; Fluid consistency: Thin  Diet effective now                     Inpatient Medications  Scheduled Meds:  clopidogrel   75 mg Oral Daily   enoxaparin  (LOVENOX ) injection  40 mg Subcutaneous Q24H   Influenza vac split trivalent PF  0.5 mL Intramuscular Tomorrow-1000   levothyroxine   88 mcg Oral Q0600   pantoprazole   40 mg Oral Daily   pneumococcal 20-valent conjugate vaccine  0.5 mL Intramuscular Tomorrow-1000   Continuous Infusions:  sodium chloride  50 mL/hr at 06/04/24 0546   cefTRIAXone  (ROCEPHIN )  IV Stopped (06/04/24 0219)   sodium PHOSPHATE  IVPB (in mmol) 30 mmol (06/04/24 0627)   PRN Meds:.glucagon  (human recombinant), hydrALAZINE , ipratropium-albuterol , metoprolol  tartrate  Antibiotics  :    Anti-infectives (From admission, onward)    Start     Dose/Rate Route Frequency Ordered Stop   06/04/24 0100  cefTRIAXone  (ROCEPHIN ) 2 g in sodium chloride  0.9 % 100 mL IVPB        2 g 200 mL/hr over 30 Minutes Intravenous Every 24 hours 06/04/24 0027     06/03/24 0015  ceFEPIme  (MAXIPIME ) 2 g in sodium chloride  0.9 % 100 mL IVPB        2 g 200 mL/hr over 30 Minutes Intravenous  Once 06/03/24 0010 06/03/24 0057   06/03/24 0015  metroNIDAZOLE  (FLAGYL ) IVPB 500 mg        500 mg 100 mL/hr over 60 Minutes Intravenous  Once 06/03/24 0010 06/03/24 0202   06/03/24 0015  vancomycin  (VANCOCIN ) IVPB 1000 mg/200 mL premix        1,000 mg 200 mL/hr over 60 Minutes Intravenous  Once 06/03/24 0010 06/03/24 0228         Objective:   Vitals:   06/04/24 0029 06/04/24 0400 06/04/24 0426 06/04/24 0904  BP: 97/70 (!) 109/58 (!) 90/59   Pulse:    78  Resp:    16  Temp: 98.3 F (36.8 C)  97.7 F (36.5 C)   TempSrc: Oral  Oral   SpO2:    99%  Weight:      Height:        Wt Readings from Last 3 Encounters:  06/03/24 52.5 kg  05/11/24 49.9 kg  04/16/24 49.5 kg     Intake/Output Summary (Last 24 hours) at 06/04/2024 1042 Last data filed at 06/04/2024 0548 Gross per 24 hour  Intake 905.53 ml  Output 200 ml  Net 705.53 ml     Physical  Exam  Awake, mildly confused, No new F.N deficits, Normal affect [place].AT,PERRAL Supple Neck, No JVD,   Symmetrical Chest wall movement, Good air movement bilaterally, CTAB RRR,No Gallops,Rubs or new Murmurs,  +ve B.Sounds, Abd Soft, No tenderness,   Multiple skin abrasions including under the chin and right side of the face looks like pressure injury       Data Review:    Recent Labs  Lab 06/02/24 2138 06/02/24 2144 06/02/24 2341 06/03/24 1213 06/04/24 0451  WBC  --  7.9 18.5* 18.6* 10.3  HGB 13.9  13.9 5.0* 12.5 11.4* 10.6*  HCT 41.0  41.0 16.8* 38.1 35.7* 32.6*  PLT  --  148* 334 327  281  MCV  --  104.3* 94.5 96.5 95.0  MCH  --  31.1 31.0 30.8 30.9  MCHC  --  29.8* 32.8 31.9 32.5  RDW  --  18.4* 18.6* 18.5* 18.6*  LYMPHSABS  --  0.4* 1.1  --   --   MONOABS  --  0.3 1.1*  --   --   EOSABS  --  0.0 1.1*  --   --   BASOSABS  --  0.0 0.1  --   --     Recent Labs  Lab 06/02/24 2138 06/02/24 2144 06/02/24 2155 06/02/24 2341 06/03/24 0127 06/03/24 0259 06/03/24 0351 06/03/24 1213 06/04/24 0451  NA 144  144 143  --   --   --  SPECIMEN CONTAMINATED, UNABLE TO PERFORM TEST(S). 142 135 142  K 3.9  3.9 3.9  --   --   --  SPECIMEN CONTAMINATED, UNABLE TO PERFORM TEST(S). 2.3* 4.5 4.2  CL 106 103  --   --   --  SPECIMEN CONTAMINATED, UNABLE TO PERFORM TEST(S). 113* 106 109  CO2  --  20*  --   --   --  SPECIMEN CONTAMINATED, UNABLE TO PERFORM TEST(S). 20* 23 23  ANIONGAP  --  20*  --   --   --  SPECIMEN CONTAMINATED, UNABLE TO PERFORM TEST(S). 9 6 10   GLUCOSE 106* 99  --   --   --  SPECIMEN CONTAMINATED, UNABLE TO PERFORM TEST(S). 96 91 82  BUN 37* 26*  --   --   --  SPECIMEN CONTAMINATED, UNABLE TO PERFORM TEST(S). 18 17 19   CREATININE 0.60 0.83  --   --   --  SPECIMEN CONTAMINATED, UNABLE TO PERFORM TEST(S). 0.60 0.78 1.07*  AST  --  86*  --   --   --  SPECIMEN CONTAMINATED, UNABLE TO PERFORM TEST(S). 191*  --  204*  ALT  --  75*  --   --   --  SPECIMEN CONTAMINATED,  UNABLE TO PERFORM TEST(S). 166*  --  171*  ALKPHOS  --  148*  --   --   --  SPECIMEN CONTAMINATED, UNABLE TO PERFORM TEST(S). 83  --  101  BILITOT  --  0.9  --   --   --  SPECIMEN CONTAMINATED, UNABLE TO PERFORM TEST(S). 1.5*  --  0.7  ALBUMIN   --  3.1*  --   --   --  SPECIMEN CONTAMINATED, UNABLE TO PERFORM TEST(S). 2.0*  --  2.1*  PROCALCITON  --   --   --   --   --   --  0.11  --   --   LATICACIDVEN 2.7*  --   --   --  1.0  --   --   --   --   TSH  --   --  1.320  --   --   --   --   --   --   AMMONIA  --   --   --  18  --   --   --   --   --   MG  --  0.6*  --   --   --   --  2.9* 2.8* 2.0  PHOS  --   --   --   --   --   --   --  1.4* 1.6*  CALCIUM   --  9.1  --   --   --  SPECIMEN CONTAMINATED, UNABLE TO  PERFORM TEST(S). 6.7* 8.3* 8.1*      Recent Labs  Lab 06/02/24 2138 06/02/24 2144 06/02/24 2155 06/02/24 2341 06/03/24 0127 06/03/24 0259 06/03/24 0351 06/03/24 1213 06/04/24 0451  PROCALCITON  --   --   --   --   --   --  0.11  --   --   LATICACIDVEN 2.7*  --   --   --  1.0  --   --   --   --   TSH  --   --  1.320  --   --   --   --   --   --   AMMONIA  --   --   --  18  --   --   --   --   --   MG  --  0.6*  --   --   --   --  2.9* 2.8* 2.0  CALCIUM   --  9.1  --   --   --  SPECIMEN CONTAMINATED, UNABLE TO PERFORM TEST(S). 6.7* 8.3* 8.1*    --------------------------------------------------------------------------------------------------------------- Lab Results  Component Value Date   CHOL 237 (H) 01/24/2024   HDL 62 01/24/2024   LDLCALC 157 (H) 01/24/2024   TRIG 92 01/24/2024   CHOLHDL 3.8 01/24/2024    Lab Results  Component Value Date   HGBA1C 5.6 01/24/2024   Recent Labs    06/02/24 2155  TSH 1.320   No results for input(s): VITAMINB12, FOLATE, FERRITIN, TIBC, IRON, RETICCTPCT in the last 72 hours. ------------------------------------------------------------------------------------------------------------------ Cardiac Enzymes No results  for input(s): CKMB, TROPONINI, MYOGLOBIN in the last 168 hours.  Invalid input(s): CK  Micro Results Recent Results (from the past 240 hours)  Blood culture (routine x 2)     Status: Abnormal (Preliminary result)   Collection Time: 06/02/24  9:55 PM   Specimen: BLOOD RIGHT FOREARM  Result Value Ref Range Status   Specimen Description BLOOD RIGHT FOREARM  Final   Special Requests   Final    BOTTLES DRAWN AEROBIC AND ANAEROBIC Blood Culture adequate volume   Culture  Setup Time   Final    GRAM POSITIVE COCCI ANAEROBIC BOTTLE ONLY CRITICAL RESULT CALLED TO, READ BACK BY AND VERIFIED WITH: PHARMD J CARNEY 06/03/2024 @ 2136 BY AB Performed at [facility], [address] [place], [place] [address]    Culture STAPHYLOCOCCUS EPIDERMIDIS (A)  Final   Report Status PENDING  Incomplete  Blood culture (routine x 2)     Status: None (Preliminary result)   Collection Time: 06/02/24  9:55 PM   Specimen: BLOOD RIGHT ARM  Result Value Ref Range Status   Specimen Description BLOOD RIGHT ARM  Final   Special Requests   Final    BOTTLES DRAWN AEROBIC ONLY Blood Culture adequate volume   Culture  Setup Time   Final    GRAM POSITIVE COCCI IN CLUSTERS AEROBIC BOTTLE ONLY CRITICAL VALUE NOTED.  VALUE IS CONSISTENT WITH PREVIOUSLY REPORTED AND CALLED VALUE. Performed at [facility], [address] [place], [place] [address]    Culture GRAM POSITIVE COCCI  Final   Report Status PENDING  Incomplete  Blood Culture ID Panel (Reflexed)     Status: Abnormal   Collection Time: 06/02/24  9:55 PM  Result Value Ref Range Status   Enterococcus faecalis NOT DETECTED NOT DETECTED Final   Enterococcus Faecium NOT DETECTED NOT DETECTED Final   Listeria monocytogenes NOT DETECTED NOT DETECTED Final   Staphylococcus species  DETECTED (A) NOT DETECTED Final    Comment: CRITICAL RESULT CALLED TO, READ BACK BY AND VERIFIED WITH: PHARMD J CARNEY 06/03/2024 @ 2136 BY AB    Staphylococcus aureus  (BCID) NOT DETECTED NOT DETECTED Final   Staphylococcus epidermidis DETECTED (A) NOT DETECTED Final    Comment: CRITICAL RESULT CALLED TO, READ BACK BY AND VERIFIED WITH: PHARMD J CARNEY 06/03/2024 @ 2136 BY AB    Staphylococcus lugdunensis NOT DETECTED NOT DETECTED Final   Streptococcus species NOT DETECTED NOT DETECTED Final   Streptococcus agalactiae NOT DETECTED NOT DETECTED Final   Streptococcus pneumoniae NOT DETECTED NOT DETECTED Final   Streptococcus pyogenes NOT DETECTED NOT DETECTED Final   A.calcoaceticus-baumannii NOT DETECTED NOT DETECTED Final   Bacteroides fragilis NOT DETECTED NOT DETECTED Final   Enterobacterales NOT DETECTED NOT DETECTED Final   Enterobacter cloacae complex NOT DETECTED NOT DETECTED Final   Escherichia coli NOT DETECTED NOT DETECTED Final   Klebsiella aerogenes NOT DETECTED NOT DETECTED Final   Klebsiella oxytoca NOT DETECTED NOT DETECTED Final   Klebsiella pneumoniae NOT DETECTED NOT DETECTED Final   Proteus species NOT DETECTED NOT DETECTED Final   Salmonella species NOT DETECTED NOT DETECTED Final   Serratia marcescens NOT DETECTED NOT DETECTED Final   Haemophilus influenzae NOT DETECTED NOT DETECTED Final   Neisseria meningitidis NOT DETECTED NOT DETECTED Final   Pseudomonas aeruginosa NOT DETECTED NOT DETECTED Final   Stenotrophomonas maltophilia NOT DETECTED NOT DETECTED Final   Candida albicans NOT DETECTED NOT DETECTED Final   Candida auris NOT DETECTED NOT DETECTED Final   Candida glabrata NOT DETECTED NOT DETECTED Final   Candida krusei NOT DETECTED NOT DETECTED Final   Candida parapsilosis NOT DETECTED NOT DETECTED Final   Candida tropicalis NOT DETECTED NOT DETECTED Final   Cryptococcus neoformans/gattii NOT DETECTED NOT DETECTED Final   Methicillin resistance mecA/C NOT DETECTED NOT DETECTED Final    Comment: Performed at [facility], [address], [place], [place] [address]  C Difficile Quick Screen w PCR reflex     Status:  None   Collection Time: 06/03/24  1:36 AM   Specimen: STOOL  Result Value Ref Range Status   C Diff antigen NEGATIVE NEGATIVE Final   C Diff toxin NEGATIVE NEGATIVE Final   C Diff interpretation No C. difficile detected.  Final    Comment: Performed at [facility], [address] [place], [place] [address]  Gastrointestinal Panel by PCR , Stool     Status: Abnormal   Collection Time: 06/03/24  1:36 AM   Specimen: Stool  Result Value Ref Range Status   Campylobacter species NOT DETECTED NOT DETECTED Final   Plesimonas shigelloides NOT DETECTED NOT DETECTED Final   Salmonella species NOT DETECTED NOT DETECTED Final   Yersinia enterocolitica NOT DETECTED NOT DETECTED Final   Vibrio species NOT DETECTED NOT DETECTED Final   Vibrio cholerae NOT DETECTED NOT DETECTED Final   Enteroaggregative E coli (EAEC) NOT DETECTED NOT DETECTED Final   Enteropathogenic E coli (EPEC) DETECTED (A) NOT DETECTED Final    Comment: RESULT CALLED TO, READ BACK BY AND VERIFIED WITH: Fidelis SCHAPYE 06/03/24 2314 KLW    Enterotoxigenic E coli (ETEC) NOT DETECTED NOT DETECTED Final   Shiga like toxin producing E coli (STEC) NOT DETECTED NOT DETECTED Final   Shigella/Enteroinvasive E coli (EIEC) NOT DETECTED NOT DETECTED Final   Cryptosporidium NOT DETECTED NOT DETECTED Final   Cyclospora cayetanensis NOT DETECTED NOT DETECTED Final   Entamoeba histolytica  NOT DETECTED NOT DETECTED Final   Giardia lamblia NOT DETECTED NOT DETECTED Final   Adenovirus F40/41 NOT DETECTED NOT DETECTED Final   Astrovirus NOT DETECTED NOT DETECTED Final   Norovirus GI/GII NOT DETECTED NOT DETECTED Final   Rotavirus A NOT DETECTED NOT DETECTED Final   Sapovirus (I, II, IV, and V) NOT DETECTED NOT DETECTED Final    Comment: Performed at [facility], [address], [place], [place] [address]    Radiology Report CT CHEST ABDOMEN PELVIS W CONTRAST Result Date: 06/02/2024 CLINICAL DATA:  Polytrauma, blunt fall on  Plavix  from home (lives by self), found on the floor facedown by daughter who had to brwak into her house tonight EXAM: CT CHEST, ABDOMEN, AND PELVIS WITH CONTRAST TECHNIQUE: Multidetector CT imaging of the chest, abdomen and pelvis was performed following the standard protocol during bolus administration of intravenous contrast. RADIATION DOSE REDUCTION: This exam was performed according to the departmental dose-optimization program which includes automated exposure control, adjustment of the mA and/or kV according to patient size and/or use of iterative reconstruction technique. CONTRAST:  75mL OMNIPAQUE  IOHEXOL  350 MG/ML SOLN COMPARISON:  None Available. FINDINGS: CHEST: Cardiovascular: No aortic injury. The thoracic aorta is normal in caliber. The heart is normal in size. No significant pericardial effusion. Severe atherosclerotic plaque. Coronary artery calcifications status post coronary artery bypass graft. The main pulmonary artery is normal in caliber. No central or proximal segmental pulmonary embolus. Limited evaluation more distally due to timing of contrast. Mediastinum/Nodes: No pneumomediastinum. No mediastinal hematoma. The esophagus is unremarkable. The thyroid  is unremarkable. The central airways are patent. No mediastinal, hilar, or axillary lymphadenopathy. Lungs/Pleura: No focal consolidation. No pulmonary nodule. No pulmonary mass. No pulmonary contusion or laceration. No pneumatocele formation. No pleural effusion. No pneumothorax. No hemothorax. Musculoskeletal/Chest wall: No chest wall mass. No acute rib or sternal fracture. Please see separately dictated CT thoracolumbar spine. ABDOMEN / PELVIS: Hepatobiliary: Not enlarged. Focal fatty infiltration along falciform ligament. No focal lesion. No laceration or subcapsular hematoma. Status post cholecystectomy.  No biliary ductal dilatation. Pancreas: Normal pancreatic contour. No main pancreatic duct dilatation. Spleen: Not enlarged. Scattered  calcifications consistent with sequelae of prior granulomatous disease. No focal lesion. No laceration, subcapsular hematoma, or vascular injury. Adrenals/Urinary Tract: No nodularity bilaterally. Bilateral kidneys enhance symmetrically. No hydronephrosis. No contusion, laceration, or subcapsular hematoma. No injury to the vascular structures or collecting systems. No hydroureter. The urinary bladder is unremarkable. Stomach/Bowel: No small or large bowel wall thickening or dilatation. Stool throughout the majority of the colon. Colonic diverticulosis. The appendix is unremarkable. Vasculature/Lymphatics: Severe atherosclerotic plaque. No abdominal aorta or iliac aneurysm. No active contrast extravasation or pseudoaneurysm. No abdominal, pelvic, inguinal lymphadenopathy. Reproductive: Uterus is unremarkable.  No adnexal mass. Other: No simple free fluid ascites. No pneumoperitoneum. No hemoperitoneum. No mesenteric hematoma identified. No organized fluid collection. Musculoskeletal: No significant soft tissue hematoma. No acute pelvic fracture. Please see separately dictated CT thoracolumbar spine. Other ports and devices: None. IMPRESSION: 1. No acute intrathoracic, intra-abdominal, intrapelvic traumatic injury. 2. Please see separately dictated CT thoracolumbar spine. 3. Aortic Atherosclerosis (ICD10-I70.0) and Emphysema (ICD10-J43.9). Electronically Signed   By: Morgane  Naveau M.D.   On: 06/02/2024 23:46   CT L-SPINE NO CHARGE Result Date: 06/02/2024 CLINICAL DATA:  Neck trauma, midline tenderness (Age 32-64y) fall on Plavix  from home (lives by self), found on the floor facedown by daughter who had to brwak into her house tonight EXAM: CT CERVICAL, THORACIC, AND LUMBAR SPINE WITHOUT CONTRAST TECHNIQUE: Multidetector  CT imaging of the cervical, thoracic and lumbar spine was performed without intravenous contrast. Multiplanar CT image reconstructions were also generated. RADIATION DOSE REDUCTION: This exam was  performed according to the departmental dose-optimization program which includes automated exposure control, adjustment of the mA and/or kV according to patient size and/or use of iterative reconstruction technique. COMPARISON:  MRI cervical spine 04/10/2024. CT abdomen pelvis 03/14/2024 FINDINGS: CT CERVICAL SPINE FINDINGS Alignment: Normal. Skull base and vertebrae: Diffusely decreased bone density. Multilevel mild moderate degenerative changes of the spine. No associated severe osseous neural foraminal or central canal stenosis. No acute fracture. No aggressive appearing focal osseous lesion or focal pathologic process. Soft tissues and spinal canal: No prevertebral fluid or swelling. No visible canal hematoma. Upper chest: Biapical pleural/pulmonary scarring. Centrilobular emphysematous changes. Other: None. CT THORACIC SPINE FINDINGS Alignment: Normal. Vertebrae: Diffusely decreased bone density. Chronic stable T11 compression fracture. No acute fracture or focal pathologic process. Paraspinal and other soft tissues: Negative. Disc levels: Maintained. CT LUMBAR SPINE FINDINGS Segmentation: 5 lumbar type vertebrae. Alignment: Normal. Vertebrae: Diffusely decreased bone density. Multilevel moderate degenerative changes of the spine. Acute to subacute L2 superior endplate compression fracture. Focal pathologic process. Paraspinal and other soft tissues: Negative. Disc levels: Intervertebral disc space narrowing at the L1-L2 level. Intervertebral disc space vacuum phenomenon at the L4-L5 L5-S1 levels. IMPRESSION: 1.  Acute to subacute L2 superior endplate compression fracture. 2. No acute displaced fracture or traumatic listhesis of the cervical and thoracic spine. Chronic stable T11 compression fracture. 3. Diffusely decreased bone density. Electronically Signed   By: Morgane  Naveau M.D.   On: 06/02/2024 23:38   CT Cervical Spine Wo Contrast Result Date: 06/02/2024 CLINICAL DATA:  Neck trauma, midline  tenderness (Age [age]y) fall on Plavix  from home (lives by self), found on the floor facedown by daughter who had to brwak into her house tonight EXAM: CT CERVICAL, THORACIC, AND LUMBAR SPINE WITHOUT CONTRAST TECHNIQUE: Multidetector CT imaging of the cervical, thoracic and lumbar spine was performed without intravenous contrast. Multiplanar CT image reconstructions were also generated. RADIATION DOSE REDUCTION: This exam was performed according to the departmental dose-optimization program which includes automated exposure control, adjustment of the mA and/or kV according to patient size and/or use of iterative reconstruction technique. COMPARISON:  MRI cervical spine 04/10/2024. CT abdomen pelvis 03/14/2024 FINDINGS: CT CERVICAL SPINE FINDINGS Alignment: Normal. Skull base and vertebrae: Diffusely decreased bone density. Multilevel mild moderate degenerative changes of the spine. No associated severe osseous neural foraminal or central canal stenosis. No acute fracture. No aggressive appearing focal osseous lesion or focal pathologic process. Soft tissues and spinal canal: No prevertebral fluid or swelling. No visible canal hematoma. Upper chest: Biapical pleural/pulmonary scarring. Centrilobular emphysematous changes. Other: None. CT THORACIC SPINE FINDINGS Alignment: Normal. Vertebrae: Diffusely decreased bone density. Chronic stable T11 compression fracture. No acute fracture or focal pathologic process. Paraspinal and other soft tissues: Negative. Disc levels: Maintained. CT LUMBAR SPINE FINDINGS Segmentation: 5 lumbar type vertebrae. Alignment: Normal. Vertebrae: Diffusely decreased bone density. Multilevel moderate degenerative changes of the spine. Acute to subacute L2 superior endplate compression fracture. Focal pathologic process. Paraspinal and other soft tissues: Negative. Disc levels: Intervertebral disc space narrowing at the L1-L2 level. Intervertebral disc space vacuum phenomenon at the L4-L5 L5-S1  levels. IMPRESSION: 1.  Acute to subacute L2 superior endplate compression fracture. 2. No acute displaced fracture or traumatic listhesis of the cervical and thoracic spine. Chronic stable T11 compression fracture. 3. Diffusely decreased bone density. Electronically Signed   By: Morgane  Margarite M.D.   On: 06/02/2024 23:38   CT T-SPINE NO CHARGE Result Date: 06/02/2024 CLINICAL DATA:  Neck trauma, midline tenderness (Age [age]y) fall on Plavix  from home (lives by self), found on the floor facedown by daughter who had to brwak into her house tonight EXAM: CT CERVICAL, THORACIC, AND LUMBAR SPINE WITHOUT CONTRAST TECHNIQUE: Multidetector CT imaging of the cervical, thoracic and lumbar spine was performed without intravenous contrast. Multiplanar CT image reconstructions were also generated. RADIATION DOSE REDUCTION: This exam was performed according to the departmental dose-optimization program which includes automated exposure control, adjustment of the mA and/or kV according to patient size and/or use of iterative reconstruction technique. COMPARISON:  MRI cervical spine 04/10/2024. CT abdomen pelvis 03/14/2024 FINDINGS: CT CERVICAL SPINE FINDINGS Alignment: Normal. Skull base and vertebrae: Diffusely decreased bone density. Multilevel mild moderate degenerative changes of the spine. No associated severe osseous neural foraminal or central canal stenosis. No acute fracture. No aggressive appearing focal osseous lesion or focal pathologic process. Soft tissues and spinal canal: No prevertebral fluid or swelling. No visible canal hematoma. Upper chest: Biapical pleural/pulmonary scarring. Centrilobular emphysematous changes. Other: None. CT THORACIC SPINE FINDINGS Alignment: Normal. Vertebrae: Diffusely decreased bone density. Chronic stable T11 compression fracture. No acute fracture or focal pathologic process. Paraspinal and other soft tissues: Negative. Disc levels: Maintained. CT LUMBAR SPINE FINDINGS  Segmentation: 5 lumbar type vertebrae. Alignment: Normal. Vertebrae: Diffusely decreased bone density. Multilevel moderate degenerative changes of the spine. Acute to subacute L2 superior endplate compression fracture. Focal pathologic process. Paraspinal and other soft tissues: Negative. Disc levels: Intervertebral disc space narrowing at the L1-L2 level. Intervertebral disc space vacuum phenomenon at the L4-L5 L5-S1 levels. IMPRESSION: 1.  Acute to subacute L2 superior endplate compression fracture. 2. No acute displaced fracture or traumatic listhesis of the cervical and thoracic spine. Chronic stable T11 compression fracture. 3. Diffusely decreased bone density. Electronically Signed   By: Morgane  Naveau M.D.   On: 06/02/2024 23:38   CT Head Wo Contrast Result Date: 06/02/2024 CLINICAL DATA:  Head trauma, moderate-severe EXAM: CT HEAD WITHOUT CONTRAST TECHNIQUE: Contiguous axial images were obtained from the base of the skull through the vertex without intravenous contrast. RADIATION DOSE REDUCTION: This exam was performed according to the departmental dose-optimization program which includes automated exposure control, adjustment of the mA and/or kV according to patient size and/or use of iterative reconstruction technique. COMPARISON:  None Available. FINDINGS: Brain: Patchy and confluent areas of decreased attenuation are noted throughout the deep and periventricular white matter of the cerebral hemispheres bilaterally, compatible with chronic microvascular ischemic disease. No evidence of large-territorial acute infarction. No parenchymal hemorrhage. No mass lesion. No extra-axial collection. No mass effect or midline shift. No hydrocephalus. Basilar cisterns are patent. Vascular: No hyperdense vessel. Atherosclerotic calcifications are present within the cavernous internal carotid arteries. Skull: No acute fracture or focal lesion. Sinuses/Orbits: Paranasal sinuses and mastoid air cells are clear. The  orbits are unremarkable. Other: None. IMPRESSION: No acute intracranial abnormality. Electronically Signed   By: Morgane  Naveau M.D.   On: 06/02/2024 23:30   DG Shoulder Left Result Date: 06/02/2024 CLINICAL DATA:  fall EXAM: LEFT SHOULDER - 2+ VIEW COMPARISON:  None Available. FINDINGS: There is no evidence of fracture or dislocation. There is no evidence of arthropathy or other focal bone abnormality. Soft tissues are unremarkable. IMPRESSION: Negative. Electronically Signed   By: Morgane  Naveau M.D.   On: 06/02/2024 21:58   DG Pelvis 1-2 Views Result Date: 06/02/2024 CLINICAL DATA:  fall EXAM:  PELVIS - 1-2 VIEW COMPARISON:  X-ray pelvis 02/02/2023 FINDINGS: Limited evaluation due to overlapping osseous structures and overlying soft tissues. There is no evidence of pelvic fracture or diastasis. No acute displaced fracture or dislocation of either hips. No pelvic bone lesions are seen. IMPRESSION: Negative for acute traumatic injury. Electronically Signed   By: Morgane  Naveau M.D.   On: 06/02/2024 21:51   DG Knee 1-2 Views Right Result Date: 06/02/2024 CLINICAL DATA:  [id number] Fall 190176 EXAM: RIGHT KNEE - 1-2 VIEW COMPARISON:  None Available. FINDINGS: No evidence of fracture, dislocation, or joint effusion. Mild to moderate medial tibiofemoral joint degenerative changes. Soft tissues are unremarkable. Vascular clips overlie the medial proximal leg. IMPRESSION: No acute displaced fracture or dislocation. Electronically Signed   By: Morgane  Naveau M.D.   On: 06/02/2024 21:50   DG Knee 1-2 Views Left Result Date: 06/02/2024 CLINICAL DATA:  [id number] Fall 190176 EXAM: LEFT KNEE - 1-2 VIEW COMPARISON:  None Available. FINDINGS: No evidence of fracture, dislocation, or joint effusion. No evidence of arthropathy or other focal bone abnormality. Soft tissues are unremarkable. Likely phlebolith posterior to the knee joint. IMPRESSION: No acute displaced fracture or dislocation. Electronically Signed   By:  Morgane  Naveau M.D.   On: 06/02/2024 21:50   DG Chest Portable 1 View Result Date: 06/02/2024 CLINICAL DATA:  fall EXAM: PORTABLE CHEST 1 VIEW COMPARISON:  Chest x-ray 04/06/2024 FINDINGS: The heart and mediastinal contours are unchanged. Atherosclerotic plaque. No focal consolidation. No pulmonary edema. No pleural effusion. No pneumothorax. No acute osseous abnormality.  Sternotomy wires are intact. IMPRESSION: 1. No active disease. 2.  Aortic Atherosclerosis (ICD10-I70.0). Electronically Signed   By: Morgane  Naveau M.D.   On: 06/02/2024 21:49     Signature  -   Lavada Stank M.D on 06/04/2024 at 10:42 AM   -  To page go to [email or web address]

## 2024-06-04 NOTE — Evaluation (Signed)
 Occupational Therapy Evaluation Patient Details Name: NIMCO BIVENS MRN: [id number] DOB: 1946-01-23 Today's Date: 06/04/2024   History of Present Illness   Pt is 78 year old presented to [facility] on  06/02/24 for fall and AMS. Pt found face down at home with unknown time down. Pt found to have acute to subacute L2 endplate compression fx. PMH - CAD s/p CABG 01/27/24, Afib, anxiety, depression, hypothyroidism, HLD.     Clinical Impressions Pt furniture walked or held on to her friend when outside of her home prior to admission. She endorsed many falls. Pt is typically modified independent in self care, sitting to shower. She reports having assist for managing her dogs by a friend. She reports she drives and works at [employer]. Pt presents with generalized weakness, buttocks/hip pain, decreased sitting and standing balance and impaired cognition. She needs up to max assist for bed mobility, mod assist to stand and transfer. She requires set up to total assist for ADLs. Patient will benefit from continued inpatient follow up therapy, <3 hours/day      If plan is discharge home, recommend the following:   Two people to help with walking and/or transfers;A lot of help with bathing/dressing/bathroom;Assistance with cooking/housework;Assistance with feeding;Assist for transportation;Direct supervision/assist for medications management;Direct supervision/assist for financial management;Help with stairs or ramp for entrance     Functional Status Assessment   Patient has had a recent decline in their functional status and demonstrates the ability to make significant improvements in function in a reasonable and predictable amount of time.     Equipment Recommendations   None recommended by OT     Recommendations for Other Services         Precautions/Restrictions   Precautions Precautions: Fall Required Braces or Orthoses: Spinal Brace Spinal Brace: Applied in sitting position (no  specific orders) Restrictions Weight Bearing Restrictions Per Provider Order: No     Mobility Bed Mobility Overal bed mobility: Needs Assistance Bed Mobility: Rolling, Sidelying to Sit, Sit to Sidelying Rolling: Mod assist Sidelying to sit: Max assist     Sit to sidelying: Max assist General bed mobility comments: cues for log roll technique, assist to roll, raise trunk and return to supine    Transfers Overall transfer level: Needs assistance Equipment used: 1 person hand held assist Transfers: Sit to/from Stand, Bed to chair/wheelchair/BSC Sit to Stand: Mod assist     Step pivot transfers: Mod assist     General transfer comment: assist to rise and steady, increased time      Balance Overall balance assessment: Needs assistance Sitting-balance support: Feet supported, Single extremity supported Sitting balance-Leahy Scale: Poor Sitting balance - Comments: needs one hand on bed     Standing balance-Leahy Scale: Poor                             ADL either performed or assessed with clinical judgement   ADL Overall ADL's : Needs assistance/impaired Eating/Feeding: Set up;Sitting;Bed level Eating/Feeding Details (indicate cue type and reason): assist to open packages Grooming: Wash/dry hands;Wash/dry face;Sitting;Supervision/safety   Upper Body Bathing: Minimal assistance;Sitting   Lower Body Bathing: Maximal assistance;Sit to/from stand   Upper Body Dressing : Moderate assistance;Sitting   Lower Body Dressing: Total assistance;Bed level   Toilet Transfer: Moderate assistance;Stand-pivot;BSC/3in1   Toileting- Clothing Manipulation and Hygiene: Set up;Sitting/lateral lean               Vision Baseline Vision/History: 1 Wears glasses Ability  to See in Adequate Light: 1 Impaired Patient Visual Report: No change from baseline Additional Comments: glasses are not at hospital     Perception         Praxis         Pertinent Vitals/Pain  Pain Assessment Pain Assessment: Faces Faces Pain Scale: Hurts even more Pain Location: bil hips/buttocks Pain Descriptors / Indicators: Aching, Sore Pain Intervention(s): Repositioned, Monitored during session     Extremity/Trunk Assessment Upper Extremity Assessment Upper Extremity Assessment: Generalized weakness;Right hand dominant;RUE deficits/detail;LUE deficits/detail RUE Deficits / Details: AROM to 90 degrees FF, when asked if she could raise her arms higher, she said she didn't know RUE Coordination: decreased gross motor LUE Deficits / Details: active shoulder FF to 90 degrees LUE Coordination: decreased gross motor   Lower Extremity Assessment Lower Extremity Assessment: Defer to PT evaluation   Cervical / Trunk Assessment Cervical / Trunk Assessment: Other exceptions (L 2 endplate fx)   Communication Communication Communication: Impaired Factors Affecting Communication: Hearing impaired   Cognition Arousal: Alert Behavior During Therapy: WFL for tasks assessed/performed Cognition: Cognition impaired     Awareness: Intellectual awareness intact, Online awareness impaired Memory impairment (select all impairments): Short-term memory (does not recall events leading up to admission)   Executive functioning impairment (select all impairments): Initiation, Problem solving                   Following commands: Impaired Following commands impaired: Only follows one step commands consistently     Cueing  General Comments   Cueing Techniques: Tactile cues;Verbal cues      Exercises     Shoulder Instructions      Home Living Family/patient expects to be discharged to:: Private residence Living Arrangements: Alone Available Help at Discharge: Family;Friend(s);Available PRN/intermittently Type of Home: Other(Comment) (townhome) Home Access: Level entry     Home Layout: One level     Bathroom Shower/Tub: Producer, television/film/video:  Standard     Home Equipment: Teacher, English as a foreign language (2 wheels);Rollator (4 wheels);Shower seat;Cane - single point          Prior Functioning/Environment Prior Level of Function : Needs assist             Mobility Comments: Furniture walks in home, holds her friend's shoulder when going into a store ADLs Comments: independent in self care, drives, has assist of a friend for caring for her dogs, reports she works at [employer] List: Decreased strength;Decreased activity tolerance;Impaired balance (sitting and/or standing);Decreased cognition;Decreased safety awareness;Decreased knowledge of use of DME or AE;Pain;Impaired UE functional use   OT Treatment/Interventions: Self-care/ADL training;DME and/or AE instruction;Therapeutic activities;Cognitive remediation/compensation;Patient/family education;Balance training      OT Goals(Current goals can be found in the care plan section)   Acute Rehab OT Goals OT Goal Formulation: With patient Time For Goal Achievement: 06/18/24 Potential to Achieve Goals: Good ADL Goals Pt Will Perform Grooming: with contact guard assist;standing Pt Will Perform Upper Body Dressing: sitting;with supervision Pt Will Perform Lower Body Dressing: sit to/from stand;with min assist Pt Will Transfer to Toilet: with contact guard assist;ambulating;bedside commode Pt Will Perform Toileting - Clothing Manipulation and hygiene: with min assist;sit to/from stand Pt/caregiver will Perform Home Exercise Program: Increased strength;Both right and left upper extremity;With written HEP provided;With Supervision Additional ADL Goal #1: Pt will complete bed mobility with min assist in preparation for ADLs using log roll technique.   OT Frequency:  Min 2X/week    Co-evaluation  AM-PAC OT 6 Clicks Daily Activity     Outcome Measure Help from another person eating meals?: A Little Help from another person taking care of  personal grooming?: A Little Help from another person toileting, which includes using toliet, bedpan, or urinal?: A Lot Help from another person bathing (including washing, rinsing, drying)?: A Lot Help from another person to put on and taking off regular upper body clothing?: A Little Help from another person to put on and taking off regular lower body clothing?: Total 6 Click Score: 14   End of Session Equipment Utilized During Treatment: Gait belt  Activity Tolerance: Patient limited by fatigue;Patient limited by pain Patient left: in bed;with call bell/phone within reach;with bed alarm set  OT Visit Diagnosis: Unsteadiness on feet (R26.81);Other abnormalities of gait and mobility (R26.89);Pain;Other symptoms and signs involving cognitive function;Muscle weakness (generalized) (M62.81)                Time: 8980-8958 OT Time Calculation (min): 22 min Charges:  OT General Charges $OT Visit: 1 Visit OT Evaluation $OT Eval Moderate Complexity: 1 Mod  [name] HERO, OTR/L Acute Rehabilitation Services Office: [phone]   Kennth [name] Helling 06/04/2024, 10:55 AM

## 2024-06-04 NOTE — Plan of Care (Signed)
 GI panel positive with enteropathogenic E. coli.  Initiating IV ceftriaxone .

## 2024-06-04 NOTE — NC FL2 (Signed)
 [place]  MEDICAID FL2 LEVEL OF CARE FORM     IDENTIFICATION  Patient Name: Kaitlyn Wright Birthdate: 11-14-1945 Sex: female Admission Date (Current Location): 06/02/2024  [facility] and [place] Number:  Producer, television/film/video and Address:  [facility], [address], [place], [place] [address]      Provider Number: [id number]  Attending Physician Name and Address:  Dennise Lavada POUR, MD  Relative Name and Phone Number:       Current Level of Care: Hospital Recommended Level of Care: Skilled Nursing Facility Prior Approval Number:    Date Approved/Denied:   PASRR Number: [id number]  Discharge Plan: SNF    Current Diagnoses: Patient Active Problem List   Diagnosis Date Noted   Acute metabolic encephalopathy 06/03/2024   Leukocytosis 06/03/2024   Diarrhea 06/03/2024   AMS (altered mental status) 06/03/2024   Acute encephalopathy 04/06/2024   Rhabdomyolysis 04/06/2024   Elevated liver enzymes 04/06/2024   QT prolongation 04/06/2024   CAD (coronary artery disease) 04/06/2024   Hypotension 04/06/2024   Postoperative atrial fibrillation (HCC) 03/19/2024   S/P CABG x 4 01/27/2024   Anxiety and depression 01/25/2024   Hypothyroidism 01/25/2024   NSTEMI (non-ST elevated myocardial infarction) (HCC) 01/23/2024   Elevated troponin 01/23/2024    Orientation RESPIRATION BLADDER Height & Weight     Self, Time, Situation, Place  Normal Incontinent, External catheter Weight: 115 lb 11.9 oz (52.5 kg) Height:  5' 3 (160 cm)  BEHAVIORAL SYMPTOMS/MOOD NEUROLOGICAL BOWEL NUTRITION STATUS      Continent Diet (See dc summary)  AMBULATORY STATUS COMMUNICATION OF NEEDS Skin   Extensive Assist Verbally Other (Comment) (trauma to face and knee)                       Personal Care Assistance Level of Assistance  Bathing, Feeding, Dressing Bathing Assistance: Limited assistance Feeding assistance: Limited assistance Dressing Assistance: Limited  assistance     Functional Limitations Info             SPECIAL CARE FACTORS FREQUENCY  PT (By licensed PT), OT (By licensed OT)     PT Frequency: 5x/week OT Frequency: 5x/week            Contractures Contractures Info: Not present    Additional Factors Info  Code Status, Allergies, Isolation Precautions Code Status Info: Full Allergies Info: Naprosyn  (Naproxen ), Latex     Isolation Precautions Info: E.coli     Current Medications (06/04/2024):  This is the current hospital active medication list Current Facility-Administered Medications  Medication Dose Route Frequency Provider Last Rate Last Admin   0.9 %  sodium chloride  infusion   Intravenous Continuous Singh, Prashant K, MD 50 mL/hr at 06/04/24 0546 New Bag at 06/04/24 0546   cefTRIAXone  (ROCEPHIN ) 2 g in sodium chloride  0.9 % 100 mL IVPB  2 g Intravenous Q24H Sundil, Subrina, MD   Stopped at 06/04/24 0219   clopidogrel  (PLAVIX ) tablet 75 mg  75 mg Oral Daily Rathore, Vasundhra, MD   75 mg at 06/03/24 1003   enoxaparin  (LOVENOX ) injection 40 mg  40 mg Subcutaneous Q24H Hindman, Katherine G, RPH       glucagon  (human recombinant) (GLUCAGEN) injection 1 mg  1 mg Intravenous PRN Amin, Ankit C, MD       hydrALAZINE  (APRESOLINE ) injection 10 mg  10 mg Intravenous Q4H PRN Amin, Ankit C, MD       Influenza vac split trivalent PF (FLUZONE HIGH-DOSE) injection 0.5  mL  0.5 mL Intramuscular Tomorrow-1000 Amin, Ankit C, MD       ipratropium-albuterol  (DUONEB) 0.5-2.5 (3) MG/3ML nebulizer solution 3 mL  3 mL Nebulization Q4H PRN Amin, Ankit C, MD       levothyroxine  (SYNTHROID ) tablet 88 mcg  88 mcg Oral Q0600 Rathore, Vasundhra, MD   88 mcg at 06/04/24 0541   metoprolol  tartrate (LOPRESSOR ) injection 5 mg  5 mg Intravenous Q4H PRN Amin, Ankit C, MD       pantoprazole  (PROTONIX ) EC tablet 40 mg  40 mg Oral Daily Rathore, Vasundhra, MD   40 mg at 06/03/24 1003   pneumococcal 20-valent conjugate vaccine (PREVNAR 20 ) injection 0.5 mL   0.5 mL Intramuscular Tomorrow-1000 Amin, Ankit C, MD       sodium phosphate  30 mmol in sodium chloride  0.9 % 250 mL infusion  30 mmol Intravenous Once Singh, Prashant K, MD 43 mL/hr at 06/04/24 0627 30 mmol at 06/04/24 9372     Discharge Medications: Please see discharge summary for a list of discharge medications.  Relevant Imaging Results:  Relevant Lab Results:   Additional Information SS#; [id number]  Inocente GORMAN Kindle, LCSW

## 2024-06-04 NOTE — Plan of Care (Signed)
  Problem: Health Behavior/Discharge Planning: Goal: Ability to manage health-related needs will improve Outcome: Progressing   Problem: Clinical Measurements: Goal: Will remain free from infection Outcome: Progressing   Problem: Coping: Goal: Level of anxiety will decrease Outcome: Progressing   Problem: Elimination: Goal: Will not experience complications related to bowel motility Outcome: Progressing   Problem: Safety: Goal: Ability to remain free from injury will improve Outcome: Progressing

## 2024-06-05 DIAGNOSIS — G9341 Metabolic encephalopathy: Secondary | ICD-10-CM | POA: Diagnosis not present

## 2024-06-05 LAB — BASIC METABOLIC PANEL WITH GFR
Anion gap: 13 (ref 5–15)
BUN: 11 mg/dL (ref 8–23)
CO2: 25 mmol/L (ref 22–32)
Calcium: 8.1 mg/dL — ABNORMAL LOW (ref 8.9–10.3)
Chloride: 103 mmol/L (ref 98–111)
Creatinine, Ser: 0.71 mg/dL (ref 0.44–1.00)
GFR, Estimated: >60 mL/min (ref >60)
Glucose, Bld: 87 mg/dL (ref 70–99)
Potassium: 4.3 mmol/L (ref 3.5–5.1)
Sodium: 141 mmol/L (ref 135–145)

## 2024-06-05 LAB — CBC
HCT: 36.3 % (ref 36.0–46.0)
Hemoglobin: 12 g/dL (ref 12.0–15.0)
MCH: 31.3 pg (ref 26.0–34.0)
MCHC: 33.1 g/dL (ref 30.0–36.0)
MCV: 94.8 fL (ref 80.0–100.0)
Platelets: 306 K/uL (ref 150–400)
RBC: 3.83 MIL/uL — ABNORMAL LOW (ref 3.87–5.11)
RDW: 18.4 % — ABNORMAL HIGH (ref 11.5–15.5)
WBC: 11.3 K/uL — ABNORMAL HIGH (ref 4.0–10.5)
nRBC: 0 % (ref 0.0–0.2)

## 2024-06-05 LAB — PHOSPHORUS: Phosphorus: 3.7 mg/dL (ref 2.5–4.6)

## 2024-06-05 LAB — GLUCOSE, CAPILLARY
Glucose-Capillary: 114 mg/dL — ABNORMAL HIGH (ref 70–99)
Glucose-Capillary: 66 mg/dL — ABNORMAL LOW (ref 70–99)
Glucose-Capillary: 76 mg/dL (ref 70–99)
Glucose-Capillary: 79 mg/dL (ref 70–99)
Glucose-Capillary: 79 mg/dL (ref 70–99)
Glucose-Capillary: 83 mg/dL (ref 70–99)
Glucose-Capillary: 89 mg/dL (ref 70–99)
Glucose-Capillary: 96 mg/dL (ref 70–99)

## 2024-06-05 LAB — CK: Total CK: 1667 U/L — ABNORMAL HIGH (ref 38–234)

## 2024-06-05 LAB — MAGNESIUM: Magnesium: 1.9 mg/dL (ref 1.7–2.4)

## 2024-06-05 MED ORDER — HALOPERIDOL LACTATE 5 MG/ML IJ SOLN: 5.0000 mg | Freq: Four times a day (QID) | INTRAMUSCULAR | Status: DC | PRN

## 2024-06-05 MED ORDER — CEFAZOLIN SODIUM-DEXTROSE 2-4 GM/100ML-% IV SOLN
2.0000 g | Freq: Three times a day (TID) | INTRAVENOUS | Status: DC
  Administered 2024-06-05 – 2024-06-06 (×6): 2 g via INTRAVENOUS
  Filled 2024-06-05 (×5): qty 100

## 2024-06-05 MED ORDER — DEXTROSE 50 % IV SOLN
12.5000 g | INTRAVENOUS | Status: AC
Start: 2024-06-05 — End: 2024-06-05
  Administered 2024-06-05: 12.5 g via INTRAVENOUS
  Filled 2024-06-05: qty 50

## 2024-06-05 NOTE — Progress Notes (Signed)
 PT Cancellation Note  Patient Details Name: Kaitlyn Wright MRN: [id number] DOB: 09-28-1945   Cancelled Treatment:    Reason Eval/Treat Not Completed: Other (comment) (Per RN, pt was agitated this morning and just given haldol . Will hold therapy for today and follow up tomorrow.)   Olla Delancey 06/05/2024, 10:54 AM
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
## 2024-06-05 NOTE — Plan of Care (Signed)
  Problem: Education: Goal: Knowledge of General Education information will improve Description Including pain rating scale, medication(s)/side effects and non-pharmacologic comfort measures Outcome: Progressing   Problem: Clinical Measurements: Goal: Ability to maintain clinical measurements within normal limits will improve Outcome: Progressing   Problem: Clinical Measurements: Goal: Diagnostic test results will improve Outcome: Progressing   Problem: Activity: Goal: Risk for activity intolerance will decrease Outcome: Progressing   Problem: Coping: Goal: Level of anxiety will decrease Outcome: Progressing

## 2024-06-05 NOTE — TOC Initial Note (Signed)
 Transition of Care [facility]) - Initial/Assessment Note    Patient Details  Name: Kaitlyn Wright MRN: [id number] Date of Birth: 1946-02-12  Transition of Care [facility]) CM/SW Contact:    Inocente GORMAN Kindle, LCSW Phone Number: 06/05/2024, 12:33 PM  Clinical Narrative:                 CSW received consult for possible SNF placement at time of discharge. CSW spoke with patient's daughter due to noted confusion today. Patient's daughter reported that patient lives alone and has a neighbor friend to check on her. Daughter asked CSW why patient could not decide on SNF and CSW explained confusion and how next of kin decision maker works. She requested to speak with the MD as she missed his call earlier. Patient's daughter expressed understanding of PT recommendation and may be agreeable to SNF placement at time of discharge if patient's cognition does not improve. Patient's daughter reports preference for [facility]. CSW alerted her that [name] does not have beds at the moment. She stated patient would not want [facility] and she does not think she liked [facility] prior. CSW discussed insurance authorization process and will provide Medicare SNF ratings list. CSW will send out referrals for review and provide bed offers as available.    Skilled Nursing Rehab Facilities-   [email or web address]   Ratings out of 5 stars (5 the highest)  Name Address  Phone # Quality Care Staffing Health Inspection Overall  [facility] [address] [phone] 2 1 2 1   [facility] [address] [place] [phone] 5 2 4 5   [facility] [address], [place] [phone] 2 1 1 1   [facility] [address] [place] [phone] 4 3 4 4   [facility]  [address], [place] [phone] 4 3 5 5   [facility] [address] [facility] [phone] 5 3 2 3   [facility] [address], [place] [phone] 5 1 2 2   [facility] [address], [place] [phone] 3 4 4 4   [address] ([facility]) [address] [place] [phone] 1 3 3 2   [facility] [address] [place], [place] [phone] 4 2 2 2   [facility] ([place]) [address], [place] [phone] 2 1 1 1   Clotilda Pereyra [address] [place] [phone] 2 4 4 4   [facility] [address], [place] [phone] 3 1 3 2   [facility] ([facility]) [address], [place] [phone] 1 2 4 3           [facility] [address], [place] [phone] 3 1 5 4   [facility] [address] [place] [phone] 4 2 1 1   [facility]  [address], [place] [phone] 2 4 1 1   [facility] [address] [phone] 2 2 5 5   [facility] [address] [place] [address], [place] [phone] 2 2 3 3           [facility] [address] [place] [phone] 2 1 2 1   [facility] (No UHC) [address], [place] [phone] 4 3 4 4   [facility] [address] [facility] [phone] 3  5 5   [facility] [address] [place] [phone] 4 2 1 1   [facility] [address] [place] [phone] 3 1 2 1   [facility] [address], [place] [phone] 1 3 3 2   [facility] [address]  [address] [place] [phone] 2 2 3 3   [facility] [address] [place], [place] [phone] 2 1 4 3   [facility] [address], [facility] [phone] 2 1 1 1   [facility] [address] [place], [place] [phone] 2 1 2 1   [facility] [address] [place] [phone] 3 1 2 1           [facility] [address] [place] [phone] 4 1 3 2   [facility] [address] [place]  [phone] 2 4 4 4   [facility] (No Humana) [address], [place] [phone] 3 2 5 5   [facility]) [address], [place] [phone] 3 2 3 3   [facility] [address] [place] [phone] 5 3 5 5   [facility] [address], [place] [phone] 2 1 1 1   [facility] [address] [place], [place] [phone] 3 5 5 5           [facility] [address] [place], [place] [phone] 5 4 5 5   [facility])  [address] [place] [phone] 1 1 2 1   [facility]) [address], [place] [phone]  2 4 4   [facility] [address] [place] [phone] 3 5 5 5   [address] [place], [place] [phone] 4 2 2 2   [facility] [address] [place] [phone] 1 1 3 1   [facility] [address] [place] [phone] 2 2 2 2      Expected Discharge Plan: Skilled Nursing Facility Barriers to Discharge: Continued Medical Work up, English as a second language teacher, SNF Pending bed offer   Patient Goals and CMS Choice Patient states their goals for this hospitalization and ongoing recovery are:: Rehab CMS Medicare.gov Compare Post Acute Care list provided to:: Patient Represenative (must comment) Choice offered to / list presented to : Adult Children [place] ownership interest in [facility]provided to:: Adult Children    Expected Discharge Plan and Services In-house Referral: Clinical Social Work   Post Acute Care Choice: Skilled Nursing Facility Living arrangements for the past 2 months: Apartment                                      Prior Living Arrangements/Services Living arrangements for the past 2 months: Apartment Lives with:: Self Patient language and need for interpreter reviewed:: Yes Do you feel safe going back to the place where you live?: Yes      Need for Family Participation in Patient Care: Yes (Comment) Care giver support system in place?: Yes (comment)   Criminal Activity/Legal Involvement Pertinent to  Current Situation/Hospitalization: No - Comment as needed  Activities of Daily Living   ADL Screening (condition at time of admission) Independently performs ADLs?: Yes (appropriate for developmental age) Is the patient deaf or have difficulty hearing?: Yes Does the patient have difficulty seeing, even when wearing glasses/contacts?: No Does the patient have difficulty concentrating, remembering, or making decisions?: Yes  Permission Sought/Granted Permission sought to share information with : Facility Medical sales representative, Family Supports Permission granted to share information with : No  Share Information with NAME: ship,julie -Daughter   [phone]  Permission granted to share info w AGENCY: SNFs        Emotional Assessment Appearance:: Appears stated age  Attitude/Demeanor/Rapport: Unable to Assess Affect (typically observed): Unable to Assess Orientation: : Fluctuating Orientation (Suspected and/or reported Sundowners) Alcohol / Substance Use: Not Applicable Psych Involvement: No (comment)  Admission diagnosis:  Hypomagnesemia [E83.42] Elevated CK [R74.8] Closed compression fracture of L2 lumbar vertebra, initial encounter (HCC) [S32.020A] Altered mental status, unspecified altered mental status type [R41.82] AMS (altered mental status) [R41.82] Patient Active Problem List   Diagnosis Date Noted   Acute metabolic encephalopathy 06/03/2024   Leukocytosis 06/03/2024   Diarrhea 06/03/2024   AMS (altered mental status) 06/03/2024   Acute encephalopathy 04/06/2024   Rhabdomyolysis 04/06/2024   Elevated liver enzymes 04/06/2024   QT prolongation 04/06/2024   CAD (coronary artery disease) 04/06/2024   Hypotension 04/06/2024   Postoperative atrial fibrillation (HCC) 03/19/2024   S/P CABG x 4 01/27/2024   Anxiety and depression 01/25/2024   Hypothyroidism 01/25/2024   NSTEMI (non-ST elevated myocardial infarction) (HCC) 01/23/2024   Elevated troponin 01/23/2024   PCP:   Arloa Elsie SAUNDERS, MD Pharmacy:   [id number] [place], [place] - [address] AT [facility] [address] [place] [address] Phone: [phone] Fax: [phone]     Social Drivers of Health (SDOH) Social History: SDOH Screenings   Food Insecurity: No Food Insecurity (06/03/2024)  Housing: Low Risk  (06/03/2024)  Transportation Needs: No Transportation Needs (06/03/2024)  Utilities: Not At Risk (06/03/2024)  Social Connections: Moderately Isolated (06/03/2024)  Tobacco Use: Low Risk  (06/03/2024)   SDOH Interventions:     Readmission Risk Interventions     No data to display

## 2024-06-06 DIAGNOSIS — G9341 Metabolic encephalopathy: Secondary | ICD-10-CM | POA: Diagnosis not present

## 2024-06-06 LAB — GLUCOSE, CAPILLARY
Glucose-Capillary: 139 mg/dL — ABNORMAL HIGH (ref 70–99)
Glucose-Capillary: 160 mg/dL — ABNORMAL HIGH (ref 70–99)
Glucose-Capillary: 55 mg/dL — ABNORMAL LOW (ref 70–99)
Glucose-Capillary: 68 mg/dL — ABNORMAL LOW (ref 70–99)
Glucose-Capillary: 77 mg/dL (ref 70–99)
Glucose-Capillary: 95 mg/dL (ref 70–99)
Glucose-Capillary: 99 mg/dL (ref 70–99)

## 2024-06-06 LAB — CULTURE, BLOOD (ROUTINE X 2)
Special Requests: ADEQUATE
Special Requests: ADEQUATE

## 2024-06-06 MED ORDER — LACTATED RINGERS IV SOLN: INTRAVENOUS | Status: AC

## 2024-06-06 MED ORDER — DEXTROSE 50 % IV SOLN
12.5000 g | INTRAVENOUS | Status: AC
  Administered 2024-06-06: 12.5 g via INTRAVENOUS
  Filled 2024-06-06: qty 50

## 2024-06-06 NOTE — Progress Notes (Signed)
 PROGRESS NOTE                                                                                                                                                                                                             Patient Demographics:    Kaitlyn Wright, is a 78 y.o. female, DOB - 25-Jan-1946, FMW:[id number]  Outpatient Primary MD for the patient is Arloa Elsie SAUNDERS, MD    LOS - 3  Admit date - 06/02/2024    Chief Complaint  Patient presents with   Fall on Plavix    Code Sepsis       Brief Narrative (HPI from H&P)     78 y.o. female with medical history significant of CAD status post CABG 01/27/2024 on aspirin  and Plavix , complicated by postop paroxysmal A-fib not on anticoagulation, elevated LFTs leading to discontinuation of amiodarone  towards the end of June, hypothyroidism, severe mitral regurgitation, cervical spondylosis and multilevel foraminal stenosis, anxiety/depression presenting to the ED via EMS for evaluation of altered mental status.  Patient was found facedown on the ground at home by a family friend.  Reportedly family is estranged and has not talked to the patient for weeks.  Patient last spoke to a friend on Tuesday and nobody had heard from her since.  She was found covered in urine and feces and surrounded by cockroaches/insects per EMS.  Altered with EMS, spontaneously moving her extremities but would not follow commands, got combative and was given Versed .    She did have similar admission in July of this year with unknown cause of encephalopathy, found to be hypotensive requiring vasopressors and subsequently discharged on midodrine .  She was noted to have severe MR, however, declined further cardiac workup.  Skilled nursing facility was recommended but patient had elected to be discharged back home with PT/OT.    Subjective:   In bed, overall confused, cannot provide any reliable history.   Assessment  &  Plan :   Fall, dehydration, rhabdomyolysis, multiple skin lacerations and abrasions.  Acute metabolic encephalopathy  Dehydration likely contributing.  UDS positive for benzodiazepines, was noted to be abusing benzodiazepines in the hospital, hydrate, supportive care, PT OT and monitor.  Follow cultures closely.  UA stable, head CT stable, no headache or focal deficits, no aches or pains.  Continue to monitor with IV fluids.  As needed  Haldol  for agitation.    Leukocytosis and lactic acidosis   No fever, tachycardia, tachypnea, or hypotension.  Mild lactic acidosis could be due to dehydration but given leukocytosis, does not appear toxic, taper down antibiotics and monitor.  Blood culture appears showing Staph epidermidis seem to be more than 1 set, have requested ID to opine, they will address antibiotics for the blood cultures, per ID no need to treat enteropathogenic E. coli for now.   Rhabdomyolysis  Patient had a mechanical fall at home and reportedly on the floor for an extended period of time.  CK 696.  Creatinine stable.  Continue IV fluid hydration and trend CK.   Diarrhea GI pathogen panel positive for enteropathogenic E. coli, being hydrated, discussed with ID no need for treatment unless diarrhea get significantly worse.   Elevated troponin, CAD status post CABG 01/27/2024  Troponin 51> 66 in the setting of fall/rhabdomyolysis.  EKG without STEMI and patient is not endorsing anginal symptoms.  Flat and low troponin in non-ACS pattern.  Remains symptom-free, continue aspirin , Plavix , and metoprolol .  Will resume statin once LFTs have stabilized.   Elevated liver enzymes  In the setting of rhabdomyolysis. AST 86, ALT 75, alk phos 148, T. bili 0.9.  Continue to monitor LFTs.     Hypomagnesemia, hypophosphatemia QT prolongation  Replace magnesium  and phosphorus and monitor labs.  Monitor on telemetry.   Acute to subacute L2 superior endplate compression fracture, chronic T11 fracture.   Secondary to a mechanical fall.  Patient is not endorsing any pain at this time and appears comfortable.  PT/OT eval. TLSO brace.  Discussed with Dr. Joshua neurosurgery who does not think she needs surgical intervention.   Fall  PT/OT eval, fall precautions.  TOC consulted for placement.   Paroxysmal A-fib  Not on anticoagulation.  Continue metoprolol .   Hypothyroidism  Continue Synthroid .   Severe mitral regurgitation  Patient has previously declined further cardiac workup.   GERD  Continue Protonix .   Patient Lines/Drains/Airways Status     Active Line/Drains/Airways     Name Placement date Placement time Site Days   Peripheral IV 06/02/24 20 G Posterior;Right Hand 06/02/24  --  Hand  2   Peripheral IV 06/02/24 22 G 1.75 Anterior;Right Forearm 06/02/24  2152  Forearm  2   External Urinary Catheter 06/03/24  1300  --  1   Wound 04/11/24 1900 Traumatic Back Left;Upper 04/11/24  1900  Back  54   Wound 06/03/24 1051 Traumatic Face Right 06/03/24  1051  Face  1   Wound 06/03/24 1053 Traumatic Face Medial 06/03/24  1053  Face  1   Wound 06/03/24 1054 Traumatic Knee Right 06/03/24  1054  Knee  1   Wound 06/03/24 1102 Knee Left 06/03/24  1102  Knee  1                     Condition -  Guarded  Family Communication  : None at bedside  Code Status :  Full  Consults  : ID Dr. Dennise over the phone  PUD Prophylaxis :     Procedures  :     CT head - non acute  CT chest abdomen pelvis.  Nonacute.  CT T and L-spine.  1.  Acute to subacute L2 superior endplate compression fracture. 2. No acute displaced fracture or traumatic listhesis of the cervical and thoracic spine. Chronic stable T11 compression fracture. 3. Diffusely decreased bone density.      Disposition  Plan  :    Status is: Inpatient   DVT Prophylaxis  :    enoxaparin  (LOVENOX ) injection 40 mg Start: 06/04/24 1400    Lab Results  Component Value Date   PLT 306 06/05/2024    Diet :  Diet Order              DIET SOFT Fluid consistency: Thin  Diet effective now                    Inpatient Medications  Scheduled Meds:  ALPRAZolam   1 mg Oral BID   clopidogrel   75 mg Oral Daily   enoxaparin  (LOVENOX ) injection  40 mg Subcutaneous Q24H   escitalopram   10 mg Oral Daily   hydrOXYzine   10 mg Oral QHS   levothyroxine   88 mcg Oral Q0600   pantoprazole   40 mg Oral Daily   QUEtiapine   50 mg Oral BID   Continuous Infusions:   ceFAZolin  (ANCEF ) IV 2 g (06/06/24 0632)   PRN Meds:.glucagon  (human recombinant), haloperidol  lactate, hydrALAZINE , ipratropium-albuterol , metoprolol  tartrate  Antibiotics  :    Anti-infectives (From admission, onward)    Start     Dose/Rate Route Frequency Ordered Stop   06/05/24 1400  ceFAZolin  (ANCEF ) IVPB 2g/100 mL premix        2 g 200 mL/hr over 30 Minutes Intravenous Every 8 hours 06/05/24 0948     06/04/24 0100  cefTRIAXone  (ROCEPHIN ) 2 g in sodium chloride  0.9 % 100 mL IVPB  Status:  Discontinued        2 g 200 mL/hr over 30 Minutes Intravenous Every 24 hours 06/04/24 0027 06/05/24 0948   06/03/24 0015  ceFEPIme  (MAXIPIME ) 2 g in sodium chloride  0.9 % 100 mL IVPB        2 g 200 mL/hr over 30 Minutes Intravenous  Once 06/03/24 0010 06/03/24 0057   06/03/24 0015  metroNIDAZOLE  (FLAGYL ) IVPB 500 mg        500 mg 100 mL/hr over 60 Minutes Intravenous  Once 06/03/24 0010 06/03/24 0202   06/03/24 0015  vancomycin  (VANCOCIN ) IVPB 1000 mg/200 mL premix        1,000 mg 200 mL/hr over 60 Minutes Intravenous  Once 06/03/24 0010 06/03/24 0228         Objective:   Vitals:   06/06/24 0600 06/06/24 0812 06/06/24 0817 06/06/24 1124  BP: (!) 98/51  123/63 95/60  Pulse: 88     Resp: 16     Temp: 98.5 F (36.9 C)  (!) 97.3 F (36.3 C) 98.5 F (36.9 C)  TempSrc:  Axillary Axillary Oral  SpO2: 99%     Weight:      Height:        Wt Readings from Last 3 Encounters:  06/03/24 52.5 kg  05/11/24 49.9 kg  04/16/24 49.5 kg      Intake/Output Summary (Last 24 hours) at 06/06/2024 1143 Last data filed at 06/06/2024 0600 Gross per 24 hour  Intake 0 ml  Output 900 ml  Net -900 ml     Physical Exam  Somnolent, confused, does not follow any commands or answer any questions, chronic ill-appearing  Good air entry bilaterally  Regular rate and rhythm  Abdomen soft   skin abrasions in her face, chin area, no edema.     Data Review:    Recent Labs  Lab 06/02/24 2144 06/02/24 2341 06/03/24 1213 06/04/24 0451 06/05/24 1235  WBC 7.9 18.5* 18.6* 10.3 11.3*  HGB 5.0*  12.5 11.4* 10.6* 12.0  HCT 16.8* 38.1 35.7* 32.6* 36.3  PLT 148* 334 327 281 306  MCV 104.3* 94.5 96.5 95.0 94.8  MCH 31.1 31.0 30.8 30.9 31.3  MCHC 29.8* 32.8 31.9 32.5 33.1  RDW 18.4* 18.6* 18.5* 18.6* 18.4*  LYMPHSABS 0.4* 1.1  --   --   --   MONOABS 0.3 1.1*  --   --   --   EOSABS 0.0 1.1*  --   --   --   BASOSABS 0.0 0.1  --   --   --     Recent Labs  Lab 06/02/24 2138 06/02/24 2138 06/02/24 2144 06/02/24 2155 06/02/24 2341 06/03/24 0127 06/03/24 0259 06/03/24 0351 06/03/24 1213 06/04/24 0451 06/05/24 1903  NA 144  144  --  143  --   --   --  SPECIMEN CONTAMINATED, UNABLE TO PERFORM TEST(S). 142 135 142 141  K 3.9  3.9  --  3.9  --   --   --  SPECIMEN CONTAMINATED, UNABLE TO PERFORM TEST(S). 2.3* 4.5 4.2 4.3  CL 106  --  103  --   --   --  SPECIMEN CONTAMINATED, UNABLE TO PERFORM TEST(S). 113* 106 109 103  CO2  --    < > 20*  --   --   --  SPECIMEN CONTAMINATED, UNABLE TO PERFORM TEST(S). 20* 23 23 25   ANIONGAP  --    < > 20*  --   --   --  SPECIMEN CONTAMINATED, UNABLE TO PERFORM TEST(S). 9 6 10 13   GLUCOSE 106*  --  99  --   --   --  SPECIMEN CONTAMINATED, UNABLE TO PERFORM TEST(S). 96 91 82 87  BUN 37*  --  26*  --   --   --  SPECIMEN CONTAMINATED, UNABLE TO PERFORM TEST(S). 18 17 19 11   CREATININE 0.60  --  0.83  --   --   --  SPECIMEN CONTAMINATED, UNABLE TO PERFORM TEST(S). 0.60 0.78 1.07* 0.71  AST  --   --   86*  --   --   --  SPECIMEN CONTAMINATED, UNABLE TO PERFORM TEST(S). 191*  --  204*  --   ALT  --   --  75*  --   --   --  SPECIMEN CONTAMINATED, UNABLE TO PERFORM TEST(S). 166*  --  171*  --   ALKPHOS  --   --  148*  --   --   --  SPECIMEN CONTAMINATED, UNABLE TO PERFORM TEST(S). 83  --  101  --   BILITOT  --   --  0.9  --   --   --  SPECIMEN CONTAMINATED, UNABLE TO PERFORM TEST(S). 1.5*  --  0.7  --   ALBUMIN   --   --  3.1*  --   --   --  SPECIMEN CONTAMINATED, UNABLE TO PERFORM TEST(S). 2.0*  --  2.1*  --   PROCALCITON  --   --   --   --   --   --   --  0.11  --   --   --   LATICACIDVEN 2.7*  --   --   --   --  1.0  --   --   --   --   --   TSH  --   --   --  1.320  --   --   --   --   --   --   --  AMMONIA  --   --   --   --  18  --   --   --   --   --   --   MG  --   --  0.6*  --   --   --   --  2.9* 2.8* 2.0 1.9  PHOS  --   --   --   --   --   --   --   --  1.4* 1.6* 3.7  CALCIUM   --    < > 9.1  --   --   --  SPECIMEN CONTAMINATED, UNABLE TO PERFORM TEST(S). 6.7* 8.3* 8.1* 8.1*   < > = values in this interval not displayed.      Recent Labs  Lab 06/02/24 2138 06/02/24 2144 06/02/24 2144 06/02/24 2155 06/02/24 2341 06/03/24 0127 06/03/24 0259 06/03/24 0351 06/03/24 1213 06/04/24 0451 06/05/24 1903  PROCALCITON  --   --   --   --   --   --   --  0.11  --   --   --   LATICACIDVEN 2.7*  --   --   --   --  1.0  --   --   --   --   --   TSH  --   --   --  1.320  --   --   --   --   --   --   --   AMMONIA  --   --   --   --  18  --   --   --   --   --   --   MG  --  0.6*  --   --   --   --   --  2.9* 2.8* 2.0 1.9  CALCIUM   --  9.1   < >  --   --   --  SPECIMEN CONTAMINATED, UNABLE TO PERFORM TEST(S). 6.7* 8.3* 8.1* 8.1*   < > = values in this interval not displayed.    --------------------------------------------------------------------------------------------------------------- Lab Results  Component Value Date   CHOL 237 (H) 01/24/2024   HDL 62 01/24/2024   LDLCALC 157  (H) 01/24/2024   TRIG 92 01/24/2024   CHOLHDL 3.8 01/24/2024    Lab Results  Component Value Date   HGBA1C 5.6 01/24/2024   No results for input(s): TSH, T4TOTAL, FREET4, T3FREE, THYROIDAB in the last 72 hours.  No results for input(s): VITAMINB12, FOLATE, FERRITIN, TIBC, IRON, RETICCTPCT in the last 72 hours. ------------------------------------------------------------------------------------------------------------------ Cardiac Enzymes No results for input(s): CKMB, TROPONINI, MYOGLOBIN in the last 168 hours.  Invalid input(s): CK  Micro Results Recent Results (from the past 240 hours)  Blood culture (routine x 2)     Status: Abnormal   Collection Time: 06/02/24  9:55 PM   Specimen: BLOOD RIGHT FOREARM  Result Value Ref Range Status   Specimen Description BLOOD RIGHT FOREARM  Final   Special Requests   Final    BOTTLES DRAWN AEROBIC AND ANAEROBIC Blood Culture adequate volume   Culture  Setup Time   Final    GRAM POSITIVE COCCI ANAEROBIC BOTTLE ONLY CRITICAL RESULT CALLED TO, READ BACK BY AND VERIFIED WITH: PHARMD J CARNEY 06/03/2024 @ 2136 BY AB Performed at [facility], [address] [place], [place] [address]    Culture STAPHYLOCOCCUS EPIDERMIDIS (A)  Final   Report Status 06/06/2024 FINAL  Final   Organism ID, Bacteria STAPHYLOCOCCUS EPIDERMIDIS  Final  Susceptibility   Staphylococcus epidermidis - MIC*    CIPROFLOXACIN 4 RESISTANT Resistant     ERYTHROMYCIN <=0.25 SENSITIVE Sensitive     GENTAMICIN <=0.5 SENSITIVE Sensitive     OXACILLIN <=0.25 SENSITIVE Sensitive     TETRACYCLINE 2 SENSITIVE Sensitive     VANCOMYCIN  1 SENSITIVE Sensitive     TRIMETH /SULFA  80 RESISTANT Resistant     CLINDAMYCIN <=0.25 SENSITIVE Sensitive     RIFAMPIN <=0.5 SENSITIVE Sensitive     Inducible Clindamycin NEGATIVE Sensitive     * STAPHYLOCOCCUS EPIDERMIDIS  Blood culture (routine x 2)     Status: Abnormal   Collection Time: 06/02/24   9:55 PM   Specimen: BLOOD RIGHT ARM  Result Value Ref Range Status   Specimen Description BLOOD RIGHT ARM  Final   Special Requests   Final    BOTTLES DRAWN AEROBIC ONLY Blood Culture adequate volume   Culture  Setup Time   Final    GRAM POSITIVE COCCI IN CLUSTERS AEROBIC BOTTLE ONLY CRITICAL VALUE NOTED.  VALUE IS CONSISTENT WITH PREVIOUSLY REPORTED AND CALLED VALUE. Performed at [facility], [address] [place], [place] [address]    Culture STAPHYLOCOCCUS EPIDERMIDIS (A)  Final   Report Status 06/06/2024 FINAL  Final   Organism ID, Bacteria STAPHYLOCOCCUS EPIDERMIDIS  Final      Susceptibility   Staphylococcus epidermidis - MIC*    CIPROFLOXACIN >=8 RESISTANT Resistant     ERYTHROMYCIN >=8 RESISTANT Resistant     GENTAMICIN 8 INTERMEDIATE Intermediate     OXACILLIN >=4 RESISTANT Resistant     TETRACYCLINE 2 SENSITIVE Sensitive     VANCOMYCIN  4 SENSITIVE Sensitive     TRIMETH /SULFA  80 RESISTANT Resistant     CLINDAMYCIN >=8 RESISTANT Resistant     RIFAMPIN <=0.5 SENSITIVE Sensitive     Inducible Clindamycin NEGATIVE Sensitive     * STAPHYLOCOCCUS EPIDERMIDIS  Blood Culture ID Panel (Reflexed)     Status: Abnormal   Collection Time: 06/02/24  9:55 PM  Result Value Ref Range Status   Enterococcus faecalis NOT DETECTED NOT DETECTED Final   Enterococcus Faecium NOT DETECTED NOT DETECTED Final   Listeria monocytogenes NOT DETECTED NOT DETECTED Final   Staphylococcus species DETECTED (A) NOT DETECTED Final    Comment: CRITICAL RESULT CALLED TO, READ BACK BY AND VERIFIED WITH: PHARMD J CARNEY 06/03/2024 @ 2136 BY AB    Staphylococcus aureus (BCID) NOT DETECTED NOT DETECTED Final   Staphylococcus epidermidis DETECTED (A) NOT DETECTED Final    Comment: CRITICAL RESULT CALLED TO, READ BACK BY AND VERIFIED WITH: PHARMD J CARNEY 06/03/2024 @ 2136 BY AB    Staphylococcus lugdunensis NOT DETECTED NOT DETECTED Final   Streptococcus species NOT DETECTED NOT DETECTED Final    Streptococcus agalactiae NOT DETECTED NOT DETECTED Final   Streptococcus pneumoniae NOT DETECTED NOT DETECTED Final   Streptococcus pyogenes NOT DETECTED NOT DETECTED Final   A.calcoaceticus-baumannii NOT DETECTED NOT DETECTED Final   Bacteroides fragilis NOT DETECTED NOT DETECTED Final   Enterobacterales NOT DETECTED NOT DETECTED Final   Enterobacter cloacae complex NOT DETECTED NOT DETECTED Final   Escherichia coli NOT DETECTED NOT DETECTED Final   Klebsiella aerogenes NOT DETECTED NOT DETECTED Final   Klebsiella oxytoca NOT DETECTED NOT DETECTED Final   Klebsiella pneumoniae NOT DETECTED NOT DETECTED Final   Proteus species NOT DETECTED NOT DETECTED Final   Salmonella species NOT DETECTED NOT DETECTED Final   Serratia marcescens NOT DETECTED NOT DETECTED Final   Haemophilus influenzae NOT DETECTED  NOT DETECTED Final   Neisseria meningitidis NOT DETECTED NOT DETECTED Final   Pseudomonas aeruginosa NOT DETECTED NOT DETECTED Final   Stenotrophomonas maltophilia NOT DETECTED NOT DETECTED Final   Candida albicans NOT DETECTED NOT DETECTED Final   Candida auris NOT DETECTED NOT DETECTED Final   Candida glabrata NOT DETECTED NOT DETECTED Final   Candida krusei NOT DETECTED NOT DETECTED Final   Candida parapsilosis NOT DETECTED NOT DETECTED Final   Candida tropicalis NOT DETECTED NOT DETECTED Final   Cryptococcus neoformans/gattii NOT DETECTED NOT DETECTED Final   Methicillin resistance mecA/C NOT DETECTED NOT DETECTED Final    Comment: Performed at [facility], [address], [place], [place] [address]  Urine Culture (for pregnant, neutropenic or urologic patients or patients with an indwelling urinary catheter)     Status: None   Collection Time: 06/03/24  1:36 AM   Specimen: Urine, Clean Catch  Result Value Ref Range Status   Specimen Description URINE, CLEAN CATCH  Final   Special Requests NONE  Final   Culture   Final    NO GROWTH Performed at [facility], [address] [place], [place] [address]    Report Status 06/04/2024 FINAL  Final  C Difficile Quick Screen w PCR reflex     Status: None   Collection Time: 06/03/24  1:36 AM   Specimen: STOOL  Result Value Ref Range Status   C Diff antigen NEGATIVE NEGATIVE Final   C Diff toxin NEGATIVE NEGATIVE Final   C Diff interpretation No C. difficile detected.  Final    Comment: Performed at [facility], [address] [place], [place] [address]  Gastrointestinal Panel by PCR , Stool     Status: Abnormal   Collection Time: 06/03/24  1:36 AM   Specimen: Stool  Result Value Ref Range Status   Campylobacter species NOT DETECTED NOT DETECTED Final   Plesimonas shigelloides NOT DETECTED NOT DETECTED Final   Salmonella species NOT DETECTED NOT DETECTED Final   Yersinia enterocolitica NOT DETECTED NOT DETECTED Final   Vibrio species NOT DETECTED NOT DETECTED Final   Vibrio cholerae NOT DETECTED NOT DETECTED Final   Enteroaggregative E coli (EAEC) NOT DETECTED NOT DETECTED Final   Enteropathogenic E coli (EPEC) DETECTED (A) NOT DETECTED Final    Comment: RESULT CALLED TO, READ BACK BY AND VERIFIED WITH: Bentleigh SCHAPYE 06/03/24 2314 KLW    Enterotoxigenic E coli (ETEC) NOT DETECTED NOT DETECTED Final   Shiga like toxin producing E coli (STEC) NOT DETECTED NOT DETECTED Final   Shigella/Enteroinvasive E coli (EIEC) NOT DETECTED NOT DETECTED Final   Cryptosporidium NOT DETECTED NOT DETECTED Final   Cyclospora cayetanensis NOT DETECTED NOT DETECTED Final   Entamoeba histolytica NOT DETECTED NOT DETECTED Final   Giardia lamblia NOT DETECTED NOT DETECTED Final   Adenovirus F40/41 NOT DETECTED NOT DETECTED Final   Astrovirus NOT DETECTED NOT DETECTED Final   Norovirus GI/GII NOT DETECTED NOT DETECTED Final   Rotavirus A NOT DETECTED NOT DETECTED Final   Sapovirus (I, II, IV, and V) NOT DETECTED NOT DETECTED Final    Comment: Performed at [facility], [address] [place], [place] [address]   Culture, blood (Routine X 2) w Reflex to ID Panel     Status: None (Preliminary result)   Collection Time: 06/04/24  2:41 PM   Specimen: BLOOD RIGHT HAND  Result Value Ref Range Status   Specimen Description BLOOD RIGHT HAND  Final  Special Requests   Final    BOTTLES DRAWN AEROBIC ONLY Blood Culture results may not be optimal due to an inadequate volume of blood received in culture bottles   Culture   Final    NO GROWTH 2 DAYS Performed at [facility], [address], [place], [place] [address]    Report Status PENDING  Incomplete    Radiology Report No results found.    Signature  -   Brayton Lye M.D on 06/06/2024 at 11:43 AM   -  To page go to [email or web address]

## 2024-06-06 NOTE — Plan of Care (Signed)
  Problem: Education: Goal: Knowledge of General Education information will improve Description: Including pain rating scale, medication(s)/side effects and non-pharmacologic comfort measures Outcome: Progressing   Problem: Clinical Measurements: Goal: Ability to maintain clinical measurements within normal limits will improve Outcome: Progressing Goal: Cardiovascular complication will be avoided Outcome: Progressing   Problem: Activity: Goal: Risk for activity intolerance will decrease Outcome: Progressing   Problem: Skin Integrity: Goal: Risk for impaired skin integrity will decrease Outcome: Progressing

## 2024-06-06 NOTE — Plan of Care (Signed)

## 2024-06-07 DIAGNOSIS — G9341 Metabolic encephalopathy: Secondary | ICD-10-CM | POA: Diagnosis not present

## 2024-06-07 LAB — COMPREHENSIVE METABOLIC PANEL WITH GFR
ALT: 81 U/L — ABNORMAL HIGH (ref 0–44)
AST: 127 U/L — ABNORMAL HIGH (ref 15–41)
Albumin: 2.1 g/dL — ABNORMAL LOW (ref 3.5–5.0)
Alkaline Phosphatase: 93 U/L (ref 38–126)
Anion gap: 11 (ref 5–15)
BUN: 9 mg/dL (ref 8–23)
CO2: 28 mmol/L (ref 22–32)
Calcium: 8.4 mg/dL — ABNORMAL LOW (ref 8.9–10.3)
Chloride: 99 mmol/L (ref 98–111)
Creatinine, Ser: 1.16 mg/dL — ABNORMAL HIGH (ref 0.44–1.00)
GFR, Estimated: 48 mL/min — ABNORMAL LOW (ref >60)
Glucose, Bld: 93 mg/dL (ref 70–99)
Potassium: 3.8 mmol/L (ref 3.5–5.1)
Sodium: 138 mmol/L (ref 135–145)
Total Bilirubin: 0.5 mg/dL (ref 0.0–1.2)
Total Protein: 4.5 g/dL — ABNORMAL LOW (ref 6.5–8.1)

## 2024-06-07 LAB — PHOSPHORUS: Phosphorus: 3 mg/dL (ref 2.5–4.6)

## 2024-06-07 LAB — CBC
HCT: 32 % — ABNORMAL LOW (ref 36.0–46.0)
Hemoglobin: 10.5 g/dL — ABNORMAL LOW (ref 12.0–15.0)
MCH: 30.9 pg (ref 26.0–34.0)
MCHC: 32.8 g/dL (ref 30.0–36.0)
MCV: 94.1 fL (ref 80.0–100.0)
Platelets: 307 K/uL (ref 150–400)
RBC: 3.4 MIL/uL — ABNORMAL LOW (ref 3.87–5.11)
RDW: 18.3 % — ABNORMAL HIGH (ref 11.5–15.5)
WBC: 8.4 K/uL (ref 4.0–10.5)
nRBC: 0 % (ref 0.0–0.2)

## 2024-06-07 LAB — GLUCOSE, CAPILLARY
Glucose-Capillary: 88 mg/dL (ref 70–99)
Glucose-Capillary: 57 mg/dL — ABNORMAL LOW (ref 70–99)
Glucose-Capillary: 100 mg/dL — ABNORMAL HIGH (ref 70–99)
Glucose-Capillary: 71 mg/dL (ref 70–99)
Glucose-Capillary: 110 mg/dL — ABNORMAL HIGH (ref 70–99)

## 2024-06-07 LAB — MAGNESIUM: Magnesium: 1.7 mg/dL (ref 1.7–2.4)

## 2024-06-07 LAB — C-REACTIVE PROTEIN: CRP: 0.7 mg/dL (ref <1.0)

## 2024-06-07 LAB — CK: Total CK: 1475 U/L — ABNORMAL HIGH (ref 38–234)

## 2024-06-07 LAB — PROCALCITONIN: Procalcitonin: <0.1 ng/mL

## 2024-06-07 MED ORDER — ALPRAZOLAM 0.5 MG PO TABS
0.5000 mg | ORAL_TABLET | Freq: Two times a day (BID) | ORAL | Status: DC
  Administered 2024-06-07 – 2024-06-11 (×9): 0.5 mg via ORAL
  Filled 2024-06-07 (×9): qty 1

## 2024-06-07 MED ORDER — LATANOPROST 0.005 % OP SOLN
1.0000 [drp] | Freq: Every day | OPHTHALMIC | Status: DC
  Administered 2024-06-07 – 2024-06-10 (×4): 1 [drp] via OPHTHALMIC
  Filled 2024-06-07: qty 2.5

## 2024-06-07 MED ORDER — QUETIAPINE FUMARATE 50 MG PO TABS
50.0000 mg | ORAL_TABLET | Freq: Every day | ORAL | Status: DC
  Administered 2024-06-07 – 2024-06-10 (×4): 50 mg via ORAL
  Filled 2024-06-07 (×4): qty 1

## 2024-06-07 MED ORDER — POLYETHYLENE GLYCOL 3350 17 G PO PACK: 17.0000 g | PACK | Freq: Every day | ORAL | Status: DC | PRN
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
## 2024-06-07 NOTE — TOC Progression Note (Signed)
 Transition of Care [facility]) - Progression Note    Patient Details  Name: Kaitlyn Wright MRN: [id number] Date of Birth: 12-Feb-1946  [facility] CM/SW Contact  Inocente GORMAN Kindle, LCSW Phone Number: 06/07/2024, 5:54 PM  Clinical Narrative:    CSW continuing to follow.   Expected Discharge Plan: Skilled Nursing Facility Barriers to Discharge: Continued Medical Work up, English as a second language teacher, SNF Pending bed offer               Expected Discharge Plan and Services In-house Referral: Clinical Social Work   Post Acute Care Choice: Skilled Nursing Facility Living arrangements for the past 2 months: Apartment                                       Social Drivers of Health (SDOH) Interventions SDOH Screenings   Food Insecurity: No Food Insecurity (06/03/2024)  Housing: Low Risk  (06/03/2024)  Transportation Needs: No Transportation Needs (06/03/2024)  Utilities: Not At Risk (06/03/2024)  Social Connections: Moderately Isolated (06/03/2024)  Tobacco Use: Low Risk  (06/03/2024)    Readmission Risk Interventions     No data to display

## 2024-06-07 NOTE — Progress Notes (Signed)
 PROGRESS NOTE                                                                                                                                                                                                             Patient Demographics:    Kaitlyn Wright, is a 78 y.o. female, DOB - 09-Apr-1946, FMW:[id number]  Outpatient Primary MD for the patient is Arloa Elsie SAUNDERS, MD    LOS - 4  Admit date - 06/02/2024    Chief Complaint  Patient presents with   Fall on Plavix    Code Sepsis       Brief Narrative (HPI from H&P)     78 y.o. female with medical history significant of CAD status post CABG 01/27/2024 on aspirin  and Plavix , complicated by postop paroxysmal A-fib not on anticoagulation, elevated LFTs leading to discontinuation of amiodarone  towards the end of June, hypothyroidism, severe mitral regurgitation, cervical spondylosis and multilevel foraminal stenosis, anxiety/depression presenting to the ED via EMS for evaluation of altered mental status.  Patient was found facedown on the ground at home by a family friend.  Reportedly family is estranged and has not talked to the patient for weeks.  Patient last spoke to a friend on Tuesday and nobody had heard from her since.  She was found covered in urine and feces and surrounded by cockroaches/insects per EMS.  Altered with EMS, spontaneously moving her extremities but would not follow commands, got combative and was given Versed .    She did have similar admission in July of this year with unknown cause of encephalopathy, found to be hypotensive requiring vasopressors and subsequently discharged on midodrine .  She was noted to have severe MR, however, declined further cardiac workup.  Skilled nursing facility was recommended but patient had elected to be discharged back home with PT/OT.    Subjective:   She is less confused , more awake, eating 25 to 50% of her meals.   Assessment   & Plan :   Fall, dehydration, rhabdomyolysis, multiple skin lacerations and abrasions.  Acute metabolic encephalopathy  Dehydration likely contributing.  UDS positive for benzodiazepines, was noted to be abusing benzodiazepines in the hospital, hydrate, supportive care, PT OT and monitor.  Follow cultures closely.  UA stable, head CT stable, no headache or focal deficits, no aches or pains.  Continue to monitor with  IV fluids.  As needed Haldol  for agitation.  -Remains altered and confused, has been more somnolent, this has improved after decreasing her Xanax  and Seroquel .   Severe sepsis, present on admission due to enteropathogenic E. coli diarrhea . No fever, tachycardia, tachypnea, or hypotension.  Mild lactic acidosis could be due to dehydration but given leukocytosis, does not appear toxic, taper down antibiotics and monitor.  Blood culture appears showing Staph epidermidis seem to be more than 1 set, have requested ID to opine, they will address antibiotics for the blood cultures, per ID no need to treat enteropathogenic E. coli for now.   Rhabdomyolysis  Patient had a mechanical fall at home and reportedly on the floor for an extended period of time.  CK 696.  Creatinine stable.  Continue IV fluid hydration and trend CK.   Diarrhea GI pathogen panel positive for enteropathogenic E. coli, being hydrated, discussed with ID no need for treatment unless diarrhea get significantly worse.   Elevated troponin, CAD status post CABG 01/27/2024  Troponin 51> 66 in the setting of fall/rhabdomyolysis.  EKG without STEMI and patient is not endorsing anginal symptoms.  Flat and low troponin in non-ACS pattern.  Remains symptom-free, continue aspirin , Plavix , and metoprolol .  Will resume statin once LFTs have stabilized.   Elevated liver enzymes  In the setting of rhabdomyolysis. AST 86, ALT 75, alk phos 148, T. bili 0.9.  Continue to monitor LFTs.     Hypomagnesemia, hypophosphatemia QT prolongation   Replace magnesium  and phosphorus and monitor labs.  Monitor on telemetry.  AKI -creatinine up to 1.16 today, no evidence of retention   Acute to subacute L2 superior endplate compression fracture, chronic T11 fracture.  Secondary to a mechanical fall.  Patient is not endorsing any pain at this time and appears comfortable.  PT/OT eval. TLSO brace.  Discussed with Dr. Joshua neurosurgery who does not think she needs surgical intervention. -Will start calcium  and vitamin D    Fall  PT/OT eval, fall precautions.  TOC consulted for placement.   Paroxysmal A-fib  Not on anticoagulation.  Continue metoprolol .   Hypothyroidism  Continue Synthroid .   Severe mitral regurgitation  Patient has previously declined further cardiac workup.   GERD  Continue Protonix .   Patient Lines/Drains/Airways Status     Active Line/Drains/Airways     Name Placement date Placement time Site Days   Peripheral IV 06/02/24 20 G Posterior;Right Hand 06/02/24  --  Hand  2   Peripheral IV 06/02/24 22 G 1.75 Anterior;Right Forearm 06/02/24  2152  Forearm  2   External Urinary Catheter 06/03/24  1300  --  1   Wound 04/11/24 1900 Traumatic Back Left;Upper 04/11/24  1900  Back  54   Wound 06/03/24 1051 Traumatic Face Right 06/03/24  1051  Face  1   Wound 06/03/24 1053 Traumatic Face Medial 06/03/24  1053  Face  1   Wound 06/03/24 1054 Traumatic Knee Right 06/03/24  1054  Knee  1   Wound 06/03/24 1102 Knee Left 06/03/24  1102  Knee  1                     Condition -  Guarded  Family Communication  : None at bedside  Code Status :  Full  Consults  : ID Dr. Dennise over the phone  PUD Prophylaxis :     Procedures  :     CT head - non acute  CT chest abdomen pelvis.  Nonacute.  CT T and L-spine.  1.  Acute to subacute L2 superior endplate compression fracture. 2. No acute displaced fracture or traumatic listhesis of the cervical and thoracic spine. Chronic stable T11 compression fracture. 3. Diffusely  decreased bone density.      Disposition Plan  :    Status is: Inpatient   DVT Prophylaxis  :    enoxaparin  (LOVENOX ) injection 40 mg Start: 06/04/24 1400    Lab Results  Component Value Date   PLT 307 06/07/2024    Diet :  Diet Order             DIET SOFT Fluid consistency: Thin  Diet effective now                    Inpatient Medications  Scheduled Meds:  ALPRAZolam   0.5 mg Oral BID   clopidogrel   75 mg Oral Daily   enoxaparin  (LOVENOX ) injection  40 mg Subcutaneous Q24H   escitalopram   10 mg Oral Daily   hydrOXYzine   10 mg Oral QHS   levothyroxine   88 mcg Oral Q0600   pantoprazole   40 mg Oral Daily   QUEtiapine   50 mg Oral QHS   Continuous Infusions:   PRN Meds:.glucagon  (human recombinant), haloperidol  lactate, hydrALAZINE , ipratropium-albuterol , metoprolol  tartrate, polyethylene glycol  Antibiotics  :    Anti-infectives (From admission, onward)    Start     Dose/Rate Route Frequency Ordered Stop   06/05/24 1400  ceFAZolin  (ANCEF ) IVPB 2g/100 mL premix  Status:  Discontinued        2 g 200 mL/hr over 30 Minutes Intravenous Every 8 hours 06/05/24 0948 06/07/24 0521   06/04/24 0100  cefTRIAXone  (ROCEPHIN ) 2 g in sodium chloride  0.9 % 100 mL IVPB  Status:  Discontinued        2 g 200 mL/hr over 30 Minutes Intravenous Every 24 hours 06/04/24 0027 06/05/24 0948   06/03/24 0015  ceFEPIme  (MAXIPIME ) 2 g in sodium chloride  0.9 % 100 mL IVPB        2 g 200 mL/hr over 30 Minutes Intravenous  Once 06/03/24 0010 06/03/24 0057   06/03/24 0015  metroNIDAZOLE  (FLAGYL ) IVPB 500 mg        500 mg 100 mL/hr over 60 Minutes Intravenous  Once 06/03/24 0010 06/03/24 0202   06/03/24 0015  vancomycin  (VANCOCIN ) IVPB 1000 mg/200 mL premix        1,000 mg 200 mL/hr over 60 Minutes Intravenous  Once 06/03/24 0010 06/03/24 0228         Objective:   Vitals:   06/07/24 0520 06/07/24 0553 06/07/24 0835 06/07/24 1211  BP: (!) 101/43 91/61 (!) 81/48 (!) 80/42  Pulse:  76 75 78 74  Resp: (!) 3 14 16 15   Temp: 97.6 F (36.4 C)  97.7 F (36.5 C) 98.3 F (36.8 C)  TempSrc: Oral  Oral Oral  SpO2: 96% 93% 96% 96%  Weight:      Height:        Wt Readings from Last 3 Encounters:  06/03/24 52.5 kg  05/11/24 49.9 kg  04/16/24 49.5 kg     Intake/Output Summary (Last 24 hours) at 06/07/2024 1336 Last data filed at 06/07/2024 1115 Gross per 24 hour  Intake 1789.95 ml  Output 1450 ml  Net 339.95 ml     Physical Exam  Awake, confused, frail, chronically ill-appearing Symmetrical Chest wall movement, Good air movement bilaterally, CTAB RRR,No Gallops,Rubs or new Murmurs, No Parasternal Heave +ve B.Sounds, Abd Soft,  No tenderness, No rebound - guarding or rigidity. No Cyanosis, Clubbing or edema         Data Review:    Recent Labs  Lab 06/02/24 2144 06/02/24 2341 06/03/24 1213 06/04/24 0451 06/05/24 1235 06/07/24 0234  WBC 7.9 18.5* 18.6* 10.3 11.3* 8.4  HGB 5.0* 12.5 11.4* 10.6* 12.0 10.5*  HCT 16.8* 38.1 35.7* 32.6* 36.3 32.0*  PLT 148* 334 327 281 306 307  MCV 104.3* 94.5 96.5 95.0 94.8 94.1  MCH 31.1 31.0 30.8 30.9 31.3 30.9  MCHC 29.8* 32.8 31.9 32.5 33.1 32.8  RDW 18.4* 18.6* 18.5* 18.6* 18.4* 18.3*  LYMPHSABS 0.4* 1.1  --   --   --   --   MONOABS 0.3 1.1*  --   --   --   --   EOSABS 0.0 1.1*  --   --   --   --   BASOSABS 0.0 0.1  --   --   --   --     Recent Labs  Lab 06/02/24 2138 06/02/24 2138 06/02/24 2144 06/02/24 2155 06/02/24 2341 06/03/24 0127 06/03/24 0259 06/03/24 0351 06/03/24 1213 06/04/24 0451 06/05/24 1903 06/07/24 0234  NA 144  144  --  143  --   --   --  SPECIMEN CONTAMINATED, UNABLE TO PERFORM TEST(S). 142 135 142 141 138  K 3.9  3.9  --  3.9  --   --   --  SPECIMEN CONTAMINATED, UNABLE TO PERFORM TEST(S). 2.3* 4.5 4.2 4.3 3.8  CL 106  --  103  --   --   --  SPECIMEN CONTAMINATED, UNABLE TO PERFORM TEST(S). 113* 106 109 103 99  CO2  --    < > 20*  --   --   --  SPECIMEN CONTAMINATED, UNABLE  TO PERFORM TEST(S). 20* 23 23 25 28   ANIONGAP  --    < > 20*  --   --   --  SPECIMEN CONTAMINATED, UNABLE TO PERFORM TEST(S). 9 6 10 13 11   GLUCOSE 106*  --  99  --   --   --  SPECIMEN CONTAMINATED, UNABLE TO PERFORM TEST(S). 96 91 82 87 93  BUN 37*  --  26*  --   --   --  SPECIMEN CONTAMINATED, UNABLE TO PERFORM TEST(S). 18 17 19 11 9   CREATININE 0.60  --  0.83  --   --   --  SPECIMEN CONTAMINATED, UNABLE TO PERFORM TEST(S). 0.60 0.78 1.07* 0.71 1.16*  AST  --   --  86*  --   --   --  SPECIMEN CONTAMINATED, UNABLE TO PERFORM TEST(S). 191*  --  204*  --  127*  ALT  --   --  75*  --   --   --  SPECIMEN CONTAMINATED, UNABLE TO PERFORM TEST(S). 166*  --  171*  --  81*  ALKPHOS  --   --  148*  --   --   --  SPECIMEN CONTAMINATED, UNABLE TO PERFORM TEST(S). 83  --  101  --  93  BILITOT  --   --  0.9  --   --   --  SPECIMEN CONTAMINATED, UNABLE TO PERFORM TEST(S). 1.5*  --  0.7  --  0.5  ALBUMIN   --   --  3.1*  --   --   --  SPECIMEN CONTAMINATED, UNABLE TO PERFORM TEST(S). 2.0*  --  2.1*  --  2.1*  CRP  --   --   --   --   --   --   --   --   --   --   --  0.7  PROCALCITON  --   --   --   --   --   --   --  0.11  --   --   --  <0.10  LATICACIDVEN 2.7*  --   --   --   --  1.0  --   --   --   --   --   --   TSH  --   --   --  1.320  --   --   --   --   --   --   --   --   AMMONIA  --   --   --   --  18  --   --   --   --   --   --   --   MG  --    < > 0.6*  --   --   --   --  2.9* 2.8* 2.0 1.9 1.7  PHOS  --   --   --   --   --   --   --   --  1.4* 1.6* 3.7 3.0  CALCIUM   --    < > 9.1  --   --   --  SPECIMEN CONTAMINATED, UNABLE TO PERFORM TEST(S). 6.7* 8.3* 8.1* 8.1* 8.4*   < > = values in this interval not displayed.      Recent Labs  Lab 06/02/24 2138 06/02/24 2144 06/02/24 2155 06/02/24 2341 06/03/24 0127 06/03/24 0259 06/03/24 0351 06/03/24 1213 06/04/24 0451 06/05/24 1903 06/07/24 0234  CRP  --   --   --   --   --   --   --   --   --   --  0.7  PROCALCITON  --   --   --   --   --    --  0.11  --   --   --  <0.10  LATICACIDVEN 2.7*  --   --   --  1.0  --   --   --   --   --   --   TSH  --   --  1.320  --   --   --   --   --   --   --   --   AMMONIA  --   --   --  18  --   --   --   --   --   --   --   MG  --    < >  --   --   --   --  2.9* 2.8* 2.0 1.9 1.7  CALCIUM   --    < >  --   --   --    < > 6.7* 8.3* 8.1* 8.1* 8.4*   < > = values in this interval not displayed.    --------------------------------------------------------------------------------------------------------------- Lab Results  Component Value Date   CHOL 237 (H) 01/24/2024   HDL 62 01/24/2024   LDLCALC 157 (H) 01/24/2024   TRIG 92 01/24/2024   CHOLHDL 3.8 01/24/2024    Lab Results  Component Value Date   HGBA1C 5.6 01/24/2024   No results for input(s): TSH, T4TOTAL, FREET4, T3FREE, THYROIDAB in the last 72 hours.  No results for input(s): VITAMINB12, FOLATE, FERRITIN, TIBC, IRON, RETICCTPCT in the last 72 hours. ------------------------------------------------------------------------------------------------------------------ Cardiac Enzymes No results for input(s): CKMB, TROPONINI, MYOGLOBIN in the last 168 hours.  Invalid input(s): CK  Micro  Results Recent Results (from the past 240 hours)  Blood culture (routine x 2)     Status: Abnormal   Collection Time: 06/02/24  9:55 PM   Specimen: BLOOD RIGHT FOREARM  Result Value Ref Range Status   Specimen Description BLOOD RIGHT FOREARM  Final   Special Requests   Final    BOTTLES DRAWN AEROBIC AND ANAEROBIC Blood Culture adequate volume   Culture  Setup Time   Final    GRAM POSITIVE COCCI ANAEROBIC BOTTLE ONLY CRITICAL RESULT CALLED TO, READ BACK BY AND VERIFIED WITH: PHARMD J CARNEY 06/03/2024 @ 2136 BY AB Performed at [facility], [address], [place], [place] [address]    Culture STAPHYLOCOCCUS EPIDERMIDIS (A)  Final   Report Status 06/06/2024 FINAL  Final   Organism ID, Bacteria STAPHYLOCOCCUS  EPIDERMIDIS  Final      Susceptibility   Staphylococcus epidermidis - MIC*    CIPROFLOXACIN 4 RESISTANT Resistant     ERYTHROMYCIN <=0.25 SENSITIVE Sensitive     GENTAMICIN <=0.5 SENSITIVE Sensitive     OXACILLIN <=0.25 SENSITIVE Sensitive     TETRACYCLINE 2 SENSITIVE Sensitive     VANCOMYCIN  1 SENSITIVE Sensitive     TRIMETH /SULFA  80 RESISTANT Resistant     CLINDAMYCIN <=0.25 SENSITIVE Sensitive     RIFAMPIN <=0.5 SENSITIVE Sensitive     Inducible Clindamycin NEGATIVE Sensitive     * STAPHYLOCOCCUS EPIDERMIDIS  Blood culture (routine x 2)     Status: Abnormal   Collection Time: 06/02/24  9:55 PM   Specimen: BLOOD RIGHT ARM  Result Value Ref Range Status   Specimen Description BLOOD RIGHT ARM  Final   Special Requests   Final    BOTTLES DRAWN AEROBIC ONLY Blood Culture adequate volume   Culture  Setup Time   Final    GRAM POSITIVE COCCI IN CLUSTERS AEROBIC BOTTLE ONLY CRITICAL VALUE NOTED.  VALUE IS CONSISTENT WITH PREVIOUSLY REPORTED AND CALLED VALUE. Performed at [facility], [address], [place], [place] [address]    Culture STAPHYLOCOCCUS EPIDERMIDIS (A)  Final   Report Status 06/06/2024 FINAL  Final   Organism ID, Bacteria STAPHYLOCOCCUS EPIDERMIDIS  Final      Susceptibility   Staphylococcus epidermidis - MIC*    CIPROFLOXACIN >=8 RESISTANT Resistant     ERYTHROMYCIN >=8 RESISTANT Resistant     GENTAMICIN 8 INTERMEDIATE Intermediate     OXACILLIN >=4 RESISTANT Resistant     TETRACYCLINE 2 SENSITIVE Sensitive     VANCOMYCIN  4 SENSITIVE Sensitive     TRIMETH /SULFA  80 RESISTANT Resistant     CLINDAMYCIN >=8 RESISTANT Resistant     RIFAMPIN <=0.5 SENSITIVE Sensitive     Inducible Clindamycin NEGATIVE Sensitive     * STAPHYLOCOCCUS EPIDERMIDIS  Blood Culture ID Panel (Reflexed)     Status: Abnormal   Collection Time: 06/02/24  9:55 PM  Result Value Ref Range Status   Enterococcus faecalis NOT DETECTED NOT DETECTED Final   Enterococcus Faecium NOT DETECTED NOT  DETECTED Final   Listeria monocytogenes NOT DETECTED NOT DETECTED Final   Staphylococcus species DETECTED (A) NOT DETECTED Final    Comment: CRITICAL RESULT CALLED TO, READ BACK BY AND VERIFIED WITH: PHARMD J CARNEY 06/03/2024 @ 2136 BY AB    Staphylococcus aureus (BCID) NOT DETECTED NOT DETECTED Final   Staphylococcus epidermidis DETECTED (A) NOT DETECTED Final    Comment: CRITICAL RESULT CALLED TO, READ BACK BY AND VERIFIED WITH: PHARMD J CARNEY 06/03/2024 @ 2136 BY AB  Staphylococcus lugdunensis NOT DETECTED NOT DETECTED Final   Streptococcus species NOT DETECTED NOT DETECTED Final   Streptococcus agalactiae NOT DETECTED NOT DETECTED Final   Streptococcus pneumoniae NOT DETECTED NOT DETECTED Final   Streptococcus pyogenes NOT DETECTED NOT DETECTED Final   A.calcoaceticus-baumannii NOT DETECTED NOT DETECTED Final   Bacteroides fragilis NOT DETECTED NOT DETECTED Final   Enterobacterales NOT DETECTED NOT DETECTED Final   Enterobacter cloacae complex NOT DETECTED NOT DETECTED Final   Escherichia coli NOT DETECTED NOT DETECTED Final   Klebsiella aerogenes NOT DETECTED NOT DETECTED Final   Klebsiella oxytoca NOT DETECTED NOT DETECTED Final   Klebsiella pneumoniae NOT DETECTED NOT DETECTED Final   Proteus species NOT DETECTED NOT DETECTED Final   Salmonella species NOT DETECTED NOT DETECTED Final   Serratia marcescens NOT DETECTED NOT DETECTED Final   Haemophilus influenzae NOT DETECTED NOT DETECTED Final   Neisseria meningitidis NOT DETECTED NOT DETECTED Final   Pseudomonas aeruginosa NOT DETECTED NOT DETECTED Final   Stenotrophomonas maltophilia NOT DETECTED NOT DETECTED Final   Candida albicans NOT DETECTED NOT DETECTED Final   Candida auris NOT DETECTED NOT DETECTED Final   Candida glabrata NOT DETECTED NOT DETECTED Final   Candida krusei NOT DETECTED NOT DETECTED Final   Candida parapsilosis NOT DETECTED NOT DETECTED Final   Candida tropicalis NOT DETECTED NOT DETECTED Final    Cryptococcus neoformans/gattii NOT DETECTED NOT DETECTED Final   Methicillin resistance mecA/C NOT DETECTED NOT DETECTED Final    Comment: Performed at [facility], [address] [place], [place] [address]  Urine Culture (for pregnant, neutropenic or urologic patients or patients with an indwelling urinary catheter)     Status: None   Collection Time: 06/03/24  1:36 AM   Specimen: Urine, Clean Catch  Result Value Ref Range Status   Specimen Description URINE, CLEAN CATCH  Final   Special Requests NONE  Final   Culture   Final    NO GROWTH Performed at [facility], [address] [place], [place] [address]    Report Status 06/04/2024 FINAL  Final  C Difficile Quick Screen w PCR reflex     Status: None   Collection Time: 06/03/24  1:36 AM   Specimen: STOOL  Result Value Ref Range Status   C Diff antigen NEGATIVE NEGATIVE Final   C Diff toxin NEGATIVE NEGATIVE Final   C Diff interpretation No C. difficile detected.  Final    Comment: Performed at [facility], [address] [place], [place] [address]  Gastrointestinal Panel by PCR , Stool     Status: Abnormal   Collection Time: 06/03/24  1:36 AM   Specimen: Stool  Result Value Ref Range Status   Campylobacter species NOT DETECTED NOT DETECTED Final   Plesimonas shigelloides NOT DETECTED NOT DETECTED Final   Salmonella species NOT DETECTED NOT DETECTED Final   Yersinia enterocolitica NOT DETECTED NOT DETECTED Final   Vibrio species NOT DETECTED NOT DETECTED Final   Vibrio cholerae NOT DETECTED NOT DETECTED Final   Enteroaggregative E coli (EAEC) NOT DETECTED NOT DETECTED Final   Enteropathogenic E coli (EPEC) DETECTED (A) NOT DETECTED Final    Comment: RESULT CALLED TO, READ BACK BY AND VERIFIED WITH: Na SCHAPYE 06/03/24 2314 KLW    Enterotoxigenic E coli (ETEC) NOT DETECTED NOT DETECTED Final   Shiga like toxin producing E coli (STEC) NOT DETECTED NOT DETECTED Final   Shigella/Enteroinvasive E coli  (EIEC) NOT DETECTED NOT DETECTED Final  Cryptosporidium NOT DETECTED NOT DETECTED Final   Cyclospora cayetanensis NOT DETECTED NOT DETECTED Final   Entamoeba histolytica NOT DETECTED NOT DETECTED Final   Giardia lamblia NOT DETECTED NOT DETECTED Final   Adenovirus F40/41 NOT DETECTED NOT DETECTED Final   Astrovirus NOT DETECTED NOT DETECTED Final   Norovirus GI/GII NOT DETECTED NOT DETECTED Final   Rotavirus A NOT DETECTED NOT DETECTED Final   Sapovirus (I, II, IV, and V) NOT DETECTED NOT DETECTED Final    Comment: Performed at [facility], [address] [place], [place] [address]  Culture, blood (Routine X 2) w Reflex to ID Panel     Status: None (Preliminary result)   Collection Time: 06/04/24  2:41 PM   Specimen: BLOOD RIGHT HAND  Result Value Ref Range Status   Specimen Description BLOOD RIGHT HAND  Final   Special Requests   Final    BOTTLES DRAWN AEROBIC ONLY Blood Culture results may not be optimal due to an inadequate volume of blood received in culture bottles   Culture   Final    NO GROWTH 3 DAYS Performed at [facility], [address] [place], [place] [address]    Report Status PENDING  Incomplete    Radiology Report No results found.    Signature  -   Brayton Lye M.D on 06/07/2024 at 1:36 PM   -  To page go to [email or web address]

## 2024-06-07 NOTE — Plan of Care (Signed)

## 2024-06-07 NOTE — Plan of Care (Incomplete)
   Problem: Education: Goal: Knowledge of General Education information will improve Description: Including pain rating scale, medication(s)/side effects and non-pharmacologic comfort measures Outcome: Progressing   Problem: Health Behavior/Discharge Planning: Goal: Ability to manage health-related needs will improve Outcome: Progressing   Problem: Clinical Measurements: Goal: Will remain free from infection Outcome: Progressing

## 2024-06-08 DIAGNOSIS — G9341 Metabolic encephalopathy: Secondary | ICD-10-CM | POA: Diagnosis not present

## 2024-06-08 LAB — CBC
HCT: 30.9 % — ABNORMAL LOW (ref 36.0–46.0)
Hemoglobin: 10.2 g/dL — ABNORMAL LOW (ref 12.0–15.0)
MCH: 31.1 pg (ref 26.0–34.0)
MCHC: 33 g/dL (ref 30.0–36.0)
MCV: 94.2 fL (ref 80.0–100.0)
Platelets: 278 K/uL (ref 150–400)
RBC: 3.28 MIL/uL — ABNORMAL LOW (ref 3.87–5.11)
RDW: 18.3 % — ABNORMAL HIGH (ref 11.5–15.5)
WBC: 9 K/uL (ref 4.0–10.5)
nRBC: 0 % (ref 0.0–0.2)

## 2024-06-08 LAB — COMPREHENSIVE METABOLIC PANEL WITH GFR
ALT: 65 U/L — ABNORMAL HIGH (ref 0–44)
AST: 123 U/L — ABNORMAL HIGH (ref 15–41)
Albumin: 2.2 g/dL — ABNORMAL LOW (ref 3.5–5.0)
Alkaline Phosphatase: 90 U/L (ref 38–126)
Anion gap: 14 (ref 5–15)
BUN: 12 mg/dL (ref 8–23)
CO2: 25 mmol/L (ref 22–32)
Calcium: 8.7 mg/dL — ABNORMAL LOW (ref 8.9–10.3)
Chloride: 99 mmol/L (ref 98–111)
Creatinine, Ser: 0.87 mg/dL (ref 0.44–1.00)
GFR, Estimated: >60 mL/min (ref >60)
Glucose, Bld: 111 mg/dL — ABNORMAL HIGH (ref 70–99)
Potassium: 4.3 mmol/L (ref 3.5–5.1)
Sodium: 138 mmol/L (ref 135–145)
Total Bilirubin: 0.6 mg/dL (ref 0.0–1.2)
Total Protein: 4.7 g/dL — ABNORMAL LOW (ref 6.5–8.1)

## 2024-06-08 LAB — C-REACTIVE PROTEIN: CRP: 0.9 mg/dL (ref <1.0)

## 2024-06-08 LAB — CORTISOL: Cortisol, Plasma: 5.9 ug/dL

## 2024-06-08 LAB — GLUCOSE, CAPILLARY
Glucose-Capillary: 113 mg/dL — ABNORMAL HIGH (ref 70–99)
Glucose-Capillary: 75 mg/dL (ref 70–99)
Glucose-Capillary: 78 mg/dL (ref 70–99)
Glucose-Capillary: 93 mg/dL (ref 70–99)
Glucose-Capillary: 95 mg/dL (ref 70–99)
Glucose-Capillary: 95 mg/dL (ref 70–99)

## 2024-06-08 LAB — CK: Total CK: 1195 U/L — ABNORMAL HIGH (ref 38–234)

## 2024-06-08 LAB — MAGNESIUM: Magnesium: 1.6 mg/dL — ABNORMAL LOW (ref 1.7–2.4)

## 2024-06-08 LAB — PHOSPHORUS: Phosphorus: 3 mg/dL (ref 2.5–4.6)

## 2024-06-08 LAB — PROCALCITONIN: Procalcitonin: <0.1 ng/mL

## 2024-06-08 MED ORDER — ALBUMIN HUMAN 25 % IV SOLN
50.0000 g | Freq: Once | INTRAVENOUS | Status: AC
  Administered 2024-06-08: 50 g via INTRAVENOUS
  Filled 2024-06-08: qty 200

## 2024-06-08 MED ORDER — SODIUM CHLORIDE 0.9 % IV BOLUS
1000.0000 mL | Freq: Once | INTRAVENOUS | Status: AC
  Administered 2024-06-08: 1000 mL via INTRAVENOUS

## 2024-06-08 MED ORDER — MAGNESIUM SULFATE 4 GM/100ML IV SOLN
4.0000 g | Freq: Once | INTRAVENOUS | Status: AC
  Administered 2024-06-08: 4 g via INTRAVENOUS
  Filled 2024-06-08: qty 100

## 2024-06-08 NOTE — TOC Progression Note (Addendum)
 Transition of Care [facility]) - Progression Note    Patient Details  Name: Kaitlyn Wright MRN: [id number] Date of Birth: 1946/06/11  [facility] CM/SW Contact  Inocente GORMAN Kindle, LCSW Phone Number: 06/08/2024, 5:09 PM  Clinical Narrative:    Patient more oriented today per MD. CSW met with patient and discussed SNF. She reported agreement with plan as she does not feel home health services are helpful. She would like to get strong enough to return to work at [employer]. CSW went over SNF bed offers and ratings. She stated she had been to [facility] before and will likely choose that one as her husband has been to [facility] and [facility] and ended up passing there. CSW offered to contact her daughter to assist with decision as daughter wanted her close to her work in [place] but patient stated No and that she did not want to go that far. She requested CSW return tomorrow to confirm plan. CSW assisted patient in preparing for lunch. Patient asked if therapy could come today but she did not remember that she worked with them yesterday.   CSW spoke with [facility] who will have a bed available tomorrow or Sunday. CSW initiated insurance authorization process, Ref# [id number].    Expected Discharge Plan: Skilled Nursing Facility Barriers to Discharge: Continued Medical Work up, English as a second language teacher, SNF Pending bed offer               Expected Discharge Plan and Services In-house Referral: Clinical Social Work   Post Acute Care Choice: Skilled Nursing Facility Living arrangements for the past 2 months: Apartment                                       Social Drivers of Health (SDOH) Interventions SDOH Screenings   Food Insecurity: No Food Insecurity (06/03/2024)  Housing: Low Risk  (06/03/2024)  Transportation Needs: No Transportation Needs (06/03/2024)  Utilities: Not At Risk (06/03/2024)  Social Connections: Moderately Isolated (06/03/2024)  Tobacco Use: Low Risk   (06/03/2024)    Readmission Risk Interventions     No data to display

## 2024-06-08 NOTE — Progress Notes (Signed)
 PROGRESS NOTE                                                                                                                                                                                                             Patient Demographics:    Kaitlyn Wright, is a 78 y.o. female, DOB - 1946/03/10, FMW:[id number]  Outpatient Primary MD for the patient is Arloa Elsie SAUNDERS, MD    LOS - 5  Admit date - 06/02/2024    Chief Complaint  Patient presents with   Fall on Plavix    Code Sepsis       Brief Narrative (HPI from H&P)     78 y.o. female with medical history significant of CAD status post CABG 01/27/2024 on aspirin  and Plavix , complicated by postop paroxysmal A-fib not on anticoagulation, elevated LFTs leading to discontinuation of amiodarone  towards the end of June, hypothyroidism, severe mitral regurgitation, cervical spondylosis and multilevel foraminal stenosis, anxiety/depression presenting to the ED via EMS for evaluation of altered mental status.  Patient was found facedown on the ground at home by a family friend.  Reportedly family is estranged and has not talked to the patient for weeks.  Patient last spoke to a friend on Tuesday and nobody had heard from her since.  She was found covered in urine and feces and surrounded by cockroaches/insects per EMS.  Altered with EMS, spontaneously moving her extremities but would not follow commands, got combative and was given Versed .    She did have similar admission in July of this year with unknown cause of encephalopathy, found to be hypotensive requiring vasopressors and subsequently discharged on midodrine .  She was noted to have severe MR, however, declined further cardiac workup.  Skilled nursing facility was recommended but patient had elected to be discharged back home with PT/OT.    Subjective:   She is more awake and appropriate, denies any complaints today   Assessment  &  Plan :   Fall, dehydration, rhabdomyolysis, multiple skin lacerations and abrasions.  Acute metabolic encephalopathy  Dehydration likely contributing.  UDS positive for benzodiazepines, was noted to be abusing benzodiazepines in the hospital, hydrate, supportive care, PT OT and monitor.  Follow cultures closely.  UA stable, head CT stable, no headache or focal deficits, no aches or pains.  Continue to monitor with IV fluids.  As  needed Haldol  for agitation.  -Remains altered and confused, has been more somnolent, this has improved after decreasing her Xanax  and Seroquel .   Severe sepsis, present on admission due to enteropathogenic E. coli diarrhea . No fever, tachycardia, tachypnea, or hypotension.  Mild lactic acidosis could be due to dehydration but given leukocytosis, does not appear toxic, taper down antibiotics and monitor.  Blood culture appears showing Staph epidermidis seem to be more than 1 set, as appears different organisms as it is with different sensitivities, so IV antibiotics has been discontinued.   Rhabdomyolysis  Patient had a mechanical fall at home and reportedly on the floor for an extended period of time.  EKG peaked at 2721 creatinine stable.  Continue IV fluid hydration and trend CK.   Diarrhea GI pathogen panel positive for enteropathogenic E. coli, being hydrated, discussed with ID no need for treatment unless diarrhea get significantly worse.  Diarrhea has much improved   Elevated troponin, CAD status post CABG 01/27/2024  Troponin 51> 66 in the setting of fall/rhabdomyolysis.  EKG without STEMI and patient is not endorsing anginal symptoms.  Flat and low troponin in non-ACS pattern.  Remains symptom-free, continue aspirin , Plavix , and metoprolol .  Will resume statin once LFTs have stabilized.   Elevated liver enzymes  In the setting of rhabdomyolysis. AST 86, ALT 75, alk phos 148, T. bili 0.9.  Continue to monitor LFTs.     Hypomagnesemia, hypophosphatemia QT prolongation   Replace magnesium  and phosphorus and monitor labs.  Monitor on telemetry.  Hypotension - Blood pressure significantly low today, will give fluid bolus, IV albumin  and TED hose may consider midodrine  if did not respond to these measures  AKI -creatinine up to 1.16 today, no evidence of retention   Acute to subacute L2 superior endplate compression fracture, chronic T11 fracture.  Secondary to a mechanical fall.  Patient is not endorsing any pain at this time and appears comfortable.  PT/OT eval. TLSO brace.  Discussed with Dr. Joshua neurosurgery who does not think she needs surgical intervention. - Started on calcium  and vitamin D    Fall  PT/OT eval, fall precautions.  TOC consulted for placement.   Paroxysmal A-fib  Not on anticoagulation.  Continue metoprolol .  Does appear she had only 1 event after her cardiac surgery with no recurrence   Hypothyroidism  Continue Synthroid .   Severe mitral regurgitation  Patient has previously declined further cardiac workup.   GERD  Continue Protonix .  Hypomagnesemia - Replaced  Patient Lines/Drains/Airways Status     Active Line/Drains/Airways     Name Placement date Placement time Site Days   Peripheral IV 06/02/24 20 G Posterior;Right Hand 06/02/24  --  Hand  2   Peripheral IV 06/02/24 22 G 1.75 Anterior;Right Forearm 06/02/24  2152  Forearm  2   External Urinary Catheter 06/03/24  1300  --  1   Wound 04/11/24 1900 Traumatic Back Left;Upper 04/11/24  1900  Back  54   Wound 06/03/24 1051 Traumatic Face Right 06/03/24  1051  Face  1   Wound 06/03/24 1053 Traumatic Face Medial 06/03/24  1053  Face  1   Wound 06/03/24 1054 Traumatic Knee Right 06/03/24  1054  Knee  1   Wound 06/03/24 1102 Knee Left 06/03/24  1102  Knee  1                     Condition -  Guarded  Family Communication  : None at bedside  Code Status :  Full  Consults  : ID Dr. Dennise over the phone  PUD Prophylaxis :     Procedures  :     CT head - non  acute  CT chest abdomen pelvis.  Nonacute.  CT T and L-spine.  1.  Acute to subacute L2 superior endplate compression fracture. 2. No acute displaced fracture or traumatic listhesis of the cervical and thoracic spine. Chronic stable T11 compression fracture. 3. Diffusely decreased bone density.      Disposition Plan  :    Status is: Inpatient   DVT Prophylaxis  :    enoxaparin  (LOVENOX ) injection 40 mg Start: 06/04/24 1400    Lab Results  Component Value Date   PLT 278 06/08/2024    Diet :  Diet Order             DIET SOFT Fluid consistency: Thin  Diet effective now                    Inpatient Medications  Scheduled Meds:  ALPRAZolam   0.5 mg Oral BID   clopidogrel   75 mg Oral Daily   enoxaparin  (LOVENOX ) injection  40 mg Subcutaneous Q24H   escitalopram   10 mg Oral Daily   hydrOXYzine   10 mg Oral QHS   latanoprost   1 drop Both Eyes QHS   levothyroxine   88 mcg Oral Q0600   pantoprazole   40 mg Oral Daily   QUEtiapine   50 mg Oral QHS   Continuous Infusions:  albumin  human     sodium chloride       PRN Meds:.glucagon  (human recombinant), haloperidol  lactate, hydrALAZINE , ipratropium-albuterol , metoprolol  tartrate, polyethylene glycol  Antibiotics  :    Anti-infectives (From admission, onward)    Start     Dose/Rate Route Frequency Ordered Stop   06/05/24 1400  ceFAZolin  (ANCEF ) IVPB 2g/100 mL premix  Status:  Discontinued        2 g 200 mL/hr over 30 Minutes Intravenous Every 8 hours 06/05/24 0948 06/07/24 0521   06/04/24 0100  cefTRIAXone  (ROCEPHIN ) 2 g in sodium chloride  0.9 % 100 mL IVPB  Status:  Discontinued        2 g 200 mL/hr over 30 Minutes Intravenous Every 24 hours 06/04/24 0027 06/05/24 0948   06/03/24 0015  ceFEPIme  (MAXIPIME ) 2 g in sodium chloride  0.9 % 100 mL IVPB        2 g 200 mL/hr over 30 Minutes Intravenous  Once 06/03/24 0010 06/03/24 0057   06/03/24 0015  metroNIDAZOLE  (FLAGYL ) IVPB 500 mg        500 mg 100 mL/hr over 60  Minutes Intravenous  Once 06/03/24 0010 06/03/24 0202   06/03/24 0015  vancomycin  (VANCOCIN ) IVPB 1000 mg/200 mL premix        1,000 mg 200 mL/hr over 60 Minutes Intravenous  Once 06/03/24 0010 06/03/24 0228         Objective:   Vitals:   06/08/24 0000 06/08/24 0400 06/08/24 0750 06/08/24 1200  BP: (!) 110/59 124/83 (!) 115/53 (!) 71/43  Pulse:  84 88 82  Resp:  (!) 21 15 15   Temp: 98.7 F (37.1 C) 97.8 F (36.6 C) 98.5 F (36.9 C) 98.9 F (37.2 C)  TempSrc: Oral Oral Oral Oral  SpO2: 93% 92%    Weight:      Height:        Wt Readings from Last 3 Encounters:  06/03/24 52.5 kg  05/11/24 49.9 kg  04/16/24 49.5 kg  Intake/Output Summary (Last 24 hours) at 06/08/2024 1339 Last data filed at 06/08/2024 0500 Gross per 24 hour  Intake --  Output 800 ml  Net -800 ml     Physical Exam  Awake Alert, teamly frail, chronically ill-appearing, more awake conversant and appropriate today Symmetrical Chest wall movement, Good air movement bilaterally, CTAB RRR,No Gallops,Rubs or new Murmurs, No Parasternal Heave +ve B.Sounds, Abd Soft, No tenderness, No rebound - guarding or rigidity. No Cyanosis, Clubbing or edema, No new Rash or bruise            Data Review:    Recent Labs  Lab 06/02/24 2144 06/02/24 2341 06/03/24 1213 06/04/24 0451 06/05/24 1235 06/07/24 0234 06/08/24 0321  WBC 7.9 18.5* 18.6* 10.3 11.3* 8.4 9.0  HGB 5.0* 12.5 11.4* 10.6* 12.0 10.5* 10.2*  HCT 16.8* 38.1 35.7* 32.6* 36.3 32.0* 30.9*  PLT 148* 334 327 281 306 307 278  MCV 104.3* 94.5 96.5 95.0 94.8 94.1 94.2  MCH 31.1 31.0 30.8 30.9 31.3 30.9 31.1  MCHC 29.8* 32.8 31.9 32.5 33.1 32.8 33.0  RDW 18.4* 18.6* 18.5* 18.6* 18.4* 18.3* 18.3*  LYMPHSABS 0.4* 1.1  --   --   --   --   --   MONOABS 0.3 1.1*  --   --   --   --   --   EOSABS 0.0 1.1*  --   --   --   --   --   BASOSABS 0.0 0.1  --   --   --   --   --     Recent Labs  Lab 06/02/24 2138 06/02/24 2144 06/02/24 2155  06/02/24 2341 06/03/24 0127 06/03/24 0259 06/03/24 0351 06/03/24 1213 06/04/24 0451 06/05/24 1903 06/07/24 0234 06/08/24 0321  NA 144  144   < >  --   --   --  SPECIMEN CONTAMINATED, UNABLE TO PERFORM TEST(S). 142 135 142 141 138 138  K 3.9  3.9   < >  --   --   --  SPECIMEN CONTAMINATED, UNABLE TO PERFORM TEST(S). 2.3* 4.5 4.2 4.3 3.8 4.3  CL 106   < >  --   --   --  SPECIMEN CONTAMINATED, UNABLE TO PERFORM TEST(S). 113* 106 109 103 99 99  CO2  --    < >  --   --   --  SPECIMEN CONTAMINATED, UNABLE TO PERFORM TEST(S). 20* 23 23 25 28 25   ANIONGAP  --    < >  --   --   --  SPECIMEN CONTAMINATED, UNABLE TO PERFORM TEST(S). 9 6 10 13 11 14   GLUCOSE 106*   < >  --   --   --  SPECIMEN CONTAMINATED, UNABLE TO PERFORM TEST(S). 96 91 82 87 93 111*  BUN 37*   < >  --   --   --  SPECIMEN CONTAMINATED, UNABLE TO PERFORM TEST(S). 18 17 19 11 9 12   CREATININE 0.60   < >  --   --   --  SPECIMEN CONTAMINATED, UNABLE TO PERFORM TEST(S). 0.60 0.78 1.07* 0.71 1.16* 0.87  AST  --    < >  --   --   --  SPECIMEN CONTAMINATED, UNABLE TO PERFORM TEST(S). 191*  --  204*  --  127* 123*  ALT  --    < >  --   --   --  SPECIMEN CONTAMINATED, UNABLE TO PERFORM TEST(S). 166*  --  171*  --  81*  65*  ALKPHOS  --    < >  --   --   --  SPECIMEN CONTAMINATED, UNABLE TO PERFORM TEST(S). 83  --  101  --  93 90  BILITOT  --    < >  --   --   --  SPECIMEN CONTAMINATED, UNABLE TO PERFORM TEST(S). 1.5*  --  0.7  --  0.5 0.6  ALBUMIN   --    < >  --   --   --  SPECIMEN CONTAMINATED, UNABLE TO PERFORM TEST(S). 2.0*  --  2.1*  --  2.1* 2.2*  CRP  --   --   --   --   --   --   --   --   --   --  0.7 0.9  PROCALCITON  --   --   --   --   --   --  0.11  --   --   --  <0.10 <0.10  LATICACIDVEN 2.7*  --   --   --  1.0  --   --   --   --   --   --   --   TSH  --   --  1.320  --   --   --   --   --   --   --   --   --   AMMONIA  --   --   --  18  --   --   --   --   --   --   --   --   MG  --    < >  --   --   --   --  2.9* 2.8* 2.0 1.9  1.7 1.6*  PHOS  --   --   --   --   --   --   --  1.4* 1.6* 3.7 3.0 3.0  CALCIUM   --    < >  --   --   --  SPECIMEN CONTAMINATED, UNABLE TO PERFORM TEST(S). 6.7* 8.3* 8.1* 8.1* 8.4* 8.7*   < > = values in this interval not displayed.      Recent Labs  Lab 06/02/24 2138 06/02/24 2144 06/02/24 2155 06/02/24 2341 06/03/24 0127 06/03/24 0259 06/03/24 0351 06/03/24 1213 06/04/24 0451 06/05/24 1903 06/07/24 0234 06/08/24 0321  CRP  --   --   --   --   --   --   --   --   --   --  0.7 0.9  PROCALCITON  --   --   --   --   --   --  0.11  --   --   --  <0.10 <0.10  LATICACIDVEN 2.7*  --   --   --  1.0  --   --   --   --   --   --   --   TSH  --   --  1.320  --   --   --   --   --   --   --   --   --   AMMONIA  --   --   --  18  --   --   --   --   --   --   --   --   MG  --    < >  --   --   --   --  2.9* 2.8*  2.0 1.9 1.7 1.6*  CALCIUM   --    < >  --   --   --    < > 6.7* 8.3* 8.1* 8.1* 8.4* 8.7*   < > = values in this interval not displayed.    --------------------------------------------------------------------------------------------------------------- Lab Results  Component Value Date   CHOL 237 (H) 01/24/2024   HDL 62 01/24/2024   LDLCALC 157 (H) 01/24/2024   TRIG 92 01/24/2024   CHOLHDL 3.8 01/24/2024    Lab Results  Component Value Date   HGBA1C 5.6 01/24/2024   No results for input(s): TSH, T4TOTAL, FREET4, T3FREE, THYROIDAB in the last 72 hours.  No results for input(s): VITAMINB12, FOLATE, FERRITIN, TIBC, IRON, RETICCTPCT in the last 72 hours. ------------------------------------------------------------------------------------------------------------------ Cardiac Enzymes No results for input(s): CKMB, TROPONINI, MYOGLOBIN in the last 168 hours.  Invalid input(s): CK  Micro Results Recent Results (from the past 240 hours)  Blood culture (routine x 2)     Status: Abnormal   Collection Time: 06/02/24  9:55 PM   Specimen: BLOOD  RIGHT FOREARM  Result Value Ref Range Status   Specimen Description BLOOD RIGHT FOREARM  Final   Special Requests   Final    BOTTLES DRAWN AEROBIC AND ANAEROBIC Blood Culture adequate volume   Culture  Setup Time   Final    GRAM POSITIVE COCCI ANAEROBIC BOTTLE ONLY CRITICAL RESULT CALLED TO, READ BACK BY AND VERIFIED WITH: PHARMD J CARNEY 06/03/2024 @ 2136 BY AB Performed at [facility], [address] [place], [place] [address]    Culture STAPHYLOCOCCUS EPIDERMIDIS (A)  Final   Report Status 06/06/2024 FINAL  Final   Organism ID, Bacteria STAPHYLOCOCCUS EPIDERMIDIS  Final      Susceptibility   Staphylococcus epidermidis - MIC*    CIPROFLOXACIN 4 RESISTANT Resistant     ERYTHROMYCIN <=0.25 SENSITIVE Sensitive     GENTAMICIN <=0.5 SENSITIVE Sensitive     OXACILLIN <=0.25 SENSITIVE Sensitive     TETRACYCLINE 2 SENSITIVE Sensitive     VANCOMYCIN  1 SENSITIVE Sensitive     TRIMETH /SULFA  80 RESISTANT Resistant     CLINDAMYCIN <=0.25 SENSITIVE Sensitive     RIFAMPIN <=0.5 SENSITIVE Sensitive     Inducible Clindamycin NEGATIVE Sensitive     * STAPHYLOCOCCUS EPIDERMIDIS  Blood culture (routine x 2)     Status: Abnormal   Collection Time: 06/02/24  9:55 PM   Specimen: BLOOD RIGHT ARM  Result Value Ref Range Status   Specimen Description BLOOD RIGHT ARM  Final   Special Requests   Final    BOTTLES DRAWN AEROBIC ONLY Blood Culture adequate volume   Culture  Setup Time   Final    GRAM POSITIVE COCCI IN CLUSTERS AEROBIC BOTTLE ONLY CRITICAL VALUE NOTED.  VALUE IS CONSISTENT WITH PREVIOUSLY REPORTED AND CALLED VALUE. Performed at [facility], [address] [place], [place] [address]    Culture STAPHYLOCOCCUS EPIDERMIDIS (A)  Final   Report Status 06/06/2024 FINAL  Final   Organism ID, Bacteria STAPHYLOCOCCUS EPIDERMIDIS  Final      Susceptibility   Staphylococcus epidermidis - MIC*    CIPROFLOXACIN >=8 RESISTANT Resistant     ERYTHROMYCIN >=8 RESISTANT Resistant      GENTAMICIN 8 INTERMEDIATE Intermediate     OXACILLIN >=4 RESISTANT Resistant     TETRACYCLINE 2 SENSITIVE Sensitive     VANCOMYCIN  4 SENSITIVE Sensitive     TRIMETH /SULFA  80 RESISTANT Resistant     CLINDAMYCIN >=8 RESISTANT Resistant  RIFAMPIN <=0.5 SENSITIVE Sensitive     Inducible Clindamycin NEGATIVE Sensitive     * STAPHYLOCOCCUS EPIDERMIDIS  Blood Culture ID Panel (Reflexed)     Status: Abnormal   Collection Time: 06/02/24  9:55 PM  Result Value Ref Range Status   Enterococcus faecalis NOT DETECTED NOT DETECTED Final   Enterococcus Faecium NOT DETECTED NOT DETECTED Final   Listeria monocytogenes NOT DETECTED NOT DETECTED Final   Staphylococcus species DETECTED (A) NOT DETECTED Final    Comment: CRITICAL RESULT CALLED TO, READ BACK BY AND VERIFIED WITH: PHARMD J CARNEY 06/03/2024 @ 2136 BY AB    Staphylococcus aureus (BCID) NOT DETECTED NOT DETECTED Final   Staphylococcus epidermidis DETECTED (A) NOT DETECTED Final    Comment: CRITICAL RESULT CALLED TO, READ BACK BY AND VERIFIED WITH: PHARMD J CARNEY 06/03/2024 @ 2136 BY AB    Staphylococcus lugdunensis NOT DETECTED NOT DETECTED Final   Streptococcus species NOT DETECTED NOT DETECTED Final   Streptococcus agalactiae NOT DETECTED NOT DETECTED Final   Streptococcus pneumoniae NOT DETECTED NOT DETECTED Final   Streptococcus pyogenes NOT DETECTED NOT DETECTED Final   A.calcoaceticus-baumannii NOT DETECTED NOT DETECTED Final   Bacteroides fragilis NOT DETECTED NOT DETECTED Final   Enterobacterales NOT DETECTED NOT DETECTED Final   Enterobacter cloacae complex NOT DETECTED NOT DETECTED Final   Escherichia coli NOT DETECTED NOT DETECTED Final   Klebsiella aerogenes NOT DETECTED NOT DETECTED Final   Klebsiella oxytoca NOT DETECTED NOT DETECTED Final   Klebsiella pneumoniae NOT DETECTED NOT DETECTED Final   Proteus species NOT DETECTED NOT DETECTED Final   Salmonella species NOT DETECTED NOT DETECTED Final   Serratia marcescens NOT  DETECTED NOT DETECTED Final   Haemophilus influenzae NOT DETECTED NOT DETECTED Final   Neisseria meningitidis NOT DETECTED NOT DETECTED Final   Pseudomonas aeruginosa NOT DETECTED NOT DETECTED Final   Stenotrophomonas maltophilia NOT DETECTED NOT DETECTED Final   Candida albicans NOT DETECTED NOT DETECTED Final   Candida auris NOT DETECTED NOT DETECTED Final   Candida glabrata NOT DETECTED NOT DETECTED Final   Candida krusei NOT DETECTED NOT DETECTED Final   Candida parapsilosis NOT DETECTED NOT DETECTED Final   Candida tropicalis NOT DETECTED NOT DETECTED Final   Cryptococcus neoformans/gattii NOT DETECTED NOT DETECTED Final   Methicillin resistance mecA/C NOT DETECTED NOT DETECTED Final    Comment: Performed at [facility], [address] [place], [place] [address]  Urine Culture (for pregnant, neutropenic or urologic patients or patients with an indwelling urinary catheter)     Status: None   Collection Time: 06/03/24  1:36 AM   Specimen: Urine, Clean Catch  Result Value Ref Range Status   Specimen Description URINE, CLEAN CATCH  Final   Special Requests NONE  Final   Culture   Final    NO GROWTH Performed at [facility], [address] [place], [place] [address]    Report Status 06/04/2024 FINAL  Final  C Difficile Quick Screen w PCR reflex     Status: None   Collection Time: 06/03/24  1:36 AM   Specimen: STOOL  Result Value Ref Range Status   C Diff antigen NEGATIVE NEGATIVE Final   C Diff toxin NEGATIVE NEGATIVE Final   C Diff interpretation No C. difficile detected.  Final    Comment: Performed at [facility], [address], [place], [place] [address]  Gastrointestinal Panel by PCR , Stool     Status: Abnormal   Collection Time:  06/03/24  1:36 AM   Specimen: Stool  Result Value Ref Range Status   Campylobacter species NOT DETECTED NOT DETECTED Final   Plesimonas shigelloides NOT DETECTED NOT DETECTED Final   Salmonella species NOT DETECTED NOT  DETECTED Final   Yersinia enterocolitica NOT DETECTED NOT DETECTED Final   Vibrio species NOT DETECTED NOT DETECTED Final   Vibrio cholerae NOT DETECTED NOT DETECTED Final   Enteroaggregative E coli (EAEC) NOT DETECTED NOT DETECTED Final   Enteropathogenic E coli (EPEC) DETECTED (A) NOT DETECTED Final    Comment: RESULT CALLED TO, READ BACK BY AND VERIFIED WITH: Willistine SCHAPYE 06/03/24 2314 KLW    Enterotoxigenic E coli (ETEC) NOT DETECTED NOT DETECTED Final   Shiga like toxin producing E coli (STEC) NOT DETECTED NOT DETECTED Final   Shigella/Enteroinvasive E coli (EIEC) NOT DETECTED NOT DETECTED Final   Cryptosporidium NOT DETECTED NOT DETECTED Final   Cyclospora cayetanensis NOT DETECTED NOT DETECTED Final   Entamoeba histolytica NOT DETECTED NOT DETECTED Final   Giardia lamblia NOT DETECTED NOT DETECTED Final   Adenovirus F40/41 NOT DETECTED NOT DETECTED Final   Astrovirus NOT DETECTED NOT DETECTED Final   Norovirus GI/GII NOT DETECTED NOT DETECTED Final   Rotavirus A NOT DETECTED NOT DETECTED Final   Sapovirus (I, II, IV, and V) NOT DETECTED NOT DETECTED Final    Comment: Performed at [facility], [address] [place], [place] [address]  Culture, blood (Routine X 2) w Reflex to ID Panel     Status: None (Preliminary result)   Collection Time: 06/04/24  2:41 PM   Specimen: BLOOD RIGHT HAND  Result Value Ref Range Status   Specimen Description BLOOD RIGHT HAND  Final   Special Requests   Final    BOTTLES DRAWN AEROBIC ONLY Blood Culture results may not be optimal due to an inadequate volume of blood received in culture bottles   Culture   Final    NO GROWTH 4 DAYS Performed at [facility], [address], [place], [place] [address]    Report Status PENDING  Incomplete    Radiology Report No results found.    Signature  -   Brayton Lye M.D on 06/08/2024 at 1:39 PM   -  To page go to [email or web address]

## 2024-06-08 NOTE — Care Management Important Message (Signed)
 Important Message  Patient Details  Name: Kaitlyn Wright MRN: [id number] Date of Birth: 06-18-1946   Important Message Given:  Yes - Medicare IM     Claretta Deed 06/08/2024, 4:09 PM

## 2024-06-08 NOTE — Plan of Care (Signed)

## 2024-06-09 DIAGNOSIS — G9341 Metabolic encephalopathy: Secondary | ICD-10-CM | POA: Diagnosis not present

## 2024-06-09 LAB — GLUCOSE, CAPILLARY
Glucose-Capillary: 107 mg/dL — ABNORMAL HIGH (ref 70–99)
Glucose-Capillary: 107 mg/dL — ABNORMAL HIGH (ref 70–99)
Glucose-Capillary: 117 mg/dL — ABNORMAL HIGH (ref 70–99)
Glucose-Capillary: 130 mg/dL — ABNORMAL HIGH (ref 70–99)
Glucose-Capillary: 131 mg/dL — ABNORMAL HIGH (ref 70–99)
Glucose-Capillary: 81 mg/dL (ref 70–99)
Glucose-Capillary: 93 mg/dL (ref 70–99)

## 2024-06-09 LAB — BASIC METABOLIC PANEL WITH GFR
Anion gap: 9 (ref 5–15)
BUN: 11 mg/dL (ref 8–23)
CO2: 25 mmol/L (ref 22–32)
Calcium: 8.6 mg/dL — ABNORMAL LOW (ref 8.9–10.3)
Chloride: 103 mmol/L (ref 98–111)
Creatinine, Ser: 0.8 mg/dL (ref 0.44–1.00)
GFR, Estimated: >60 mL/min (ref >60)
Glucose, Bld: 79 mg/dL (ref 70–99)
Potassium: 4.1 mmol/L (ref 3.5–5.1)
Sodium: 137 mmol/L (ref 135–145)

## 2024-06-09 LAB — CULTURE, BLOOD (ROUTINE X 2): Culture: NO GROWTH

## 2024-06-09 LAB — CBC
HCT: 29.9 % — ABNORMAL LOW (ref 36.0–46.0)
Hemoglobin: 10 g/dL — ABNORMAL LOW (ref 12.0–15.0)
MCH: 31.7 pg (ref 26.0–34.0)
MCHC: 33.4 g/dL (ref 30.0–36.0)
MCV: 94.9 fL (ref 80.0–100.0)
Platelets: 275 K/uL (ref 150–400)
RBC: 3.15 MIL/uL — ABNORMAL LOW (ref 3.87–5.11)
RDW: 18.6 % — ABNORMAL HIGH (ref 11.5–15.5)
WBC: 7.3 K/uL (ref 4.0–10.5)
nRBC: 0 % (ref 0.0–0.2)

## 2024-06-09 LAB — PROCALCITONIN: Procalcitonin: <0.1 ng/mL

## 2024-06-09 LAB — MAGNESIUM: Magnesium: 2.3 mg/dL (ref 1.7–2.4)

## 2024-06-09 MED ORDER — BACITRACIN ZINC 500 UNIT/GM EX OINT
TOPICAL_OINTMENT | Freq: Two times a day (BID) | CUTANEOUS | Status: DC
  Administered 2024-06-11: 31.5 via TOPICAL
  Filled 2024-06-09: qty 28.4

## 2024-06-09 MED ORDER — MIDODRINE HCL 5 MG PO TABS
5.0000 mg | ORAL_TABLET | Freq: Three times a day (TID) | ORAL | Status: DC
  Administered 2024-06-09 – 2024-06-10 (×4): 5 mg via ORAL
  Filled 2024-06-09 (×4): qty 1

## 2024-06-09 MED ORDER — SODIUM CHLORIDE 0.9 % IV SOLN: INTRAVENOUS | Status: DC

## 2024-06-09 NOTE — Plan of Care (Signed)
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
## 2024-06-09 NOTE — TOC Progression Note (Addendum)
 Transition of Care [facility]) - Progression Note    Patient Details  Name: ANOUSHKA DIVITO MRN: [id number] Date of Birth: 06/16/1946  [facility] CM/SW Contact  Isaiah Public, LCSWA Phone Number: 06/09/2024, 2:01 PM  Clinical Narrative:     Patients insurance authorization has been approved for SNF. Plan Auth ID# [id number]. Auth ID# [id number]. Patients insurance authorization has been approved from 9/20-9/23.Tanya with Facility informed CSW they can accept patient when medically ready. CSW informed MD. MD informed CSW patient not medically stable today. Tanya with facility informed CSW if patient medically ready tomorrow, facility can accept. CSW informed MD.CSW will continue to follow.  Expected Discharge Plan: Skilled Nursing Facility Barriers to Discharge: Continued Medical Work up, English as a second language teacher               Expected Discharge Plan and Services In-house Referral: Clinical Social Work   Post Acute Care Choice: Skilled Nursing Facility Living arrangements for the past 2 months: Apartment                                       Social Drivers of Health (SDOH) Interventions SDOH Screenings   Food Insecurity: No Food Insecurity (06/03/2024)  Housing: Low Risk  (06/03/2024)  Transportation Needs: No Transportation Needs (06/03/2024)  Utilities: Not At Risk (06/03/2024)  Social Connections: Moderately Isolated (06/03/2024)  Tobacco Use: Low Risk  (06/03/2024)    Readmission Risk Interventions     No data to display

## 2024-06-09 NOTE — Progress Notes (Signed)
 PROGRESS NOTE                                                                                                                                                                                                             Patient Demographics:    Kaitlyn Wright, is a 78 y.o. female, DOB - 04-06-1946, FMW:[id number]  Outpatient Primary MD for the patient is Arloa Elsie SAUNDERS, MD    LOS - 6  Admit date - 06/02/2024    Chief Complaint  Patient presents with   Fall on Plavix    Code Sepsis       Brief Narrative (HPI from H&P)     78 y.o. female with medical history significant of CAD status post CABG 01/27/2024 on aspirin  and Plavix , complicated by postop paroxysmal A-fib not on anticoagulation, elevated LFTs leading to discontinuation of amiodarone  towards the end of June, hypothyroidism, severe mitral regurgitation, cervical spondylosis and multilevel foraminal stenosis, anxiety/depression presenting to the ED via EMS for evaluation of altered mental status.  Patient was found facedown on the ground at home by a family friend.  Reportedly family is estranged and has not talked to the patient for weeks.  Patient last spoke to a friend on Tuesday and nobody had heard from her since.  She was found covered in urine and feces and surrounded by cockroaches/insects per EMS.  Altered with EMS, spontaneously moving her extremities but would not follow commands, got combative and was given Versed .    She did have similar admission in July of this year with unknown cause of encephalopathy, found to be hypotensive requiring vasopressors and subsequently discharged on midodrine .  She was noted to have severe MR, however, declined further cardiac workup.  Skilled nursing facility was recommended but patient had elected to be discharged back home with PT/OT.    Subjective:   Reports good appetite, no nausea, no vomiting, no diarrhea   Assessment  &  Plan :   Fall, dehydration, rhabdomyolysis, multiple skin lacerations and abrasions.  Acute metabolic encephalopathy  Dehydration likely contributing.  UDS positive for benzodiazepines, was noted to be abusing benzodiazepines in the hospital, hydrate, supportive care, PT OT and monitor.  Follow cultures closely.  UA stable, head CT stable, no headache or focal deficits, no aches or pains.  Continue to monitor with IV fluids.  As needed  Haldol  for agitation.  -Remains altered and confused, has been more somnolent, this has improved after decreasing her Xanax  and Seroquel .   Severe sepsis, present on admission due to enteropathogenic E. coli diarrhea . No fever, tachycardia, tachypnea, or hypotension.  Mild lactic acidosis could be due to dehydration but given leukocytosis, does not appear toxic, taper down antibiotics and monitor.  Blood culture appears showing Staph epidermidis seem to be more than 1 set, as appears different organisms as it is with different sensitivities, so IV antibiotics has been discontinued.   AKI Rhabdomyolysis   Patient had a mechanical fall at home and reportedly on the floor for an extended period of time.  EKG peaked at 2721 creatinine stable.  Continue IV fluid hydration and trend CK. - she still appears dry, continue with IVF   Diarrhea GI pathogen panel positive for enteropathogenic E. coli, being hydrated, discussed with ID no need for treatment unless diarrhea get significantly worse.  Diarrhea has much improved   Elevated troponin, CAD status post CABG 01/27/2024  Troponin 51> 66 in the setting of fall/rhabdomyolysis.  EKG without STEMI and patient is not endorsing anginal symptoms.  Flat and low troponin in non-ACS pattern.  Remains symptom-free, continue aspirin , Plavix , and metoprolol .  Will resume statin once LFTs have stabilized.   Elevated liver enzymes  In the setting of rhabdomyolysis. AST 86, ALT 75, alk phos 148, T. bili 0.9.  Continue to monitor LFTs.      Hypomagnesemia, hypophosphatemia QT prolongation  Replace magnesium  and phosphorus and monitor labs.  Monitor on telemetry.  Hypotension - Blood pressure significantly low yesterday, improved with IV albumin  and IV fluid bolus . - Remains on the lower side today, will start on low-dose midodrine  and IV fluids    Acute to subacute L2 superior endplate compression fracture, chronic T11 fracture.  Secondary to a mechanical fall.  Patient is not endorsing any pain at this time and appears comfortable.  PT/OT eval. TLSO brace.  Discussed with Dr. Joshua neurosurgery who does not think she needs surgical intervention. - Started on calcium  and vitamin D    Fall  PT/OT eval, fall precautions.  TOC consulted for placement.   Paroxysmal A-fib  Not on anticoagulation.  Continue metoprolol .  Does appear she had only 1 event after her cardiac surgery with no recurrence   Hypothyroidism  Continue Synthroid .   Severe mitral regurgitation  Patient has previously declined further cardiac workup.   GERD  Continue Protonix .  Hypomagnesemia - Replaced  Pressure ulcers, POA: -Patient ulcers from being on the floor for extended period of time, chin, right cheek, bilateral knees, continue with local wound care    Patient Lines/Drains/Airways Status     Active Line/Drains/Airways     Name Placement date Placement time Site Days   Peripheral IV 06/02/24 20 G Posterior;Right Hand 06/02/24  --  Hand  2   Peripheral IV 06/02/24 22 G 1.75 Anterior;Right Forearm 06/02/24  2152  Forearm  2   External Urinary Catheter 06/03/24  1300  --  1   Wound 04/11/24 1900 Traumatic Back Left;Upper 04/11/24  1900  Back  54   Wound 06/03/24 1051 Traumatic Face Right 06/03/24  1051  Face  1   Wound 06/03/24 1053 Traumatic Face Medial 06/03/24  1053  Face  1   Wound 06/03/24 1054 Traumatic Knee Right 06/03/24  1054  Knee  1   Wound 06/03/24 1102 Knee Left 06/03/24  1102  Knee  1  Condition  -  Guarded  Family Communication  : None at bedside  Code Status :  Full  Consults  : ID Dr. Dennise over the phone  PUD Prophylaxis :     Procedures  :     CT head - non acute  CT chest abdomen pelvis.  Nonacute.  CT T and L-spine.  1.  Acute to subacute L2 superior endplate compression fracture. 2. No acute displaced fracture or traumatic listhesis of the cervical and thoracic spine. Chronic stable T11 compression fracture. 3. Diffusely decreased bone density.      Disposition Plan  :    Status is: Inpatient   DVT Prophylaxis  :    enoxaparin  (LOVENOX ) injection 40 mg Start: 06/04/24 1400    Lab Results  Component Value Date   PLT 275 06/09/2024    Diet :  Diet Order             DIET SOFT Fluid consistency: Thin  Diet effective now                    Inpatient Medications  Scheduled Meds:  ALPRAZolam   0.5 mg Oral BID   clopidogrel   75 mg Oral Daily   enoxaparin  (LOVENOX ) injection  40 mg Subcutaneous Q24H   escitalopram   10 mg Oral Daily   hydrOXYzine   10 mg Oral QHS   latanoprost   1 drop Both Eyes QHS   levothyroxine   88 mcg Oral Q0600   midodrine   5 mg Oral TID WC   pantoprazole   40 mg Oral Daily   QUEtiapine   50 mg Oral QHS   Continuous Infusions:  sodium chloride       PRN Meds:.glucagon  (human recombinant), hydrALAZINE , ipratropium-albuterol , metoprolol  tartrate, polyethylene glycol  Antibiotics  :    Anti-infectives (From admission, onward)    Start     Dose/Rate Route Frequency Ordered Stop   06/05/24 1400  ceFAZolin  (ANCEF ) IVPB 2g/100 mL premix  Status:  Discontinued        2 g 200 mL/hr over 30 Minutes Intravenous Every 8 hours 06/05/24 0948 06/07/24 0521   06/04/24 0100  cefTRIAXone  (ROCEPHIN ) 2 g in sodium chloride  0.9 % 100 mL IVPB  Status:  Discontinued        2 g 200 mL/hr over 30 Minutes Intravenous Every 24 hours 06/04/24 0027 06/05/24 0948   06/03/24 0015  ceFEPIme  (MAXIPIME ) 2 g in sodium chloride  0.9 % 100 mL IVPB         2 g 200 mL/hr over 30 Minutes Intravenous  Once 06/03/24 0010 06/03/24 0057   06/03/24 0015  metroNIDAZOLE  (FLAGYL ) IVPB 500 mg        500 mg 100 mL/hr over 60 Minutes Intravenous  Once 06/03/24 0010 06/03/24 0202   06/03/24 0015  vancomycin  (VANCOCIN ) IVPB 1000 mg/200 mL premix        1,000 mg 200 mL/hr over 60 Minutes Intravenous  Once 06/03/24 0010 06/03/24 0228         Objective:   Vitals:   06/09/24 0000 06/09/24 0400 06/09/24 0843 06/09/24 1117  BP: (!) 112/56 126/74 (!) 92/44 (!) 90/59  Pulse: 79 84 85 87  Resp: 13 12 20 17   Temp: 98.5 F (36.9 C) 98.3 F (36.8 C) 98.1 F (36.7 C) 98 F (36.7 C)  TempSrc: Oral Oral Oral Oral  SpO2: 93% 94% 93% 95%  Weight:      Height:        Wt Readings from Last 3  Encounters:  06/03/24 52.5 kg  05/11/24 49.9 kg  04/16/24 49.5 kg     Intake/Output Summary (Last 24 hours) at 06/09/2024 1221 Last data filed at 06/09/2024 0400 Gross per 24 hour  Intake --  Output 1900 ml  Net -1900 ml     Physical Exam Awake Alert, Oriented X 3, coherent and appropriate today, frail Symmetrical Chest wall movement, Good air movement bilaterally, CTAB RRR,No Gallops,Rubs or new Murmurs, No Parasternal Heave +ve B.Sounds, Abd Soft, No tenderness, No rebound - guarding or rigidity. No Cyanosis, Clubbing or edema, No new Rash or bruise             Data Review:    Recent Labs  Lab 06/02/24 2144 06/02/24 2341 06/03/24 1213 06/04/24 0451 06/05/24 1235 06/07/24 0234 06/08/24 0321 06/09/24 0350  WBC 7.9 18.5*   < > 10.3 11.3* 8.4 9.0 7.3  HGB 5.0* 12.5   < > 10.6* 12.0 10.5* 10.2* 10.0*  HCT 16.8* 38.1   < > 32.6* 36.3 32.0* 30.9* 29.9*  PLT 148* 334   < > 281 306 307 278 275  MCV 104.3* 94.5   < > 95.0 94.8 94.1 94.2 94.9  MCH 31.1 31.0   < > 30.9 31.3 30.9 31.1 31.7  MCHC 29.8* 32.8   < > 32.5 33.1 32.8 33.0 33.4  RDW 18.4* 18.6*   < > 18.6* 18.4* 18.3* 18.3* 18.6*  LYMPHSABS 0.4* 1.1  --   --   --   --   --   --    MONOABS 0.3 1.1*  --   --   --   --   --   --   EOSABS 0.0 1.1*  --   --   --   --   --   --   BASOSABS 0.0 0.1  --   --   --   --   --   --    < > = values in this interval not displayed.    Recent Labs  Lab 06/02/24 2138 06/02/24 2144 06/02/24 2155 06/02/24 2341 06/03/24 0127 06/03/24 0259 06/03/24 0351 06/03/24 1213 06/04/24 0451 06/05/24 1903 06/07/24 0234 06/08/24 0321 06/09/24 0350  NA 144  144   < >  --   --   --  SPECIMEN CONTAMINATED, UNABLE TO PERFORM TEST(S). 142 135 142 141 138 138  --   K 3.9  3.9   < >  --   --   --  SPECIMEN CONTAMINATED, UNABLE TO PERFORM TEST(S). 2.3* 4.5 4.2 4.3 3.8 4.3  --   CL 106   < >  --   --   --  SPECIMEN CONTAMINATED, UNABLE TO PERFORM TEST(S). 113* 106 109 103 99 99  --   CO2  --    < >  --   --   --  SPECIMEN CONTAMINATED, UNABLE TO PERFORM TEST(S). 20* 23 23 25 28 25   --   ANIONGAP  --    < >  --   --   --  SPECIMEN CONTAMINATED, UNABLE TO PERFORM TEST(S). 9 6 10 13 11 14   --   GLUCOSE 106*   < >  --   --   --  SPECIMEN CONTAMINATED, UNABLE TO PERFORM TEST(S). 96 91 82 87 93 111*  --   BUN 37*   < >  --   --   --  SPECIMEN CONTAMINATED, UNABLE TO PERFORM TEST(S). 18 17 19 11 9 12   --  CREATININE 0.60   < >  --   --   --  SPECIMEN CONTAMINATED, UNABLE TO PERFORM TEST(S). 0.60 0.78 1.07* 0.71 1.16* 0.87  --   AST  --    < >  --   --   --  SPECIMEN CONTAMINATED, UNABLE TO PERFORM TEST(S). 191*  --  204*  --  127* 123*  --   ALT  --    < >  --   --   --  SPECIMEN CONTAMINATED, UNABLE TO PERFORM TEST(S). 166*  --  171*  --  81* 65*  --   ALKPHOS  --    < >  --   --   --  SPECIMEN CONTAMINATED, UNABLE TO PERFORM TEST(S). 83  --  101  --  93 90  --   BILITOT  --    < >  --   --   --  SPECIMEN CONTAMINATED, UNABLE TO PERFORM TEST(S). 1.5*  --  0.7  --  0.5 0.6  --   ALBUMIN   --    < >  --   --   --  SPECIMEN CONTAMINATED, UNABLE TO PERFORM TEST(S). 2.0*  --  2.1*  --  2.1* 2.2*  --   CRP  --   --   --   --   --   --   --   --   --   --  0.7  0.9  --   PROCALCITON  --   --   --   --   --   --  0.11  --   --   --  <0.10 <0.10 <0.10  LATICACIDVEN 2.7*  --   --   --  1.0  --   --   --   --   --   --   --   --   TSH  --   --  1.320  --   --   --   --   --   --   --   --   --   --   AMMONIA  --   --   --  18  --   --   --   --   --   --   --   --   --   MG  --    < >  --   --   --   --  2.9* 2.8* 2.0 1.9 1.7 1.6* 2.3  PHOS  --   --   --   --   --   --   --  1.4* 1.6* 3.7 3.0 3.0  --   CALCIUM   --    < >  --   --   --  SPECIMEN CONTAMINATED, UNABLE TO PERFORM TEST(S). 6.7* 8.3* 8.1* 8.1* 8.4* 8.7*  --    < > = values in this interval not displayed.      Recent Labs  Lab 06/02/24 2138 06/02/24 2144 06/02/24 2155 06/02/24 2341 06/03/24 0127 06/03/24 0259 06/03/24 0351 06/03/24 1213 06/04/24 0451 06/05/24 1903 06/07/24 0234 06/08/24 0321 06/09/24 0350  CRP  --   --   --   --   --   --   --   --   --   --  0.7 0.9  --   PROCALCITON  --   --   --   --   --   --  0.11  --   --   --  <  0.10 <0.10 <0.10  LATICACIDVEN 2.7*  --   --   --  1.0  --   --   --   --   --   --   --   --   TSH  --   --  1.320  --   --   --   --   --   --   --   --   --   --   AMMONIA  --   --   --  18  --   --   --   --   --   --   --   --   --   MG  --    < >  --   --   --   --  2.9* 2.8* 2.0 1.9 1.7 1.6* 2.3  CALCIUM   --    < >  --   --   --    < > 6.7* 8.3* 8.1* 8.1* 8.4* 8.7*  --    < > = values in this interval not displayed.    --------------------------------------------------------------------------------------------------------------- Lab Results  Component Value Date   CHOL 237 (H) 01/24/2024   HDL 62 01/24/2024   LDLCALC 157 (H) 01/24/2024   TRIG 92 01/24/2024   CHOLHDL 3.8 01/24/2024    Lab Results  Component Value Date   HGBA1C 5.6 01/24/2024   No results for input(s): TSH, T4TOTAL, FREET4, T3FREE, THYROIDAB in the last 72 hours.  No results for input(s): VITAMINB12, FOLATE, FERRITIN, TIBC, IRON, RETICCTPCT  in the last 72 hours. ------------------------------------------------------------------------------------------------------------------ Cardiac Enzymes No results for input(s): CKMB, TROPONINI, MYOGLOBIN in the last 168 hours.  Invalid input(s): CK  Micro Results Recent Results (from the past 240 hours)  Blood culture (routine x 2)     Status: Abnormal   Collection Time: 06/02/24  9:55 PM   Specimen: BLOOD RIGHT FOREARM  Result Value Ref Range Status   Specimen Description BLOOD RIGHT FOREARM  Final   Special Requests   Final    BOTTLES DRAWN AEROBIC AND ANAEROBIC Blood Culture adequate volume   Culture  Setup Time   Final    GRAM POSITIVE COCCI ANAEROBIC BOTTLE ONLY CRITICAL RESULT CALLED TO, READ BACK BY AND VERIFIED WITH: PHARMD J CARNEY 06/03/2024 @ 2136 BY AB Performed at [facility], [address] [place], [place] [address]    Culture STAPHYLOCOCCUS EPIDERMIDIS (A)  Final   Report Status 06/06/2024 FINAL  Final   Organism ID, Bacteria STAPHYLOCOCCUS EPIDERMIDIS  Final      Susceptibility   Staphylococcus epidermidis - MIC*    CIPROFLOXACIN 4 RESISTANT Resistant     ERYTHROMYCIN <=0.25 SENSITIVE Sensitive     GENTAMICIN <=0.5 SENSITIVE Sensitive     OXACILLIN <=0.25 SENSITIVE Sensitive     TETRACYCLINE 2 SENSITIVE Sensitive     VANCOMYCIN  1 SENSITIVE Sensitive     TRIMETH /SULFA  80 RESISTANT Resistant     CLINDAMYCIN <=0.25 SENSITIVE Sensitive     RIFAMPIN <=0.5 SENSITIVE Sensitive     Inducible Clindamycin NEGATIVE Sensitive     * STAPHYLOCOCCUS EPIDERMIDIS  Blood culture (routine x 2)     Status: Abnormal   Collection Time: 06/02/24  9:55 PM   Specimen: BLOOD RIGHT ARM  Result Value Ref Range Status   Specimen Description BLOOD RIGHT ARM  Final   Special Requests   Final    BOTTLES DRAWN AEROBIC ONLY Blood Culture adequate volume   Culture  Setup Time  Final    GRAM POSITIVE COCCI IN CLUSTERS AEROBIC BOTTLE ONLY CRITICAL VALUE NOTED.  VALUE IS  CONSISTENT WITH PREVIOUSLY REPORTED AND CALLED VALUE. Performed at [facility], [address], [place], [place] [address]    Culture STAPHYLOCOCCUS EPIDERMIDIS (A)  Final   Report Status 06/06/2024 FINAL  Final   Organism ID, Bacteria STAPHYLOCOCCUS EPIDERMIDIS  Final      Susceptibility   Staphylococcus epidermidis - MIC*    CIPROFLOXACIN >=8 RESISTANT Resistant     ERYTHROMYCIN >=8 RESISTANT Resistant     GENTAMICIN 8 INTERMEDIATE Intermediate     OXACILLIN >=4 RESISTANT Resistant     TETRACYCLINE 2 SENSITIVE Sensitive     VANCOMYCIN  4 SENSITIVE Sensitive     TRIMETH /SULFA  80 RESISTANT Resistant     CLINDAMYCIN >=8 RESISTANT Resistant     RIFAMPIN <=0.5 SENSITIVE Sensitive     Inducible Clindamycin NEGATIVE Sensitive     * STAPHYLOCOCCUS EPIDERMIDIS  Blood Culture ID Panel (Reflexed)     Status: Abnormal   Collection Time: 06/02/24  9:55 PM  Result Value Ref Range Status   Enterococcus faecalis NOT DETECTED NOT DETECTED Final   Enterococcus Faecium NOT DETECTED NOT DETECTED Final   Listeria monocytogenes NOT DETECTED NOT DETECTED Final   Staphylococcus species DETECTED (A) NOT DETECTED Final    Comment: CRITICAL RESULT CALLED TO, READ BACK BY AND VERIFIED WITH: PHARMD J CARNEY 06/03/2024 @ 2136 BY AB    Staphylococcus aureus (BCID) NOT DETECTED NOT DETECTED Final   Staphylococcus epidermidis DETECTED (A) NOT DETECTED Final    Comment: CRITICAL RESULT CALLED TO, READ BACK BY AND VERIFIED WITH: PHARMD J CARNEY 06/03/2024 @ 2136 BY AB    Staphylococcus lugdunensis NOT DETECTED NOT DETECTED Final   Streptococcus species NOT DETECTED NOT DETECTED Final   Streptococcus agalactiae NOT DETECTED NOT DETECTED Final   Streptococcus pneumoniae NOT DETECTED NOT DETECTED Final   Streptococcus pyogenes NOT DETECTED NOT DETECTED Final   A.calcoaceticus-baumannii NOT DETECTED NOT DETECTED Final   Bacteroides fragilis NOT DETECTED NOT DETECTED Final   Enterobacterales NOT DETECTED NOT  DETECTED Final   Enterobacter cloacae complex NOT DETECTED NOT DETECTED Final   Escherichia coli NOT DETECTED NOT DETECTED Final   Klebsiella aerogenes NOT DETECTED NOT DETECTED Final   Klebsiella oxytoca NOT DETECTED NOT DETECTED Final   Klebsiella pneumoniae NOT DETECTED NOT DETECTED Final   Proteus species NOT DETECTED NOT DETECTED Final   Salmonella species NOT DETECTED NOT DETECTED Final   Serratia marcescens NOT DETECTED NOT DETECTED Final   Haemophilus influenzae NOT DETECTED NOT DETECTED Final   Neisseria meningitidis NOT DETECTED NOT DETECTED Final   Pseudomonas aeruginosa NOT DETECTED NOT DETECTED Final   Stenotrophomonas maltophilia NOT DETECTED NOT DETECTED Final   Candida albicans NOT DETECTED NOT DETECTED Final   Candida auris NOT DETECTED NOT DETECTED Final   Candida glabrata NOT DETECTED NOT DETECTED Final   Candida krusei NOT DETECTED NOT DETECTED Final   Candida parapsilosis NOT DETECTED NOT DETECTED Final   Candida tropicalis NOT DETECTED NOT DETECTED Final   Cryptococcus neoformans/gattii NOT DETECTED NOT DETECTED Final   Methicillin resistance mecA/C NOT DETECTED NOT DETECTED Final    Comment: Performed at [facility], [address], [place], [place] [address]  Urine Culture (for pregnant, neutropenic or urologic patients or patients with an indwelling urinary catheter)     Status: None   Collection Time: 06/03/24  1:36 AM   Specimen: Urine, Clean Catch  Result Value Ref  Range Status   Specimen Description URINE, CLEAN CATCH  Final   Special Requests NONE  Final   Culture   Final    NO GROWTH Performed at [facility], [address], [place], [place] [address]    Report Status 06/04/2024 FINAL  Final  C Difficile Quick Screen w PCR reflex     Status: None   Collection Time: 06/03/24  1:36 AM   Specimen: STOOL  Result Value Ref Range Status   C Diff antigen NEGATIVE NEGATIVE Final   C Diff toxin NEGATIVE NEGATIVE Final   C Diff interpretation  No C. difficile detected.  Final    Comment: Performed at [facility], [address], [place], [place] [address]  Gastrointestinal Panel by PCR , Stool     Status: Abnormal   Collection Time: 06/03/24  1:36 AM   Specimen: Stool  Result Value Ref Range Status   Campylobacter species NOT DETECTED NOT DETECTED Final   Plesimonas shigelloides NOT DETECTED NOT DETECTED Final   Salmonella species NOT DETECTED NOT DETECTED Final   Yersinia enterocolitica NOT DETECTED NOT DETECTED Final   Vibrio species NOT DETECTED NOT DETECTED Final   Vibrio cholerae NOT DETECTED NOT DETECTED Final   Enteroaggregative E coli (EAEC) NOT DETECTED NOT DETECTED Final   Enteropathogenic E coli (EPEC) DETECTED (A) NOT DETECTED Final    Comment: RESULT CALLED TO, READ BACK BY AND VERIFIED WITH: Toneshia SCHAPYE 06/03/24 2314 KLW    Enterotoxigenic E coli (ETEC) NOT DETECTED NOT DETECTED Final   Shiga like toxin producing E coli (STEC) NOT DETECTED NOT DETECTED Final   Shigella/Enteroinvasive E coli (EIEC) NOT DETECTED NOT DETECTED Final   Cryptosporidium NOT DETECTED NOT DETECTED Final   Cyclospora cayetanensis NOT DETECTED NOT DETECTED Final   Entamoeba histolytica NOT DETECTED NOT DETECTED Final   Giardia lamblia NOT DETECTED NOT DETECTED Final   Adenovirus F40/41 NOT DETECTED NOT DETECTED Final   Astrovirus NOT DETECTED NOT DETECTED Final   Norovirus GI/GII NOT DETECTED NOT DETECTED Final   Rotavirus A NOT DETECTED NOT DETECTED Final   Sapovirus (I, II, IV, and V) NOT DETECTED NOT DETECTED Final    Comment: Performed at [facility], [address] [place], [place] [address]  Culture, blood (Routine X 2) w Reflex to ID Panel     Status: None   Collection Time: 06/04/24  2:41 PM   Specimen: BLOOD RIGHT HAND  Result Value Ref Range Status   Specimen Description BLOOD RIGHT HAND  Final   Special Requests   Final    BOTTLES DRAWN AEROBIC ONLY Blood Culture results may not be optimal due to an  inadequate volume of blood received in culture bottles   Culture   Final    NO GROWTH 5 DAYS Performed at [facility], [address], [place], [place] [address]    Report Status 06/09/2024 FINAL  Final    Radiology Report No results found.    Signature  -   Brayton Lye M.D on 06/09/2024 at 12:21 PM   -  To page go to [email or web address]

## 2024-06-09 NOTE — TOC Progression Note (Signed)
 Transition of Care [facility]) - Progression Note    Patient Details  Name: Kaitlyn Wright MRN: [id number] Date of Birth: 11/19/1945  [facility] CM/SW Contact  Inocente GORMAN Kindle, LCSW Phone Number: 06/09/2024, 10:36 AM  Clinical Narrative:    CSW met with patient. She has selected [facility]. CSW contacted insurance. Authorization still pending.    Expected Discharge Plan: Skilled Nursing Facility Barriers to Discharge: Continued Medical Work up, English as a second language teacher               Expected Discharge Plan and Services In-house Referral: Clinical Social Work   Post Acute Care Choice: Skilled Nursing Facility Living arrangements for the past 2 months: Apartment                                       Social Drivers of Health (SDOH) Interventions SDOH Screenings   Food Insecurity: No Food Insecurity (06/03/2024)  Housing: Low Risk  (06/03/2024)  Transportation Needs: No Transportation Needs (06/03/2024)  Utilities: Not At Risk (06/03/2024)  Social Connections: Moderately Isolated (06/03/2024)  Tobacco Use: Low Risk  (06/03/2024)    Readmission Risk Interventions     No data to display

## 2024-06-10 DIAGNOSIS — G9341 Metabolic encephalopathy: Secondary | ICD-10-CM | POA: Diagnosis not present

## 2024-06-10 LAB — BASIC METABOLIC PANEL WITH GFR
Anion gap: 5 (ref 5–15)
BUN: 14 mg/dL (ref 8–23)
CO2: 26 mmol/L (ref 22–32)
Calcium: 8.7 mg/dL — ABNORMAL LOW (ref 8.9–10.3)
Chloride: 105 mmol/L (ref 98–111)
Creatinine, Ser: 0.79 mg/dL (ref 0.44–1.00)
GFR, Estimated: >60 mL/min (ref >60)
Glucose, Bld: 100 mg/dL — ABNORMAL HIGH (ref 70–99)
Potassium: 4.2 mmol/L (ref 3.5–5.1)
Sodium: 136 mmol/L (ref 135–145)

## 2024-06-10 LAB — GLUCOSE, CAPILLARY
Glucose-Capillary: 102 mg/dL — ABNORMAL HIGH (ref 70–99)
Glucose-Capillary: 106 mg/dL — ABNORMAL HIGH (ref 70–99)
Glucose-Capillary: 112 mg/dL — ABNORMAL HIGH (ref 70–99)
Glucose-Capillary: 140 mg/dL — ABNORMAL HIGH (ref 70–99)
Glucose-Capillary: 88 mg/dL (ref 70–99)
Glucose-Capillary: 97 mg/dL (ref 70–99)

## 2024-06-10 LAB — CBC
HCT: 30.9 % — ABNORMAL LOW (ref 36.0–46.0)
Hemoglobin: 10.1 g/dL — ABNORMAL LOW (ref 12.0–15.0)
MCH: 31.2 pg (ref 26.0–34.0)
MCHC: 32.7 g/dL (ref 30.0–36.0)
MCV: 95.4 fL (ref 80.0–100.0)
Platelets: 302 K/uL (ref 150–400)
RBC: 3.24 MIL/uL — ABNORMAL LOW (ref 3.87–5.11)
RDW: 18.8 % — ABNORMAL HIGH (ref 11.5–15.5)
WBC: 8.5 K/uL (ref 4.0–10.5)
nRBC: 0 % (ref 0.0–0.2)

## 2024-06-10 LAB — MAGNESIUM: Magnesium: 1.8 mg/dL (ref 1.7–2.4)

## 2024-06-10 MED ORDER — MIDODRINE HCL 5 MG PO TABS
10.0000 mg | ORAL_TABLET | Freq: Three times a day (TID) | ORAL | Status: DC
  Administered 2024-06-10 – 2024-06-11 (×3): 10 mg via ORAL
  Filled 2024-06-10 (×3): qty 2

## 2024-06-10 MED ORDER — COSYNTROPIN 0.25 MG IJ SOLR
0.2500 mg | Freq: Once | INTRAMUSCULAR | Status: AC
  Administered 2024-06-11: 0.25 mg via INTRAVENOUS
  Filled 2024-06-10: qty 0.25

## 2024-06-10 NOTE — TOC Progression Note (Signed)
 Transition of Care [facility]) - Progression Note    Patient Details  Name: Kaitlyn Wright MRN: [id number] Date of Birth: 1945-12-29  Transition of Care [facility]) CM/SW Contact  Dyshon Philbin E Severiano Utsey, LCSW Phone Number: 06/10/2024, 9:06 AM  Clinical Narrative:    Per MD, patient not medically ready to DC to SNF today. Possibly tomorrow. CSW notified Myrene and Tanya in Admissions at [facility].   Expected Discharge Plan: Skilled Nursing Facility Barriers to Discharge: Continued Medical Work up, English as a second language teacher               Expected Discharge Plan and Services In-house Referral: Clinical Social Work   Post Acute Care Choice: Skilled Nursing Facility Living arrangements for the past 2 months: Apartment                                       Social Drivers of Health (SDOH) Interventions SDOH Screenings   Food Insecurity: No Food Insecurity (06/03/2024)  Housing: Low Risk  (06/03/2024)  Transportation Needs: No Transportation Needs (06/03/2024)  Utilities: Not At Risk (06/03/2024)  Social Connections: Moderately Isolated (06/03/2024)  Tobacco Use: Low Risk  (06/03/2024)    Readmission Risk Interventions     No data to display

## 2024-06-10 NOTE — Plan of Care (Signed)

## 2024-06-10 NOTE — Progress Notes (Signed)
 PROGRESS NOTE                                                                                                                                                                                                             Patient Demographics:    Kaitlyn Wright, is a 78 y.o. female, DOB - 29-Apr-1946, FMW:[id number]  Outpatient Primary MD for the patient is Arloa Elsie SAUNDERS, MD    LOS - 7  Admit date - 06/02/2024    Chief Complaint  Patient presents with   Fall on Plavix    Code Sepsis       Brief Narrative (HPI from H&P)     78 y.o. female with medical history significant of CAD status post CABG 01/27/2024 on aspirin  and Plavix , complicated by postop paroxysmal A-fib not on anticoagulation, elevated LFTs leading to discontinuation of amiodarone  towards the end of June, hypothyroidism, severe mitral regurgitation, cervical spondylosis and multilevel foraminal stenosis, anxiety/depression presenting to the ED via EMS for evaluation of altered mental status.  Patient was found facedown on the ground at home by a family friend.  Reportedly family is estranged and has not talked to the patient for weeks.  Patient last spoke to a friend on Tuesday and nobody had heard from her since.  She was found covered in urine and feces and surrounded by cockroaches/insects per EMS.  Altered with EMS, spontaneously moving her extremities but would not follow commands, got combative and was given Versed .    She did have similar admission in July of this year with unknown cause of encephalopathy, found to be hypotensive requiring vasopressors and subsequently discharged on midodrine .  She was noted to have severe MR, however, declined further cardiac workup.  Skilled nursing facility was recommended but patient had elected to be discharged back home with PT/OT.    Subjective:   Reports good appetite, no nausea, no vomiting, no diarrhea, she reports some  dizziness upon standing up initially   Assessment  & Plan :   Fall, dehydration, rhabdomyolysis, multiple skin lacerations and abrasions.  Acute metabolic encephalopathy  Dehydration likely contributing.  UDS positive for benzodiazepines, was noted to be abusing benzodiazepines in the hospital, hydrate, supportive care, PT OT and monitor.  Follow cultures closely.  UA stable, head CT stable, no headache or focal deficits, no aches or pains.  Continue  to monitor with IV fluids.  As needed Haldol  for agitation.  -Remains altered and confused, has been more somnolent, this has improved after decreasing her Xanax  and Seroquel .   Severe sepsis, present on admission due to enteropathogenic E. coli diarrhea . No fever, tachycardia, tachypnea, or hypotension.  Mild lactic acidosis could be due to dehydration but given leukocytosis, does not appear toxic, taper down antibiotics and monitor.  Blood culture appears showing Staph epidermidis seem to be more than 1 set, as appears different organisms as it is with different sensitivities, so IV antibiotics has been discontinued.   AKI Rhabdomyolysis   Patient had a mechanical fall at home and reportedly on the floor for an extended period of time.  EKG peaked at 2721 creatinine stable.  Continue IV fluid hydration and trend CK. - she still appears dry, continue with IVF  Hypotension - She remains with significant low blood pressure despite receiving albumin , IV fluids and starting on midodrine  . - Cortisol level yesterday morning was low at 5.9, will proceed with cosyntropin  test in a.m.     Diarrhea GI pathogen panel positive for enteropathogenic E. coli, being hydrated, discussed with ID no need for treatment unless diarrhea get significantly worse.  Diarrhea has much improved   Elevated troponin, CAD status post CABG 01/27/2024  Troponin 51> 66 in the setting of fall/rhabdomyolysis.  EKG without STEMI and patient is not endorsing anginal symptoms.  Flat  and low troponin in non-ACS pattern.  Remains symptom-free, continue aspirin , Plavix , and metoprolol .  Will resume statin once LFTs have stabilized.   Elevated liver enzymes  In the setting of rhabdomyolysis. AST 86, ALT 75, alk phos 148, T. bili 0.9.  Continue to monitor LFTs.     Hypomagnesemia, hypophosphatemia QT prolongation  Replace magnesium  and phosphorus and monitor labs.  Monitor on telemetry.  Acute to subacute L2 superior endplate compression fracture, chronic T11 fracture.  Secondary to a mechanical fall.  Patient is not endorsing any pain at this time and appears comfortable.  PT/OT eval. TLSO brace.  Discussed with Dr. Joshua neurosurgery who does not think she needs surgical intervention. - Started on calcium  and vitamin D    Fall  PT/OT eval, fall precautions.  TOC consulted for placement.   Paroxysmal A-fib  Not on anticoagulation.  Continue metoprolol .  Does appear she had only 1 event after her cardiac surgery with no recurrence   Hypothyroidism  Continue Synthroid .   Severe mitral regurgitation  Patient has previously declined further cardiac workup.   GERD  Continue Protonix .  Hypomagnesemia - Replaced  Pressure ulcers, POA: -Patient ulcers from being on the floor for extended period of time, chin, right cheek, bilateral knees, continue with local wound care    Patient Lines/Drains/Airways Status     Active Line/Drains/Airways     Name Placement date Placement time Site Days   Peripheral IV 06/02/24 20 G Posterior;Right Hand 06/02/24  --  Hand  2   Peripheral IV 06/02/24 22 G 1.75 Anterior;Right Forearm 06/02/24  2152  Forearm  2   External Urinary Catheter 06/03/24  1300  --  1   Wound 04/11/24 1900 Traumatic Back Left;Upper 04/11/24  1900  Back  54   Wound 06/03/24 1051 Traumatic Face Right 06/03/24  1051  Face  1   Wound 06/03/24 1053 Traumatic Face Medial 06/03/24  1053  Face  1   Wound 06/03/24 1054 Traumatic Knee Right 06/03/24  1054  Knee  1   Wound  06/03/24 1102  Knee Left 06/03/24  1102  Knee  1                     Condition -  Guarded  Family Communication  : None at bedside  Code Status :  Full  Consults  : ID Dr. Dennise over the phone  PUD Prophylaxis :     Procedures  :     CT head - non acute  CT chest abdomen pelvis.  Nonacute.  CT T and L-spine.  1.  Acute to subacute L2 superior endplate compression fracture. 2. No acute displaced fracture or traumatic listhesis of the cervical and thoracic spine. Chronic stable T11 compression fracture. 3. Diffusely decreased bone density.      Disposition Plan  :    Status is: Inpatient   DVT Prophylaxis  :    enoxaparin  (LOVENOX ) injection 40 mg Start: 06/04/24 1400    Lab Results  Component Value Date   PLT 302 06/10/2024    Diet :  Diet Order             DIET SOFT Fluid consistency: Thin  Diet effective now                    Inpatient Medications  Scheduled Meds:  ALPRAZolam   0.5 mg Oral BID   bacitracin    Topical BID   clopidogrel   75 mg Oral Daily   [START ON 06/11/2024] cosyntropin   0.25 mg Intravenous Once   enoxaparin  (LOVENOX ) injection  40 mg Subcutaneous Q24H   escitalopram   10 mg Oral Daily   hydrOXYzine   10 mg Oral QHS   latanoprost   1 drop Both Eyes QHS   levothyroxine   88 mcg Oral Q0600   midodrine   5 mg Oral TID WC   pantoprazole   40 mg Oral Daily   QUEtiapine   50 mg Oral QHS   Continuous Infusions:    PRN Meds:.glucagon  (human recombinant), hydrALAZINE , ipratropium-albuterol , metoprolol  tartrate, polyethylene glycol  Antibiotics  :    Anti-infectives (From admission, onward)    Start     Dose/Rate Route Frequency Ordered Stop   06/05/24 1400  ceFAZolin  (ANCEF ) IVPB 2g/100 mL premix  Status:  Discontinued        2 g 200 mL/hr over 30 Minutes Intravenous Every 8 hours 06/05/24 0948 06/07/24 0521   06/04/24 0100  cefTRIAXone  (ROCEPHIN ) 2 g in sodium chloride  0.9 % 100 mL IVPB  Status:  Discontinued        2 g 200  mL/hr over 30 Minutes Intravenous Every 24 hours 06/04/24 0027 06/05/24 0948   06/03/24 0015  ceFEPIme  (MAXIPIME ) 2 g in sodium chloride  0.9 % 100 mL IVPB        2 g 200 mL/hr over 30 Minutes Intravenous  Once 06/03/24 0010 06/03/24 0057   06/03/24 0015  metroNIDAZOLE  (FLAGYL ) IVPB 500 mg        500 mg 100 mL/hr over 60 Minutes Intravenous  Once 06/03/24 0010 06/03/24 0202   06/03/24 0015  vancomycin  (VANCOCIN ) IVPB 1000 mg/200 mL premix        1,000 mg 200 mL/hr over 60 Minutes Intravenous  Once 06/03/24 0010 06/03/24 0228         Objective:   Vitals:   06/09/24 2021 06/09/24 2329 06/10/24 0400 06/10/24 0839  BP:  (!) 95/38 (!) 90/43 (!) 115/56  Pulse:  88 77 92  Resp:  19 14 14   Temp: 98.7 F (37.1 C) 98.5 F (36.9  C) 98.2 F (36.8 C) 98.3 F (36.8 C)  TempSrc: Oral Oral Oral Oral  SpO2:  94% 92% 95%  Weight:      Height:        Wt Readings from Last 3 Encounters:  06/03/24 52.5 kg  05/11/24 49.9 kg  04/16/24 49.5 kg     Intake/Output Summary (Last 24 hours) at 06/10/2024 1302 Last data filed at 06/10/2024 0520 Gross per 24 hour  Intake --  Output 400 ml  Net -400 ml     Physical Exam Awake Alert, Oriented X 3, No new F.N deficits, Normal affect Symmetrical Chest wall movement, Good air movement bilaterally, CTAB RRR,No Gallops,Rubs or new Murmurs, No Parasternal Heave +ve B.Sounds, Abd Soft, No tenderness, No rebound - guarding or rigidity. No Cyanosis, Clubbing or edema, No new Rash or bruise             Data Review:    Recent Labs  Lab 06/05/24 1235 06/07/24 0234 06/08/24 0321 06/09/24 0350 06/10/24 0209  WBC 11.3* 8.4 9.0 7.3 8.5  HGB 12.0 10.5* 10.2* 10.0* 10.1*  HCT 36.3 32.0* 30.9* 29.9* 30.9*  PLT 306 307 278 275 302  MCV 94.8 94.1 94.2 94.9 95.4  MCH 31.3 30.9 31.1 31.7 31.2  MCHC 33.1 32.8 33.0 33.4 32.7  RDW 18.4* 18.3* 18.3* 18.6* 18.8*    Recent Labs  Lab 06/04/24 0451 06/05/24 1903 06/07/24 0234 06/08/24 0321  06/09/24 0350 06/09/24 1049 06/10/24 0209  NA 142 141 138 138  --  137 136  K 4.2 4.3 3.8 4.3  --  4.1 4.2  CL 109 103 99 99  --  103 105  CO2 23 25 28 25   --  25 26  ANIONGAP 10 13 11 14   --  9 5  GLUCOSE 82 87 93 111*  --  79 100*  BUN 19 11 9 12   --  11 14  CREATININE 1.07* 0.71 1.16* 0.87  --  0.80 0.79  AST 204*  --  127* 123*  --   --   --   ALT 171*  --  81* 65*  --   --   --   ALKPHOS 101  --  93 90  --   --   --   BILITOT 0.7  --  0.5 0.6  --   --   --   ALBUMIN  2.1*  --  2.1* 2.2*  --   --   --   CRP  --   --  0.7 0.9  --   --   --   PROCALCITON  --   --  <0.10 <0.10 <0.10  --   --   MG 2.0 1.9 1.7 1.6* 2.3  --  1.8  PHOS 1.6* 3.7 3.0 3.0  --   --   --   CALCIUM  8.1* 8.1* 8.4* 8.7*  --  8.6* 8.7*      Recent Labs  Lab 06/05/24 1903 06/07/24 0234 06/08/24 0321 06/09/24 0350 06/09/24 1049 06/10/24 0209  CRP  --  0.7 0.9  --   --   --   PROCALCITON  --  <0.10 <0.10 <0.10  --   --   MG 1.9 1.7 1.6* 2.3  --  1.8  CALCIUM  8.1* 8.4* 8.7*  --  8.6* 8.7*    --------------------------------------------------------------------------------------------------------------- Lab Results  Component Value Date   CHOL 237 (H) 01/24/2024   HDL 62 01/24/2024   LDLCALC 157 (H) 01/24/2024   TRIG 92  01/24/2024   CHOLHDL 3.8 01/24/2024    Lab Results  Component Value Date   HGBA1C 5.6 01/24/2024   No results for input(s): TSH, T4TOTAL, FREET4, T3FREE, THYROIDAB in the last 72 hours.  No results for input(s): VITAMINB12, FOLATE, FERRITIN, TIBC, IRON, RETICCTPCT in the last 72 hours. ------------------------------------------------------------------------------------------------------------------ Cardiac Enzymes No results for input(s): CKMB, TROPONINI, MYOGLOBIN in the last 168 hours.  Invalid input(s): CK  Micro Results Recent Results (from the past 240 hours)  Blood culture (routine x 2)     Status: Abnormal   Collection Time: 06/02/24   9:55 PM   Specimen: BLOOD RIGHT FOREARM  Result Value Ref Range Status   Specimen Description BLOOD RIGHT FOREARM  Final   Special Requests   Final    BOTTLES DRAWN AEROBIC AND ANAEROBIC Blood Culture adequate volume   Culture  Setup Time   Final    GRAM POSITIVE COCCI ANAEROBIC BOTTLE ONLY CRITICAL RESULT CALLED TO, READ BACK BY AND VERIFIED WITH: PHARMD J CARNEY 06/03/2024 @ 2136 BY AB Performed at [facility], [address] [place], [place] [address]    Culture STAPHYLOCOCCUS EPIDERMIDIS (A)  Final   Report Status 06/06/2024 FINAL  Final   Organism ID, Bacteria STAPHYLOCOCCUS EPIDERMIDIS  Final      Susceptibility   Staphylococcus epidermidis - MIC*    CIPROFLOXACIN 4 RESISTANT Resistant     ERYTHROMYCIN <=0.25 SENSITIVE Sensitive     GENTAMICIN <=0.5 SENSITIVE Sensitive     OXACILLIN <=0.25 SENSITIVE Sensitive     TETRACYCLINE 2 SENSITIVE Sensitive     VANCOMYCIN  1 SENSITIVE Sensitive     TRIMETH /SULFA  80 RESISTANT Resistant     CLINDAMYCIN <=0.25 SENSITIVE Sensitive     RIFAMPIN <=0.5 SENSITIVE Sensitive     Inducible Clindamycin NEGATIVE Sensitive     * STAPHYLOCOCCUS EPIDERMIDIS  Blood culture (routine x 2)     Status: Abnormal   Collection Time: 06/02/24  9:55 PM   Specimen: BLOOD RIGHT ARM  Result Value Ref Range Status   Specimen Description BLOOD RIGHT ARM  Final   Special Requests   Final    BOTTLES DRAWN AEROBIC ONLY Blood Culture adequate volume   Culture  Setup Time   Final    GRAM POSITIVE COCCI IN CLUSTERS AEROBIC BOTTLE ONLY CRITICAL VALUE NOTED.  VALUE IS CONSISTENT WITH PREVIOUSLY REPORTED AND CALLED VALUE. Performed at [facility], [address], [place], [place] [address]    Culture STAPHYLOCOCCUS EPIDERMIDIS (A)  Final   Report Status 06/06/2024 FINAL  Final   Organism ID, Bacteria STAPHYLOCOCCUS EPIDERMIDIS  Final      Susceptibility   Staphylococcus epidermidis - MIC*    CIPROFLOXACIN >=8 RESISTANT Resistant     ERYTHROMYCIN >=8  RESISTANT Resistant     GENTAMICIN 8 INTERMEDIATE Intermediate     OXACILLIN >=4 RESISTANT Resistant     TETRACYCLINE 2 SENSITIVE Sensitive     VANCOMYCIN  4 SENSITIVE Sensitive     TRIMETH /SULFA  80 RESISTANT Resistant     CLINDAMYCIN >=8 RESISTANT Resistant     RIFAMPIN <=0.5 SENSITIVE Sensitive     Inducible Clindamycin NEGATIVE Sensitive     * STAPHYLOCOCCUS EPIDERMIDIS  Blood Culture ID Panel (Reflexed)     Status: Abnormal   Collection Time: 06/02/24  9:55 PM  Result Value Ref Range Status   Enterococcus faecalis NOT DETECTED NOT DETECTED Final   Enterococcus Faecium NOT DETECTED NOT DETECTED Final   Listeria monocytogenes NOT DETECTED NOT DETECTED Final  Staphylococcus species DETECTED (A) NOT DETECTED Final    Comment: CRITICAL RESULT CALLED TO, READ BACK BY AND VERIFIED WITH: PHARMD J CARNEY 06/03/2024 @ 2136 BY AB    Staphylococcus aureus (BCID) NOT DETECTED NOT DETECTED Final   Staphylococcus epidermidis DETECTED (A) NOT DETECTED Final    Comment: CRITICAL RESULT CALLED TO, READ BACK BY AND VERIFIED WITH: PHARMD J CARNEY 06/03/2024 @ 2136 BY AB    Staphylococcus lugdunensis NOT DETECTED NOT DETECTED Final   Streptococcus species NOT DETECTED NOT DETECTED Final   Streptococcus agalactiae NOT DETECTED NOT DETECTED Final   Streptococcus pneumoniae NOT DETECTED NOT DETECTED Final   Streptococcus pyogenes NOT DETECTED NOT DETECTED Final   A.calcoaceticus-baumannii NOT DETECTED NOT DETECTED Final   Bacteroides fragilis NOT DETECTED NOT DETECTED Final   Enterobacterales NOT DETECTED NOT DETECTED Final   Enterobacter cloacae complex NOT DETECTED NOT DETECTED Final   Escherichia coli NOT DETECTED NOT DETECTED Final   Klebsiella aerogenes NOT DETECTED NOT DETECTED Final   Klebsiella oxytoca NOT DETECTED NOT DETECTED Final   Klebsiella pneumoniae NOT DETECTED NOT DETECTED Final   Proteus species NOT DETECTED NOT DETECTED Final   Salmonella species NOT DETECTED NOT DETECTED Final    Serratia marcescens NOT DETECTED NOT DETECTED Final   Haemophilus influenzae NOT DETECTED NOT DETECTED Final   Neisseria meningitidis NOT DETECTED NOT DETECTED Final   Pseudomonas aeruginosa NOT DETECTED NOT DETECTED Final   Stenotrophomonas maltophilia NOT DETECTED NOT DETECTED Final   Candida albicans NOT DETECTED NOT DETECTED Final   Candida auris NOT DETECTED NOT DETECTED Final   Candida glabrata NOT DETECTED NOT DETECTED Final   Candida krusei NOT DETECTED NOT DETECTED Final   Candida parapsilosis NOT DETECTED NOT DETECTED Final   Candida tropicalis NOT DETECTED NOT DETECTED Final   Cryptococcus neoformans/gattii NOT DETECTED NOT DETECTED Final   Methicillin resistance mecA/C NOT DETECTED NOT DETECTED Final    Comment: Performed at [facility], [address], [place], [place] [address]  Urine Culture (for pregnant, neutropenic or urologic patients or patients with an indwelling urinary catheter)     Status: None   Collection Time: 06/03/24  1:36 AM   Specimen: Urine, Clean Catch  Result Value Ref Range Status   Specimen Description URINE, CLEAN CATCH  Final   Special Requests NONE  Final   Culture   Final    NO GROWTH Performed at [facility], [address], [place], [place] [address]    Report Status 06/04/2024 FINAL  Final  C Difficile Quick Screen w PCR reflex     Status: None   Collection Time: 06/03/24  1:36 AM   Specimen: STOOL  Result Value Ref Range Status   C Diff antigen NEGATIVE NEGATIVE Final   C Diff toxin NEGATIVE NEGATIVE Final   C Diff interpretation No C. difficile detected.  Final    Comment: Performed at [facility], [address], [place], [place] [address]  Gastrointestinal Panel by PCR , Stool     Status: Abnormal   Collection Time: 06/03/24  1:36 AM   Specimen: Stool  Result Value Ref Range Status   Campylobacter species NOT DETECTED NOT DETECTED Final   Plesimonas shigelloides NOT DETECTED NOT DETECTED Final   Salmonella  species NOT DETECTED NOT DETECTED Final   Yersinia enterocolitica NOT DETECTED NOT DETECTED Final   Vibrio species NOT DETECTED NOT DETECTED Final   Vibrio cholerae NOT DETECTED NOT DETECTED Final   Enteroaggregative E coli (  EAEC) NOT DETECTED NOT DETECTED Final   Enteropathogenic E coli (EPEC) DETECTED (A) NOT DETECTED Final    Comment: RESULT CALLED TO, READ BACK BY AND VERIFIED WITH: Jackalyn SCHAPYE 06/03/24 2314 KLW    Enterotoxigenic E coli (ETEC) NOT DETECTED NOT DETECTED Final   Shiga like toxin producing E coli (STEC) NOT DETECTED NOT DETECTED Final   Shigella/Enteroinvasive E coli (EIEC) NOT DETECTED NOT DETECTED Final   Cryptosporidium NOT DETECTED NOT DETECTED Final   Cyclospora cayetanensis NOT DETECTED NOT DETECTED Final   Entamoeba histolytica NOT DETECTED NOT DETECTED Final   Giardia lamblia NOT DETECTED NOT DETECTED Final   Adenovirus F40/41 NOT DETECTED NOT DETECTED Final   Astrovirus NOT DETECTED NOT DETECTED Final   Norovirus GI/GII NOT DETECTED NOT DETECTED Final   Rotavirus A NOT DETECTED NOT DETECTED Final   Sapovirus (I, II, IV, and V) NOT DETECTED NOT DETECTED Final    Comment: Performed at [facility], [address] [place], [place] [address]  Culture, blood (Routine X 2) w Reflex to ID Panel     Status: None   Collection Time: 06/04/24  2:41 PM   Specimen: BLOOD RIGHT HAND  Result Value Ref Range Status   Specimen Description BLOOD RIGHT HAND  Final   Special Requests   Final    BOTTLES DRAWN AEROBIC ONLY Blood Culture results may not be optimal due to an inadequate volume of blood received in culture bottles   Culture   Final    NO GROWTH 5 DAYS Performed at [facility], [address] [place], [place] [address]    Report Status 06/09/2024 FINAL  Final    Radiology Report No results found.    Signature  -   Brayton Lye M.D on 06/10/2024 at 1:02 PM   -  To page go to [email or web address]

## 2024-06-11 DIAGNOSIS — Z23 Encounter for immunization: Secondary | ICD-10-CM | POA: Diagnosis present

## 2024-06-11 DIAGNOSIS — R41841 Cognitive communication deficit: Secondary | ICD-10-CM | POA: Diagnosis not present

## 2024-06-11 DIAGNOSIS — K219 Gastro-esophageal reflux disease without esophagitis: Secondary | ICD-10-CM | POA: Diagnosis not present

## 2024-06-11 DIAGNOSIS — I4891 Unspecified atrial fibrillation: Secondary | ICD-10-CM | POA: Diagnosis not present

## 2024-06-11 DIAGNOSIS — R2689 Other abnormalities of gait and mobility: Secondary | ICD-10-CM | POA: Diagnosis not present

## 2024-06-11 DIAGNOSIS — M62838 Other muscle spasm: Secondary | ICD-10-CM | POA: Diagnosis not present

## 2024-06-11 DIAGNOSIS — G629 Polyneuropathy, unspecified: Secondary | ICD-10-CM | POA: Diagnosis not present

## 2024-06-11 DIAGNOSIS — F419 Anxiety disorder, unspecified: Secondary | ICD-10-CM | POA: Diagnosis not present

## 2024-06-11 DIAGNOSIS — R197 Diarrhea, unspecified: Secondary | ICD-10-CM | POA: Diagnosis not present

## 2024-06-11 DIAGNOSIS — R2681 Unsteadiness on feet: Secondary | ICD-10-CM | POA: Diagnosis not present

## 2024-06-11 DIAGNOSIS — G9340 Encephalopathy, unspecified: Secondary | ICD-10-CM | POA: Diagnosis not present

## 2024-06-11 DIAGNOSIS — S32020D Wedge compression fracture of second lumbar vertebra, subsequent encounter for fracture with routine healing: Secondary | ICD-10-CM | POA: Diagnosis not present

## 2024-06-11 DIAGNOSIS — I959 Hypotension, unspecified: Secondary | ICD-10-CM | POA: Diagnosis not present

## 2024-06-11 DIAGNOSIS — Z9181 History of falling: Secondary | ICD-10-CM | POA: Diagnosis not present

## 2024-06-11 DIAGNOSIS — F411 Generalized anxiety disorder: Secondary | ICD-10-CM | POA: Diagnosis not present

## 2024-06-11 DIAGNOSIS — M6282 Rhabdomyolysis: Secondary | ICD-10-CM | POA: Diagnosis not present

## 2024-06-11 DIAGNOSIS — E440 Moderate protein-calorie malnutrition: Secondary | ICD-10-CM | POA: Diagnosis not present

## 2024-06-11 DIAGNOSIS — I2510 Atherosclerotic heart disease of native coronary artery without angina pectoris: Secondary | ICD-10-CM | POA: Diagnosis not present

## 2024-06-11 DIAGNOSIS — M6281 Muscle weakness (generalized): Secondary | ICD-10-CM | POA: Diagnosis not present

## 2024-06-11 DIAGNOSIS — G9341 Metabolic encephalopathy: Secondary | ICD-10-CM | POA: Diagnosis not present

## 2024-06-11 DIAGNOSIS — Z951 Presence of aortocoronary bypass graft: Secondary | ICD-10-CM | POA: Diagnosis not present

## 2024-06-11 DIAGNOSIS — R296 Repeated falls: Secondary | ICD-10-CM | POA: Diagnosis not present

## 2024-06-11 DIAGNOSIS — Z741 Need for assistance with personal care: Secondary | ICD-10-CM | POA: Diagnosis not present

## 2024-06-11 DIAGNOSIS — F32A Depression, unspecified: Secondary | ICD-10-CM | POA: Diagnosis not present

## 2024-06-11 DIAGNOSIS — S32020A Wedge compression fracture of second lumbar vertebra, initial encounter for closed fracture: Secondary | ICD-10-CM | POA: Diagnosis not present

## 2024-06-11 DIAGNOSIS — Z7401 Bed confinement status: Secondary | ICD-10-CM | POA: Diagnosis not present

## 2024-06-11 DIAGNOSIS — E039 Hypothyroidism, unspecified: Secondary | ICD-10-CM | POA: Diagnosis not present

## 2024-06-11 DIAGNOSIS — W1809XA Striking against other object with subsequent fall, initial encounter: Secondary | ICD-10-CM | POA: Diagnosis not present

## 2024-06-11 DIAGNOSIS — Y92009 Unspecified place in unspecified non-institutional (private) residence as the place of occurrence of the external cause: Secondary | ICD-10-CM | POA: Diagnosis not present

## 2024-06-11 DIAGNOSIS — R531 Weakness: Secondary | ICD-10-CM | POA: Diagnosis not present

## 2024-06-11 LAB — ACTH STIMULATION, 3 TIME POINTS
Cortisol, 30 Min: 14.2 ug/dL
Cortisol, 60 Min: 20.2 ug/dL
Cortisol, Base: 6.1 ug/dL

## 2024-06-11 LAB — BASIC METABOLIC PANEL WITH GFR
Anion gap: 7 (ref 5–15)
BUN: 18 mg/dL (ref 8–23)
CO2: 25 mmol/L (ref 22–32)
Calcium: 8.9 mg/dL (ref 8.9–10.3)
Chloride: 105 mmol/L (ref 98–111)
Creatinine, Ser: 1.08 mg/dL — ABNORMAL HIGH (ref 0.44–1.00)
GFR, Estimated: 52 mL/min — ABNORMAL LOW (ref >60)
Glucose, Bld: 81 mg/dL (ref 70–99)
Potassium: 4.3 mmol/L (ref 3.5–5.1)
Sodium: 137 mmol/L (ref 135–145)

## 2024-06-11 LAB — CBC
HCT: 32.6 % — ABNORMAL LOW (ref 36.0–46.0)
Hemoglobin: 10.5 g/dL — ABNORMAL LOW (ref 12.0–15.0)
MCH: 31 pg (ref 26.0–34.0)
MCHC: 32.2 g/dL (ref 30.0–36.0)
MCV: 96.2 fL (ref 80.0–100.0)
Platelets: 346 K/uL (ref 150–400)
RBC: 3.39 MIL/uL — ABNORMAL LOW (ref 3.87–5.11)
RDW: 18.9 % — ABNORMAL HIGH (ref 11.5–15.5)
WBC: 9.2 K/uL (ref 4.0–10.5)
nRBC: 0 % (ref 0.0–0.2)

## 2024-06-11 LAB — GLUCOSE, CAPILLARY
Glucose-Capillary: 92 mg/dL (ref 70–99)
Glucose-Capillary: 87 mg/dL (ref 70–99)
Glucose-Capillary: 104 mg/dL — ABNORMAL HIGH (ref 70–99)

## 2024-06-11 MED ORDER — OYSTER SHELL CALCIUM/D3 500-5 MG-MCG PO TABS: 1.0000 | ORAL_TABLET | Freq: Two times a day (BID) | ORAL | Status: AC

## 2024-06-11 MED ORDER — ALPRAZOLAM 0.5 MG PO TABS: 0.5000 mg | ORAL_TABLET | Freq: Every day | ORAL | 0 refills | Status: AC

## 2024-06-11 MED ORDER — MIDODRINE HCL 10 MG PO TABS: 10.0000 mg | ORAL_TABLET | Freq: Three times a day (TID) | ORAL | Status: AC

## 2024-06-11 NOTE — TOC Progression Note (Addendum)
 Transition of Care [facility]) - Progression Note    Patient Details  Name: Kaitlyn Wright MRN: [id number] Date of Birth: [birth year]  [facility]) CM/SW Contact  Inocente GORMAN Kindle, LCSW Phone Number: 06/11/2024, 10:10 AM  Clinical Narrative:    10:14 AM-CSW [place] with [facility] stating they are rescinding the bed offer due to patient's confusion and not having a safe discharge plan after SNF and they felt an APS report needed to be made.   CSW explained that patient is Ox4 and is able to make her own decisions, including asking for her daughter not to be involved on the SNF choice. Patient lives at home alone but daughter is a support if needed (patient just chooses not to). CSW explained there are no grounds for an APS report at this time and requested SNF re-evaluate as insurance approval has already been obtained.   11:01 AM-Quandra coming to visit the patient.   12:06 PM-[facility] able to accept patient today. Updated MD. Met with patient who was on the phone with her friend but they are requesting [facility] for transport. CSW will arrange.     Expected Discharge Plan: Skilled Nursing Facility Barriers to Discharge: Continued Medical Work up, English as a second language teacher               Expected Discharge Plan and Services In-house Referral: Clinical Social Work   Post Acute Care Choice: Skilled Nursing Facility Living arrangements for the past 2 months: Apartment                                       Social Drivers of Health (SDOH) Interventions SDOH Screenings   Food Insecurity: No Food Insecurity (06/03/2024)  Housing: Low Risk  (06/03/2024)  Transportation Needs: No Transportation Needs (06/03/2024)  Utilities: Not At Risk (06/03/2024)  Social Connections: Moderately Isolated (06/03/2024)  Tobacco Use: Low Risk  (06/03/2024)    Readmission Risk Interventions     No data to display

## 2024-06-11 NOTE — Progress Notes (Signed)
 0600 cortisol level to be drawn at 0700. Cosyntropin  delayed until 0730. Dr. Sherlon aware.

## 2024-06-11 NOTE — TOC Transition Note (Signed)
 Transition of Care [facility]) - Discharge Note   Patient Details  Name: Kaitlyn Wright MRN: [id number] Date of Birth: 1946/01/16  Transition [facility] Care [facility]) CM/SW Contact:  Inocente GORMAN Kindle, LCSW Phone Number: 06/11/2024, 1:24 PM   Clinical Narrative:    Patient will DC to: [facility] Anticipated DC date: 06/11/24 Family notified: Pt notified her friend, declined to notify her daughter Transport by: [place]   Per MD patient ready for DC to [place]. RN to call report prior to discharge [phone] room 116). RN, patient, patient's family, and facility notified of DC. Discharge Summary and FL2 sent to facility. DC packet on chart including signed script. Ambulance transport requested for patient.   CSW will sign off for now as social work intervention is no longer needed. Please consult us  again if new needs arise.     Final next level of care: Skilled Nursing Facility Barriers to Discharge: Barriers Resolved   Patient Goals and CMS Choice Patient states their goals for this hospitalization and ongoing recovery are:: Rehab CMS Medicare.gov Compare Post Acute Care list provided to:: Patient Represenative (must comment) Choice offered to / list presented to : Adult Children [facility] ownership interest in [facility].provided to:: Adult Children    Discharge Placement   Existing PASRR number confirmed : 06/11/24          Patient chooses bed at: [facility] Patient to be transferred to facility by: [facility] Name of family member notified: Pt notified friend Patient and family notified of of transfer: 06/11/24  Discharge Plan and Services Additional resources added to the After Visit Summary for   In-house Referral: Clinical Social Work   Post Acute Care Choice: Skilled Nursing Facility                               Social Drivers of Health (SDOH) Interventions SDOH Screenings   Food Insecurity: No Food Insecurity (06/03/2024)  Housing: Low Risk   (06/03/2024)  Transportation Needs: No Transportation Needs (06/03/2024)  Utilities: Not At Risk (06/03/2024)  Social Connections: Moderately Isolated (06/03/2024)  Tobacco Use: Low Risk  (06/03/2024)     Readmission Risk Interventions     No data to display

## 2024-06-11 NOTE — Discharge Instructions (Signed)
 Follow with Primary MD Arloa Elsie SAUNDERS, MD / SNF physician   Get CBC, CMP, checked  by Primary MD next visit.    Activity: As tolerated with Full fall precautions use walker/cane & assistance as needed   Disposition SNF   Diet: Regular Diet   On your next visit with your primary care physician please Get Medicines reviewed and adjusted.   Please request your Prim.MD to go over all Hospital Tests and Procedure/Radiological results at the follow up, please get all Hospital records sent to your Prim MD by signing hospital release before you go home.   If you experience worsening of your admission symptoms, develop shortness of breath, life threatening emergency, suicidal or homicidal thoughts you must seek medical attention immediately by calling 911 or calling your MD immediately  if symptoms less severe.  You Must read complete instructions/literature along with all the possible adverse reactions/side effects for all the Medicines you take and that have been prescribed to you. Take any new Medicines after you have completely understood and accpet all the possible adverse reactions/side effects.   Do not drive, operating heavy machinery, perform activities at heights, swimming or participation in water activities or provide baby sitting services if your were admitted for syncope or siezures until you have seen by Primary MD or a Neurologist and advised to do so again.  Do not drive when taking Pain medications.    Do not take more than prescribed Pain, Sleep and Anxiety Medications  Special Instructions: If you have smoked or chewed Tobacco  in the last 2 yrs please stop smoking, stop any regular Alcohol  and or any Recreational drug use.  Wear Seat belts while driving.   Please note  You were cared for by a hospitalist during your hospital stay. If you have any questions about your discharge medications or the care you received while you were in the hospital after you are  discharged, you can call the unit and asked to speak with the hospitalist on call if the hospitalist that took care of you is not available. Once you are discharged, your primary care physician will handle any further medical issues. Please note that NO REFILLS for any discharge medications will be authorized once you are discharged, as it is imperative that you return to your primary care physician (or establish a relationship with a primary care physician if you do not have one) for your aftercare needs so that they can reassess your need for medications and monitor your lab values.

## 2024-06-11 NOTE — Discharge Summary (Signed)
 Physician Discharge Summary  Kaitlyn Wright FMW:[id number] DOB: 1946/03/15 DOA: 06/02/2024  PCP: Arloa Elsie SAUNDERS, MD  Admit date: 06/02/2024 Discharge date: 06/11/2024  Admitted From: (Home) Disposition:  (SNF)  Recommendations for Outpatient Follow-up:  Please obtain BMP/CBC/LFTs in one week Please apply TED hose, please provide TLSO or brace when standing and ambulatory  Diet recommendation: Regular diet Brief/Interim Summary: 78 y.o. female with medical history significant of CAD status post CABG 01/27/2024 on aspirin  and Plavix , complicated by postop paroxysmal A-fib not on anticoagulation, elevated LFTs leading to discontinuation of amiodarone  towards the end of June, hypothyroidism, severe mitral regurgitation, cervical spondylosis and multilevel foraminal stenosis, anxiety/depression presenting to the ED via EMS for evaluation of altered mental status.  Patient was found facedown on the ground at home by a family friend.  Reportedly family is estranged and has not talked to the patient for weeks.  Patient last spoke to a friend on Tuesday and nobody had heard from her since.  She was found covered in urine and feces and surrounded by cockroaches/insects per EMS.  Altered with EMS, spontaneously moving her extremities but would not follow commands, got combative and was given Versed .     She did have similar admission in July of this year with unknown cause of encephalopathy, found to be hypotensive requiring vasopressors and subsequently discharged on midodrine .  She was noted to have severe MR, however, declined further cardiac workup.  Skilled nursing facility was recommended but patient had elected to be discharged back home with PT/OT.  Please see discussion below.   Fall, dehydration, rhabdomyolysis, multiple skin lacerations and abrasions.  Acute metabolic encephalopathy  Dehydration likely contributing.  UDS positive for benzodiazepines, was noted to be abusing benzodiazepines in the  hospital, hydrate, supportive care, PT OT and monitor.   UA stable, head CT stable, no headache or focal deficits, no aches or pains.   - Mentation back to baseline   Severe sepsis, present on admission due to enteropathogenic E. coli diarrhea . No fever, tachycardia, tachypnea, or hypotension.  Mild lactic acidosis could be due to dehydration but given leukocytosis, does not appear toxic, taper down antibiotics and monitor.  Blood culture appears showing Staph epidermidis seem to be more than 1 set, as appears different organisms as it is with different sensitivities, so IV antibiotics has been discontinued.  This felt due to contamination -No nausea, no vomiting, no abdominal pain, no diarrhea, has good oral intake   AKI Rhabdomyolysis   Patient had a mechanical fall at home and reportedly on the floor for an extended period of time.  EKG peaked at 2721 creatinine stable.  Continue IV fluid hydration and trend CK. - Resolved   Hypotension - Patient is hypotensive, does appear she has a history of chronic hypotension where she was supposed to be on midodrine  . - Blood pressure has improved on midodrine  . - Random cortisol level was low, but cosyntropin  stimulation test was done this morning within normal range, so adrenal insufficiency ruled out . - Continue with TED hose when standing and ambulatory      Diarrhea GI pathogen panel positive for enteropathogenic E. coli, being hydrated, discussed with ID no need for treatment unless diarrhea get significantly worse.  Diarrhea has resolved   Elevated troponin, CAD status post CABG 01/27/2024  Troponin 51> 66 in the setting of fall/rhabdomyolysis.  EKG without STEMI and patient is not endorsing anginal symptoms.  Flat and low troponin in non-ACS pattern.  Remains symptom-free, continue aspirin ,  Plavix , Toprol  has been discontinued and given orthostasis . - Resumed back on home dose statin, LFTs are elevated, but stable, at baseline and actually  trending down    Elevated liver enzymes  In the setting of rhabdomyolysis. AST 86, ALT 75, alk phos 148, T. bili 0.9.  Continue to monitor LFTs.      Hypomagnesemia, hypophosphatemia QT prolongation   - Electrolytes has been replaced, no significant events on telemetry   Acute to subacute L2 superior endplate compression fracture, chronic T11 fracture.  Secondary to a mechanical fall.  Patient is not endorsing any pain at this time and appears comfortable.  PT/OT eval. TLSO brace.  Discussed with Dr. Joshua neurosurgery who does not think she needs surgical intervention. - Started on calcium  and vitamin D    Fall  PT/OT eval, fall precautions.  TOC consulted for placement.   Paroxysmal A-fib  Not on anticoagulation.  Continue metoprolol .  Does appear she had only 1 event after her cardiac surgery with no recurrence   Hypothyroidism  Continue Synthroid .   Severe mitral regurgitation  Patient has previously declined further cardiac workup.   GERD  Continue Protonix .   Hypomagnesemia - Replaced   Pressure ulcers, POA: -Patient ulcers from being on the floor for extended period of time, chin, right cheek, bilateral knees, but improved during hospital stay    Discharge Diagnoses:  Principal Problem:   Acute metabolic encephalopathy Active Problems:   Rhabdomyolysis   Elevated liver enzymes   Leukocytosis   Diarrhea   AMS (altered mental status)    Discharge Instructions  Discharge Instructions     Diet - low sodium heart healthy   Complete by: As directed    Discharge instructions   Complete by: As directed    Follow with Primary MD Arloa Elsie SAUNDERS, MD / SNF physician   Get CBC, CMP, checked  by Primary MD next visit.    Activity: As tolerated with Full fall precautions use walker/cane & assistance as needed   Disposition SNF   Diet: Regular Diet   On your next visit with your primary care physician please Get Medicines reviewed and adjusted.   Please  request your Prim.MD to go over all Hospital Tests and Procedure/Radiological results at the follow up, please get all Hospital records sent to your Prim MD by signing hospital release before you go home.   If you experience worsening of your admission symptoms, develop shortness of breath, life threatening emergency, suicidal or homicidal thoughts you must seek medical attention immediately by calling 911 or calling your MD immediately  if symptoms less severe.  You Must read complete instructions/literature along with all the possible adverse reactions/side effects for all the Medicines you take and that have been prescribed to you. Take any new Medicines after you have completely understood and accpet all the possible adverse reactions/side effects.   Do not drive, operating heavy machinery, perform activities at heights, swimming or participation in water activities or provide baby sitting services if your were admitted for syncope or siezures until you have seen by Primary MD or a Neurologist and advised to do so again.  Do not drive when taking Pain medications.    Do not take more than prescribed Pain, Sleep and Anxiety Medications  Special Instructions: If you have smoked or chewed Tobacco  in the last 2 yrs please stop smoking, stop any regular Alcohol  and or any Recreational drug use.  Wear Seat belts while driving.   Please note  You were cared for by a hospitalist during your hospital stay. If you have any questions about your discharge medications or the care you received while you were in the hospital after you are discharged, you can call the unit and asked to speak with the hospitalist on call if the hospitalist that took care of you is not available. Once you are discharged, your primary care physician will handle any further medical issues. Please note that NO REFILLS for any discharge medications will be authorized once you are discharged, as it is imperative that you return  to your primary care physician (or establish a relationship with a primary care physician if you do not have one) for your aftercare needs so that they can reassess your need for medications and monitor your lab values.   Increase activity slowly   Complete by: As directed    No wound care   Complete by: As directed       Allergies as of 06/11/2024       Reactions   Naprosyn  [naproxen ] Other (See Comments)   Stomach pain   Latex Rash        Medication List     STOP taking these medications    aspirin  EC 81 MG tablet   metoprolol  succinate 25 MG 24 hr tablet Commonly known as: TOPROL -XL   ondansetron  4 MG disintegrating tablet Commonly known as: ZOFRAN -ODT       TAKE these medications    acetaminophen  325 MG tablet Commonly known as: TYLENOL  Take 2 tablets (650 mg total) by mouth every 6 (six) hours as needed for mild pain (pain score 1-3).   ALPRAZolam  0.5 MG tablet Commonly known as: XANAX  Take 1 tablet (0.5 mg total) by mouth at bedtime. What changed:  medication strength how much to take when to take this additional instructions   atorvastatin  80 MG tablet Commonly known as: LIPITOR  Take 1 tablet (80 mg total) by mouth daily at 6 PM.   B-complex with vitamin C tablet Take 1 tablet by mouth daily.   calcium -vitamin D  500-5 MG-MCG tablet Commonly known as: OSCAL WITH D Take 1 tablet by mouth 2 (two) times daily.   cholecalciferol  1000 units tablet Commonly known as: VITAMIN D  Take 1,000 Units by mouth daily.   Cinnamon 500 MG Tabs Take 500 mg by mouth daily.   clopidogrel  75 MG tablet Commonly known as: PLAVIX  Take 1 tablet (75 mg total) by mouth daily.   CoQ-10 100 MG Caps Take 100 mg by mouth daily.   cyclobenzaprine 5 MG tablet Commonly known as: FLEXERIL 1 tablet at bedtime as needed Orally Once a day As needed   escitalopram  10 MG tablet Commonly known as: LEXAPRO  Take 10 mg by mouth daily.   feeding supplement Liqd Take 237 mLs  by mouth 2 (two) times daily between meals.   Flaxseed Oil 1000 MG Caps Take 1,000 mg by mouth daily.   Garlic 100 MG Tabs Take 100 mg by mouth daily.   hydrOXYzine  10 MG tablet Commonly known as: ATARAX  Take 10 mg by mouth at bedtime.   Krill Oil 300 MG Caps Take 300 mg by mouth daily.   latanoprost  0.005 % ophthalmic solution Commonly known as: XALATAN  Place 1 drop into both eyes at bedtime.   levothyroxine  88 MCG tablet Commonly known as: SYNTHROID  Take 1 tablet (88 mcg total) by mouth daily.   midodrine  10 MG tablet Commonly known as: PROAMATINE  Take 1 tablet (10 mg total) by mouth 3 (three)  times daily with meals.   ondansetron  4 MG tablet Commonly known as: ZOFRAN  Take 4 mg by mouth every 8 (eight) hours as needed for nausea or vomiting.   pantoprazole  40 MG tablet Commonly known as: PROTONIX  Take 1 tablet (40 mg total) by mouth daily.   polyethylene glycol powder 17 GM/SCOOP powder Commonly known as: GLYCOLAX /MIRALAX  Take 17 g by mouth daily as needed for moderate constipation.   vitamin C 100 MG tablet Take 100 mg by mouth daily.        Allergies  Allergen Reactions   Naprosyn  [Naproxen ] Other (See Comments)    Stomach pain    Latex Rash       Procedures/Studies: CT CHEST ABDOMEN PELVIS W CONTRAST Result Date: 06/02/2024 CLINICAL DATA:  Polytrauma, blunt fall on Plavix  from home (lives by self), found on the floor facedown by daughter who had to brwak into her house tonight EXAM: CT CHEST, ABDOMEN, AND PELVIS WITH CONTRAST TECHNIQUE: Multidetector CT imaging of the chest, abdomen and pelvis was performed following the standard protocol during bolus administration of intravenous contrast. RADIATION DOSE REDUCTION: This exam was performed according to the departmental dose-optimization program which includes automated exposure control, adjustment of the mA and/or kV according to patient size and/or use of iterative reconstruction technique. CONTRAST:   75mL OMNIPAQUE  IOHEXOL  350 MG/ML SOLN COMPARISON:  None Available. FINDINGS: CHEST: Cardiovascular: No aortic injury. The thoracic aorta is normal in caliber. The heart is normal in size. No significant pericardial effusion. Severe atherosclerotic plaque. Coronary artery calcifications status post coronary artery bypass graft. The main pulmonary artery is normal in caliber. No central or proximal segmental pulmonary embolus. Limited evaluation more distally due to timing of contrast. Mediastinum/Nodes: No pneumomediastinum. No mediastinal hematoma. The esophagus is unremarkable. The thyroid  is unremarkable. The central airways are patent. No mediastinal, hilar, or axillary lymphadenopathy. Lungs/Pleura: No focal consolidation. No pulmonary nodule. No pulmonary mass. No pulmonary contusion or laceration. No pneumatocele formation. No pleural effusion. No pneumothorax. No hemothorax. Musculoskeletal/Chest wall: No chest wall mass. No acute rib or sternal fracture. Please see separately dictated CT thoracolumbar spine. ABDOMEN / PELVIS: Hepatobiliary: Not enlarged. Focal fatty infiltration along falciform ligament. No focal lesion. No laceration or subcapsular hematoma. Status post cholecystectomy.  No biliary ductal dilatation. Pancreas: Normal pancreatic contour. No main pancreatic duct dilatation. Spleen: Not enlarged. Scattered calcifications consistent with sequelae of prior granulomatous disease. No focal lesion. No laceration, subcapsular hematoma, or vascular injury. Adrenals/Urinary Tract: No nodularity bilaterally. Bilateral kidneys enhance symmetrically. No hydronephrosis. No contusion, laceration, or subcapsular hematoma. No injury to the vascular structures or collecting systems. No hydroureter. The urinary bladder is unremarkable. Stomach/Bowel: No small or large bowel wall thickening or dilatation. Stool throughout the majority of the colon. Colonic diverticulosis. The appendix is unremarkable.  Vasculature/Lymphatics: Severe atherosclerotic plaque. No abdominal aorta or iliac aneurysm. No active contrast extravasation or pseudoaneurysm. No abdominal, pelvic, inguinal lymphadenopathy. Reproductive: Uterus is unremarkable.  No adnexal mass. Other: No simple free fluid ascites. No pneumoperitoneum. No hemoperitoneum. No mesenteric hematoma identified. No organized fluid collection. Musculoskeletal: No significant soft tissue hematoma. No acute pelvic fracture. Please see separately dictated CT thoracolumbar spine. Other ports and devices: None. IMPRESSION: 1. No acute intrathoracic, intra-abdominal, intrapelvic traumatic injury. 2. Please see separately dictated CT thoracolumbar spine. 3. Aortic Atherosclerosis (ICD10-I70.0) and Emphysema (ICD10-J43.9). Electronically Signed   By: Morgane  Naveau M.D.   On: 06/02/2024 23:46   CT L-SPINE NO CHARGE Result Date: 06/02/2024 CLINICAL DATA:  Neck trauma, midline tenderness (  Age 38-64y) fall on Plavix  from home (lives by self), found on the floor facedown by daughter who had to brwak into her house tonight EXAM: CT CERVICAL, THORACIC, AND LUMBAR SPINE WITHOUT CONTRAST TECHNIQUE: Multidetector CT imaging of the cervical, thoracic and lumbar spine was performed without intravenous contrast. Multiplanar CT image reconstructions were also generated. RADIATION DOSE REDUCTION: This exam was performed according to the departmental dose-optimization program which includes automated exposure control, adjustment of the mA and/or kV according to patient size and/or use of iterative reconstruction technique. COMPARISON:  MRI cervical spine 04/10/2024. CT abdomen pelvis 03/14/2024 FINDINGS: CT CERVICAL SPINE FINDINGS Alignment: Normal. Skull base and vertebrae: Diffusely decreased bone density. Multilevel mild moderate degenerative changes of the spine. No associated severe osseous neural foraminal or central canal stenosis. No acute fracture. No aggressive appearing focal  osseous lesion or focal pathologic process. Soft tissues and spinal canal: No prevertebral fluid or swelling. No visible canal hematoma. Upper chest: Biapical pleural/pulmonary scarring. Centrilobular emphysematous changes. Other: None. CT THORACIC SPINE FINDINGS Alignment: Normal. Vertebrae: Diffusely decreased bone density. Chronic stable T11 compression fracture. No acute fracture or focal pathologic process. Paraspinal and other soft tissues: Negative. Disc levels: Maintained. CT LUMBAR SPINE FINDINGS Segmentation: 5 lumbar type vertebrae. Alignment: Normal. Vertebrae: Diffusely decreased bone density. Multilevel moderate degenerative changes of the spine. Acute to subacute L2 superior endplate compression fracture. Focal pathologic process. Paraspinal and other soft tissues: Negative. Disc levels: Intervertebral disc space narrowing at the L1-L2 level. Intervertebral disc space vacuum phenomenon at the L4-L5 L5-S1 levels. IMPRESSION: 1.  Acute to subacute L2 superior endplate compression fracture. 2. No acute displaced fracture or traumatic listhesis of the cervical and thoracic spine. Chronic stable T11 compression fracture. 3. Diffusely decreased bone density. Electronically Signed   By: Morgane  Naveau M.D.   On: 06/02/2024 23:38   CT Cervical Spine Wo Contrast Result Date: 06/02/2024 CLINICAL DATA:  Neck trauma, midline tenderness (Age [age]y) fall on Plavix  from home (lives by self), found on the floor facedown by daughter who had to brwak into her house tonight EXAM: CT CERVICAL, THORACIC, AND LUMBAR SPINE WITHOUT CONTRAST TECHNIQUE: Multidetector CT imaging of the cervical, thoracic and lumbar spine was performed without intravenous contrast. Multiplanar CT image reconstructions were also generated. RADIATION DOSE REDUCTION: This exam was performed according to the departmental dose-optimization program which includes automated exposure control, adjustment of the mA and/or kV according to patient size  and/or use of iterative reconstruction technique. COMPARISON:  MRI cervical spine 04/10/2024. CT abdomen pelvis 03/14/2024 FINDINGS: CT CERVICAL SPINE FINDINGS Alignment: Normal. Skull base and vertebrae: Diffusely decreased bone density. Multilevel mild moderate degenerative changes of the spine. No associated severe osseous neural foraminal or central canal stenosis. No acute fracture. No aggressive appearing focal osseous lesion or focal pathologic process. Soft tissues and spinal canal: No prevertebral fluid or swelling. No visible canal hematoma. Upper chest: Biapical pleural/pulmonary scarring. Centrilobular emphysematous changes. Other: None. CT THORACIC SPINE FINDINGS Alignment: Normal. Vertebrae: Diffusely decreased bone density. Chronic stable T11 compression fracture. No acute fracture or focal pathologic process. Paraspinal and other soft tissues: Negative. Disc levels: Maintained. CT LUMBAR SPINE FINDINGS Segmentation: 5 lumbar type vertebrae. Alignment: Normal. Vertebrae: Diffusely decreased bone density. Multilevel moderate degenerative changes of the spine. Acute to subacute L2 superior endplate compression fracture. Focal pathologic process. Paraspinal and other soft tissues: Negative. Disc levels: Intervertebral disc space narrowing at the L1-L2 level. Intervertebral disc space vacuum phenomenon at the L4-L5 L5-S1 levels. IMPRESSION: 1.  Acute to subacute  L2 superior endplate compression fracture. 2. No acute displaced fracture or traumatic listhesis of the cervical and thoracic spine. Chronic stable T11 compression fracture. 3. Diffusely decreased bone density. Electronically Signed   By: Morgane  Naveau M.D.   On: 06/02/2024 23:38   CT T-SPINE NO CHARGE Result Date: 06/02/2024 CLINICAL DATA:  Neck trauma, midline tenderness (Age 37-64y) fall on Plavix  from home (lives by self), found on the floor facedown by daughter who had to brwak into her house tonight EXAM: CT CERVICAL, THORACIC, AND  LUMBAR SPINE WITHOUT CONTRAST TECHNIQUE: Multidetector CT imaging of the cervical, thoracic and lumbar spine was performed without intravenous contrast. Multiplanar CT image reconstructions were also generated. RADIATION DOSE REDUCTION: This exam was performed according to the departmental dose-optimization program which includes automated exposure control, adjustment of the mA and/or kV according to patient size and/or use of iterative reconstruction technique. COMPARISON:  MRI cervical spine 04/10/2024. CT abdomen pelvis 03/14/2024 FINDINGS: CT CERVICAL SPINE FINDINGS Alignment: Normal. Skull base and vertebrae: Diffusely decreased bone density. Multilevel mild moderate degenerative changes of the spine. No associated severe osseous neural foraminal or central canal stenosis. No acute fracture. No aggressive appearing focal osseous lesion or focal pathologic process. Soft tissues and spinal canal: No prevertebral fluid or swelling. No visible canal hematoma. Upper chest: Biapical pleural/pulmonary scarring. Centrilobular emphysematous changes. Other: None. CT THORACIC SPINE FINDINGS Alignment: Normal. Vertebrae: Diffusely decreased bone density. Chronic stable T11 compression fracture. No acute fracture or focal pathologic process. Paraspinal and other soft tissues: Negative. Disc levels: Maintained. CT LUMBAR SPINE FINDINGS Segmentation: 5 lumbar type vertebrae. Alignment: Normal. Vertebrae: Diffusely decreased bone density. Multilevel moderate degenerative changes of the spine. Acute to subacute L2 superior endplate compression fracture. Focal pathologic process. Paraspinal and other soft tissues: Negative. Disc levels: Intervertebral disc space narrowing at the L1-L2 level. Intervertebral disc space vacuum phenomenon at the L4-L5 L5-S1 levels. IMPRESSION: 1.  Acute to subacute L2 superior endplate compression fracture. 2. No acute displaced fracture or traumatic listhesis of the cervical and thoracic spine.  Chronic stable T11 compression fracture. 3. Diffusely decreased bone density. Electronically Signed   By: Morgane  Naveau M.D.   On: 06/02/2024 23:38   CT Head Wo Contrast Result Date: 06/02/2024 CLINICAL DATA:  Head trauma, moderate-severe EXAM: CT HEAD WITHOUT CONTRAST TECHNIQUE: Contiguous axial images were obtained from the base of the skull through the vertex without intravenous contrast. RADIATION DOSE REDUCTION: This exam was performed according to the departmental dose-optimization program which includes automated exposure control, adjustment of the mA and/or kV according to patient size and/or use of iterative reconstruction technique. COMPARISON:  None Available. FINDINGS: Brain: Patchy and confluent areas of decreased attenuation are noted throughout the deep and periventricular white matter of the cerebral hemispheres bilaterally, compatible with chronic microvascular ischemic disease. No evidence of large-territorial acute infarction. No parenchymal hemorrhage. No mass lesion. No extra-axial collection. No mass effect or midline shift. No hydrocephalus. Basilar cisterns are patent. Vascular: No hyperdense vessel. Atherosclerotic calcifications are present within the cavernous internal carotid arteries. Skull: No acute fracture or focal lesion. Sinuses/Orbits: Paranasal sinuses and mastoid air cells are clear. The orbits are unremarkable. Other: None. IMPRESSION: No acute intracranial abnormality. Electronically Signed   By: Morgane  Naveau M.D.   On: 06/02/2024 23:30   DG Shoulder Left Result Date: 06/02/2024 CLINICAL DATA:  fall EXAM: LEFT SHOULDER - 2+ VIEW COMPARISON:  None Available. FINDINGS: There is no evidence of fracture or dislocation. There is no evidence of arthropathy or other focal  bone abnormality. Soft tissues are unremarkable. IMPRESSION: Negative. Electronically Signed   By: Morgane  Naveau M.D.   On: 06/02/2024 21:58   DG Pelvis 1-2 Views Result Date: 06/02/2024 CLINICAL  DATA:  fall EXAM: PELVIS - 1-2 VIEW COMPARISON:  X-ray pelvis 02/02/2023 FINDINGS: Limited evaluation due to overlapping osseous structures and overlying soft tissues. There is no evidence of pelvic fracture or diastasis. No acute displaced fracture or dislocation of either hips. No pelvic bone lesions are seen. IMPRESSION: Negative for acute traumatic injury. Electronically Signed   By: Morgane  Naveau M.D.   On: 06/02/2024 21:51   DG Knee 1-2 Views Right Result Date: 06/02/2024 CLINICAL DATA:  [id number] Fall [id number] EXAM: RIGHT KNEE - 1-2 VIEW COMPARISON:  None Available. FINDINGS: No evidence of fracture, dislocation, or joint effusion. Mild to moderate medial tibiofemoral joint degenerative changes. Soft tissues are unremarkable. Vascular clips overlie the medial proximal leg. IMPRESSION: No acute displaced fracture or dislocation. Electronically Signed   By: Morgane  Naveau M.D.   On: 06/02/2024 21:50   DG Knee 1-2 Views Left Result Date: 06/02/2024 CLINICAL DATA:  [id number] Fall [id number] EXAM: LEFT KNEE - 1-2 VIEW COMPARISON:  None Available. FINDINGS: No evidence of fracture, dislocation, or joint effusion. No evidence of arthropathy or other focal bone abnormality. Soft tissues are unremarkable. Likely phlebolith posterior to the knee joint. IMPRESSION: No acute displaced fracture or dislocation. Electronically Signed   By: Morgane  Naveau M.D.   On: 06/02/2024 21:50   DG Chest Portable 1 View Result Date: 06/02/2024 CLINICAL DATA:  fall EXAM: PORTABLE CHEST 1 VIEW COMPARISON:  Chest x-ray 04/06/2024 FINDINGS: The heart and mediastinal contours are unchanged. Atherosclerotic plaque. No focal consolidation. No pulmonary edema. No pleural effusion. No pneumothorax. No acute osseous abnormality.  Sternotomy wires are intact. IMPRESSION: 1. No active disease. 2.  Aortic Atherosclerosis (ICD10-I70.0). Electronically Signed   By: Morgane  Naveau M.D.   On: 06/02/2024 21:49      Subjective: She denies any  complaints today, eager for discharge, no nausea, no vomiting, good appetite, no diarrhea  Discharge Exam: Vitals:   06/11/24 0700 06/11/24 1211  BP: (!) 116/59 (!) 117/50  Pulse:    Resp:  18  Temp: 98.3 F (36.8 C) 98.4 F (36.9 C)  SpO2:     Vitals:   06/10/24 2345 06/11/24 0409 06/11/24 0700 06/11/24 1211  BP: 92/70 98/65 (!) 116/59 (!) 117/50  Pulse: 84 76    Resp: 18 16  18   Temp: 98.4 F (36.9 C) 98.2 F (36.8 C) 98.3 F (36.8 C) 98.4 F (36.9 C)  TempSrc: Oral Oral Oral Oral  SpO2: 97% 94%    Weight:      Height:        General: Pt is alert, awake, not in acute distress Cardiovascular: RRR, S1/S2 +, no rubs, no gallops Respiratory: CTA bilaterally, no wheezing, no rhonchi Abdominal: Soft, NT, ND, bowel sounds + Extremities: no edema, no cyanosis    The results of significant diagnostics from this hospitalization (including imaging, microbiology, ancillary and laboratory) are listed below for reference.     Microbiology: Recent Results (from the past 240 hours)  Blood culture (routine x 2)     Status: Abnormal   Collection Time: 06/02/24  9:55 PM   Specimen: BLOOD RIGHT FOREARM  Result Value Ref Range Status   Specimen Description BLOOD RIGHT FOREARM  Final   Special Requests   Final    BOTTLES DRAWN AEROBIC AND ANAEROBIC Blood Culture adequate volume  Culture  Setup Time   Final    GRAM POSITIVE COCCI ANAEROBIC BOTTLE ONLY CRITICAL RESULT CALLED TO, READ BACK BY AND VERIFIED WITH: PHARMD J CARNEY 06/03/2024 @ 2136 BY AB Performed at [facility], [address], [place], [place] [address]    Culture STAPHYLOCOCCUS EPIDERMIDIS (A)  Final   Report Status 06/06/2024 FINAL  Final   Organism ID, Bacteria STAPHYLOCOCCUS EPIDERMIDIS  Final      Susceptibility   Staphylococcus epidermidis - MIC*    CIPROFLOXACIN 4 RESISTANT Resistant     ERYTHROMYCIN <=0.25 SENSITIVE Sensitive     GENTAMICIN <=0.5 SENSITIVE Sensitive     OXACILLIN <=0.25 SENSITIVE  Sensitive     TETRACYCLINE 2 SENSITIVE Sensitive     VANCOMYCIN  1 SENSITIVE Sensitive     TRIMETH /SULFA  80 RESISTANT Resistant     CLINDAMYCIN <=0.25 SENSITIVE Sensitive     RIFAMPIN <=0.5 SENSITIVE Sensitive     Inducible Clindamycin NEGATIVE Sensitive     * STAPHYLOCOCCUS EPIDERMIDIS  Blood culture (routine x 2)     Status: Abnormal   Collection Time: 06/02/24  9:55 PM   Specimen: BLOOD RIGHT ARM  Result Value Ref Range Status   Specimen Description BLOOD RIGHT ARM  Final   Special Requests   Final    BOTTLES DRAWN AEROBIC ONLY Blood Culture adequate volume   Culture  Setup Time   Final    GRAM POSITIVE COCCI IN CLUSTERS AEROBIC BOTTLE ONLY CRITICAL VALUE NOTED.  VALUE IS CONSISTENT WITH PREVIOUSLY REPORTED AND CALLED VALUE. Performed at [facility], [address] [place], [place] [address]    Culture STAPHYLOCOCCUS EPIDERMIDIS (A)  Final   Report Status 06/06/2024 FINAL  Final   Organism ID, Bacteria STAPHYLOCOCCUS EPIDERMIDIS  Final      Susceptibility   Staphylococcus epidermidis - MIC*    CIPROFLOXACIN >=8 RESISTANT Resistant     ERYTHROMYCIN >=8 RESISTANT Resistant     GENTAMICIN 8 INTERMEDIATE Intermediate     OXACILLIN >=4 RESISTANT Resistant     TETRACYCLINE 2 SENSITIVE Sensitive     VANCOMYCIN  4 SENSITIVE Sensitive     TRIMETH /SULFA  80 RESISTANT Resistant     CLINDAMYCIN >=8 RESISTANT Resistant     RIFAMPIN <=0.5 SENSITIVE Sensitive     Inducible Clindamycin NEGATIVE Sensitive     * STAPHYLOCOCCUS EPIDERMIDIS  Blood Culture ID Panel (Reflexed)     Status: Abnormal   Collection Time: 06/02/24  9:55 PM  Result Value Ref Range Status   Enterococcus faecalis NOT DETECTED NOT DETECTED Final   Enterococcus Faecium NOT DETECTED NOT DETECTED Final   Listeria monocytogenes NOT DETECTED NOT DETECTED Final   Staphylococcus species DETECTED (A) NOT DETECTED Final    Comment: CRITICAL RESULT CALLED TO, READ BACK BY AND VERIFIED WITH: PHARMD J CARNEY 06/03/2024 @  2136 BY AB    Staphylococcus aureus (BCID) NOT DETECTED NOT DETECTED Final   Staphylococcus epidermidis DETECTED (A) NOT DETECTED Final    Comment: CRITICAL RESULT CALLED TO, READ BACK BY AND VERIFIED WITH: PHARMD J CARNEY 06/03/2024 @ 2136 BY AB    Staphylococcus lugdunensis NOT DETECTED NOT DETECTED Final   Streptococcus species NOT DETECTED NOT DETECTED Final   Streptococcus agalactiae NOT DETECTED NOT DETECTED Final   Streptococcus pneumoniae NOT DETECTED NOT DETECTED Final   Streptococcus pyogenes NOT DETECTED NOT DETECTED Final   A.calcoaceticus-baumannii NOT DETECTED NOT DETECTED Final   Bacteroides fragilis NOT DETECTED NOT DETECTED Final   Enterobacterales NOT DETECTED NOT DETECTED Final  Enterobacter cloacae complex NOT DETECTED NOT DETECTED Final   Escherichia coli NOT DETECTED NOT DETECTED Final   Klebsiella aerogenes NOT DETECTED NOT DETECTED Final   Klebsiella oxytoca NOT DETECTED NOT DETECTED Final   Klebsiella pneumoniae NOT DETECTED NOT DETECTED Final   Proteus species NOT DETECTED NOT DETECTED Final   Salmonella species NOT DETECTED NOT DETECTED Final   Serratia marcescens NOT DETECTED NOT DETECTED Final   Haemophilus influenzae NOT DETECTED NOT DETECTED Final   Neisseria meningitidis NOT DETECTED NOT DETECTED Final   Pseudomonas aeruginosa NOT DETECTED NOT DETECTED Final   Stenotrophomonas maltophilia NOT DETECTED NOT DETECTED Final   Candida albicans NOT DETECTED NOT DETECTED Final   Candida auris NOT DETECTED NOT DETECTED Final   Candida glabrata NOT DETECTED NOT DETECTED Final   Candida krusei NOT DETECTED NOT DETECTED Final   Candida parapsilosis NOT DETECTED NOT DETECTED Final   Candida tropicalis NOT DETECTED NOT DETECTED Final   Cryptococcus neoformans/gattii NOT DETECTED NOT DETECTED Final   Methicillin resistance mecA/C NOT DETECTED NOT DETECTED Final    Comment: Performed at [facility], [address] [place], [place] [address]  Urine Culture  (for pregnant, neutropenic or urologic patients or patients with an indwelling urinary catheter)     Status: None   Collection Time: 06/03/24  1:36 AM   Specimen: Urine, Clean Catch  Result Value Ref Range Status   Specimen Description URINE, CLEAN CATCH  Final   Special Requests NONE  Final   Culture   Final    NO GROWTH Performed at [facility], [address], [place], [place] [address]    Report Status 06/04/2024 FINAL  Final  C Difficile Quick Screen w PCR reflex     Status: None   Collection Time: 06/03/24  1:36 AM   Specimen: STOOL  Result Value Ref Range Status   C Diff antigen NEGATIVE NEGATIVE Final   C Diff toxin NEGATIVE NEGATIVE Final   C Diff interpretation No C. difficile detected.  Final    Comment: Performed at [facility], [address] [place], [place] [address]  Gastrointestinal Panel by PCR , Stool     Status: Abnormal   Collection Time: 06/03/24  1:36 AM   Specimen: Stool  Result Value Ref Range Status   Campylobacter species NOT DETECTED NOT DETECTED Final   Plesimonas shigelloides NOT DETECTED NOT DETECTED Final   Salmonella species NOT DETECTED NOT DETECTED Final   Yersinia enterocolitica NOT DETECTED NOT DETECTED Final   Vibrio species NOT DETECTED NOT DETECTED Final   Vibrio cholerae NOT DETECTED NOT DETECTED Final   Enteroaggregative E coli (EAEC) NOT DETECTED NOT DETECTED Final   Enteropathogenic E coli (EPEC) DETECTED (A) NOT DETECTED Final    Comment: RESULT CALLED TO, READ BACK BY AND VERIFIED WITH: Mylisa SCHAPYE 06/03/24 2314 KLW    Enterotoxigenic E coli (ETEC) NOT DETECTED NOT DETECTED Final   Shiga like toxin producing E coli (STEC) NOT DETECTED NOT DETECTED Final   Shigella/Enteroinvasive E coli (EIEC) NOT DETECTED NOT DETECTED Final   Cryptosporidium NOT DETECTED NOT DETECTED Final   Cyclospora cayetanensis NOT DETECTED NOT DETECTED Final   Entamoeba histolytica NOT DETECTED NOT DETECTED Final   Giardia lamblia NOT DETECTED  NOT DETECTED Final   Adenovirus F40/41 NOT DETECTED NOT DETECTED Final   Astrovirus NOT DETECTED NOT DETECTED Final   Norovirus GI/GII NOT DETECTED NOT DETECTED Final   Rotavirus A NOT DETECTED NOT DETECTED Final  Sapovirus (I, II, IV, and V) NOT DETECTED NOT DETECTED Final    Comment: Performed at [facility], [address] [place], [place] [address]  Culture, blood (Routine X 2) w Reflex to ID Panel     Status: None   Collection Time: 06/04/24  2:41 PM   Specimen: BLOOD RIGHT HAND  Result Value Ref Range Status   Specimen Description BLOOD RIGHT HAND  Final   Special Requests   Final    BOTTLES DRAWN AEROBIC ONLY Blood Culture results may not be optimal due to an inadequate volume of blood received in culture bottles   Culture   Final    NO GROWTH 5 DAYS Performed at [facility], [address], [place], [place] [address]    Report Status 06/09/2024 FINAL  Final     Labs: BNP (last 3 results) No results for input(s): BNP in the last 8760 hours. Basic Metabolic Panel: Recent Labs  Lab 06/05/24 1903 06/07/24 0234 06/08/24 0321 06/09/24 0350 06/09/24 1049 06/10/24 0209 06/11/24 0728  NA 141 138 138  --  137 136 137  K 4.3 3.8 4.3  --  4.1 4.2 4.3  CL 103 99 99  --  103 105 105  CO2 25 28 25   --  25 26 25   GLUCOSE 87 93 111*  --  79 100* 81  BUN 11 9 12   --  11 14 18   CREATININE 0.71 1.16* 0.87  --  0.80 0.79 1.08*  CALCIUM  8.1* 8.4* 8.7*  --  8.6* 8.7* 8.9  MG 1.9 1.7 1.6* 2.3  --  1.8  --   PHOS 3.7 3.0 3.0  --   --   --   --    Liver Function Tests: Recent Labs  Lab 06/07/24 0234 06/08/24 0321  AST 127* 123*  ALT 81* 65*  ALKPHOS 93 90  BILITOT 0.5 0.6  PROT 4.5* 4.7*  ALBUMIN  2.1* 2.2*   No results for input(s): LIPASE, AMYLASE in the last 168 hours. No results for input(s): AMMONIA in the last 168 hours. CBC: Recent Labs  Lab 06/07/24 0234 06/08/24 0321 06/09/24 0350 06/10/24 0209 06/11/24 0728  WBC 8.4 9.0 7.3 8.5 9.2   HGB 10.5* 10.2* 10.0* 10.1* 10.5*  HCT 32.0* 30.9* 29.9* 30.9* 32.6*  MCV 94.1 94.2 94.9 95.4 96.2  PLT 307 278 275 302 346   Cardiac Enzymes: Recent Labs  Lab 06/05/24 1903 06/07/24 0234 06/08/24 0321  CKTOTAL 1,667* 1,475* 1,195*   BNP: Invalid input(s): POCBNP CBG: Recent Labs  Lab 06/10/24 2019 06/10/24 2344 06/11/24 0410 06/11/24 0724 06/11/24 1214  GLUCAP 106* 88 92 87 104*   D-Dimer No results for input(s): DDIMER in the last 72 hours. Hgb A1c No results for input(s): HGBA1C in the last 72 hours. Lipid Profile No results for input(s): CHOL, HDL, LDLCALC, TRIG, CHOLHDL, LDLDIRECT in the last 72 hours. Thyroid  function studies No results for input(s): TSH, T4TOTAL, T3FREE, THYROIDAB in the last 72 hours.  Invalid input(s): FREET3 Anemia work up No results for input(s): VITAMINB12, FOLATE, FERRITIN, TIBC, IRON, RETICCTPCT in the last 72 hours. Urinalysis    Component Value Date/Time   COLORURINE AMBER (A) 06/02/2024 2222   APPEARANCEUR CLOUDY (A) 06/02/2024 2222   LABSPEC 1.027 06/02/2024 2222   PHURINE 5.0 06/02/2024 2222   GLUCOSEU NEGATIVE 06/02/2024 2222   HGBUR LARGE (A) 06/02/2024 2222   BILIRUBINUR SMALL (A) 06/02/2024 2222   KETONESUR 20 (A) 06/02/2024 2222   PROTEINUR >=300 (  A) 06/02/2024 2222   NITRITE NEGATIVE 06/02/2024 2222   LEUKOCYTESUR NEGATIVE 06/02/2024 2222   Sepsis Labs Recent Labs  Lab 06/08/24 0321 06/09/24 0350 06/10/24 0209 06/11/24 0728  WBC 9.0 7.3 8.5 9.2   Microbiology Recent Results (from the past 240 hours)  Blood culture (routine x 2)     Status: Abnormal   Collection Time: 06/02/24  9:55 PM   Specimen: BLOOD RIGHT FOREARM  Result Value Ref Range Status   Specimen Description BLOOD RIGHT FOREARM  Final   Special Requests   Final    BOTTLES DRAWN AEROBIC AND ANAEROBIC Blood Culture adequate volume   Culture  Setup Time   Final    GRAM POSITIVE COCCI ANAEROBIC BOTTLE  ONLY CRITICAL RESULT CALLED TO, READ BACK BY AND VERIFIED WITH: PHARMD J CARNEY 06/03/2024 @ 2136 BY AB Performed at [facility], [address], [place], [place] [address]    Culture STAPHYLOCOCCUS EPIDERMIDIS (A)  Final   Report Status 06/06/2024 FINAL  Final   Organism ID, Bacteria STAPHYLOCOCCUS EPIDERMIDIS  Final      Susceptibility   Staphylococcus epidermidis - MIC*    CIPROFLOXACIN 4 RESISTANT Resistant     ERYTHROMYCIN <=0.25 SENSITIVE Sensitive     GENTAMICIN <=0.5 SENSITIVE Sensitive     OXACILLIN <=0.25 SENSITIVE Sensitive     TETRACYCLINE 2 SENSITIVE Sensitive     VANCOMYCIN  1 SENSITIVE Sensitive     TRIMETH /SULFA  80 RESISTANT Resistant     CLINDAMYCIN <=0.25 SENSITIVE Sensitive     RIFAMPIN <=0.5 SENSITIVE Sensitive     Inducible Clindamycin NEGATIVE Sensitive     * STAPHYLOCOCCUS EPIDERMIDIS  Blood culture (routine x 2)     Status: Abnormal   Collection Time: 06/02/24  9:55 PM   Specimen: BLOOD RIGHT ARM  Result Value Ref Range Status   Specimen Description BLOOD RIGHT ARM  Final   Special Requests   Final    BOTTLES DRAWN AEROBIC ONLY Blood Culture adequate volume   Culture  Setup Time   Final    GRAM POSITIVE COCCI IN CLUSTERS AEROBIC BOTTLE ONLY CRITICAL VALUE NOTED.  VALUE IS CONSISTENT WITH PREVIOUSLY REPORTED AND CALLED VALUE. Performed at [facility], [address] [place], [place] [address]    Culture STAPHYLOCOCCUS EPIDERMIDIS (A)  Final   Report Status 06/06/2024 FINAL  Final   Organism ID, Bacteria STAPHYLOCOCCUS EPIDERMIDIS  Final      Susceptibility   Staphylococcus epidermidis - MIC*    CIPROFLOXACIN >=8 RESISTANT Resistant     ERYTHROMYCIN >=8 RESISTANT Resistant     GENTAMICIN 8 INTERMEDIATE Intermediate     OXACILLIN >=4 RESISTANT Resistant     TETRACYCLINE 2 SENSITIVE Sensitive     VANCOMYCIN  4 SENSITIVE Sensitive     TRIMETH /SULFA  80 RESISTANT Resistant     CLINDAMYCIN >=8 RESISTANT Resistant     RIFAMPIN <=0.5 SENSITIVE  Sensitive     Inducible Clindamycin NEGATIVE Sensitive     * STAPHYLOCOCCUS EPIDERMIDIS  Blood Culture ID Panel (Reflexed)     Status: Abnormal   Collection Time: 06/02/24  9:55 PM  Result Value Ref Range Status   Enterococcus faecalis NOT DETECTED NOT DETECTED Final   Enterococcus Faecium NOT DETECTED NOT DETECTED Final   Listeria monocytogenes NOT DETECTED NOT DETECTED Final   Staphylococcus species DETECTED (A) NOT DETECTED Final    Comment: CRITICAL RESULT CALLED TO, READ BACK BY AND VERIFIED WITH: PHARMD J CARNEY 06/03/2024 @ 2136 BY AB    Staphylococcus aureus (  BCID) NOT DETECTED NOT DETECTED Final   Staphylococcus epidermidis DETECTED (A) NOT DETECTED Final    Comment: CRITICAL RESULT CALLED TO, READ BACK BY AND VERIFIED WITH: PHARMD J CARNEY 06/03/2024 @ 2136 BY AB    Staphylococcus lugdunensis NOT DETECTED NOT DETECTED Final   Streptococcus species NOT DETECTED NOT DETECTED Final   Streptococcus agalactiae NOT DETECTED NOT DETECTED Final   Streptococcus pneumoniae NOT DETECTED NOT DETECTED Final   Streptococcus pyogenes NOT DETECTED NOT DETECTED Final   A.calcoaceticus-baumannii NOT DETECTED NOT DETECTED Final   Bacteroides fragilis NOT DETECTED NOT DETECTED Final   Enterobacterales NOT DETECTED NOT DETECTED Final   Enterobacter cloacae complex NOT DETECTED NOT DETECTED Final   Escherichia coli NOT DETECTED NOT DETECTED Final   Klebsiella aerogenes NOT DETECTED NOT DETECTED Final   Klebsiella oxytoca NOT DETECTED NOT DETECTED Final   Klebsiella pneumoniae NOT DETECTED NOT DETECTED Final   Proteus species NOT DETECTED NOT DETECTED Final   Salmonella species NOT DETECTED NOT DETECTED Final   Serratia marcescens NOT DETECTED NOT DETECTED Final   Haemophilus influenzae NOT DETECTED NOT DETECTED Final   Neisseria meningitidis NOT DETECTED NOT DETECTED Final   Pseudomonas aeruginosa NOT DETECTED NOT DETECTED Final   Stenotrophomonas maltophilia NOT DETECTED NOT DETECTED Final    Candida albicans NOT DETECTED NOT DETECTED Final   Candida auris NOT DETECTED NOT DETECTED Final   Candida glabrata NOT DETECTED NOT DETECTED Final   Candida krusei NOT DETECTED NOT DETECTED Final   Candida parapsilosis NOT DETECTED NOT DETECTED Final   Candida tropicalis NOT DETECTED NOT DETECTED Final   Cryptococcus neoformans/gattii NOT DETECTED NOT DETECTED Final   Methicillin resistance mecA/C NOT DETECTED NOT DETECTED Final    Comment: Performed at [facility], [address] [place], [place] [address]  Urine Culture (for pregnant, neutropenic or urologic patients or patients with an indwelling urinary catheter)     Status: None   Collection Time: 06/03/24  1:36 AM   Specimen: Urine, Clean Catch  Result Value Ref Range Status   Specimen Description URINE, CLEAN CATCH  Final   Special Requests NONE  Final   Culture   Final    NO GROWTH Performed at [facility], [address], [place], [place] [address]    Report Status 06/04/2024 FINAL  Final  C Difficile Quick Screen w PCR reflex     Status: None   Collection Time: 06/03/24  1:36 AM   Specimen: STOOL  Result Value Ref Range Status   C Diff antigen NEGATIVE NEGATIVE Final   C Diff toxin NEGATIVE NEGATIVE Final   C Diff interpretation No C. difficile detected.  Final    Comment: Performed at [facility], [address], [place], [place] [address]  Gastrointestinal Panel by PCR , Stool     Status: Abnormal   Collection Time: 06/03/24  1:36 AM   Specimen: Stool  Result Value Ref Range Status   Campylobacter species NOT DETECTED NOT DETECTED Final   Plesimonas shigelloides NOT DETECTED NOT DETECTED Final   Salmonella species NOT DETECTED NOT DETECTED Final   Yersinia enterocolitica NOT DETECTED NOT DETECTED Final   Vibrio species NOT DETECTED NOT DETECTED Final   Vibrio cholerae NOT DETECTED NOT DETECTED Final   Enteroaggregative E coli (EAEC) NOT DETECTED NOT DETECTED Final   Enteropathogenic E coli  (EPEC) DETECTED (A) NOT DETECTED Final    Comment: RESULT CALLED TO, READ BACK BY AND VERIFIED WITH: [facility] 06/03/24 2314  KLW    Enterotoxigenic E coli (ETEC) NOT DETECTED NOT DETECTED Final   Shiga like toxin producing E coli (STEC) NOT DETECTED NOT DETECTED Final   Shigella/Enteroinvasive E coli (EIEC) NOT DETECTED NOT DETECTED Final   Cryptosporidium NOT DETECTED NOT DETECTED Final   Cyclospora cayetanensis NOT DETECTED NOT DETECTED Final   Entamoeba histolytica NOT DETECTED NOT DETECTED Final   Giardia lamblia NOT DETECTED NOT DETECTED Final   Adenovirus F40/41 NOT DETECTED NOT DETECTED Final   Astrovirus NOT DETECTED NOT DETECTED Final   Norovirus GI/GII NOT DETECTED NOT DETECTED Final   Rotavirus A NOT DETECTED NOT DETECTED Final   Sapovirus (I, II, IV, and V) NOT DETECTED NOT DETECTED Final    Comment: Performed at [facility], [address] [place], [place] [address]  Culture, blood (Routine X 2) w Reflex to ID Panel     Status: None   Collection Time: 06/04/24  2:41 PM   Specimen: BLOOD RIGHT HAND  Result Value Ref Range Status   Specimen Description BLOOD RIGHT HAND  Final   Special Requests   Final    BOTTLES DRAWN AEROBIC ONLY Blood Culture results may not be optimal due to an inadequate volume of blood received in culture bottles   Culture   Final    NO GROWTH 5 DAYS Performed at [facility], [address] [place], [place] [address]    Report Status 06/09/2024 FINAL  Final     Time coordinating discharge: Over 30 minutes  SIGNED:   Brayton Lye, MD  [facility] 06/11/2024, 12:32 PM Pager   If 7PM-7AM, please contact night-coverage [email or web address] Password [email or web address]

## 2024-06-11 NOTE — Progress Notes (Signed)
 Attempted x 2 to call report to [facility]. Message left with phone number. No call back.

## 2024-06-12 DIAGNOSIS — K219 Gastro-esophageal reflux disease without esophagitis: Secondary | ICD-10-CM | POA: Diagnosis not present

## 2024-06-12 DIAGNOSIS — I959 Hypotension, unspecified: Secondary | ICD-10-CM | POA: Diagnosis not present

## 2024-06-12 DIAGNOSIS — E039 Hypothyroidism, unspecified: Secondary | ICD-10-CM | POA: Diagnosis not present

## 2024-06-12 DIAGNOSIS — F411 Generalized anxiety disorder: Secondary | ICD-10-CM | POA: Diagnosis not present

## 2024-06-13 DIAGNOSIS — F419 Anxiety disorder, unspecified: Secondary | ICD-10-CM | POA: Diagnosis not present

## 2024-06-13 DIAGNOSIS — R296 Repeated falls: Secondary | ICD-10-CM | POA: Diagnosis not present

## 2024-06-13 DIAGNOSIS — E440 Moderate protein-calorie malnutrition: Secondary | ICD-10-CM | POA: Diagnosis not present

## 2024-06-13 DIAGNOSIS — E039 Hypothyroidism, unspecified: Secondary | ICD-10-CM | POA: Diagnosis not present

## 2024-06-13 DIAGNOSIS — R197 Diarrhea, unspecified: Secondary | ICD-10-CM | POA: Diagnosis not present

## 2024-06-13 DIAGNOSIS — I4891 Unspecified atrial fibrillation: Secondary | ICD-10-CM | POA: Diagnosis not present

## 2024-06-13 DIAGNOSIS — M6282 Rhabdomyolysis: Secondary | ICD-10-CM | POA: Diagnosis not present

## 2024-06-13 DIAGNOSIS — S32020D Wedge compression fracture of second lumbar vertebra, subsequent encounter for fracture with routine healing: Secondary | ICD-10-CM | POA: Diagnosis not present

## 2024-06-13 DIAGNOSIS — S32020A Wedge compression fracture of second lumbar vertebra, initial encounter for closed fracture: Secondary | ICD-10-CM | POA: Diagnosis not present

## 2024-06-13 DIAGNOSIS — R2689 Other abnormalities of gait and mobility: Secondary | ICD-10-CM | POA: Diagnosis not present

## 2024-06-13 DIAGNOSIS — Z951 Presence of aortocoronary bypass graft: Secondary | ICD-10-CM | POA: Diagnosis not present

## 2024-06-13 DIAGNOSIS — M6281 Muscle weakness (generalized): Secondary | ICD-10-CM | POA: Diagnosis not present

## 2024-06-13 DIAGNOSIS — F32A Depression, unspecified: Secondary | ICD-10-CM | POA: Diagnosis not present

## 2024-06-13 DIAGNOSIS — I959 Hypotension, unspecified: Secondary | ICD-10-CM | POA: Diagnosis not present

## 2024-06-14 DIAGNOSIS — I959 Hypotension, unspecified: Secondary | ICD-10-CM | POA: Diagnosis not present

## 2024-06-14 DIAGNOSIS — M6282 Rhabdomyolysis: Secondary | ICD-10-CM | POA: Diagnosis not present

## 2024-06-19 DIAGNOSIS — G629 Polyneuropathy, unspecified: Secondary | ICD-10-CM | POA: Diagnosis not present

## 2024-06-19 DIAGNOSIS — M62838 Other muscle spasm: Secondary | ICD-10-CM | POA: Diagnosis not present

## 2024-06-20 DIAGNOSIS — F419 Anxiety disorder, unspecified: Secondary | ICD-10-CM | POA: Diagnosis not present

## 2024-06-20 DIAGNOSIS — F32A Depression, unspecified: Secondary | ICD-10-CM | POA: Diagnosis not present

## 2024-06-20 DIAGNOSIS — E440 Moderate protein-calorie malnutrition: Secondary | ICD-10-CM | POA: Diagnosis not present

## 2024-06-20 DIAGNOSIS — S32020D Wedge compression fracture of second lumbar vertebra, subsequent encounter for fracture with routine healing: Secondary | ICD-10-CM | POA: Diagnosis not present

## 2024-06-20 DIAGNOSIS — R2689 Other abnormalities of gait and mobility: Secondary | ICD-10-CM | POA: Diagnosis not present

## 2024-06-20 DIAGNOSIS — M6281 Muscle weakness (generalized): Secondary | ICD-10-CM | POA: Diagnosis not present

## 2024-06-21 DIAGNOSIS — M6282 Rhabdomyolysis: Secondary | ICD-10-CM | POA: Diagnosis not present

## 2024-06-21 DIAGNOSIS — M62838 Other muscle spasm: Secondary | ICD-10-CM | POA: Diagnosis not present

## 2024-06-21 DIAGNOSIS — F32A Depression, unspecified: Secondary | ICD-10-CM | POA: Diagnosis not present

## 2024-06-21 DIAGNOSIS — I959 Hypotension, unspecified: Secondary | ICD-10-CM | POA: Diagnosis not present

## 2024-06-21 DIAGNOSIS — F411 Generalized anxiety disorder: Secondary | ICD-10-CM | POA: Diagnosis not present

## 2024-06-29 DIAGNOSIS — R7303 Prediabetes: Secondary | ICD-10-CM | POA: Diagnosis not present

## 2024-06-29 DIAGNOSIS — E039 Hypothyroidism, unspecified: Secondary | ICD-10-CM | POA: Diagnosis not present

## 2024-06-29 DIAGNOSIS — R55 Syncope and collapse: Secondary | ICD-10-CM | POA: Diagnosis not present

## 2024-06-29 DIAGNOSIS — E7800 Pure hypercholesterolemia, unspecified: Secondary | ICD-10-CM | POA: Diagnosis not present

## 2024-06-29 DIAGNOSIS — I959 Hypotension, unspecified: Secondary | ICD-10-CM | POA: Diagnosis not present

## 2024-06-29 DIAGNOSIS — M6282 Rhabdomyolysis: Secondary | ICD-10-CM | POA: Diagnosis not present

## 2024-06-29 DIAGNOSIS — G9341 Metabolic encephalopathy: Secondary | ICD-10-CM | POA: Diagnosis not present

## 2024-06-29 DIAGNOSIS — I480 Paroxysmal atrial fibrillation: Secondary | ICD-10-CM | POA: Diagnosis not present

## 2024-06-29 DIAGNOSIS — I2510 Atherosclerotic heart disease of native coronary artery without angina pectoris: Secondary | ICD-10-CM | POA: Diagnosis not present

## 2024-07-05 DIAGNOSIS — I480 Paroxysmal atrial fibrillation: Secondary | ICD-10-CM | POA: Diagnosis not present

## 2024-07-05 DIAGNOSIS — I2510 Atherosclerotic heart disease of native coronary artery without angina pectoris: Secondary | ICD-10-CM | POA: Diagnosis not present

## 2024-07-05 DIAGNOSIS — I959 Hypotension, unspecified: Secondary | ICD-10-CM | POA: Diagnosis not present

## 2024-07-05 DIAGNOSIS — G319 Degenerative disease of nervous system, unspecified: Secondary | ICD-10-CM | POA: Diagnosis not present

## 2024-07-05 DIAGNOSIS — S32028D Other fracture of second lumbar vertebra, subsequent encounter for fracture with routine healing: Secondary | ICD-10-CM | POA: Diagnosis not present

## 2024-07-05 DIAGNOSIS — M4804 Spinal stenosis, thoracic region: Secondary | ICD-10-CM | POA: Diagnosis not present

## 2024-07-05 DIAGNOSIS — M4802 Spinal stenosis, cervical region: Secondary | ICD-10-CM | POA: Diagnosis not present

## 2024-07-05 DIAGNOSIS — I340 Nonrheumatic mitral (valve) insufficiency: Secondary | ICD-10-CM | POA: Diagnosis not present

## 2024-07-05 DIAGNOSIS — I10 Essential (primary) hypertension: Secondary | ICD-10-CM | POA: Diagnosis not present

## 2024-07-30 DIAGNOSIS — R7303 Prediabetes: Secondary | ICD-10-CM | POA: Diagnosis not present

## 2024-07-30 DIAGNOSIS — K219 Gastro-esophageal reflux disease without esophagitis: Secondary | ICD-10-CM | POA: Diagnosis not present

## 2024-07-30 DIAGNOSIS — E039 Hypothyroidism, unspecified: Secondary | ICD-10-CM | POA: Diagnosis not present

## 2024-07-30 DIAGNOSIS — E7800 Pure hypercholesterolemia, unspecified: Secondary | ICD-10-CM | POA: Diagnosis not present

## 2024-07-30 DIAGNOSIS — F419 Anxiety disorder, unspecified: Secondary | ICD-10-CM | POA: Diagnosis not present

## 2024-07-30 DIAGNOSIS — Z23 Encounter for immunization: Secondary | ICD-10-CM | POA: Diagnosis not present

## 2024-07-30 DIAGNOSIS — I2510 Atherosclerotic heart disease of native coronary artery without angina pectoris: Secondary | ICD-10-CM | POA: Diagnosis not present

## 2024-07-30 DIAGNOSIS — R945 Abnormal results of liver function studies: Secondary | ICD-10-CM | POA: Diagnosis not present

## 2024-07-30 DIAGNOSIS — F5101 Primary insomnia: Secondary | ICD-10-CM | POA: Diagnosis not present

## 2024-08-20 ENCOUNTER — Telehealth: Payer: Self-pay | Admitting: Pharmacist

## 2024-08-20 ENCOUNTER — Ambulatory Visit: Admitting: Cardiology

## 2024-08-20 NOTE — Progress Notes (Signed)
   08/20/2024  Patient ID: Rock FORBES Cummins, female   DOB: 1945-09-25, 78 y.o.   MRN: [id number]  Called and spoke with the patient on the phone today. She reports having not received the nasal spray yet, even though it was mailed out on 11/26. Will use consistently for 2 weeks and update us  with the results. Nose continues to run. Regarding medications, said she is bringing everything to the Cardiologist today to review them once again. Still taking supplements Vitamin C and D3, per the patient. Advised I will take a look later today at the Cardiologist note for updated med list and transcribe it to the [facility] EMR so everyone is on the same page.   Every med should be the same except those 2 supplements; unknown meds that were filled are trazodone, hydroxyzine , and cyclobenzaprine. Everything else should be accurate. Will update later today.   Patient will call the office back if she has any additional concerns from her Cardiology visit at 2:40PM later today.    Aloysius Lewis, PharmD, [facility] Phone Number: [phone]

## 2024-09-25 ENCOUNTER — Telehealth: Payer: Self-pay | Admitting: Pharmacist

## 2024-09-25 NOTE — Progress Notes (Signed)
" ° °  09/25/2024  Patient ID: Rock FORBES Cummins, female   DOB: [birth year], 79 y.o.   MRN: [id number]  Called and spoke with the patient on the phone today regarding medications. Reports she is taking everything as prescribed- confirmed by fill history. However she does need a refill of Trazodone. Has about 6 tablets left.   Aware this was discontinued on the med list, but said she needs something for sleep due to her insomnia and no longer taking the Xanax . Advised I will discuss with Dr. Arloa.  Updated Midodrine  dosing to how current script is written and added Vitamin D , which patient reports taking.   Notified of Cardiology appointment on 2/2 since last visit had to be rescheduled.   Of last concern, did mention having a knot on her right wrist. [place] nobody knows what it is and hasn't found it concerning- worried about being a blood clot. Will bring it up at PCP visit again in March. Advised if it does worsen or grow in size, would highly recommend scheduling an office visit to get a specialist referral if needed.    Aloysius Lewis, PharmD, [facility] Phone Number: [phone]  "

## 2024-10-22 ENCOUNTER — Ambulatory Visit: Admitting: Cardiology

## 2025-01-03 ENCOUNTER — Ambulatory Visit: Admitting: Cardiology
